# Patient Record
Sex: Male | Born: 1960 | Race: White | Hispanic: No | Marital: Married | Smoking: Current every day smoker
Health system: Southern US, Community
[De-identification: ages and names within clinical notes are randomized; demographics above are authoritative.]

## PROBLEM LIST (undated history)

## (undated) DIAGNOSIS — G8929 Other chronic pain: Secondary | ICD-10-CM

## (undated) DIAGNOSIS — F419 Anxiety disorder, unspecified: Secondary | ICD-10-CM

## (undated) DIAGNOSIS — M519 Unspecified thoracic, thoracolumbar and lumbosacral intervertebral disc disorder: Secondary | ICD-10-CM

## (undated) DIAGNOSIS — I10 Essential (primary) hypertension: Secondary | ICD-10-CM

## (undated) DIAGNOSIS — E785 Hyperlipidemia, unspecified: Secondary | ICD-10-CM

## (undated) DIAGNOSIS — F17210 Nicotine dependence, cigarettes, uncomplicated: Secondary | ICD-10-CM

## (undated) DIAGNOSIS — Z95828 Presence of other vascular implants and grafts: Secondary | ICD-10-CM

## (undated) DIAGNOSIS — M545 Low back pain, unspecified: Secondary | ICD-10-CM

## (undated) DIAGNOSIS — I739 Peripheral vascular disease, unspecified: Secondary | ICD-10-CM

## (undated) DIAGNOSIS — C801 Malignant (primary) neoplasm, unspecified: Secondary | ICD-10-CM

## (undated) DIAGNOSIS — C159 Malignant neoplasm of esophagus, unspecified: Secondary | ICD-10-CM

## (undated) DIAGNOSIS — I779 Disorder of arteries and arterioles, unspecified: Secondary | ICD-10-CM

## (undated) DIAGNOSIS — J449 Chronic obstructive pulmonary disease, unspecified: Secondary | ICD-10-CM

## (undated) DIAGNOSIS — R9439 Abnormal result of other cardiovascular function study: Secondary | ICD-10-CM

## (undated) DIAGNOSIS — Z9109 Other allergy status, other than to drugs and biological substances: Secondary | ICD-10-CM

## (undated) DIAGNOSIS — M549 Dorsalgia, unspecified: Secondary | ICD-10-CM

## (undated) HISTORY — PX: THORACIC ESOPHAGUS REPLACEMENT: SHX2500

## (undated) HISTORY — DX: Nicotine dependence, cigarettes, uncomplicated: F17.210

## (undated) HISTORY — DX: Disorder of arteries and arterioles, unspecified: I77.9

## (undated) HISTORY — PX: CORONARY STENT PLACEMENT: SHX1402

## (undated) HISTORY — DX: Hyperlipidemia, unspecified: E78.5

## (undated) HISTORY — PX: SURGERY SCROTAL / TESTICULAR: SUR1316

## (undated) HISTORY — DX: Abnormal result of other cardiovascular function study: R94.39

## (undated) HISTORY — DX: Dorsalgia, unspecified: M54.9

## (undated) HISTORY — DX: Peripheral vascular disease, unspecified: I73.9

## (undated) HISTORY — DX: Anxiety disorder, unspecified: F41.9

## (undated) HISTORY — DX: Essential (primary) hypertension: I10

## (undated) HISTORY — DX: Other chronic pain: G89.29

---

## 2002-01-02 ENCOUNTER — Ambulatory Visit (HOSPITAL_COMMUNITY): Admission: RE | Admit: 2002-01-02 | Discharge: 2002-01-02 | Payer: Self-pay | Admitting: Family Medicine

## 2002-01-02 ENCOUNTER — Encounter: Payer: Self-pay | Admitting: Family Medicine

## 2004-04-15 ENCOUNTER — Ambulatory Visit (HOSPITAL_COMMUNITY): Admission: RE | Admit: 2004-04-15 | Discharge: 2004-04-15 | Payer: Self-pay | Admitting: Family Medicine

## 2004-04-25 ENCOUNTER — Ambulatory Visit (HOSPITAL_COMMUNITY): Admission: RE | Admit: 2004-04-25 | Discharge: 2004-04-25 | Payer: Self-pay | Admitting: Family Medicine

## 2004-06-15 ENCOUNTER — Other Ambulatory Visit: Admission: RE | Admit: 2004-06-15 | Discharge: 2004-06-15 | Payer: Self-pay | Admitting: General Surgery

## 2004-10-21 ENCOUNTER — Ambulatory Visit (HOSPITAL_COMMUNITY): Admission: RE | Admit: 2004-10-21 | Discharge: 2004-10-21 | Payer: Self-pay | Admitting: Neurosurgery

## 2004-11-30 ENCOUNTER — Encounter: Admission: RE | Admit: 2004-11-30 | Discharge: 2004-11-30 | Payer: Self-pay | Admitting: Neurosurgery

## 2004-12-29 ENCOUNTER — Encounter: Admission: RE | Admit: 2004-12-29 | Discharge: 2004-12-29 | Payer: Self-pay | Admitting: Neurosurgery

## 2006-07-08 ENCOUNTER — Emergency Department (HOSPITAL_COMMUNITY): Admission: EM | Admit: 2006-07-08 | Discharge: 2006-07-08 | Payer: Self-pay | Admitting: Emergency Medicine

## 2006-11-05 ENCOUNTER — Ambulatory Visit (HOSPITAL_COMMUNITY): Admission: RE | Admit: 2006-11-05 | Discharge: 2006-11-05 | Payer: Self-pay | Admitting: Family Medicine

## 2007-02-11 ENCOUNTER — Emergency Department (HOSPITAL_COMMUNITY): Admission: EM | Admit: 2007-02-11 | Discharge: 2007-02-11 | Payer: Self-pay | Admitting: Emergency Medicine

## 2007-03-11 ENCOUNTER — Ambulatory Visit (HOSPITAL_COMMUNITY): Admission: RE | Admit: 2007-03-11 | Discharge: 2007-03-11 | Payer: Self-pay | Admitting: Family Medicine

## 2007-12-20 ENCOUNTER — Ambulatory Visit (HOSPITAL_COMMUNITY): Admission: RE | Admit: 2007-12-20 | Discharge: 2007-12-20 | Payer: Self-pay | Admitting: Family Medicine

## 2008-02-25 HISTORY — PX: LUMBAR MICRODISCECTOMY: SHX99

## 2008-02-25 HISTORY — PX: BACK SURGERY: SHX140

## 2008-03-18 ENCOUNTER — Ambulatory Visit (HOSPITAL_COMMUNITY): Admission: RE | Admit: 2008-03-18 | Discharge: 2008-03-18 | Payer: Self-pay | Admitting: Neurosurgery

## 2008-04-09 ENCOUNTER — Ambulatory Visit (HOSPITAL_COMMUNITY): Admission: RE | Admit: 2008-04-09 | Discharge: 2008-04-09 | Payer: Self-pay | Admitting: Neurosurgery

## 2008-10-29 ENCOUNTER — Ambulatory Visit (HOSPITAL_COMMUNITY): Admission: RE | Admit: 2008-10-29 | Discharge: 2008-10-29 | Payer: Self-pay | Admitting: Family Medicine

## 2009-07-01 ENCOUNTER — Ambulatory Visit (HOSPITAL_COMMUNITY): Admission: RE | Admit: 2009-07-01 | Discharge: 2009-07-01 | Payer: Self-pay | Admitting: Family Medicine

## 2009-07-12 ENCOUNTER — Ambulatory Visit (HOSPITAL_COMMUNITY): Admission: RE | Admit: 2009-07-12 | Discharge: 2009-07-12 | Payer: Self-pay | Admitting: Family Medicine

## 2009-07-27 HISTORY — PX: NM MYOCAR PERF WALL MOTION: HXRAD629

## 2010-03-17 ENCOUNTER — Ambulatory Visit (HOSPITAL_COMMUNITY): Admission: RE | Admit: 2010-03-17 | Discharge: 2010-03-17 | Payer: Self-pay | Admitting: Urology

## 2010-04-28 ENCOUNTER — Ambulatory Visit (HOSPITAL_COMMUNITY): Admission: RE | Admit: 2010-04-28 | Discharge: 2010-04-28 | Payer: Self-pay | Admitting: Urology

## 2010-09-08 LAB — BASIC METABOLIC PANEL
BUN: 7 mg/dL (ref 6–23)
Creatinine, Ser: 0.93 mg/dL (ref 0.4–1.5)
GFR calc non Af Amer: >60 mL/min (ref >60)
Glucose, Bld: 102 mg/dL — ABNORMAL HIGH (ref 70–99)
Potassium: 4.6 mEq/L (ref 3.5–5.1)

## 2010-09-08 LAB — SURGICAL PCR SCREEN: MRSA, PCR: NEGATIVE

## 2010-11-08 NOTE — Op Note (Signed)
NAME:  Jimmy Tucker, Jimmy Tucker                 ACCOUNT NO.:  [id number]   MEDICAL RECORD NO.:  [id number]          PATIENT TYPE:  OIB   LOCATION:  3524                         FACILITY:  [facility]   PHYSICIAN:  Coletta Memos, M.D.     DATE OF BIRTH:  1960-07-22   DATE OF PROCEDURE:  DATE OF DISCHARGE:  03/18/2008                               OPERATIVE REPORT   PREOPERATIVE DIAGNOSIS:  Displaced disk left far-lateral position L4-5,  left L4 radiculopathy.   POSTOPERATIVE DIAGNOSIS:  Displaced disk left far-lateral position L4-5,  left L4 radiculopathy.   PROCEDURE:  Left far-lateral diskectomy with microdissection L4-5.   COMPLICATIONS:  None.   SURGEON:  Coletta Memos, MD   ASSISTANT:  Hewitt Shorts, MD   ANESTHESIA:  General endotracheal.   INDICATIONS:  Mr. Jimmy Tucker is a gentleman who has had pain in his left  lower extremity.  He is undergone nerve blocks, nerve root injections,  and conservative treatment.  He has had continued pain without relief.  Therefore, I offered he agreed to undergo operative decompression.   OPERATIVE NOTE:  Mr. Jimmy Tucker was brought to the operating room, intubated,  and placed under general anesthetic without difficulty.  He was rolled  prone onto a Wilson frame and all pressure points were properly padded.  His back was prepped and he was draped in a sterile fashion.  I  infiltrated 10 mL 0.5% lidocaine with 1:200,000 strength epinephrine  into the lumbar region.  I opened the skin with a #10 blade and took  this down to the thoracolumbar fascia.  I then expose to lamina.  I  placed the Penfield #4 dissector inferior to the most caudal lamina that  I had exposed.  That was actually L5-S1.  I moved up one disk space and  then proceeded to expose the pars interarticularis of L4 on the left  side.  Having done that, I used the high-speed drill to drill out the  lateral portion of the pars.  I then removed the rest of bone with  Kerrison punches and I was able to  get underneath the ligamentum flavum  with Kerrison punch.  With the microscope now position, I was able to  with microdissection identify and retract the L4 nerve root in a rostral  direction.  There was a large ridge at the most proximal portion of the  foramen.  With Dr. Earl Gala assistance, we took down some of this  ridge, though not that much, but enough that there was clearly no  pressure left on the nerve root when we were done at that area.  I also  explored the disk space, but did not find a large fragment or a free  fragment in that space.  Nevertheless, we made sure to fully  decompressed the nerve root and the neuroforamen.  Once that was done, I  did infiltrate fentanyl and steroid over the dorsal root ganglion.  I  then closed the wound in layered fashion using Vicryl sutures.          ______________________________  Coletta Memos, M.D.  KC/MEDQ  D:  03/18/2008  T:  03/19/2008  Job:  [id number]

## 2011-03-27 LAB — CBC
HCT: 43.7
Hemoglobin: 15.1
RDW: 13.4
WBC: 5.9

## 2011-06-13 ENCOUNTER — Telehealth: Payer: Self-pay

## 2011-06-13 NOTE — Telephone Encounter (Signed)
LMOM on 06/09/2011 for a return call. Letter mailed today to call.

## 2011-06-14 ENCOUNTER — Ambulatory Visit: Payer: Self-pay | Admitting: Gastroenterology

## 2011-06-22 ENCOUNTER — Encounter: Payer: Self-pay | Admitting: General Practice

## 2011-07-03 ENCOUNTER — Ambulatory Visit: Payer: Self-pay | Admitting: Gastroenterology

## 2011-07-06 ENCOUNTER — Encounter: Payer: Self-pay | Admitting: Gastroenterology

## 2011-07-06 ENCOUNTER — Ambulatory Visit (INDEPENDENT_AMBULATORY_CARE_PROVIDER_SITE_OTHER): Payer: BC Managed Care – PPO | Admitting: Gastroenterology

## 2011-07-06 VITALS — BP 132/86 | HR 93 | Temp 97.8°F | Ht 66.0 in | Wt 159.4 lb

## 2011-07-06 DIAGNOSIS — Z1211 Encounter for screening for malignant neoplasm of colon: Secondary | ICD-10-CM

## 2011-07-06 MED ORDER — PEG-KCL-NACL-NASULF-NA ASC-C 100 G PO SOLR: 1.0000 | Freq: Once | ORAL | Status: DC

## 2011-07-06 MED ORDER — PROMETHAZINE HCL 25 MG/ML IJ SOLN: 25.0000 mg | Freq: Once | INTRAMUSCULAR | Status: DC

## 2011-07-06 NOTE — Progress Notes (Signed)
Referring Provider: Dr. Regino Schultze Primary Care Physician:  Kirk Ruths, MD Primary Gastroenterologist:  Dr. Jena Gauss  Chief Complaint  Patient presents with  . Colonoscopy    HPI:   51 year old male who presents in consult prior to initial screening colonoscopy. On chronic narcotics secondary to chronic back pain. Has noted small amount of paper hematochezia in the past. Reports "knot" around rectum prior to Christmas, given Proctosol by PCP with resolution. Denies any rectal pain or pruritis. Denies abdominal pain, N/V, change in weight, loss of appetite. Has occasional constipation and takes Miralax with good results.  No upper GI symptoms.  Past Medical History  Diagnosis Date  . Chronic back pain   . Hypertension   . Anxiety     Past Surgical History  Procedure Date  . Back surgery   . Left testicle cyst removal     Current Outpatient Prescriptions  Medication Sig Dispense Refill  . ALPRAZolam (XANAX) 1 MG tablet Take 1 mg by mouth at bedtime as needed.       Marland Kitchen losartan (COZAAR) 50 MG tablet Take 50 mg by mouth daily.       Marland Kitchen oxyCODONE-acetaminophen (PERCOCET) 10-325 MG per tablet Take 1 tablet by mouth every 4 (four) hours as needed.       Marland Kitchen PROVENTIL HFA 108 (90 BASE) MCG/ACT inhaler Inhale 1 puff into the lungs 4 (four) times daily.       . peg 3350 powder (MOVIPREP) 100 G SOLR Take 1 kit (100 g total) by mouth once. As directed Please purchase 1 Fleets enema to use with the prep  1 kit  0  . PROCTOSOL HC 2.5 % rectal cream Place 1 application rectally 2 (two) times daily.        Current Facility-Administered Medications  Medication Dose Route Frequency Provider Last Rate Last Dose  . promethazine (PHENERGAN) injection 25 mg  25 mg Intravenous Once Corbin Ade, MD        Allergies as of 07/06/2011  . (No Known Allergies)    Family History  Problem Relation Age of Onset  . Colon cancer Neg Hx     History   Social History  . Marital Status: Married   Spouse Name: N/A    Number of Children: N/A  . Years of Education: N/A   Occupational History  . [employer]      in [place]   Social History Main Topics  . Smoking status: Current Everyday Smoker -- 1.0 packs/day    Types: Cigarettes  . Smokeless tobacco: Not on file  . Alcohol Use: Yes     every day   . Drug Use: No  . Sexually Active: Not on file   Other Topics Concern  . Not on file   Social History Narrative  . No narrative on file    Review of Systems: Gen: Denies any fever, chills, loss of appetite, fatigue, weight loss. CV: Denies chest pain, heart palpitations, syncope, peripheral edema. Resp: Denies shortness of breath with rest, cough, wheezing GI: Denies dysphagia or odynophagia. Denies hematemesis, fecal incontinence, or jaundice.  GU : Denies urinary burning, urinary frequency, urinary incontinence.  MS: Denies joint pain, muscle weakness, cramps, limited movement Derm: Denies rash, itching, dry skin Psych: Denies depression, anxiety, confusion or memory loss  Heme: Denies bruising, bleeding, and enlarged lymph nodes.  Physical Exam: BP 132/86  Pulse 93  Temp(Src) 97.8 F (36.6 C) (Temporal)  Ht 5\' 6"  (1.676 m)  Wt 159 lb 6.4 oz (72.303  kg)  BMI 25.73 kg/m2 General:   Alert and oriented. Well-developed, well-nourished, pleasant and cooperative. Head:  Normocephalic and atraumatic. Eyes:  Conjunctiva pink, sclera clear, no icterus.   Conjunctiva pink. Ears:  Normal auditory acuity. Nose:  No deformity, discharge,  or lesions. Mouth:  No deformity or lesions, mucosa pink and moist.  Neck:  Supple, without mass or thyromegaly. Lungs:  Inspiratory and expiratory mild wheezing noted, diminished bases Heart:  S1, S2 present without murmurs noted.  Abdomen:  +BS, soft, non-tender and non-distended. Without mass or HSM. No rebound or guarding. Question small umbilical hernia Rectal:  Deferred  Msk:  Symmetrical without gross deformities. Normal  posture. Extremities:  Without clubbing or edema. Neurologic:  Alert and  oriented x4;  grossly normal neurologically. Skin:  Intact, warm and dry without significant lesions or rashes Cervical Nodes:  No significant cervical adenopathy. Psych:  Alert and cooperative. Normal mood and affect.

## 2011-07-06 NOTE — Patient Instructions (Signed)
We have set you up for a colonoscopy with Dr. Jena Gauss in the near future.  We have also included a letter for work.

## 2011-07-07 DIAGNOSIS — Z1211 Encounter for screening for malignant neoplasm of colon: Secondary | ICD-10-CM | POA: Insufficient documentation

## 2011-07-07 NOTE — Assessment & Plan Note (Signed)
51 year old male with need for initial screening colonoscopy. Noted intermittent paper hematochezia in past. Possible hemorrhoid flare treated prior to Christmas by PCP with good results. No further issues. Notes intermittent constipation, relieved by Miralax. Likely hematochezia due to benign anorectal source. As of note, hx of chronic narcotics secondary to chronic back pain. Also works at a [employer] and drinks daily. Question if needs to be done with Propofol to facilitate adequate sedation. For now, will proceed with scheduling in endo with Phenergan 25 mg IV prior to procedure. Will discuss with Dr. Jena Gauss need for Propofol.  Proceed with TCS with Dr. Jena Gauss in near future: the risks, benefits, and alternatives have been discussed with the patient in detail. The patient states understanding and desires to proceed. High fiber diet handout Miralax prn

## 2011-07-10 NOTE — Progress Notes (Signed)
Faxed to PCP

## 2011-07-18 ENCOUNTER — Encounter (HOSPITAL_COMMUNITY): Payer: Self-pay | Admitting: Pharmacy Technician

## 2011-07-21 MED ORDER — SODIUM CHLORIDE 0.45 % IV SOLN
Freq: Once | INTRAVENOUS | Status: AC
  Administered 2011-07-24: 08:00:00 via INTRAVENOUS

## 2011-07-24 ENCOUNTER — Ambulatory Visit (HOSPITAL_COMMUNITY)
Admission: RE | Admit: 2011-07-24 | Discharge: 2011-07-24 | Disposition: A | Discharge status: Home / Self Care | Payer: BC Managed Care – PPO | Source: Ambulatory Visit | Attending: Internal Medicine | Admitting: Internal Medicine

## 2011-07-24 ENCOUNTER — Encounter (HOSPITAL_COMMUNITY): Payer: Self-pay | Admitting: *Deleted

## 2011-07-24 ENCOUNTER — Encounter (HOSPITAL_COMMUNITY)
Admission: RE | Disposition: A | Discharge status: Home / Self Care | Payer: Self-pay | Source: Ambulatory Visit | Attending: Internal Medicine

## 2011-07-24 DIAGNOSIS — I10 Essential (primary) hypertension: Secondary | ICD-10-CM | POA: Insufficient documentation

## 2011-07-24 DIAGNOSIS — K5730 Diverticulosis of large intestine without perforation or abscess without bleeding: Secondary | ICD-10-CM

## 2011-07-24 DIAGNOSIS — K921 Melena: Secondary | ICD-10-CM

## 2011-07-24 DIAGNOSIS — Z79899 Other long term (current) drug therapy: Secondary | ICD-10-CM | POA: Insufficient documentation

## 2011-07-24 DIAGNOSIS — K648 Other hemorrhoids: Secondary | ICD-10-CM

## 2011-07-24 DIAGNOSIS — Z1211 Encounter for screening for malignant neoplasm of colon: Secondary | ICD-10-CM

## 2011-07-24 HISTORY — PX: COLONOSCOPY: SHX5424

## 2011-07-24 SURGERY — COLONOSCOPY
Anesthesia: Moderate Sedation

## 2011-07-24 MED ORDER — MEPERIDINE HCL 100 MG/ML IJ SOLN
INTRAMUSCULAR | Status: DC | PRN
  Administered 2011-07-24 (×2): 50 mg via INTRAVENOUS
  Administered 2011-07-24: 25 mg via INTRAVENOUS
  Administered 2011-07-24: 50 mg via INTRAVENOUS

## 2011-07-24 MED ORDER — STERILE WATER FOR IRRIGATION IR SOLN
Status: DC | PRN
  Administered 2011-07-24: 09:00:00

## 2011-07-24 MED ORDER — PROMETHAZINE HCL 25 MG/ML IJ SOLN
25.0000 mg | Freq: Once | INTRAMUSCULAR | Status: AC
  Administered 2011-07-24: 25 mg via INTRAVENOUS

## 2011-07-24 MED ORDER — MIDAZOLAM HCL 5 MG/5ML IJ SOLN
INTRAMUSCULAR | Status: DC | PRN
  Administered 2011-07-24 (×3): 2 mg via INTRAVENOUS
  Administered 2011-07-24 (×2): 1 mg via INTRAVENOUS
  Administered 2011-07-24: 2 mg via INTRAVENOUS

## 2011-07-24 MED ORDER — SODIUM CHLORIDE 0.9 % IJ SOLN
INTRAMUSCULAR | Status: AC
  Filled 2011-07-24: qty 10

## 2011-07-24 MED ORDER — PROMETHAZINE HCL 25 MG/ML IJ SOLN
INTRAMUSCULAR | Status: AC
  Filled 2011-07-24: qty 1

## 2011-07-24 MED ORDER — MEPERIDINE HCL 100 MG/ML IJ SOLN
INTRAMUSCULAR | Status: AC
  Filled 2011-07-24: qty 2

## 2011-07-24 MED ORDER — MIDAZOLAM HCL 5 MG/5ML IJ SOLN
INTRAMUSCULAR | Status: AC
  Filled 2011-07-24: qty 10

## 2011-07-24 NOTE — H&P (Signed)
  I have seen & examined the patient prior to the procedure(s) today and reviewed the history and physical/consultation.  There have been no changes.  After consideration of the risks, benefits, alternatives and imponderables, the patient has consented to the procedure(s).   

## 2011-07-24 NOTE — Op Note (Signed)
[facility] [address] [place], [place]  [address]  COLONOSCOPY PROCEDURE REPORT  PATIENT:  Jimmy Tucker, Jimmy Tucker  MR#:  [id number] BIRTHDATE:  March 18, 1961, 50 yrs. old  GENDER:  male ENDOSCOPIST:  R. Roetta Sessions, MD FACP [facility] REF. BY:  Karleen Hampshire, M.D. PROCEDURE DATE:  07/24/2011 PROCEDURE:  Diagnostic colonoscopy  INDICATIONS:  Hematochezia; no prior colonoscopy.  INFORMED CONSENT:  The risks, benefits, alternatives and imponderables including but not limited to bleeding, perforation as well as the possibility of a missed lesion have been reviewed. The potential for biopsy, lesion removal, etc. have also been discussed.  Questions have been answered.  All parties agreeable. Please see the history and physical in the medical record for more information.  MEDICATIONS:  Versed 10 mg IV and Demerol 175 mg IV in divided doses. Phenergan 25 mg IV diluted to augment conscious sedation  DESCRIPTION OF PROCEDURE:  After a digital rectal exam was performed, the EC-3890Li ([id number]) colonoscope was advanced from the anus through the rectum and colon to the area of the cecum, ileocecal valve and appendiceal orifice.  The cecum was deeply intubated.  These structures were well-seen and photographed for the record.  From the level of the cecum and ileocecal valve, the scope was slowly and cautiously withdrawn.  The mucosal surfaces were carefully surveyed utilizing scope tip deflection to facilitate fold flattening as needed.  The scope was pulled down into the rectum where a thorough examination including retroflexion was performed. <<PROCEDUREIMAGES>>  FINDINGS: Good preparation. Pancolonic diverticulosis; remainder of colonic mucosa appeared normal. Internal hemorrhoids; otherwise normal rectum.  THERAPEUTIC / DIAGNOSTIC MANEUVERS PERFORMED: None  COMPLICATIONS:  None  CECAL WITHDRAWAL TIME:  9 minutes  IMPRESSION:     Colonic diverticulosis and  internal hemorrhoids-latter likely responsible for paper hematochezia  RECOMMENDATIONS:   Daily fiber supplement. Screening colonoscopy in 10 years.  ______________________________ R. Roetta Sessions, MD Caleen Essex  CC:  Karleen Hampshire, M.D.  n. eSIGNED:   R. Casimiro Needle Rourk at 07/24/2011 09:10 AM  Jimmy Tucker, [place], [id number]

## 2011-07-31 ENCOUNTER — Encounter (HOSPITAL_COMMUNITY): Payer: Self-pay | Admitting: Internal Medicine

## 2011-08-11 NOTE — Interval H&P Note (Signed)
History and Physical Interval Note:  08/11/2011 8:17 AM  Jimmy Tucker  has presented today for surgery, with the diagnosis of screening, hemachesia  The various methods of treatment have been discussed with the patient and family. After consideration of risks, benefits and other options for treatment, the patient has consented to  Procedure(s) (LRB): COLONOSCOPY (N/A) as a surgical intervention .  The patients' history has been reviewed, patient examined, no change in status, stable for surgery.  I have reviewed the patients' chart and labs.  Questions were answered to the patient's satisfaction.     [name]

## 2011-08-11 NOTE — Interval H&P Note (Signed)
History and Physical Interval Note:  08/11/2011 8:19 AM  Jimmy Tucker  has presented today for surgery, with the diagnosis of screening, hemachesia  The various methods of treatment have been discussed with the patient and family. After consideration of risks, benefits and other options for treatment, the patient has consented to  Procedure(s) (LRB): COLONOSCOPY (N/A) as a surgical intervention .  The patients' history has been reviewed, patient examined, no change in status, stable for surgery.  I have reviewed the patients' chart and labs.  Questions were answered to the patient's satisfaction.     [name]

## 2011-12-18 ENCOUNTER — Other Ambulatory Visit (HOSPITAL_COMMUNITY): Payer: Self-pay | Admitting: Family Medicine

## 2011-12-18 DIAGNOSIS — M549 Dorsalgia, unspecified: Secondary | ICD-10-CM

## 2011-12-20 ENCOUNTER — Ambulatory Visit (HOSPITAL_COMMUNITY): Payer: BC Managed Care – PPO

## 2011-12-25 ENCOUNTER — Ambulatory Visit (HOSPITAL_COMMUNITY)
Admission: RE | Admit: 2011-12-25 | Discharge: 2011-12-25 | Disposition: A | Discharge status: Home / Self Care | Payer: BC Managed Care – PPO | Source: Ambulatory Visit | Attending: Family Medicine | Admitting: Family Medicine

## 2011-12-25 DIAGNOSIS — M545 Low back pain, unspecified: Secondary | ICD-10-CM | POA: Insufficient documentation

## 2011-12-25 DIAGNOSIS — M549 Dorsalgia, unspecified: Secondary | ICD-10-CM

## 2011-12-25 DIAGNOSIS — M5126 Other intervertebral disc displacement, lumbar region: Secondary | ICD-10-CM | POA: Insufficient documentation

## 2012-10-30 DIAGNOSIS — I739 Peripheral vascular disease, unspecified: Secondary | ICD-10-CM

## 2012-10-30 HISTORY — DX: Peripheral vascular disease, unspecified: I73.9

## 2013-08-11 ENCOUNTER — Other Ambulatory Visit (HOSPITAL_COMMUNITY): Payer: Self-pay | Admitting: Family Medicine

## 2013-08-11 DIAGNOSIS — M549 Dorsalgia, unspecified: Secondary | ICD-10-CM

## 2013-08-13 ENCOUNTER — Ambulatory Visit (HOSPITAL_COMMUNITY): Payer: BC Managed Care – PPO

## 2013-08-14 ENCOUNTER — Ambulatory Visit (HOSPITAL_COMMUNITY)
Admission: RE | Admit: 2013-08-14 | Discharge: 2013-08-14 | Disposition: A | Discharge status: Home / Self Care | Payer: BC Managed Care – PPO | Source: Ambulatory Visit | Attending: Family Medicine | Admitting: Family Medicine

## 2013-08-14 DIAGNOSIS — M545 Low back pain, unspecified: Secondary | ICD-10-CM | POA: Insufficient documentation

## 2013-08-14 DIAGNOSIS — M79609 Pain in unspecified limb: Secondary | ICD-10-CM | POA: Insufficient documentation

## 2013-08-14 DIAGNOSIS — M539 Dorsopathy, unspecified: Secondary | ICD-10-CM | POA: Insufficient documentation

## 2013-08-14 DIAGNOSIS — M549 Dorsalgia, unspecified: Secondary | ICD-10-CM

## 2013-08-14 DIAGNOSIS — M5126 Other intervertebral disc displacement, lumbar region: Secondary | ICD-10-CM | POA: Insufficient documentation

## 2013-08-28 ENCOUNTER — Other Ambulatory Visit (HOSPITAL_COMMUNITY): Payer: Self-pay | Admitting: Physician Assistant

## 2013-08-28 DIAGNOSIS — M25579 Pain in unspecified ankle and joints of unspecified foot: Secondary | ICD-10-CM

## 2013-08-28 DIAGNOSIS — L02818 Cutaneous abscess of other sites: Secondary | ICD-10-CM

## 2013-08-28 DIAGNOSIS — L03818 Cellulitis of other sites: Secondary | ICD-10-CM

## 2013-09-02 ENCOUNTER — Ambulatory Visit (HOSPITAL_COMMUNITY)
Admission: RE | Admit: 2013-09-02 | Discharge: 2013-09-02 | Disposition: A | Discharge status: Home / Self Care | Payer: BC Managed Care – PPO | Source: Ambulatory Visit | Attending: Physician Assistant | Admitting: Physician Assistant

## 2013-09-02 DIAGNOSIS — M19079 Primary osteoarthritis, unspecified ankle and foot: Secondary | ICD-10-CM | POA: Insufficient documentation

## 2013-09-02 DIAGNOSIS — L03818 Cellulitis of other sites: Secondary | ICD-10-CM

## 2013-09-02 DIAGNOSIS — M25579 Pain in unspecified ankle and joints of unspecified foot: Secondary | ICD-10-CM

## 2013-09-02 DIAGNOSIS — L02818 Cutaneous abscess of other sites: Secondary | ICD-10-CM

## 2013-10-23 ENCOUNTER — Ambulatory Visit: Payer: Self-pay | Admitting: Podiatry

## 2013-10-24 ENCOUNTER — Ambulatory Visit (INDEPENDENT_AMBULATORY_CARE_PROVIDER_SITE_OTHER): Payer: BC Managed Care – PPO

## 2013-10-24 VITALS — BP 115/73 | HR 74 | Temp 98.3°F | Resp 16

## 2013-10-24 DIAGNOSIS — I739 Peripheral vascular disease, unspecified: Secondary | ICD-10-CM

## 2013-10-24 DIAGNOSIS — M79606 Pain in leg, unspecified: Secondary | ICD-10-CM

## 2013-10-24 DIAGNOSIS — M79609 Pain in unspecified limb: Secondary | ICD-10-CM

## 2013-10-24 DIAGNOSIS — G629 Polyneuropathy, unspecified: Secondary | ICD-10-CM

## 2013-10-24 DIAGNOSIS — G609 Hereditary and idiopathic neuropathy, unspecified: Secondary | ICD-10-CM

## 2013-10-24 DIAGNOSIS — L97509 Non-pressure chronic ulcer of other part of unspecified foot with unspecified severity: Secondary | ICD-10-CM

## 2013-10-24 NOTE — Progress Notes (Signed)
Subjective:    Patient ID: Jimmy Tucker, male    DOB: [birth year], 53 y.o.   MRN: [id number]  HPI Comments: "I have a problem with this left foot."  Patient c/o sharp, stabbing, numbness forefoot and toes left foot for about 4 months. The pain started initially and was thought it was coming from his back. Then the ulcerated areas started to appear. The areas are red and swollen. Can't walk without limping or wear regular shoes. Today, patient is in slippers. The big toe left has some drainage coming from under the toenail. The other wounds on the medial 1st left stays scabbed over. The wound lateral 5th MPJ left scabs and opens and drains, off and on. He has seen 4 different doctors-been on prednisone, couple antibiotics, soaking-has only gotten worse throughout all the treatment plans he's been on.  Foot Pain      Review of Systems  Musculoskeletal: Positive for back pain and gait problem.  Skin: Positive for wound.  All other systems reviewed and are negative.      Objective:   Physical Exam 53 year old white male presents at this time well-nourished oriented well-developed oriented x3. Patient's complaint of severe left foot and leg pain with spontaneous ulcerations or sores or happened on the hallux second digit and lateral fifth MTP area and some transient discharge the foot is been red swollen the past no suspicion of osteomyelitis which was ruled out with MRI. Patient these have severe ischemic pain of left lower extremity patient does have atrophy of the left leg secondary to neuropathy or peripheral neuropathy is having back surgery with resultant weakness abnormal sensations in loss of function on the left side. J objective findings as follows vascular status pedal pulses DP and PT plus one over 4 on the right side. Left leg is absent pedal pulses dorsalis pedis and PT capillary refill time 3 seconds on the right 56 seconds on the left. Temperature is warm to cool on the left side  warm to warm on the right the toes on the right her pain to the toes on the left foot show somewhat ischemic changes lack of pink color is noted patient does describe rest pain or night pain it wakes him from sleep has difficulty with any walking severity of pain the pain or maybe both neuropathic as well as ischemic. Neurologically epicritic sensations diminished on Semmes Weinstein testing to the left forefoot digits has no plantar response and DTRs not elicited there is exquisite pain tenderness on palpation of the digits to the forefoot, + edema noted left forefoot right foot unremarkable rectus foot type otherwise noted hair growth diminished on both feet are much more diminished on the left is absent hair on the toes right foot still has hair growth on his toes. Patient had previous ask your studies done several years ago with Dr. Donnella Bi however uncertain as what the results were and it may be several years for her 3 or 4 years or more since those were done. Orthopedic biomechanical exam reveals relatively rectus foot type no new x-rays taken at this time MRI was reviewed again revealing no abnormal bony edema in the toes are forefoot oriented barefoot significant arthropathy is noted otherwise unremarkable       Assessment & Plan:  Assessment this time is significant peripheral neuropathy affecting his left side as well as likely angiopathy or peripheral arterial disease PAD is identified this time lack of palpable pulse cool temperature ischemic rest pain lack  of hair growth and trophic changes with spontaneous ulceration of the foot. The wounds appear to be clean dry with slight eschar recommended just keeping couple cotton sock ankle. Soft tissues in slippers followup in 3-4 weeks once the results of the vascular studies are available for review with patient  Harriet Masson DPM

## 2013-10-24 NOTE — Patient Instructions (Signed)
Peripheral Vascular Disease Peripheral Vascular Disease (PVD), also called Peripheral Arterial Disease (PAD), is a circulation problem caused by cholesterol (atherosclerotic plaque) deposits in the arteries. PVD commonly occurs in the lower extremities (legs) but it can occur in other areas of the body, such as your arms. The cholesterol buildup in the arteries reduces blood flow which can cause pain and other serious problems. The presence of PVD can place a person at risk for Coronary Artery Disease (CAD).  CAUSES  Causes of PVD can be many. It is usually associated with more than one risk factor such as:   High Cholesterol.  Smoking.  Diabetes.  Lack of exercise or inactivity.  High blood pressure (hypertension).  Obesity.  Family history. SYMPTOMS   When the lower extremities are affected, patients with PVD may experience:  Leg pain with exertion or physical activity. This is called INTERMITTENT CLAUDICATION. This may present as cramping or numbness with physical activity. The location of the pain is associated with the level of blockage. For example, blockage at the abdominal level (distal abdominal aorta) may result in buttock or hip pain. Lower leg arterial blockage may result in calf pain.  As PVD becomes more severe, pain can develop with less physical activity.  In people with severe PVD, leg pain may occur at rest.  Other PVD signs and symptoms:  Leg numbness or weakness.  Coldness in the affected leg or foot, especially when compared to the other leg.  A change in leg color.  Patients with significant PVD are more prone to ulcers or sores on toes, feet or legs. These may take longer to heal or may reoccur. The ulcers or sores can become infected.  If signs and symptoms of PVD are ignored, gangrene may occur. This can result in the loss of toes or loss of an entire limb.  Not all leg pain is related to PVD. Other medical conditions can cause leg pain such  as:  Blood clots (embolism) or Deep Vein Thrombosis.  Inflammation of the blood vessels (vasculitis).  Spinal stenosis. DIAGNOSIS  Diagnosis of PVD can involve several different types of tests. These can include:  Pulse Volume Recording Method (PVR). This test is simple, painless and does not involve the use of X-rays. PVR involves measuring and comparing the blood pressure in the arms and legs. An ABI (Ankle-Brachial Index) is calculated. The normal ratio of blood pressures is 1. As this number becomes smaller, it indicates more severe disease.  < 0.95  indicates significant narrowing in one or more leg vessels.  <0.8 there will usually be pain in the foot, leg or buttock with exercise.  <0.4 will usually have pain in the legs at rest.  <0.25  usually indicates limb threatening PVD.  Doppler detection of pulses in the legs. This test is painless and checks to see if you have a pulses in your legs/feet.  A dye or contrast material (a substance that highlights the blood vessels so they show up on x-ray) may be given to help your caregiver better see the arteries for the following tests. The dye is eliminated from your body by the kidney's. Your caregiver may order blood work to check your kidney function and other laboratory values before the following tests are performed:  Magnetic Resonance Angiography (MRA). An MRA is a picture study of the blood vessels and arteries. The MRA machine uses a large magnet to produce images of the blood vessels.  Computed Tomography Angiography (CTA). A CTA is a   specialized x-ray that looks at how the blood flows in your blood vessels. An IV may be inserted into your arm so contrast dye can be injected.  Angiogram. Is a procedure that uses x-rays to look at your blood vessels. This procedure is minimally invasive, meaning a small incision (cut) is made in your groin. A small tube (catheter) is then inserted into the artery of your groin. The catheter is  guided to the blood vessel or artery your caregiver wants to examine. Contrast dye is injected into the catheter. X-rays are then taken of the blood vessel or artery. After the images are obtained, the catheter is taken out. TREATMENT  Treatment of PVD involves many interventions which may include:  Lifestyle changes:  Quitting smoking.  Exercise.  Following a low fat, low cholesterol diet.  Control of diabetes.  Foot care is very important to the PVD patient. Good foot care can help prevent infection.  Medication:  Cholesterol-lowering medicine.  Blood pressure medicine.  Anti-platelet drugs.  Certain medicines may reduce symptoms of Intermittent Claudication.  Interventional/Surgical options:  Angioplasty. An Angioplasty is a procedure that inflates a balloon in the blocked artery. This opens the blocked artery to improve blood flow.  Stent Implant. A wire mesh tube (stent) is placed in the artery. The stent expands and stays in place, allowing the artery to remain open.  Peripheral Bypass Surgery. This is a surgical procedure that reroutes the blood around a blocked artery to help improve blood flow. This type of procedure may be performed if Angioplasty or stent implants are not an option. SEEK IMMEDIATE MEDICAL CARE IF:   You develop pain or numbness in your arms or legs.  Your arm or leg turns cold, becomes blue in color.  You develop redness, warmth, swelling and pain in your arms or legs. MAKE SURE YOU:   Understand these instructions.  Will watch your condition.  Will get help right away if you are not doing well or get worse. Document Released: 07/20/2004 Document Revised: 09/04/2011 Document Reviewed: 06/16/2008 [facility] Patient Information 2014 [place], [place].  Strong recommendation at this time to discontinue smoking which is contributing to the poor circulation

## 2013-10-27 ENCOUNTER — Telehealth (HOSPITAL_COMMUNITY): Payer: Self-pay | Admitting: *Deleted

## 2013-10-30 ENCOUNTER — Ambulatory Visit: Payer: BC Managed Care – PPO | Admitting: Cardiology

## 2013-10-30 ENCOUNTER — Ambulatory Visit (HOSPITAL_COMMUNITY)
Admission: RE | Admit: 2013-10-30 | Discharge: 2013-10-30 | Disposition: A | Discharge status: Home / Self Care | Payer: BC Managed Care – PPO | Source: Ambulatory Visit | Attending: Cardiology | Admitting: Cardiology

## 2013-10-30 ENCOUNTER — Ambulatory Visit (INDEPENDENT_AMBULATORY_CARE_PROVIDER_SITE_OTHER): Payer: BC Managed Care – PPO | Admitting: Cardiology

## 2013-10-30 ENCOUNTER — Other Ambulatory Visit: Payer: Self-pay | Admitting: *Deleted

## 2013-10-30 ENCOUNTER — Encounter (HOSPITAL_COMMUNITY): Payer: Self-pay | Admitting: Pharmacy Technician

## 2013-10-30 ENCOUNTER — Encounter: Payer: Self-pay | Admitting: Cardiology

## 2013-10-30 ENCOUNTER — Telehealth: Payer: Self-pay | Admitting: *Deleted

## 2013-10-30 DIAGNOSIS — R5381 Other malaise: Secondary | ICD-10-CM

## 2013-10-30 DIAGNOSIS — M79606 Pain in leg, unspecified: Secondary | ICD-10-CM

## 2013-10-30 DIAGNOSIS — R5383 Other fatigue: Secondary | ICD-10-CM

## 2013-10-30 DIAGNOSIS — I999 Unspecified disorder of circulatory system: Secondary | ICD-10-CM

## 2013-10-30 DIAGNOSIS — L97509 Non-pressure chronic ulcer of other part of unspecified foot with unspecified severity: Secondary | ICD-10-CM

## 2013-10-30 DIAGNOSIS — I998 Other disorder of circulatory system: Secondary | ICD-10-CM

## 2013-10-30 DIAGNOSIS — M79609 Pain in unspecified limb: Secondary | ICD-10-CM | POA: Insufficient documentation

## 2013-10-30 DIAGNOSIS — I70229 Atherosclerosis of native arteries of extremities with rest pain, unspecified extremity: Secondary | ICD-10-CM | POA: Insufficient documentation

## 2013-10-30 DIAGNOSIS — Z01818 Encounter for other preprocedural examination: Secondary | ICD-10-CM

## 2013-10-30 DIAGNOSIS — I10 Essential (primary) hypertension: Secondary | ICD-10-CM

## 2013-10-30 DIAGNOSIS — F17200 Nicotine dependence, unspecified, uncomplicated: Secondary | ICD-10-CM

## 2013-10-30 DIAGNOSIS — I739 Peripheral vascular disease, unspecified: Secondary | ICD-10-CM | POA: Insufficient documentation

## 2013-10-30 DIAGNOSIS — D689 Coagulation defect, unspecified: Secondary | ICD-10-CM

## 2013-10-30 DIAGNOSIS — Z79899 Other long term (current) drug therapy: Secondary | ICD-10-CM

## 2013-10-30 DIAGNOSIS — E785 Hyperlipidemia, unspecified: Secondary | ICD-10-CM

## 2013-10-30 DIAGNOSIS — F17210 Nicotine dependence, cigarettes, uncomplicated: Secondary | ICD-10-CM

## 2013-10-30 NOTE — Telephone Encounter (Signed)
Patient just had a doppler done he has severe claudication and is ischemic. It's so severe we couldn't even get the ABIs.  Dr. [name] is not here today but would you like for him to go ahead and see another doctor here today.  I told her that it was okay.

## 2013-10-30 NOTE — Progress Notes (Signed)
Arterial Duplex Left Lower Ext. Completed. Oda Cogan, BS, RDMS, RVT

## 2013-10-30 NOTE — Progress Notes (Signed)
PATIENT: Jimmy Tucker MRN: [id number] DOB: January 11, 1961 PCP: Leonides Grills, MD  Clinic Note: Chief Complaint  Patient presents with  . Foot Pain    Significant left leg claudication with foot and toe pain. There is a ulcer on the left great toe    HPI: Jimmy Tucker is a 53 y.o. male with a PMH below who presents today for lower extremity arterial Dopplers to evaluate likely progression of peripheral arterial disease in the left lower extremity.  He had seen Dr. [name] in the old [facility] and [place] prior to moving to a new office. 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 He is a long-term smoker who is reluctant to quit. He was intolerant of statins and is basically just stopped them. He still takes blood pressure medications but has not been seen by Dr. [name] since at least 2011. He is now established with Dr. Orson Ape.  Interval History: He is noted for about the last month or 2 going from one doctor to another doctor to try to treat due to his significant pain in the left foot-leg. It is quite an hour he has a hard time walking. He is a lesion on the left great toe, states occasionally he gets swelling and redness in the foot. Does not feel warm to touch as it is infected. Residuals. He has been treated with steroids and antibiotics and different creams. He finally went to Dr. Blenda Mounts from podiatry who referred him for lower extremity Dopplers. I was actually asked to see the gentleman after our technologist identified significant occlusion of the left common iliac, external iliac into the common femoral and superficial femoral artery. With significant resting foot pain in this leg, I am concerned of possible critical limb ischemia. He has claudication just to be walking around the room, but the foot it is more painful because of a lesion on his toe.  As I mentioned, no active infectious symptoms.  From a cardiac  standpoint, he has not had any symptoms of chest tightness/pressure with rest or exertion. He does get exertional dyspnea and has some occasional wheezing and coughing. His wife indicates that he coughs and wheezes more than she doesn't she's been diagnosed with COPD. He denies any PND, orthopnea. He has had swelling of the left leg/foot but none right. He denies any rapid or irregular heartbeats. No syncope/near-syncope, no TIA or RCA symptoms. No melena, hematochezia or hematuria. *Location symptoms, he does note intermittent hip claudication. Exactly when this started he is not sure but he thinks he must initially thought it was related to his chronic back pain issues this started with hip pain. Fortunately by the time he realized that it was different the pain was going down into his leg and now is going from his foot up.   Past Medical History  Diagnosis Date  . Chronic back pain   . Hypertension   . Anxiety   . Heavy cigarette smoker (20-39 per day)     Unmotivated  . PAD (peripheral artery disease) 10/30/2012    Previous LEA Dopplers in ~2011 demonstrated Left ATA occlusion; Dopplers revealed occlusion of the left common iliac with a short reconstitution at the branch point of the external and internal iliac, the external iliac is occluded through the common femoral and proximal SFA; reconstitutes in the proximal SFA. Two-vessel runoff beyond.  . Hyperlipidemia LDL goal <70     Intolerant to statins  Prior Cardiac Evaluation and Past Surgical History: Past Surgical History  Procedure Laterality Date  . Back surgery    . Left testicle cyst removal    . Colonoscopy  07/24/2011    Procedure: COLONOSCOPY;  Surgeon: Daneil Dolin, MD;  [facility];  Service: Endoscopy;  Laterality: N/A;  8:15    No Known Allergies  Current Outpatient Prescriptions  Medication Sig Dispense Refill  . albuterol (PROAIR HFA) 108 (90 BASE) MCG/ACT inhaler Inhale 2 puffs into the lungs daily as  needed for wheezing or shortness of breath.      . ALPRAZolam (XANAX) 1 MG tablet Take 1 mg by mouth at bedtime as needed for anxiety.       . gabapentin (NEURONTIN) 300 MG capsule Take 300 mg by mouth 3 (three) times daily.      Marland Kitchen losartan (COZAAR) 50 MG tablet Take 25 mg by mouth daily.       . Oxycodone HCl 20 MG TABS Take 20 mg by mouth every 4 (four) hours as needed (pain).       . propranolol (INDERAL) 20 MG tablet Take 20 mg by mouth 2 (two) times daily.        Current Facility-Administered Medications  Medication Dose Route Frequency Provider Last Rate Last Dose  . promethazine (PHENERGAN) injection 25 mg  25 mg Intravenous Once Daneil Dolin, MD        History   Social History Narrative   Married, still smokes about a pack a day. Unable to work currently because of his claudication.   He does work for [employer] in [place], [place].   family history includes Heart attack (age of onset: 52) in his father; Stroke (age of onset: 30) in his brother; Sudden death in his father. There is no history of Colon cancer.  ROS: A comprehensive Review of Systems - Negative except Significant foot pain, claudication. He has somewhat generalized malaise. Frequent chronic cough but not overly productive. 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  PHYSICAL EXAM There were no vitals taken for this visit. -- he was seen in the peripheral vascular Doppler room. No vitals were checked General appearance: alert, cooperative, appears stated age and Somewhat disheveled. He has distinct odor of cigarettes. Unshaven. He is wearing moccasin slippers so that disease he did get his foot into. HEENT: Real/AT, EOMI, MMM, anicteric sclera; poor dentition Neck: no adenopathy, no carotid bruit, no JVD, supple, symmetrical, trachea midline and thyroid not enlarged, symmetric, no tenderness/mass/nodules Lungs: Diffuse historian x-ray wheezes with increased AP diameter and prolonged expiratory  phase. No rales but some rhonchorous inhalations. No crackles. Non-labored despite increased AP diameter. Heart: RRR with normal S1 and S2, soft 1/6 SEM and RUSB but otherwise no R./E. Abdomen: soft, non-tender; bowel sounds normal; no masses,  no organomegaly Extremities: The right lower extremity has mild trophic PAD changes but with warm perfusion. No lesions or wounds. The left lower Sherrod is notable for erythematous changes from the foot up into the ankle. The most therapy and is involving the left great toe. On the lateral aspect there is a half centimeter diameter black eschar indicating a small ulcerative lesion. The foot itself is not cold or tenderness. He can flex the foot some but doing so very painful. Pulses: Diminished 1+ DP and PT pulse on the right, but essentially on palpable DP and PT pulses on the left. Also popliteal pulse on the left are somewhat diminished. Bilateral femoral bruits are  noted. Good femoral pulse the left leg is very difficult to palpate. Skin: Erythematous tight skin changes on the left foot, with bilateral PAD changes with minimal hair Neurologic: Mental status: Alert, oriented, thought content appropriate Cranial nerves: normal  Recent Labs: None  ASSESSMENT / PLAN: 53 year old gentleman with known mild PAD now presenting with essentially critical limb ischemia and Doppler evidence of occluded iliac and common femoral artery and left. The left foot has a clear punctate ulcer, but there is no sign of gangrene or active infection.  He does not have any cardiac symptoms his peak. However he continues to smoke and has clear exam findings to suggest COPD. Basilar fact that he has significant resting pain and intractable claudication symptoms at that he needed to be evaluated with a lower STEMI angiography procedure. We'll set this up for Monday morning first case. He will have preprocedure labs drawn I would suspect that based on the ultrasound findings he may very  well require fem-fem bypass, will defer to Dr. [name] with the angiographic images to determine the best course of action would be.  Critical lower limb ischemia - poorly healing ulcer on left foot with occluded iliac, femoral artery on left Will schedule lower cineangiography with Dr. [name].  Currently planned for Monday morning 7:30 case.  He has adequate pain control medications from his primary physician and podiatrist.  Hypertension He is on propranolol and losartan. We'll need to assess his blood pressures upon arrival to the short stay area on Monday morning. He says his blood pressures have been relatively well-controlled at his PCP's and podiatrist's office.  Hyperlipidemia LDL goal <70 Not taking a statin. Apparently he been started on something several years ago and was not a tolerate. He has significant cramping. Will need lipid panel checked as part of his risk factor evaluation for modification. Ischemic done following his peripheral arterial procedure, given the urgent nature of the case.  Heavy cigarette smoker (20-39 per day) Unmotivated he quit. He denies any has any COPD symptoms either. Currently appears to be in a state of denial.  Other malaise and fatigue Probably related to him being sedentary right now. We'll check TSH.   This is a complex, difficult patient to deal with in the middle of the clinic day. He required a significant amount of coordination and communication with Dr. [name] as well as multiple staff members to arrange how to get him scheduled for his procedure. With the patient and with the coordination, well over an hour spent total.  Orders Placed This Encounter  Procedures  . DG Chest 2 View    Standing Status: Future     Number of Occurrences:      Standing Expiration Date: 12/30/2014    Order Specific Question:  Reason for Exam (SYMPTOM  OR DIAGNOSIS REQUIRED)    Answer:  Tobacco use, Pre-procedure    Order Specific Question:  Preferred imaging  location?    Answer:  [facility]  . CBC  . Basic metabolic panel  . Protime-INR  . APTT  . TSH  . LOWER EXTREMITY ANGIOGRAM   No orders of the defined types were placed in this encounter.    Followup: Post procedure with Dr. Hillery Aldo. Ellyn Hack, M.D., M.S. Interventional Cardiolgy [facility]

## 2013-10-30 NOTE — Assessment & Plan Note (Signed)
Probably related to him being sedentary right now. We'll check TSH.

## 2013-10-30 NOTE — Assessment & Plan Note (Signed)
Unmotivated he quit. He denies any has any COPD symptoms either. Currently appears to be in a state of denial.

## 2013-10-30 NOTE — Assessment & Plan Note (Signed)
Will schedule lower cineangiography with Dr. [name].  Currently planned for Monday morning 7:30 case.  He has adequate pain control medications from his primary physician and podiatrist.

## 2013-10-30 NOTE — Assessment & Plan Note (Signed)
He is on propranolol and losartan. We'll need to assess his blood pressures upon arrival to the short stay area on Monday morning. He says his blood pressures have been relatively well-controlled at his PCP's and podiatrist's office.

## 2013-10-30 NOTE — Assessment & Plan Note (Signed)
Not taking a statin. Apparently he been started on something several years ago and was not a tolerate. He has significant cramping. Will need lipid panel checked as part of his risk factor evaluation for modification. Ischemic done following his peripheral arterial procedure, given the urgent nature of the case.

## 2013-10-30 NOTE — Patient Instructions (Signed)
You'll be contacted with the date and time of the peripheral arterial catheterization procedure. He will have labs and a chest x-ray ordered to be done prior to your appointment on Monday morning.

## 2013-10-31 ENCOUNTER — Encounter: Payer: Self-pay | Admitting: Cardiovascular Disease

## 2013-11-01 LAB — CBC
HCT: 47.2 % (ref 39.0–52.0)
Hemoglobin: 17 g/dL (ref 13.0–17.0)
MCH: 34 pg (ref 26.0–34.0)
MCHC: 36 g/dL (ref 30.0–36.0)
MCV: 94.4 fL (ref 78.0–100.0)
PLATELETS: 276 10*3/uL (ref 150–400)
RBC: 5 MIL/uL (ref 4.22–5.81)
RDW: 13.4 % (ref 11.5–15.5)
WBC: 6.9 10*3/uL (ref 4.0–10.5)

## 2013-11-01 LAB — BASIC METABOLIC PANEL
BUN: 6 mg/dL (ref 6–23)
CALCIUM: 9.5 mg/dL (ref 8.4–10.5)
CO2: 28 meq/L (ref 19–32)
Chloride: 98 mEq/L (ref 96–112)
Creat: 0.84 mg/dL (ref 0.50–1.35)
Glucose, Bld: 118 mg/dL — ABNORMAL HIGH (ref 70–99)
Potassium: 4.9 mEq/L (ref 3.5–5.3)
SODIUM: 134 meq/L — AB (ref 135–145)

## 2013-11-01 LAB — TSH: TSH: 2.061 u[IU]/mL (ref 0.350–4.500)

## 2013-11-01 LAB — APTT: APTT: 32 s (ref 24–37)

## 2013-11-01 LAB — PROTIME-INR
INR: 0.91 (ref <1.50)
PROTHROMBIN TIME: 12.2 s (ref 11.6–15.2)

## 2013-11-03 ENCOUNTER — Encounter (HOSPITAL_COMMUNITY)
Admission: RE | Disposition: A | Discharge status: Home / Self Care | Payer: Self-pay | Source: Ambulatory Visit | Attending: Cardiovascular Disease

## 2013-11-03 ENCOUNTER — Other Ambulatory Visit: Payer: Self-pay | Admitting: *Deleted

## 2013-11-03 ENCOUNTER — Ambulatory Visit (HOSPITAL_COMMUNITY)
Admission: RE | Admit: 2013-11-03 | Discharge: 2013-11-04 | Disposition: A | Discharge status: Home / Self Care | Payer: BC Managed Care – PPO | Source: Ambulatory Visit | Attending: Cardiovascular Disease | Admitting: Cardiovascular Disease

## 2013-11-03 ENCOUNTER — Encounter (HOSPITAL_COMMUNITY): Payer: Self-pay | Admitting: General Practice

## 2013-11-03 DIAGNOSIS — I10 Essential (primary) hypertension: Secondary | ICD-10-CM | POA: Insufficient documentation

## 2013-11-03 DIAGNOSIS — I739 Peripheral vascular disease, unspecified: Secondary | ICD-10-CM

## 2013-11-03 DIAGNOSIS — I998 Other disorder of circulatory system: Secondary | ICD-10-CM

## 2013-11-03 DIAGNOSIS — F17210 Nicotine dependence, cigarettes, uncomplicated: Secondary | ICD-10-CM

## 2013-11-03 DIAGNOSIS — R5383 Other fatigue: Secondary | ICD-10-CM

## 2013-11-03 DIAGNOSIS — M1990 Unspecified osteoarthritis, unspecified site: Secondary | ICD-10-CM | POA: Insufficient documentation

## 2013-11-03 DIAGNOSIS — F1010 Alcohol abuse, uncomplicated: Secondary | ICD-10-CM | POA: Insufficient documentation

## 2013-11-03 DIAGNOSIS — F411 Generalized anxiety disorder: Secondary | ICD-10-CM | POA: Insufficient documentation

## 2013-11-03 DIAGNOSIS — I70219 Atherosclerosis of native arteries of extremities with intermittent claudication, unspecified extremity: Secondary | ICD-10-CM

## 2013-11-03 DIAGNOSIS — L97509 Non-pressure chronic ulcer of other part of unspecified foot with unspecified severity: Secondary | ICD-10-CM | POA: Insufficient documentation

## 2013-11-03 DIAGNOSIS — R5381 Other malaise: Secondary | ICD-10-CM | POA: Insufficient documentation

## 2013-11-03 DIAGNOSIS — L98499 Non-pressure chronic ulcer of skin of other sites with unspecified severity: Principal | ICD-10-CM | POA: Insufficient documentation

## 2013-11-03 DIAGNOSIS — F17200 Nicotine dependence, unspecified, uncomplicated: Secondary | ICD-10-CM | POA: Insufficient documentation

## 2013-11-03 DIAGNOSIS — E785 Hyperlipidemia, unspecified: Secondary | ICD-10-CM | POA: Insufficient documentation

## 2013-11-03 DIAGNOSIS — Z1211 Encounter for screening for malignant neoplasm of colon: Secondary | ICD-10-CM

## 2013-11-03 DIAGNOSIS — G8929 Other chronic pain: Secondary | ICD-10-CM | POA: Insufficient documentation

## 2013-11-03 DIAGNOSIS — I70229 Atherosclerosis of native arteries of extremities with rest pain, unspecified extremity: Secondary | ICD-10-CM | POA: Diagnosis present

## 2013-11-03 HISTORY — DX: Unspecified thoracic, thoracolumbar and lumbosacral intervertebral disc disorder: M51.9

## 2013-11-03 HISTORY — PX: LOWER EXTREMITY ANGIOGRAM: SHX5508

## 2013-11-03 HISTORY — DX: Low back pain, unspecified: M54.50

## 2013-11-03 HISTORY — DX: Low back pain: M54.5

## 2013-11-03 HISTORY — DX: Other chronic pain: G89.29

## 2013-11-03 HISTORY — PX: ILIAC ARTERY STENT: SHX1786

## 2013-11-03 HISTORY — DX: Other allergy status, other than to drugs and biological substances: Z91.09

## 2013-11-03 LAB — PROTIME-INR
INR: 0.84 (ref 0.00–1.49)
Prothrombin Time: 11.4 seconds — ABNORMAL LOW (ref 11.6–15.2)

## 2013-11-03 LAB — CBC
HCT: 45.6 % (ref 39.0–52.0)
Hemoglobin: 15.7 g/dL (ref 13.0–17.0)
MCH: 34 pg (ref 26.0–34.0)
MCHC: 34.4 g/dL (ref 30.0–36.0)
MCV: 98.7 fL (ref 78.0–100.0)
PLATELETS: 216 10*3/uL (ref 150–400)
RBC: 4.62 MIL/uL (ref 4.22–5.81)
RDW: 12.8 % (ref 11.5–15.5)
WBC: 5.7 10*3/uL (ref 4.0–10.5)

## 2013-11-03 LAB — POCT ACTIVATED CLOTTING TIME
ACTIVATED CLOTTING TIME: 237 s
Activated Clotting Time: 210 seconds
Activated Clotting Time: 121 seconds

## 2013-11-03 SURGERY — ANGIOGRAM, LOWER EXTREMITY
Anesthesia: LOCAL

## 2013-11-03 MED ORDER — OXYCODONE HCL 5 MG PO TABS
20.0000 mg | ORAL_TABLET | ORAL | Status: DC | PRN
  Administered 2013-11-03 – 2013-11-04 (×5): 20 mg via ORAL
  Filled 2013-11-03 (×6): qty 4

## 2013-11-03 MED ORDER — LORAZEPAM 2 MG/ML IJ SOLN
1.0000 mg | Freq: Four times a day (QID) | INTRAMUSCULAR | Status: DC | PRN
  Administered 2013-11-04 (×2): 1 mg via INTRAVENOUS

## 2013-11-03 MED ORDER — SODIUM CHLORIDE 0.9 % IJ SOLN
3.0000 mL | INTRAMUSCULAR | Status: DC | PRN
  Administered 2013-11-03: 3 mL via INTRAVENOUS

## 2013-11-03 MED ORDER — HEPARIN (PORCINE) IN NACL 2-0.9 UNIT/ML-% IJ SOLN
INTRAMUSCULAR | Status: AC
  Filled 2013-11-03: qty 500

## 2013-11-03 MED ORDER — VITAMIN B-1 100 MG PO TABS
100.0000 mg | ORAL_TABLET | Freq: Every day | ORAL | Status: DC
  Administered 2013-11-03: 100 mg via ORAL
  Filled 2013-11-03: qty 1

## 2013-11-03 MED ORDER — SODIUM CHLORIDE 0.9 % IV SOLN: INTRAVENOUS | Status: AC

## 2013-11-03 MED ORDER — LOSARTAN POTASSIUM 25 MG PO TABS
25.0000 mg | ORAL_TABLET | Freq: Every day | ORAL | Status: DC
  Administered 2013-11-04: 25 mg via ORAL
  Filled 2013-11-03: qty 1

## 2013-11-03 MED ORDER — HYDRALAZINE HCL 20 MG/ML IJ SOLN
10.0000 mg | INTRAMUSCULAR | Status: DC
  Administered 2013-11-03: 10 mg via INTRAVENOUS
  Filled 2013-11-03 (×2): qty 1

## 2013-11-03 MED ORDER — MIDAZOLAM HCL 2 MG/2ML IJ SOLN
INTRAMUSCULAR | Status: AC
  Filled 2013-11-03: qty 2

## 2013-11-03 MED ORDER — LORAZEPAM 2 MG/ML IJ SOLN
1.0000 mg | INTRAMUSCULAR | Status: DC | PRN
  Administered 2013-11-03: 1 mg via INTRAVENOUS
  Filled 2013-11-03: qty 1

## 2013-11-03 MED ORDER — FOLIC ACID 1 MG PO TABS
1.0000 mg | ORAL_TABLET | Freq: Every day | ORAL | Status: DC
  Administered 2013-11-04: 09:00:00 1 mg via ORAL
  Filled 2013-11-03: qty 1

## 2013-11-03 MED ORDER — ADULT MULTIVITAMIN W/MINERALS CH
1.0000 | ORAL_TABLET | Freq: Every day | ORAL | Status: DC
  Administered 2013-11-04: 1 via ORAL
  Filled 2013-11-03: qty 1

## 2013-11-03 MED ORDER — THIAMINE HCL 100 MG/ML IJ SOLN
100.0000 mg | Freq: Every day | INTRAMUSCULAR | Status: DC
  Filled 2013-11-03: qty 1

## 2013-11-03 MED ORDER — FENTANYL CITRATE 0.05 MG/ML IJ SOLN
INTRAMUSCULAR | Status: AC
  Filled 2013-11-03: qty 2

## 2013-11-03 MED ORDER — FOLIC ACID 1 MG PO TABS
1.0000 mg | ORAL_TABLET | Freq: Every day | ORAL | Status: DC
  Administered 2013-11-03: 23:00:00 1 mg via ORAL
  Filled 2013-11-03: qty 1

## 2013-11-03 MED ORDER — LORAZEPAM 1 MG PO TABS: 1.0000 mg | ORAL_TABLET | Freq: Four times a day (QID) | ORAL | Status: DC | PRN

## 2013-11-03 MED ORDER — SODIUM CHLORIDE 0.9 % IV SOLN
INTRAVENOUS | Status: DC
  Administered 2013-11-03: 06:00:00 via INTRAVENOUS

## 2013-11-03 MED ORDER — HEPARIN SODIUM (PORCINE) 1000 UNIT/ML IJ SOLN
INTRAMUSCULAR | Status: AC
  Filled 2013-11-03: qty 1

## 2013-11-03 MED ORDER — ASPIRIN 81 MG PO CHEW
81.0000 mg | CHEWABLE_TABLET | ORAL | Status: AC
  Administered 2013-11-03: 81 mg via ORAL
  Filled 2013-11-03: qty 1

## 2013-11-03 MED ORDER — VITAMIN B-1 100 MG PO TABS
100.0000 mg | ORAL_TABLET | Freq: Every day | ORAL | Status: DC
  Administered 2013-11-04: 100 mg via ORAL
  Filled 2013-11-03: qty 1

## 2013-11-03 MED ORDER — MORPHINE SULFATE 2 MG/ML IJ SOLN
2.0000 mg | INTRAMUSCULAR | Status: DC | PRN
  Administered 2013-11-03 – 2013-11-04 (×6): 2 mg via INTRAVENOUS
  Filled 2013-11-03 (×6): qty 1

## 2013-11-03 MED ORDER — ALBUTEROL SULFATE (2.5 MG/3ML) 0.083% IN NEBU
2.5000 mg | INHALATION_SOLUTION | Freq: Four times a day (QID) | RESPIRATORY_TRACT | Status: DC | PRN
  Filled 2013-11-03: qty 3

## 2013-11-03 MED ORDER — PROPRANOLOL HCL 20 MG PO TABS
20.0000 mg | ORAL_TABLET | Freq: Two times a day (BID) | ORAL | Status: DC
  Administered 2013-11-03 – 2013-11-04 (×2): 20 mg via ORAL
  Filled 2013-11-03 (×3): qty 1

## 2013-11-03 MED ORDER — LIDOCAINE HCL (PF) 1 % IJ SOLN
INTRAMUSCULAR | Status: AC
  Filled 2013-11-03: qty 30

## 2013-11-03 MED ORDER — LORAZEPAM 2 MG/ML IJ SOLN: 0.0000 mg | Freq: Two times a day (BID) | INTRAMUSCULAR | Status: DC

## 2013-11-03 MED ORDER — LORAZEPAM 2 MG/ML IJ SOLN
0.0000 mg | Freq: Four times a day (QID) | INTRAMUSCULAR | Status: DC
  Administered 2013-11-04: 1 mg via INTRAVENOUS
  Filled 2013-11-03 (×2): qty 1

## 2013-11-03 MED ORDER — ALPRAZOLAM 0.5 MG PO TABS: 1.0000 mg | ORAL_TABLET | Freq: Every evening | ORAL | Status: DC | PRN

## 2013-11-03 MED ORDER — ALBUTEROL SULFATE HFA 108 (90 BASE) MCG/ACT IN AERS: 2.0000 | INHALATION_SPRAY | Freq: Every day | RESPIRATORY_TRACT | Status: DC | PRN

## 2013-11-03 MED ORDER — PROMETHAZINE HCL 25 MG/ML IJ SOLN: 25.0000 mg | Freq: Once | INTRAMUSCULAR | Status: DC

## 2013-11-03 MED ORDER — GABAPENTIN 300 MG PO CAPS
300.0000 mg | ORAL_CAPSULE | Freq: Three times a day (TID) | ORAL | Status: DC
  Administered 2013-11-03 – 2013-11-04 (×3): 300 mg via ORAL
  Filled 2013-11-03 (×6): qty 1

## 2013-11-03 MED ORDER — ASPIRIN EC 325 MG PO TBEC
325.0000 mg | DELAYED_RELEASE_TABLET | Freq: Every day | ORAL | Status: DC
  Administered 2013-11-04: 325 mg via ORAL
  Filled 2013-11-03: qty 1

## 2013-11-03 MED ORDER — ADULT MULTIVITAMIN W/MINERALS CH
1.0000 | ORAL_TABLET | Freq: Every day | ORAL | Status: DC
  Administered 2013-11-03: 23:00:00 1 via ORAL
  Filled 2013-11-03: qty 1

## 2013-11-03 NOTE — Interval H&P Note (Signed)
History and Physical Interval Note:  11/03/2013 7:38 AM  Jimmy Tucker  has presented today for surgery, with the diagnosis of PAD  The various methods of treatment have been discussed with the patient and family. After consideration of risks, benefits and other options for treatment, the patient has consented to  Procedure(s): LOWER EXTREMITY ANGIOGRAM (N/A) as a surgical intervention .  The patient's history has been reviewed, patient examined, no change in status, stable for surgery.  I have reviewed the patient's chart and labs.  Questions were answered to the patient's satisfaction.     Lorretta Harp

## 2013-11-03 NOTE — H&P (View-Only) (Signed)
 PATIENT: Jimmy Tucker MRN: [id number] DOB: 03/02/1961 PCP: MCGOUGH,WILLIAM M, MD  Clinic Note: Chief Complaint  Patient presents with  . Foot Pain    Significant left leg claudication with foot and toe pain. There is a ulcer on the left great toe    HPI: Jimmy Tucker is a 53 y.o. male with a PMH below who presents today for lower extremity arterial Dopplers to evaluate likely progression of peripheral arterial disease in the left lower extremity.  He had seen Dr. Berry in the old [facility] prior to moving to a new office. 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 He is a long-term smoker who is reluctant to quit. He was intolerant of statins and is basically just stopped them. He still takes blood pressure medications but has not been seen by Dr. Berry since at least 2011. He is now established with Dr. McGough.  Interval History: He is noted for about the last month or 2 going from one doctor to another doctor to try to treat due to his significant pain in the left foot-leg. It is quite an hour he has a hard time walking. He is a lesion on the left great toe, states occasionally he gets swelling and redness in the foot. Does not feel warm to touch as it is infected. Residuals. He has been treated with steroids and antibiotics and different creams. He finally went to Dr. Sikora from podiatry who referred him for lower extremity Dopplers. I was actually asked to see the gentleman after our technologist identified significant occlusion of the left common iliac, external iliac into the common femoral and superficial femoral artery. With significant resting foot pain in this leg, I am concerned of possible critical limb ischemia. He has claudication just to be walking around the room, but the foot it is more painful because of a lesion on his toe.  As I mentioned, no active infectious symptoms.  From a cardiac  standpoint, he has not had any symptoms of chest tightness/pressure with rest or exertion. He does get exertional dyspnea and has some occasional wheezing and coughing. His wife indicates that he coughs and wheezes more than she doesn't she's been diagnosed with COPD. He denies any PND, orthopnea. He has had swelling of the left leg/foot but none right. He denies any rapid or irregular heartbeats. No syncope/near-syncope, no TIA or RCA symptoms. No melena, hematochezia or hematuria. *Location symptoms, he does note intermittent hip claudication. Exactly when this started he is not sure but he thinks he must initially thought it was related to his chronic back pain issues this started with hip pain. Fortunately by the time he realized that it was different the pain was going down into his leg and now is going from his foot up.   Past Medical History  Diagnosis Date  . Chronic back pain   . Hypertension   . Anxiety   . Heavy cigarette smoker (20-39 per day)     Unmotivated  . PAD (peripheral artery disease) 10/30/2012    Previous LEA Dopplers in ~2011 demonstrated Left ATA occlusion; Dopplers revealed occlusion of the left common iliac with a short reconstitution at the branch point of the external and internal iliac, the external iliac is occluded through the common femoral and proximal SFA; reconstitutes in the proximal SFA. Two-vessel runoff beyond.  . Hyperlipidemia LDL goal <70     Intolerant to statins      Prior Cardiac Evaluation and Past Surgical History: Past Surgical History  Procedure Laterality Date  . Back surgery    . Left testicle cyst removal    . Colonoscopy  07/24/2011    Procedure: COLONOSCOPY;  Surgeon: Daneil Dolin, MD;  [facility];  Service: Endoscopy;  Laterality: N/A;  8:15    No Known Allergies  Current Outpatient Prescriptions  Medication Sig Dispense Refill  . albuterol (PROAIR HFA) 108 (90 BASE) MCG/ACT inhaler Inhale 2 puffs into the lungs daily as  needed for wheezing or shortness of breath.      . ALPRAZolam (XANAX) 1 MG tablet Take 1 mg by mouth at bedtime as needed for anxiety.       . gabapentin (NEURONTIN) 300 MG capsule Take 300 mg by mouth 3 (three) times daily.      Marland Kitchen losartan (COZAAR) 50 MG tablet Take 25 mg by mouth daily.       . Oxycodone HCl 20 MG TABS Take 20 mg by mouth every 4 (four) hours as needed (pain).       . propranolol (INDERAL) 20 MG tablet Take 20 mg by mouth 2 (two) times daily.        Current Facility-Administered Medications  Medication Dose Route Frequency Provider Last Rate Last Dose  . promethazine (PHENERGAN) injection 25 mg  25 mg Intravenous Once Daneil Dolin, MD        History   Social History Narrative   Married, still smokes about a pack a day. Unable to work currently because of his claudication.   He does work for [employer] in [place], [place].   family history includes Heart attack (age of onset: 52) in his father; Stroke (age of onset: 30) in his brother; Sudden death in his father. There is no history of Colon cancer.  ROS: A comprehensive Review of Systems - Negative except Significant foot pain, claudication. He has somewhat generalized malaise. Frequent chronic cough but not overly productive. 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  PHYSICAL EXAM There were no vitals taken for this visit. -- he was seen in the peripheral vascular Doppler room. No vitals were checked General appearance: alert, cooperative, appears stated age and Somewhat disheveled. He has distinct odor of cigarettes. Unshaven. He is wearing moccasin slippers so that disease he did get his foot into. HEENT: Real/AT, EOMI, MMM, anicteric sclera; poor dentition Neck: no adenopathy, no carotid bruit, no JVD, supple, symmetrical, trachea midline and thyroid not enlarged, symmetric, no tenderness/mass/nodules Lungs: Diffuse historian x-ray wheezes with increased AP diameter and prolonged expiratory  phase. No rales but some rhonchorous inhalations. No crackles. Non-labored despite increased AP diameter. Heart: RRR with normal S1 and S2, soft 1/6 SEM and RUSB but otherwise no R./E. Abdomen: soft, non-tender; bowel sounds normal; no masses,  no organomegaly Extremities: The right lower extremity has mild trophic PAD changes but with warm perfusion. No lesions or wounds. The left lower Sherrod is notable for erythematous changes from the foot up into the ankle. The most therapy and is involving the left great toe. On the lateral aspect there is a half centimeter diameter black eschar indicating a small ulcerative lesion. The foot itself is not cold or tenderness. He can flex the foot some but doing so very painful. Pulses: Diminished 1+ DP and PT pulse on the right, but essentially on palpable DP and PT pulses on the left. Also popliteal pulse on the left are somewhat diminished. Bilateral femoral bruits are  noted. Good femoral pulse the left leg is very difficult to palpate. Skin: Erythematous tight skin changes on the left foot, with bilateral PAD changes with minimal hair Neurologic: Mental status: Alert, oriented, thought content appropriate Cranial nerves: normal  Recent Labs: None  ASSESSMENT / PLAN: 52-year-old gentleman with known mild PAD now presenting with essentially critical limb ischemia and Doppler evidence of occluded iliac and common femoral artery and left. The left foot has a clear punctate ulcer, but there is no sign of gangrene or active infection.  He does not have any cardiac symptoms his peak. However he continues to smoke and has clear exam findings to suggest COPD. Basilar fact that he has significant resting pain and intractable claudication symptoms at that he needed to be evaluated with a lower STEMI angiography procedure. We'll set this up for Monday morning first case. He will have preprocedure labs drawn I would suspect that based on the ultrasound findings he may very  well require fem-fem bypass, will defer to Dr. Berry with the angiographic images to determine the best course of action would be.  Critical lower limb ischemia - poorly healing ulcer on left foot with occluded iliac, femoral artery on left Will schedule lower cineangiography with Dr. Berry.  Currently planned for Monday morning 7:30 case.  He has adequate pain control medications from his primary physician and podiatrist.  Hypertension He is on propranolol and losartan. We'll need to assess his blood pressures upon arrival to the short stay area on Monday morning. He says his blood pressures have been relatively well-controlled at his PCP's and podiatrist's office.  Hyperlipidemia LDL goal <70 Not taking a statin. Apparently he been started on something several years ago and was not a tolerate. He has significant cramping. Will need lipid panel checked as part of his risk factor evaluation for modification. Ischemic done following his peripheral arterial procedure, given the urgent nature of the case.  Heavy cigarette smoker (20-39 per day) Unmotivated he quit. He denies any has any COPD symptoms either. Currently appears to be in a state of denial.  Other malaise and fatigue Probably related to him being sedentary right now. We'll check TSH.   This is a complex, difficult patient to deal with in the middle of the clinic day. He required a significant amount of coordination and communication with Dr. Berry as well as multiple staff members to arrange how to get him scheduled for his procedure. With the patient and with the coordination, well over an hour spent total.  Orders Placed This Encounter  Procedures  . DG Chest 2 View    Standing Status: Future     Number of Occurrences:      Standing Expiration Date: 12/30/2014    Order Specific Question:  Reason for Exam (SYMPTOM  OR DIAGNOSIS REQUIRED)    Answer:  Tobacco use, Pre-procedure    Order Specific Question:  Preferred imaging  location?    Answer:  [facility]  . CBC  . Basic metabolic panel  . Protime-INR  . APTT  . TSH  . LOWER EXTREMITY ANGIOGRAM   No orders of the defined types were placed in this encounter.    Followup: Post procedure with Dr. Berry   DAVID W. HARDING, M.D., M.S. Interventional Cardiolgy [facility]    

## 2013-11-03 NOTE — Consult Note (Addendum)
[facility] [place]  Patient name: Jimmy Tucker MRN: [id number] DOB: 1961/04/06 Sex: male  REASON FOR CONSULT: Critical limb ischemia with a left common femoral artery occlusion. Consult from Dr. [name].  HPI: Jimmy Tucker is a 53 y.o. male who states that he developed wounds on his left foot approximately 4-5 months ago. He has a wound on the medial aspect of the left great toe and the lateral aspect of the fifth metatarsal head. In addition he describes left hip thigh and calf claudication at a very short distance. His pain is brought on by ambulation and relieved with rest. He also describes some symptoms in the left leg consistent with rest pain.  His risk factors for peripheral vascular disease include hypertension and tobacco use. In addition he has a history of premature cardiovascular disease. He denies any history of diabetes or hypercholesterolemia.  He denies fever or chills.  Past Medical History  Diagnosis Date  . Chronic back pain   . Hypertension   . Anxiety   . Heavy cigarette smoker (20-39 per day)     Unmotivated  . PAD (peripheral artery disease) 10/30/2012    Previous LEA Dopplers in ~2011 demonstrated Left ATA occlusion; Dopplers revealed occlusion of the left common iliac with a short reconstitution at the branch point of the external and internal iliac, the external iliac is occluded through the common femoral and proximal SFA; reconstitutes in the proximal SFA. Two-vessel runoff beyond.  . Hyperlipidemia LDL goal <70     Intolerant to statins   Family History  Problem Relation Age of Onset  . Colon cancer Neg Hx   . Heart attack Father 72    Cardiac arrest  . Sudden death Father   . Stroke Brother 65   SOCIAL HISTORY: History  Substance Use Topics  . Smoking status: Current Every Day Smoker -- 1.00 packs/day for 35 years    Types: Cigarettes  . Smokeless tobacco: Not on file  . Alcohol Use: 12.6 oz/week    21 Cans of  beer per week     Comment: every day    No Known Allergies  Current Facility-Administered Medications  Medication Dose Route Frequency Provider Last Rate Last Dose  . hydrALAZINE (APRESOLINE) injection 10 mg  10 mg Intravenous UD Lorretta Harp, MD       REVIEW OF SYSTEMS: Valu.Nieves ] denotes positive finding; [  ] denotes negative finding CARDIOVASCULAR:  [ ]  chest pain   [ ]  chest pressure   [ ]  palpitations   [ ]  orthopnea   [ ]  dyspnea on exertion   Valu.Nieves ] claudication   Valu.Nieves ] rest pain   [ ]  DVT   [ ]  phlebitis PULMONARY:   [ ]  productive cough   [ ]  asthma   Valu.Nieves ] wheezing NEUROLOGIC:   [ ]  weakness  [ ]  paresthesias  [ ]  aphasia  [ ]  amaurosis  [ ]  dizziness HEMATOLOGIC:   [ ]  bleeding problems   [ ]  clotting disorders MUSCULOSKELETAL:  [ ]  joint pain   [ ]  joint swelling [ ]  leg swelling GASTROINTESTINAL: [ ]   blood in stool  [ ]   hematemesis GENITOURINARY:  [ ]   dysuria  [ ]   hematuria PSYCHIATRIC:  [ ]  history of major depression INTEGUMENTARY:  [ ]  rashes  Valu.Nieves ] ulcers CONSTITUTIONAL:  [ ]  fever   [ ]  chills  PHYSICAL EXAM: Filed Vitals:   11/03/13 0531  BP:  161/89  Pulse: 97  Temp: 98.3 F (36.8 C)  TempSrc: Oral  Resp: 20  Height: 5\' 6"  (1.676 m)  Weight: 161 lb (73.029 kg)  SpO2: 98%   Body mass index is 26 kg/(m^2). GENERAL: The patient is a well-nourished male, in no acute distress. The vital signs are documented above. CARDIOVASCULAR: There is a regular rate and rhythm. I do not detect carotid bruits. I cannot assess his right femoral pulse as he has a sheath in place. There is no left femoral pulse. On the right side he has a palpable popliteal and dorsalis pedis pulse. I cannot palpate a left popliteal, dorsalis pedis, or posterior tibial pulse. PULMONARY: There is good air exchange bilaterally. He does have wheezing bilaterally. ABDOMEN: Soft and non-tender with normal pitched bowel sounds. I do not appreciate an abdominal aortic aneurysm. MUSCULOSKELETAL: He has  some cyanosis of the toes of the left foot. He has a small wound on the medial aspect of his left great toe and the lateral aspect of his fifth metatarsal head. NEUROLOGIC: No focal weakness or paresthesias are detected. PSYCHIATRIC: The patient has a normal affect.  DATA:  Lab Results  Component Value Date   WBC 5.7 11/03/2013   HGB 15.7 11/03/2013   HCT 45.6 11/03/2013   MCV 98.7 11/03/2013   PLT 216 11/03/2013   Lab Results  Component Value Date   NA 134* 10/30/2013   K 4.9 10/30/2013   CL 98 10/30/2013   CO2 28 10/30/2013   Lab Results  Component Value Date   CREATININE 0.84 10/30/2013   Lab Results  Component Value Date   INR 0.84 11/03/2013   INR 0.91 10/30/2013   I have reviewed his arteriogram which was performed today. On the right side, he does have some calcific plaque in the distal right common iliac artery. He also is disease of the distal popliteal on the right and peroneal artery. Dominant runoff on the right is the anterior tibial and posterior tibial arteries.  On the left side, which is the symptomatic side, he underwent angioplasty and stenting of a left common and external iliac artery stenosis with an excellent result. He has a occlusion of the common femoral artery down to the bifurcation. The superficial femoral artery and deep femoral artery are patent. The popliteal artery is patent. The tibial peroneal trunk and posterior tibial arteries are patent. The anterior tibial artery on the left is occluded. The peroneal artery is patent proximally but occluded distally.  I have reviewed his arterial study which was done at Dr. Kennon Holter office. ABI on the right is 91%. Doppler flow could not be obtained in the left foot.  MEDICAL ISSUES:  PERIPHERAL VASCULAR DISEASE WITH CRITICAL LIMB ISCHEMIA: This patient has an occlusion of the left common femoral artery. I would agree that left common femoral artery endarterectomy and vein patch is indicated. He has undergone successful  angioplasty and stenting of the proximal disease in the left iliac system. He has reasonable runoff distally and I think that a dressing the common femoral artery stenosis should significantly improve his chances of healing the wound on his left foot. We have discussed the importance of tobacco cessation and he states that he quit yesterday. I also encouraged him to take an aspirin a day and we also discussed the use of a statin. However, in reviewing his records apparently in the past she's had some intolerance to statins. His left common femoral endarterectomy scheduled for 11/11/2013. I have discussed indications  for the procedure and the potential complications including but not limited to bleeding, wound healing problems, and infection. All his questions answered and he is agreeable to proceed. He does not want a Foley catheter during the procedure. In addition there may be some concern of alcohol use and he may need DT prophylaxis.  In addition, Dr. [name] would like him started on Plavix after the procedure given the recent iliac stent.  Angelia Mould [facility] [place] Beeper: [phone]

## 2013-11-03 NOTE — CV Procedure (Signed)
Jimmy Tucker is a 53 y.o. male    [id number] LOCATION:  FACILITY: [facility]  PHYSICIAN: [name], M.D. 10/15/1960   DATE OF PROCEDURE:  11/03/2013  DATE OF DISCHARGE:     PV Angiogram/Intervention    History obtained from chart review.[name] is a 53 year old  Married Caucasian male who I saw several years ago. He patient of Dr.William McGough in [place]. He has a history of tobacco abuse, hypertension and hyperlipidemia. Because of critical limb ischemia he had arterial Dopplers performed in our office on 5 10/30/2013 revealing an occluded common iliac, occluded SFA and proximal SFA. He was referred by Dr. Ellyn Hack for angiography and potential intervention.   PROCEDURE DESCRIPTION:   The patient was brought to the second floor [facility] cath lab in the postabsorptive state. He was premedicated with Valium 5 mg by mouth, IV Versed and fentanyl.Marland Kitchen His right groin was prepped and shaved in usual sterile fashion. Xylocaine 1% was used for local anesthesia. A 5 French sheath was inserted into the right common femoral artery using standard Seldinger technique.a 5 French pigtail catheter was used for distal abdominal aortography, bilateral iliac angiography with bifemoral runoff using bolus chase digital subtraction step table technique. Visipaque dye was used for the entirety of the case. Retrograde aortic pressure was monitored during the case.   HEMODYNAMICS:    AO SYSTOLIC/AO DIASTOLIC: 242/68   Angiographic Data:   1: Abdominal aortogram-the distal abdominal aorta was free of significant disease.  2: Left lower extremity-there was a 99% calcified distal left common iliac artery stenosis. The entire iliac system appeared calcified. The common femoral artery was occluded with reconstitution at the profunda SFA bifurcation. The entire SFA had 50-60% diffuse stenosis with three-vessel runoff and an occluded anterior tibial artery.  3: Right lower extremity-50-60% distal  right common iliac artery stenosis. 75% focal infrapopliteal stenosis just before the take off of the anterior tibial artery with three-vessel runoff.  IMPRESSION:[name] has critical limb ischemia with a high grade calcified left common iliac artery stenosis and an occluded common femoral artery. We will proceed with PTA and stenting of his common iliac with staged left common femoral endarterectomy and patch angioplasty.  Procedure Description:contralateral access was obtained with a 5 [place] crossover catheter, 035 Glidewire followed by a 035 Rosen wire. A 6 French crossover sheath was then used to obtain contralateral access. A total of 7000 units of heparin was a minister intravenously with an ACT of 210. A total of 170 cc of contrast was administered to the patient. The lesion was predilated with a 4 mm by 2 cm balloon. Following the stenting was performed with a 10 mm x 4 cm [employer] Nitinol self-expanding stent. This was sequentially post dilated with a 6 mm x 2 cm upgrading to a 7 mm x 2 cm noncompliant balloon resulting in reduction of a 99% calcified distal left common iliac artery stenosis to less than 20% residual.  Final Impression: successful PTA and stenting of a high-grade calcified distal left common iliac artery stenosis for critical limb ischemia. The patient has residual occlusion of his distal left common femoral artery. I have reviewed this with Dr. Gae Gallop who has agreed to perform endarterectomy with patch angioplasty. The 6 French sheath was upgraded to a 7 [place] sheath because of oozing. The sheath will be removed and pressure will be held on the groin to achieve hemostasis once the ACT falls below 170. The patient will be hydrated overnight and discharged  home in the morning. He left the Cath Lab in stable condition.    Lorretta Harp. MD, [facility] 11/03/2013 9:17 AM

## 2013-11-03 NOTE — Progress Notes (Signed)
Site area: right groin  Site Prior to Removal:  Level 0  Pressure Applied For 20 MINUTES    Minutes Beginning at 1320  Manual:   yes  Patient Status During Pull:  stable  Post Pull Groin Site:  Level 0  Post Pull Instructions Given:  yes  Post Pull Pulses Present:  yes  Dressing Applied:  yes  Comments:

## 2013-11-04 ENCOUNTER — Other Ambulatory Visit: Payer: Self-pay | Admitting: Physician Assistant

## 2013-11-04 DIAGNOSIS — I999 Unspecified disorder of circulatory system: Secondary | ICD-10-CM

## 2013-11-04 DIAGNOSIS — F17200 Nicotine dependence, unspecified, uncomplicated: Secondary | ICD-10-CM

## 2013-11-04 DIAGNOSIS — I10 Essential (primary) hypertension: Secondary | ICD-10-CM

## 2013-11-04 DIAGNOSIS — I739 Peripheral vascular disease, unspecified: Secondary | ICD-10-CM

## 2013-11-04 DIAGNOSIS — E785 Hyperlipidemia, unspecified: Secondary | ICD-10-CM

## 2013-11-04 LAB — BASIC METABOLIC PANEL
BUN: 6 mg/dL (ref 6–23)
CALCIUM: 9.3 mg/dL (ref 8.4–10.5)
CO2: 26 mEq/L (ref 19–32)
Chloride: 96 mEq/L (ref 96–112)
Creatinine, Ser: 0.71 mg/dL (ref 0.50–1.35)
GFR calc non Af Amer: >90 mL/min (ref >90)
Glucose, Bld: 107 mg/dL — ABNORMAL HIGH (ref 70–99)
POTASSIUM: 3.9 meq/L (ref 3.7–5.3)
Sodium: 135 mEq/L — ABNORMAL LOW (ref 137–147)

## 2013-11-04 LAB — CBC
HCT: 43.1 % (ref 39.0–52.0)
Hemoglobin: 15 g/dL (ref 13.0–17.0)
MCH: 33.6 pg (ref 26.0–34.0)
MCHC: 34.8 g/dL (ref 30.0–36.0)
MCV: 96.4 fL (ref 78.0–100.0)
Platelets: 204 10*3/uL (ref 150–400)
RBC: 4.47 MIL/uL (ref 4.22–5.81)
RDW: 12.6 % (ref 11.5–15.5)
WBC: 6.3 10*3/uL (ref 4.0–10.5)

## 2013-11-04 MED ORDER — ASPIRIN 325 MG PO TBEC: 325.0000 mg | DELAYED_RELEASE_TABLET | Freq: Every day | ORAL | Status: DC

## 2013-11-04 MED ORDER — THIAMINE HCL 100 MG PO TABS: 100.0000 mg | ORAL_TABLET | Freq: Every day | ORAL | Status: DC

## 2013-11-04 MED ORDER — OXYCODONE HCL 20 MG PO TABS: 20.0000 mg | ORAL_TABLET | ORAL | Status: DC | PRN

## 2013-11-04 MED ORDER — FOLIC ACID 1 MG PO TABS: 1.0000 mg | ORAL_TABLET | Freq: Every day | ORAL | Status: DC

## 2013-11-04 MED ORDER — LOSARTAN POTASSIUM 50 MG PO TABS: 25.0000 mg | ORAL_TABLET | Freq: Every day | ORAL | Status: DC

## 2013-11-04 NOTE — Progress Notes (Signed)
Patient did not rest well last night. He was very anxious and pain to the left foot. I had to notify MD on call to start CIWA protocol. Patient kept tremors most of the night with others symptoms as well. I tried to alternate between pain meds and ativan he tolerated well.  I suspect he has a high tolerance to the meds however I gave the meds and watched him closely.

## 2013-11-04 NOTE — Discharge Summary (Signed)
Physician Discharge Summary     Patient ID: Jimmy Tucker MRN: [id number] DOB/AGE: [birth year] 53 y.o.  Admit date: 11/03/2013 Discharge date: 11/04/2013  Admission Diagnoses:   Critical lower limb ischemia - poorly healing ulcer on left foot with occluded iliac, femoral artery on left   Discharge Diagnoses:  Active Problems:   Critical lower limb ischemia - poorly healing ulcer on left foot with occluded iliac, femoral artery on left   ETOH abuse   Tobacco abuse   HTN    Discharged Condition: stable  Hospital Course:  [name] is a 53 year old Married Caucasian male who I saw several years ago. He patient of Dr.William McGough in [place] He has a history of tobacco abuse, hypertension and hyperlipidemia. Because of critical limb ischemia he had arterial Dopplers performed in our office on 5 10/30/2013 revealing an occluded common iliac, occluded SFA and proximal SFA.   The patient underwent successful PTA and stenting of a high-grade calcified distal left common iliac artery stenosis for critical limb ischemia.  Dr. Scot Dock was consulted and scheduled left common femoral artery endarterectomy and vein patch on May 16.  Cozaar was increased to 50mg .  Plavix will be started after surgery.  The patient was seen by Dr. Ellyn Hack who felt he was stable for DC home.   Follow up LEA dopplers ordered.     Consults: Vascular Surgery  Significant Diagnostic Studies:  PROCEDURE DESCRIPTION:  The patient was brought to the second floor [facility]iac cath lab in the postabsorptive state. He was premedicated with Valium 5 mg by mouth, IV Versed and fentanyl.Marland Kitchen His right groin  was prepped and shaved in usual sterile fashion. Xylocaine 1% was used for local anesthesia. A 5 French sheath was inserted into the right common femoral artery using standard Seldinger technique.a 5 French pigtail catheter was used for distal abdominal aortography, bilateral iliac angiography with bifemoral runoff  using bolus chase digital subtraction step table technique. Visipaque dye was used for the entirety of the case. Retrograde aortic pressure was monitored during the case.  HEMODYNAMICS:  AO SYSTOLIC/AO DIASTOLIC: 283/15  Angiographic Data:  1: Abdominal aortogram-the distal abdominal aorta was free of significant disease.  2: Left lower extremity-there was a 99% calcified distal left common iliac artery stenosis. The entire iliac system appeared calcified. The common femoral artery was occluded with reconstitution at the profunda SFA bifurcation. The entire SFA had 50-60% diffuse stenosis with three-vessel runoff and an occluded anterior tibial artery.  3: Right lower extremity-50-60% distal right common iliac artery stenosis. 75% focal infrapopliteal stenosis just before the take off of the anterior tibial artery with three-vessel runoff.   IMPRESSION:[name] has critical limb ischemia with a high grade calcified left common iliac artery stenosis and an occluded common femoral artery. We will proceed with PTA and stenting of his common iliac with staged left common femoral endarterectomy and patch angioplasty.    Procedure Description:contralateral access was obtained with a 5 [place] crossover catheter, 035 Glidewire followed by a 035 Rosen wire. A 6 French crossover sheath was then used to obtain contralateral access. A total of 7000 units of heparin was a minister intravenously with an ACT of 210. A total of 170 cc of contrast was administered to the patient. The lesion was predilated with a 4 mm by 2 cm balloon. Following the stenting was performed with a 10 mm x 4 cm [employer] Nitinol self-expanding stent. This was sequentially post dilated with a 6 mm x 2  cm upgrading to a 7 mm x 2 cm noncompliant balloon resulting in reduction of a 99% calcified distal left common iliac artery stenosis to less than 20% residual.   Final Impression: successful PTA and stenting of a high-grade  calcified distal left common iliac artery stenosis for critical limb ischemia. The patient has residual occlusion of his distal left common femoral artery. I have reviewed this with Dr. Gae Gallop who has agreed to perform endarterectomy with patch angioplasty. The 6 French sheath was upgraded to a 7 [place] sheath because of oozing. The sheath will be removed and pressure will be held on the groin to achieve hemostasis once the ACT falls below 170. The patient will be hydrated overnight and discharged home in the morning. He left the Cath Lab in stable condition.  Lorretta Harp. MD, [facility]  11/03/2013  Treatments: See Above  Discharge Exam: Blood pressure 168/76, pulse 91, temperature 98.4 F (36.9 C), temperature source Oral, resp. rate 18, height 5\' 6"  (1.676 m), weight 162 lb 7.7 oz (73.7 kg), SpO2 94.00%.   Disposition: 01-Home or Self Care      Discharge Orders   Future Appointments Provider Department Dept Phone   12/17/2013 9:30 AM Leonie Man, MD [facility] [phone]   Future Orders Complete By Expires   Diet - low sodium heart healthy  As directed    Discharge instructions  As directed    Increase activity slowly  As directed        Medication List         ALPRAZolam 1 MG tablet  Commonly known as:  XANAX  Take 1 mg by mouth at bedtime as needed for anxiety.     aspirin 325 MG EC tablet  Take 1 tablet (325 mg total) by mouth daily.     folic acid 1 MG tablet  Commonly known as:  FOLVITE  Take 1 tablet (1 mg total) by mouth daily.     gabapentin 300 MG capsule  Commonly known as:  NEURONTIN  Take 300 mg by mouth 3 (three) times daily.     losartan 50 MG tablet  Commonly known as:  COZAAR  Take 0.5 tablets (25 mg total) by mouth daily.     Oxycodone HCl 20 MG Tabs  Take 20 mg by mouth every 4 (four) hours as needed (pain).     PROAIR HFA 108 (90 BASE) MCG/ACT inhaler  Generic drug:  albuterol  Inhale 2 puffs into the lungs daily as  needed for wheezing or shortness of breath.     propranolol 20 MG tablet  Commonly known as:  INDERAL  Take 20 mg by mouth 2 (two) times daily.     thiamine 100 MG tablet  Take 1 tablet (100 mg total) by mouth daily.       Follow-up Information   Follow up with Leonie Man, MD On 12/17/2013. (9:30AM)    Specialty:  Cardiology   Contact information:   [address] [place] [address] [phone]       Signed: Tarri Fuller, [facility] 11/04/2013, 9:13 AM  I have seen & examined the patient this AM along with Mr. Samara Snide.  Overnight Stay post PTA-Stent.  CLI of LLE- foot with ulcerated L Great toe - s/p PTA-Stent on LCIA as step 1 of revascularization --> plan is RCFA CEA /Patch angioplasty 5/19 by Dr. Scot Dock.  Plan is ASA 325 mg, 50 mg Losartan, BB.  Will provide additional Rx of his  home Oxycodone for pain to bridge until Post-op.  Start Plavix POD#1  Leonie Man, M.D., M.S. Interventional Cardiologist   Pager # [phone] 11/04/2013

## 2013-11-04 NOTE — Progress Notes (Signed)
I have seen & examined the patient this AM along with Mr. Samara Snide.  CLI of LLE- foot with ulcerated L Great toe - s/p PTA-Stent on LCIA as step 1 of revascularization --> plan is RCFA CEA /Patch angioplasty 5/19 by Dr. Scot Dock.   Plan is ASA 325 mg, 50 mg Losartan, BB.  Will provide additional Rx of his home Oxycodone for pain to bridge until Post-op.  Start Plavix POD#1.  Leonie Man, M.D., M.S. Interventional Cardiologist   Pager # [phone] 11/04/2013

## 2013-11-04 NOTE — Progress Notes (Signed)
Subjective: Did not sleep well.  Left leg was hurting.  Objective: Vital signs in last 24 hours: Temp:  [97.7 F (36.5 C)-98.7 F (37.1 C)] 98.7 F (37.1 C) (05/12 0336) Pulse Rate:  [79-100] 84 (05/12 0500) Resp:  [16-20] 18 (05/12 0336) BP: (116-212)/(55-118) 164/79 mmHg (05/12 0700) SpO2:  [93 %-100 %] 93 % (05/12 0500) Weight:  [162 lb 7.7 oz (73.7 kg)] 162 lb 7.7 oz (73.7 kg) (05/12 0036) Last BM Date: 11/02/13  Intake/Output from previous day: 05/11 0701 - 05/12 0700 In: 1027 [P.O.:600; I.V.:915] Out: 2050 [Urine:2050] Intake/Output this shift:    Medications Current Facility-Administered Medications  Medication Dose Route Frequency Provider Last Rate Last Dose  . albuterol (PROVENTIL) (2.5 MG/3ML) 0.083% nebulizer solution 2.5 mg  2.5 mg Nebulization Q6H PRN Lorretta Harp, MD      . aspirin EC tablet 325 mg  325 mg Oral Daily Lorretta Harp, MD      . folic acid (FOLVITE) tablet 1 mg  1 mg Oral Daily Larey Dresser, MD      . gabapentin (NEURONTIN) capsule 300 mg  300 mg Oral TID Lorretta Harp, MD   300 mg at 11/03/13 2246  . hydrALAZINE (APRESOLINE) injection 10 mg  10 mg Intravenous UD Lorretta Harp, MD   10 mg at 11/03/13 1848  . LORazepam (ATIVAN) injection 0-4 mg  0-4 mg Intravenous Q6H Larey Dresser, MD   1 mg at 11/04/13 0004   Followed by  . [START ON 11/06/2013] LORazepam (ATIVAN) injection 0-4 mg  0-4 mg Intravenous Q12H Larey Dresser, MD      . LORazepam (ATIVAN) tablet 1 mg  1 mg Oral Q6H PRN Larey Dresser, MD       Or  . LORazepam (ATIVAN) injection 1 mg  1 mg Intravenous Q6H PRN Larey Dresser, MD      . losartan (COZAAR) tablet 25 mg  25 mg Oral Daily Lorretta Harp, MD      . morphine 2 MG/ML injection 2 mg  2 mg Intravenous Q1H PRN Lorretta Harp, MD   2 mg at 11/04/13 2536  . multivitamin with minerals tablet 1 tablet  1 tablet Oral Daily Larey Dresser, MD      . oxyCODONE (Oxy IR/ROXICODONE) immediate release tablet 20 mg   20 mg Oral Q4H PRN Lorretta Harp, MD   20 mg at 11/04/13 0334  . promethazine (PHENERGAN) injection 25 mg  25 mg Intravenous Once Lorretta Harp, MD      . propranolol (INDERAL) tablet 20 mg  20 mg Oral BID Lorretta Harp, MD   20 mg at 11/03/13 2243  . thiamine (VITAMIN B-1) tablet 100 mg  100 mg Oral Daily Larey Dresser, MD       Or  . thiamine (B-1) injection 100 mg  100 mg Intravenous Daily Larey Dresser, MD        PE: General appearance: alert, cooperative and no distress Lungs: Left wheeze Heart: regular rate and rhythm, S1, S2 normal, no murmur, click, rub or gallop Extremities: No LEE Pulses: 2+ right DP.  left pedal pulses 0.  Feet warm. Skin: Warm and dry.  Right groin tender and mild hematoma.  No bruit and minimal ecchymosis. Neurologic: Grossly normal,  Upper extremity tremors.   Lab Results:   Recent Labs  11/03/13 0651 11/04/13 0330  WBC 5.7 6.3  HGB 15.7 15.0  HCT 45.6 43.1  PLT 216 204   BMET  Recent Labs  11/04/13 0330  NA 135*  K 3.9  CL 96  CO2 26  GLUCOSE 107*  BUN 6  CREATININE 0.71  CALCIUM 9.3   PT/INR  Recent Labs  11/03/13 0651  LABPROT 11.4*  INR 0.84    Assessment/Plan   Active Problems:   Critical lower limb ischemia - poorly healing ulcer on left foot with occluded iliac, femoral artery on left  Plan: SP successful PTA and stenting of a high-grade calcified distal left common iliac artery stenosis for critical limb ischemia.   Left common femoral artery endarterectomy and vein patch planned for the 16th with Dr. Scot Dock.   BP labile.  ASA.  cozaar 25, propranolol 20bid.  DC home today.    LOS: 1 day    Tarri Fuller PA-C 11/04/2013 7:18 AM

## 2013-11-05 ENCOUNTER — Other Ambulatory Visit: Payer: Self-pay

## 2013-11-07 ENCOUNTER — Encounter (HOSPITAL_COMMUNITY): Payer: Self-pay | Admitting: Pharmacy Technician

## 2013-11-10 ENCOUNTER — Encounter (HOSPITAL_COMMUNITY): Payer: Self-pay | Admitting: *Deleted

## 2013-11-10 MED ORDER — DEXTROSE 5 % IV SOLN
1.5000 g | INTRAVENOUS | Status: AC
  Administered 2013-11-11: 1.5 g via INTRAVENOUS
  Filled 2013-11-10: qty 1.5

## 2013-11-10 MED ORDER — SODIUM CHLORIDE 0.9 % IV SOLN: INTRAVENOUS | Status: DC

## 2013-11-11 ENCOUNTER — Encounter (HOSPITAL_COMMUNITY)
Admission: RE | Disposition: A | Discharge status: Home / Self Care | Payer: Self-pay | Source: Ambulatory Visit | Attending: Vascular Surgery

## 2013-11-11 ENCOUNTER — Telehealth: Payer: Self-pay | Admitting: Vascular Surgery

## 2013-11-11 ENCOUNTER — Ambulatory Visit (HOSPITAL_COMMUNITY): Payer: BC Managed Care – PPO

## 2013-11-11 ENCOUNTER — Encounter (HOSPITAL_COMMUNITY): Payer: BC Managed Care – PPO | Admitting: Anesthesiology

## 2013-11-11 ENCOUNTER — Ambulatory Visit (HOSPITAL_COMMUNITY): Payer: BC Managed Care – PPO | Admitting: Anesthesiology

## 2013-11-11 ENCOUNTER — Inpatient Hospital Stay (HOSPITAL_COMMUNITY)
Admission: RE | Admit: 2013-11-11 | Discharge: 2013-11-12 | DRG: 254 | Disposition: A | Discharge status: Home / Self Care | Payer: BC Managed Care – PPO | Source: Ambulatory Visit | Attending: Vascular Surgery | Admitting: Vascular Surgery

## 2013-11-11 DIAGNOSIS — L98499 Non-pressure chronic ulcer of skin of other sites with unspecified severity: Secondary | ICD-10-CM

## 2013-11-11 DIAGNOSIS — I739 Peripheral vascular disease, unspecified: Secondary | ICD-10-CM | POA: Diagnosis present

## 2013-11-11 DIAGNOSIS — Z823 Family history of stroke: Secondary | ICD-10-CM

## 2013-11-11 DIAGNOSIS — I70229 Atherosclerosis of native arteries of extremities with rest pain, unspecified extremity: Secondary | ICD-10-CM

## 2013-11-11 DIAGNOSIS — M545 Low back pain, unspecified: Secondary | ICD-10-CM | POA: Diagnosis present

## 2013-11-11 DIAGNOSIS — I10 Essential (primary) hypertension: Secondary | ICD-10-CM | POA: Diagnosis present

## 2013-11-11 DIAGNOSIS — I70219 Atherosclerosis of native arteries of extremities with intermittent claudication, unspecified extremity: Principal | ICD-10-CM | POA: Diagnosis present

## 2013-11-11 DIAGNOSIS — Z8249 Family history of ischemic heart disease and other diseases of the circulatory system: Secondary | ICD-10-CM

## 2013-11-11 DIAGNOSIS — G8929 Other chronic pain: Secondary | ICD-10-CM | POA: Diagnosis present

## 2013-11-11 DIAGNOSIS — I998 Other disorder of circulatory system: Secondary | ICD-10-CM

## 2013-11-11 DIAGNOSIS — E785 Hyperlipidemia, unspecified: Secondary | ICD-10-CM | POA: Diagnosis present

## 2013-11-11 DIAGNOSIS — F17200 Nicotine dependence, unspecified, uncomplicated: Secondary | ICD-10-CM | POA: Diagnosis present

## 2013-11-11 HISTORY — PX: ENDARTERECTOMY FEMORAL: SHX5804

## 2013-11-11 LAB — CBC
HEMATOCRIT: 38.4 % — AB (ref 39.0–52.0)
Hemoglobin: 13.4 g/dL (ref 13.0–17.0)
MCH: 34.4 pg — ABNORMAL HIGH (ref 26.0–34.0)
MCHC: 34.9 g/dL (ref 30.0–36.0)
MCV: 98.5 fL (ref 78.0–100.0)
Platelets: 292 10*3/uL (ref 150–400)
RBC: 3.9 MIL/uL — ABNORMAL LOW (ref 4.22–5.81)
RDW: 12.5 % (ref 11.5–15.5)
WBC: 5.2 10*3/uL (ref 4.0–10.5)

## 2013-11-11 LAB — URINALYSIS, ROUTINE W REFLEX MICROSCOPIC
Bilirubin Urine: NEGATIVE
Glucose, UA: NEGATIVE mg/dL
Hgb urine dipstick: NEGATIVE
Ketones, ur: NEGATIVE mg/dL
LEUKOCYTES UA: NEGATIVE
NITRITE: NEGATIVE
Protein, ur: NEGATIVE mg/dL
SPECIFIC GRAVITY, URINE: 1.016 (ref 1.005–1.030)
UROBILINOGEN UA: 1 mg/dL (ref 0.0–1.0)
pH: 5.5 (ref 5.0–8.0)

## 2013-11-11 LAB — COMPREHENSIVE METABOLIC PANEL
ALK PHOS: 82 U/L (ref 39–117)
ALT: 102 U/L — ABNORMAL HIGH (ref 0–53)
AST: 60 U/L — ABNORMAL HIGH (ref 0–37)
Albumin: 3.2 g/dL — ABNORMAL LOW (ref 3.5–5.2)
BUN: 10 mg/dL (ref 6–23)
CALCIUM: 9.3 mg/dL (ref 8.4–10.5)
CO2: 28 meq/L (ref 19–32)
Chloride: 97 mEq/L (ref 96–112)
Creatinine, Ser: 0.85 mg/dL (ref 0.50–1.35)
GFR calc non Af Amer: >90 mL/min (ref >90)
Glucose, Bld: 99 mg/dL (ref 70–99)
Potassium: 4.2 mEq/L (ref 3.7–5.3)
Sodium: 136 mEq/L — ABNORMAL LOW (ref 137–147)
TOTAL PROTEIN: 6.7 g/dL (ref 6.0–8.3)
Total Bilirubin: 0.4 mg/dL (ref 0.3–1.2)

## 2013-11-11 LAB — PROTIME-INR
INR: 0.88 (ref 0.00–1.49)
PROTHROMBIN TIME: 11.8 s (ref 11.6–15.2)

## 2013-11-11 LAB — SURGICAL PCR SCREEN
MRSA, PCR: NEGATIVE
Staphylococcus aureus: POSITIVE — AB

## 2013-11-11 LAB — APTT: aPTT: 28 seconds (ref 24–37)

## 2013-11-11 SURGERY — ENDARTERECTOMY, FEMORAL
Anesthesia: General | Site: Leg Upper | Laterality: Left

## 2013-11-11 MED ORDER — HYDRALAZINE HCL 20 MG/ML IJ SOLN: 10.0000 mg | INTRAMUSCULAR | Status: DC | PRN

## 2013-11-11 MED ORDER — CHLORHEXIDINE GLUCONATE 4 % EX LIQD
60.0000 mL | Freq: Once | CUTANEOUS | Status: DC
Start: 2013-11-11 — End: 2013-11-11

## 2013-11-11 MED ORDER — OXYCODONE HCL 20 MG PO TABS: 20.0000 mg | ORAL_TABLET | ORAL | Status: AC | PRN

## 2013-11-11 MED ORDER — PROTAMINE SULFATE 10 MG/ML IV SOLN
INTRAVENOUS | Status: DC | PRN
  Administered 2013-11-11: 40 mg via INTRAVENOUS

## 2013-11-11 MED ORDER — ALPRAZOLAM 0.5 MG PO TABS
1.0000 mg | ORAL_TABLET | Freq: Every evening | ORAL | Status: DC | PRN
  Administered 2013-11-12: 1 mg via ORAL
  Filled 2013-11-11: qty 2

## 2013-11-11 MED ORDER — SODIUM CHLORIDE 0.9 % IV SOLN
10.0000 mg | INTRAVENOUS | Status: DC | PRN
  Administered 2013-11-11: 10 ug/min via INTRAVENOUS

## 2013-11-11 MED ORDER — ONDANSETRON HCL 4 MG/2ML IJ SOLN
INTRAMUSCULAR | Status: AC
  Filled 2013-11-11: qty 2

## 2013-11-11 MED ORDER — DEXTROSE 5 % IV SOLN
1.5000 g | Freq: Two times a day (BID) | INTRAVENOUS | Status: DC
  Administered 2013-11-11: 1.5 g via INTRAVENOUS
  Filled 2013-11-11 (×2): qty 1.5

## 2013-11-11 MED ORDER — MUPIROCIN 2 % EX OINT
TOPICAL_OINTMENT | Freq: Two times a day (BID) | CUTANEOUS | Status: DC
Start: 2013-11-11 — End: 2013-11-11

## 2013-11-11 MED ORDER — ONDANSETRON HCL 4 MG/2ML IJ SOLN
4.0000 mg | Freq: Four times a day (QID) | INTRAMUSCULAR | Status: DC | PRN
Start: 2013-11-11 — End: 2013-11-12

## 2013-11-11 MED ORDER — ALUM & MAG HYDROXIDE-SIMETH 200-200-20 MG/5ML PO SUSP: 15.0000 mL | ORAL | Status: DC | PRN

## 2013-11-11 MED ORDER — CHLORHEXIDINE GLUCONATE 4 % EX LIQD: 60.0000 mL | Freq: Once | CUTANEOUS | Status: DC

## 2013-11-11 MED ORDER — SODIUM CHLORIDE 0.9 % IV SOLN
INTRAVENOUS | Status: DC
  Administered 2013-11-11: 30 mL via INTRAVENOUS

## 2013-11-11 MED ORDER — OXYCODONE HCL 5 MG PO TABS
20.0000 mg | ORAL_TABLET | ORAL | Status: DC | PRN
  Administered 2013-11-11: 20 mg via ORAL

## 2013-11-11 MED ORDER — PROPOFOL 10 MG/ML IV BOLUS
INTRAVENOUS | Status: AC
  Filled 2013-11-11: qty 20

## 2013-11-11 MED ORDER — FOLIC ACID 1 MG PO TABS
1.0000 mg | ORAL_TABLET | Freq: Every day | ORAL | Status: DC
  Filled 2013-11-11: qty 1

## 2013-11-11 MED ORDER — SODIUM CHLORIDE 0.9 % IV SOLN: 500.0000 mL | Freq: Once | INTRAVENOUS | Status: AC | PRN

## 2013-11-11 MED ORDER — ASPIRIN EC 325 MG PO TBEC: 325.0000 mg | DELAYED_RELEASE_TABLET | Freq: Every day | ORAL | Status: DC

## 2013-11-11 MED ORDER — GABAPENTIN 300 MG PO CAPS
300.0000 mg | ORAL_CAPSULE | Freq: Three times a day (TID) | ORAL | Status: DC
  Administered 2013-11-11: 300 mg via ORAL
  Filled 2013-11-11 (×4): qty 1

## 2013-11-11 MED ORDER — ACETAMINOPHEN 650 MG RE SUPP: 325.0000 mg | RECTAL | Status: DC | PRN

## 2013-11-11 MED ORDER — HYDROMORPHONE HCL PF 1 MG/ML IJ SOLN
INTRAMUSCULAR | Status: AC
  Filled 2013-11-11: qty 1

## 2013-11-11 MED ORDER — MORPHINE SULFATE 4 MG/ML IJ SOLN
INTRAMUSCULAR | Status: AC
Start: 2013-11-11 — End: 2013-11-12
  Filled 2013-11-11: qty 1

## 2013-11-11 MED ORDER — ALBUTEROL SULFATE (2.5 MG/3ML) 0.083% IN NEBU: 3.0000 mL | INHALATION_SOLUTION | RESPIRATORY_TRACT | Status: DC | PRN

## 2013-11-11 MED ORDER — OXYCODONE HCL 5 MG PO TABS: 5.0000 mg | ORAL_TABLET | Freq: Once | ORAL | Status: DC | PRN

## 2013-11-11 MED ORDER — VITAMIN B-1 100 MG PO TABS
100.0000 mg | ORAL_TABLET | Freq: Every day | ORAL | Status: DC
  Administered 2013-11-11: 100 mg via ORAL
  Filled 2013-11-11 (×2): qty 1

## 2013-11-11 MED ORDER — MORPHINE SULFATE 2 MG/ML IJ SOLN
2.0000 mg | INTRAMUSCULAR | Status: DC | PRN
  Administered 2013-11-11 (×2): 4 mg via INTRAVENOUS
  Filled 2013-11-11: qty 2

## 2013-11-11 MED ORDER — THROMBIN 20000 UNITS EX SOLR
CUTANEOUS | Status: AC
  Filled 2013-11-11: qty 20000

## 2013-11-11 MED ORDER — SPIRITUS FRUMENTI
1.0000 | Freq: Every day | ORAL | Status: DC
  Filled 2013-11-11 (×4): qty 2

## 2013-11-11 MED ORDER — LOSARTAN POTASSIUM 25 MG PO TABS
25.0000 mg | ORAL_TABLET | Freq: Every day | ORAL | Status: DC
  Administered 2013-11-11: 25 mg via ORAL
  Filled 2013-11-11 (×2): qty 1

## 2013-11-11 MED ORDER — ACETAMINOPHEN 160 MG/5ML PO SOLN
325.0000 mg | ORAL | Status: DC | PRN
  Filled 2013-11-11: qty 20.3

## 2013-11-11 MED ORDER — PROPRANOLOL HCL 20 MG PO TABS
20.0000 mg | ORAL_TABLET | Freq: Two times a day (BID) | ORAL | Status: DC
  Administered 2013-11-11: 20 mg via ORAL
  Filled 2013-11-11 (×3): qty 1

## 2013-11-11 MED ORDER — ACETAMINOPHEN 325 MG PO TABS: 325.0000 mg | ORAL_TABLET | ORAL | Status: DC | PRN

## 2013-11-11 MED ORDER — GLYCOPYRROLATE 0.2 MG/ML IJ SOLN
INTRAMUSCULAR | Status: DC | PRN
  Administered 2013-11-11: 0.6 mg via INTRAVENOUS

## 2013-11-11 MED ORDER — SODIUM CHLORIDE 0.9 % IR SOLN
Status: DC | PRN
  Administered 2013-11-11: 09:00:00

## 2013-11-11 MED ORDER — NEOSTIGMINE METHYLSULFATE 10 MG/10ML IV SOLN
INTRAVENOUS | Status: DC | PRN
  Administered 2013-11-11: 4 mg via INTRAVENOUS

## 2013-11-11 MED ORDER — LACTATED RINGERS IV SOLN
INTRAVENOUS | Status: DC | PRN
  Administered 2013-11-11: 07:00:00 via INTRAVENOUS

## 2013-11-11 MED ORDER — OXYCODONE HCL 5 MG PO TABS
ORAL_TABLET | ORAL | Status: AC
  Filled 2013-11-11: qty 4

## 2013-11-11 MED ORDER — LABETALOL HCL 5 MG/ML IV SOLN: 10.0000 mg | INTRAVENOUS | Status: DC | PRN

## 2013-11-11 MED ORDER — 0.9 % SODIUM CHLORIDE (POUR BTL) OPTIME
TOPICAL | Status: DC | PRN
  Administered 2013-11-11 (×3): 1000 mL

## 2013-11-11 MED ORDER — DOPAMINE-DEXTROSE 3.2-5 MG/ML-% IV SOLN: 3.0000 ug/kg/min | INTRAVENOUS | Status: DC

## 2013-11-11 MED ORDER — DOCUSATE SODIUM 100 MG PO CAPS: 100.0000 mg | ORAL_CAPSULE | Freq: Every day | ORAL | Status: DC

## 2013-11-11 MED ORDER — FENTANYL CITRATE 0.05 MG/ML IJ SOLN
INTRAMUSCULAR | Status: DC | PRN
  Administered 2013-11-11: 150 ug via INTRAVENOUS
  Administered 2013-11-11 (×2): 50 ug via INTRAVENOUS

## 2013-11-11 MED ORDER — ARTIFICIAL TEARS OP OINT
TOPICAL_OINTMENT | OPHTHALMIC | Status: DC | PRN
  Administered 2013-11-11: 1 via OPHTHALMIC

## 2013-11-11 MED ORDER — ONDANSETRON HCL 4 MG/2ML IJ SOLN
INTRAMUSCULAR | Status: DC | PRN
  Administered 2013-11-11: 4 mg via INTRAVENOUS

## 2013-11-11 MED ORDER — ROCURONIUM BROMIDE 100 MG/10ML IV SOLN
INTRAVENOUS | Status: DC | PRN
  Administered 2013-11-11: 50 mg via INTRAVENOUS
  Administered 2013-11-11: 10 mg via INTRAVENOUS

## 2013-11-11 MED ORDER — ENOXAPARIN SODIUM 40 MG/0.4ML ~~LOC~~ SOLN
40.0000 mg | SUBCUTANEOUS | Status: DC
  Filled 2013-11-11: qty 0.4

## 2013-11-11 MED ORDER — METOPROLOL TARTRATE 1 MG/ML IV SOLN: 2.0000 mg | INTRAVENOUS | Status: DC | PRN

## 2013-11-11 MED ORDER — MIDAZOLAM HCL 2 MG/2ML IJ SOLN
INTRAMUSCULAR | Status: AC
  Filled 2013-11-11: qty 2

## 2013-11-11 MED ORDER — MIDAZOLAM HCL 5 MG/5ML IJ SOLN
INTRAMUSCULAR | Status: DC | PRN
  Administered 2013-11-11: 2 mg via INTRAVENOUS

## 2013-11-11 MED ORDER — LIDOCAINE HCL (CARDIAC) 20 MG/ML IV SOLN
INTRAVENOUS | Status: DC | PRN
  Administered 2013-11-11: 80 mg via INTRAVENOUS

## 2013-11-11 MED ORDER — HEPARIN SODIUM (PORCINE) 1000 UNIT/ML IJ SOLN
INTRAMUSCULAR | Status: AC
  Filled 2013-11-11: qty 1

## 2013-11-11 MED ORDER — OXYCODONE HCL 5 MG/5ML PO SOLN: 5.0000 mg | Freq: Once | ORAL | Status: DC | PRN

## 2013-11-11 MED ORDER — PROTAMINE SULFATE 10 MG/ML IV SOLN
INTRAVENOUS | Status: AC
  Filled 2013-11-11: qty 5

## 2013-11-11 MED ORDER — HEPARIN SODIUM (PORCINE) 1000 UNIT/ML IJ SOLN
INTRAMUSCULAR | Status: DC | PRN
  Administered 2013-11-11: 7 mL via INTRAVENOUS

## 2013-11-11 MED ORDER — PROPOFOL 10 MG/ML IV BOLUS
INTRAVENOUS | Status: DC | PRN
  Administered 2013-11-11: 160 mg via INTRAVENOUS

## 2013-11-11 MED ORDER — PHENOL 1.4 % MT LIQD: 1.0000 | OROMUCOSAL | Status: DC | PRN

## 2013-11-11 MED ORDER — OXYCODONE HCL 20 MG PO TABS: 20.0000 mg | ORAL_TABLET | ORAL | Status: DC | PRN

## 2013-11-11 MED ORDER — LORAZEPAM 1 MG PO TABS
1.0000 mg | ORAL_TABLET | Freq: Two times a day (BID) | ORAL | Status: DC
  Administered 2013-11-11 (×2): 1 mg via ORAL
  Filled 2013-11-11: qty 1

## 2013-11-11 MED ORDER — GUAIFENESIN-DM 100-10 MG/5ML PO SYRP: 15.0000 mL | ORAL_SOLUTION | ORAL | Status: DC | PRN

## 2013-11-11 MED ORDER — PANTOPRAZOLE SODIUM 40 MG PO TBEC
40.0000 mg | DELAYED_RELEASE_TABLET | Freq: Every day | ORAL | Status: DC
  Administered 2013-11-11: 40 mg via ORAL
  Filled 2013-11-11: qty 1

## 2013-11-11 MED ORDER — HYDROMORPHONE HCL PF 1 MG/ML IJ SOLN
0.2500 mg | INTRAMUSCULAR | Status: DC | PRN
  Administered 2013-11-11: 1 mg via INTRAVENOUS

## 2013-11-11 MED ORDER — PHENYLEPHRINE HCL 10 MG/ML IJ SOLN
INTRAMUSCULAR | Status: DC | PRN
  Administered 2013-11-11: 120 ug via INTRAVENOUS
  Administered 2013-11-11: 80 ug via INTRAVENOUS

## 2013-11-11 MED ORDER — FENTANYL CITRATE 0.05 MG/ML IJ SOLN
INTRAMUSCULAR | Status: AC
Start: 2013-11-11 — End: 2013-11-11
  Filled 2013-11-11: qty 5

## 2013-11-11 MED ORDER — MUPIROCIN 2 % EX OINT
TOPICAL_OINTMENT | CUTANEOUS | Status: AC
  Administered 2013-11-11: 1 via NASAL
  Filled 2013-11-11: qty 22

## 2013-11-11 MED ORDER — POTASSIUM CHLORIDE CRYS ER 20 MEQ PO TBCR: 20.0000 meq | EXTENDED_RELEASE_TABLET | Freq: Every day | ORAL | Status: DC | PRN

## 2013-11-11 MED ORDER — PROMETHAZINE HCL 25 MG/ML IJ SOLN: 6.2500 mg | INTRAMUSCULAR | Status: DC | PRN

## 2013-11-11 MED ORDER — CLOPIDOGREL BISULFATE 75 MG PO TABS
75.0000 mg | ORAL_TABLET | Freq: Every day | ORAL | Status: DC
  Filled 2013-11-11 (×2): qty 1

## 2013-11-11 SURGICAL SUPPLY — 50 items
BANDAGE ELASTIC 4 VELCRO ST LF (GAUZE/BANDAGES/DRESSINGS) IMPLANT
BLADE 10 SAFETY STRL DISP (BLADE) ×3 IMPLANT
CANISTER SUCTION 2500CC (MISCELLANEOUS) ×3 IMPLANT
CLIP TI MEDIUM 24 (CLIP) ×3 IMPLANT
CLIP TI WIDE RED SMALL 24 (CLIP) ×3 IMPLANT
COVER SURGICAL LIGHT HANDLE (MISCELLANEOUS) ×3 IMPLANT
DERMABOND ADHESIVE PROPEN (GAUZE/BANDAGES/DRESSINGS) ×2
DERMABOND ADVANCED .7 DNX6 (GAUZE/BANDAGES/DRESSINGS) ×1 IMPLANT
DRAIN CHANNEL 15F RND FF W/TCR (WOUND CARE) IMPLANT
DRAPE WARM FLUID 44X44 (DRAPE) ×3 IMPLANT
DRSG COVADERM 4X8 (GAUZE/BANDAGES/DRESSINGS) IMPLANT
ELECT REM PT RETURN 9FT ADLT (ELECTROSURGICAL) ×3
ELECTRODE REM PT RTRN 9FT ADLT (ELECTROSURGICAL) ×1 IMPLANT
EVACUATOR SILICONE 100CC (DRAIN) IMPLANT
GLOVE BIO SURGEON STRL SZ 6.5 (GLOVE) ×6 IMPLANT
GLOVE BIO SURGEON STRL SZ7.5 (GLOVE) ×3 IMPLANT
GLOVE BIO SURGEONS STRL SZ 6.5 (GLOVE) ×3
GLOVE BIOGEL PI IND STRL 6.5 (GLOVE) ×1 IMPLANT
GLOVE BIOGEL PI IND STRL 7.0 (GLOVE) ×1 IMPLANT
GLOVE BIOGEL PI IND STRL 7.5 (GLOVE) ×1 IMPLANT
GLOVE BIOGEL PI IND STRL 8 (GLOVE) ×1 IMPLANT
GLOVE BIOGEL PI INDICATOR 6.5 (GLOVE) ×2
GLOVE BIOGEL PI INDICATOR 7.0 (GLOVE) ×2
GLOVE BIOGEL PI INDICATOR 7.5 (GLOVE) ×2
GLOVE BIOGEL PI INDICATOR 8 (GLOVE) ×2
GLOVE SKINSENSE NS SZ6.5 (GLOVE) ×2
GLOVE SKINSENSE NS SZ7.0 (GLOVE) ×2
GLOVE SKINSENSE STRL SZ6.5 (GLOVE) ×1 IMPLANT
GLOVE SKINSENSE STRL SZ7.0 (GLOVE) ×1 IMPLANT
GOWN STRL REUS W/ TWL LRG LVL3 (GOWN DISPOSABLE) ×6 IMPLANT
GOWN STRL REUS W/TWL LRG LVL3 (GOWN DISPOSABLE) ×12
KIT BASIN OR (CUSTOM PROCEDURE TRAY) ×3 IMPLANT
KIT ROOM TURNOVER OR (KITS) ×3 IMPLANT
NS IRRIG 1000ML POUR BTL (IV SOLUTION) ×6 IMPLANT
PACK PERIPHERAL VASCULAR (CUSTOM PROCEDURE TRAY) ×3 IMPLANT
PAD ARMBOARD 7.5X6 YLW CONV (MISCELLANEOUS) ×6 IMPLANT
PATCH VASCULAR VASCU GUARD 1X6 (Vascular Products) ×3 IMPLANT
SPONGE SURGIFOAM ABS GEL 100 (HEMOSTASIS) IMPLANT
STAPLER VISISTAT (STAPLE) IMPLANT
SUT PROLENE 5 0 C 1 24 (SUTURE) ×3 IMPLANT
SUT PROLENE 6 0 BV (SUTURE) ×3 IMPLANT
SUT VIC AB 2-0 CTB1 (SUTURE) ×3 IMPLANT
SUT VIC AB 3-0 SH 27 (SUTURE) ×2
SUT VIC AB 3-0 SH 27X BRD (SUTURE) ×1 IMPLANT
SUT VICRYL 4-0 PS2 18IN ABS (SUTURE) ×3 IMPLANT
TOWEL OR 17X24 6PK STRL BLUE (TOWEL DISPOSABLE) ×6 IMPLANT
TOWEL OR 17X26 10 PK STRL BLUE (TOWEL DISPOSABLE) ×6 IMPLANT
TRAY FOLEY CATH 16FRSI W/METER (SET/KITS/TRAYS/PACK) IMPLANT
UNDERPAD 30X30 INCONTINENT (UNDERPADS AND DIAPERS) ×3 IMPLANT
WATER STERILE IRR 1000ML POUR (IV SOLUTION) ×3 IMPLANT

## 2013-11-11 NOTE — Progress Notes (Signed)
Seen at bedside by dr Scot Dock for incisional discomfort and llq pain. No changes in rx plan. ALso seen/evald fro tremors, dr Scot Dock  Also spoke with him re etoh use/ ativan po ordered

## 2013-11-11 NOTE — Progress Notes (Signed)
   VASCULAR PROGRESS NOTE  SUBJECTIVE: No complaints  PHYSICAL EXAM: Filed Vitals:   11/11/13 1018 11/11/13 1100 11/11/13 1215 11/11/13 1230  BP: 134/69 139/64 107/63   Pulse:  76 72 72  Temp: 97.8 F (36.6 C)  97 F (36.1 C)   TempSrc:      Resp:  13 11 15   SpO2:  94% 92% 93%   Incision looks fine Left foot warm Good DP and PT with doppler  LABS: Lab Results  Component Value Date   WBC 5.2 11/11/2013   HGB 13.4 11/11/2013   HCT 38.4* 11/11/2013   MCV 98.5 11/11/2013   PLT 292 11/11/2013   Lab Results  Component Value Date   CREATININE 0.85 11/11/2013   Lab Results  Component Value Date   INR 0.88 11/11/2013   CBG (last 3)  No results found for this basename: GLUCAP,  in the last 72 hours  Active Problems:   PAD (peripheral artery disease)  ASSESSMENT AND PLAN:  * Stable post op  * Plan D/C tomorrow on Plavix.  Gae Gallop Beeper: [phone] 11/11/2013

## 2013-11-11 NOTE — H&P (View-Only) (Signed)
 [facility] [place]  Patient name: Jimmy Tucker MRN: [id number] DOB: 07/05/1960 Sex: male  REASON FOR CONSULT: Critical limb ischemia with a left common femoral artery occlusion. Consult from Dr. Jonathan Berry.  HPI: Jimmy Tucker is a 53 y.o. male who states that he developed wounds on his left foot approximately 4-5 months ago. He has a wound on the medial aspect of the left great toe and the lateral aspect of the fifth metatarsal head. In addition he describes left hip thigh and calf claudication at a very short distance. His pain is brought on by ambulation and relieved with rest. He also describes some symptoms in the left leg consistent with rest pain.  His risk factors for peripheral vascular disease include hypertension and tobacco use. In addition he has a history of premature cardiovascular disease. He denies any history of diabetes or hypercholesterolemia.  He denies fever or chills.  Past Medical History  Diagnosis Date  . Chronic back pain   . Hypertension   . Anxiety   . Heavy cigarette smoker (20-39 per day)     Unmotivated  . PAD (peripheral artery disease) 10/30/2012    Previous LEA Dopplers in ~2011 demonstrated Left ATA occlusion; Dopplers revealed occlusion of the left common iliac with a short reconstitution at the branch point of the external and internal iliac, the external iliac is occluded through the common femoral and proximal SFA; reconstitutes in the proximal SFA. Two-vessel runoff beyond.  . Hyperlipidemia LDL goal <70     Intolerant to statins   Family History  Problem Relation Age of Onset  . Colon cancer Neg Hx   . Heart attack Father 55    Cardiac arrest  . Sudden death Father   . Stroke Brother 51   SOCIAL HISTORY: History  Substance Use Topics  . Smoking status: Current Every Day Smoker -- 1.00 packs/day for 35 years    Types: Cigarettes  . Smokeless tobacco: Not on file  . Alcohol Use: 12.6 oz/week    21 Cans of  beer per week     Comment: every day    No Known Allergies  Current Facility-Administered Medications  Medication Dose Route Frequency Provider Last Rate Last Dose  . hydrALAZINE (APRESOLINE) injection 10 mg  10 mg Intravenous UD Jonathan J Berry, MD       REVIEW OF SYSTEMS: [X ] denotes positive finding; [  ] denotes negative finding CARDIOVASCULAR:  [ ] chest pain   [ ] chest pressure   [ ] palpitations   [ ] orthopnea   [ ] dyspnea on exertion   [X ] claudication   [X ] rest pain   [ ] DVT   [ ] phlebitis PULMONARY:   [ ] productive cough   [ ] asthma   [X ] wheezing NEUROLOGIC:   [ ] weakness  [ ] paresthesias  [ ] aphasia  [ ] amaurosis  [ ] dizziness HEMATOLOGIC:   [ ] bleeding problems   [ ] clotting disorders MUSCULOSKELETAL:  [ ] joint pain   [ ] joint swelling [ ] leg swelling GASTROINTESTINAL: [ ]  blood in stool  [ ]  hematemesis GENITOURINARY:  [ ]  dysuria  [ ]  hematuria PSYCHIATRIC:  [ ] history of major depression INTEGUMENTARY:  [ ] rashes  [X ] ulcers CONSTITUTIONAL:  [ ] fever   [ ] chills  PHYSICAL EXAM: Filed Vitals:   11/03/13 0531  BP:   161/89  Pulse: 97  Temp: 98.3 F (36.8 C)  TempSrc: Oral  Resp: 20  Height: 5' 6" (1.676 m)  Weight: 161 lb (73.029 kg)  SpO2: 98%   Body mass index is 26 kg/(m^2). GENERAL: The patient is a well-nourished male, in no acute distress. The vital signs are documented above. CARDIOVASCULAR: There is a regular rate and rhythm. I do not detect carotid bruits. I cannot assess his right femoral pulse as he has a sheath in place. There is no left femoral pulse. On the right side he has a palpable popliteal and dorsalis pedis pulse. I cannot palpate a left popliteal, dorsalis pedis, or posterior tibial pulse. PULMONARY: There is good air exchange bilaterally. He does have wheezing bilaterally. ABDOMEN: Soft and non-tender with normal pitched bowel sounds. I do not appreciate an abdominal aortic aneurysm. MUSCULOSKELETAL: He has  some cyanosis of the toes of the left foot. He has a small wound on the medial aspect of his left great toe and the lateral aspect of his fifth metatarsal head. NEUROLOGIC: No focal weakness or paresthesias are detected. PSYCHIATRIC: The patient has a normal affect.  DATA:  Lab Results  Component Value Date   WBC 5.7 11/03/2013   HGB 15.7 11/03/2013   HCT 45.6 11/03/2013   MCV 98.7 11/03/2013   PLT 216 11/03/2013   Lab Results  Component Value Date   NA 134* 10/30/2013   K 4.9 10/30/2013   CL 98 10/30/2013   CO2 28 10/30/2013   Lab Results  Component Value Date   CREATININE 0.84 10/30/2013   Lab Results  Component Value Date   INR 0.84 11/03/2013   INR 0.91 10/30/2013   I have reviewed his arteriogram which was performed today. On the right side, he does have some calcific plaque in the distal right common iliac artery. He also is disease of the distal popliteal on the right and peroneal artery. Dominant runoff on the right is the anterior tibial and posterior tibial arteries.  On the left side, which is the symptomatic side, he underwent angioplasty and stenting of a left common and external iliac artery stenosis with an excellent result. He has a occlusion of the common femoral artery down to the bifurcation. The superficial femoral artery and deep femoral artery are patent. The popliteal artery is patent. The tibial peroneal trunk and posterior tibial arteries are patent. The anterior tibial artery on the left is occluded. The peroneal artery is patent proximally but occluded distally.  I have reviewed his arterial study which was done at Dr. Berry's office. ABI on the right is 91%. Doppler flow could not be obtained in the left foot.  MEDICAL ISSUES:  PERIPHERAL VASCULAR DISEASE WITH CRITICAL LIMB ISCHEMIA: This patient has an occlusion of the left common femoral artery. I would agree that left common femoral artery endarterectomy and vein patch is indicated. He has undergone successful  angioplasty and stenting of the proximal disease in the left iliac system. He has reasonable runoff distally and I think that a dressing the common femoral artery stenosis should significantly improve his chances of healing the wound on his left foot. We have discussed the importance of tobacco cessation and he states that he quit yesterday. I also encouraged him to take an aspirin a day and we also discussed the use of a statin. However, in reviewing his records apparently in the past she's had some intolerance to statins. His left common femoral endarterectomy scheduled for 11/11/2013. I have discussed indications   for the procedure and the potential complications including but not limited to bleeding, wound healing problems, and infection. All his questions answered and he is agreeable to proceed. He does not want a Foley catheter during the procedure. In addition there may be some concern of alcohol use and he may need DT prophylaxis.  In addition, Dr. [name] would like him started on Plavix after the procedure given the recent iliac stent.  Angelia Mould [facility] [place] Beeper: [phone]

## 2013-11-11 NOTE — Transfer of Care (Signed)
Immediate Anesthesia Transfer of Care Note  Patient: Jimmy Tucker  Procedure(s) Performed: Procedure(s): Left Femoral Endarterectomy with Patch Angioplasty  (Left)  Patient Location: PACU  Anesthesia Type:General  Level of Consciousness: awake, alert , oriented and sedated  Airway & Oxygen Therapy: Patient Spontanous Breathing and Patient connected to nasal cannula oxygen  Post-op Assessment: Report given to PACU RN, Post -op Vital signs reviewed and stable and Patient moving all extremities  Post vital signs: Reviewed and stable  Complications: No apparent anesthesia complications

## 2013-11-11 NOTE — Anesthesia Preprocedure Evaluation (Addendum)
Anesthesia Evaluation  Patient identified by MRN, date of birth, ID band Patient awake    Reviewed: Allergy & Precautions, H&P , NPO status , Patient's Chart, lab work & pertinent test results  History of Anesthesia Complications Negative for: history of anesthetic complications  Airway Mallampati: II TM Distance: >3 FB Neck ROM: Full    Dental  (+) Teeth Intact   Pulmonary neg sleep apnea, COPD COPD inhaler, neg recent URI, Current Smoker, former smoker,  breath sounds clear to auscultation        Cardiovascular hypertension, Pt. on medications - angina+ Peripheral Vascular Disease - Past MI - dysrhythmias - Valvular Problems/MurmursRhythm:Regular     Neuro/Psych PSYCHIATRIC DISORDERS Anxiety Chronic low back pain    GI/Hepatic negative GI ROS, Neg liver ROS,   Endo/Other  negative endocrine ROS  Renal/GU negative Renal ROS     Musculoskeletal   Abdominal   Peds  Hematology negative hematology ROS (+)   Anesthesia Other Findings Anisocoria with OD irregular  Reproductive/Obstetrics                          Anesthesia Physical Anesthesia Plan  ASA: III  Anesthesia Plan: General   Post-op Pain Management:    Induction: Intravenous  Airway Management Planned: Oral ETT  Additional Equipment: None  Intra-op Plan:   Post-operative Plan: Extubation in OR  Informed Consent: I have reviewed the patients History and Physical, chart, labs and discussed the procedure including the risks, benefits and alternatives for the proposed anesthesia with the patient or authorized representative who has indicated his/her understanding and acceptance.   Dental advisory given  Plan Discussed with: CRNA and Surgeon  Anesthesia Plan Comments:         Anesthesia Quick Evaluation

## 2013-11-11 NOTE — Interval H&P Note (Signed)
History and Physical Interval Note:  11/11/2013 7:23 AM  Jimmy Tucker  has presented today for surgery, with the diagnosis of Atherosclerosis of native arteries of the extremities with intermittent claudication  The various methods of treatment have been discussed with the patient and family. After consideration of risks, benefits and other options for treatment, the patient has consented to  Procedure(s): ENDARTERECTOMY FEMORAL WITH VEIN PATCH  (Left) as a surgical intervention .  The patient's history has been reviewed, patient examined, no change in status, stable for surgery.  I have reviewed the patient's chart and labs.  Questions were answered to the patient's satisfaction.     Angelia Mould

## 2013-11-11 NOTE — Progress Notes (Signed)
L 2nd toe reamins slightly dusky/ no pain here

## 2013-11-11 NOTE — Progress Notes (Signed)
Dr Scot Dock notified of l 2nd toe discoloration/purplish  Color and patient having no pain at this toe. Fells it "was there before surgery". Will monitor and call if any additional concerns.

## 2013-11-11 NOTE — OR Nursing (Signed)
No foley placed per Dr Scot Dock order

## 2013-11-11 NOTE — Anesthesia Postprocedure Evaluation (Signed)
  Anesthesia Post-op Note  Patient: Jimmy Tucker  Procedure(s) Performed: Procedure(s): Left Femoral Endarterectomy with Patch Angioplasty  (Left)  Patient Location: PACU  Anesthesia Type:General  Level of Consciousness: awake, alert  and oriented  Airway and Oxygen Therapy: Patient Spontanous Breathing  Post-op Pain: mild  Post-op Assessment: Post-op Vital signs reviewed, Patient's Cardiovascular Status Stable, Respiratory Function Stable, Patent Airway, No signs of Nausea or vomiting and Pain level controlled  Post-op Vital Signs: Reviewed and stable  Last Vitals:  Filed Vitals:   11/11/13 1436  BP:   Pulse: 72  Temp:   Resp: 19    Complications: No apparent anesthesia complications

## 2013-11-11 NOTE — Anesthesia Procedure Notes (Signed)
Procedure Name: Intubation Date/Time: 11/11/2013 8:17 AM Performed by: Scheryl Darter Pre-anesthesia Checklist: Patient identified, Emergency Drugs available, Suction available, Patient being monitored and Timeout performed Patient Re-evaluated:Patient Re-evaluated prior to inductionOxygen Delivery Method: Circle system utilized Preoxygenation: Pre-oxygenation with 100% oxygen Intubation Type: IV induction Ventilation: Mask ventilation without difficulty Laryngoscope Size: Miller and 3 Grade View: Grade II Tube type: Oral Tube size: 7.5 mm Number of attempts: 1 Airway Equipment and Method: Stylet Placement Confirmation: ETT inserted through vocal cords under direct vision,  positive ETCO2 and breath sounds checked- equal and bilateral Secured at: 22 cm Tube secured with: Tape Dental Injury: Teeth and Oropharynx as per pre-operative assessment

## 2013-11-11 NOTE — Op Note (Signed)
    NAME: Jimmy Tucker   MRN: [id number] DOB: 04-01-1961    DATE OF OPERATION: 11/11/2013  PREOP DIAGNOSIS: Critical limb ischemia with left common femoral artery occlusion  POSTOP DIAGNOSIS: same  PROCEDURE: left common femoral artery endarterectomy with bovine pericardial patch angioplasty  SURGEON: Judeth Cornfield. Scot Dock, MD, FACS  ASSIST: Adele Barthel M.D.  ANESTHESIA: Gen.   EBL: minimal  INDICATIONS: [name] is a 54 y.o. male who presented with nonhealing wounds of his left foot. He underwent an arteriogram by Dr. [name] and successful angioplasty and stenting of an iliac stenosis on the left. He had a common femoral artery occlusion and presents for endarterectomy and patch angioplasty.  FINDINGS: the patient had extensive plaque in the left common femoral artery with subacute thrombus present.  TECHNIQUE: The patient was taken to the operating room and received a general anesthetic. The left groin and thigh were prepped and draped in the usual sterile fashion. A longitudinal incision was made in the left groin. The dissection was carried down to the common femoral artery. In order to clamp above the plaque, I dissected well up above the inguinal ligament. The vein was divided between 3-0 silk ties to allow exposure of the distal external iliac artery. Several side branches were controlled with vessel loops. The dissection was carried down onto the superficial femoral artery which was controlled with a vessel loop. The deep femoral artery likewise was controlled. The patient was then heparinized. A bovine pericardial patch had been soaked in saline.  A longitudinal arteriotomy was made in the common femoral artery. This was extended above the plaque into the external iliac artery and then down onto the superficial femoral artery. An endarterectomy plane was established proximally and the plaque was sharply divided. The endarterectomy plane was continued distally and the  plaque extended into the deep femoral artery but was a nice tapering the plaque was a nice endpoint. In the superficial femoral artery had to extend the dissection down onto the superficial femoral artery in order to get an adequate endpoint. The artery was irrigated with copious amounts of heparin and all loose debris removed. A bow hunter cardial patch was then sewn using continuous 5-0 Prolene suture. Prior to completing the patch closure the artery was backbled and flushed properly. Of note there was excellent inflow. The anastomosis was completed and flow reestablished first to the deep femoral artery into the superficial femoral artery. Evidently shows an excellent Doppler signal in the superficial femoral artery beyond the patch and also the deep femoral artery. The heparin was partially reversed with protamine.  The wound was closed with a pair of 2-0 Vicryl. The subcutaneous layer was closed with 3-0 Vicryl. The skin was closed with 4-0 subcuticular stitch. Dermabond was applied. The patient tolerated the procedure well transferred to recovery in stable condition. All needle and sponge counts were correct.  Deitra Mayo, MD, FACS [facility]  DATE OF DICTATION:   11/11/2013

## 2013-11-11 NOTE — Telephone Encounter (Addendum)
Message copied by Gena Fray on Tue Nov 11, 2013  1:55 PM ------      Message from: Mena Goes      Created: Tue Nov 11, 2013 10:27 AM      Regarding: Schedule                   ----- Message -----         From: Gabriel Earing, PA-C         Sent: 11/11/2013   9:52 AM           To: [facility] Charge Pool            S/p left femoral endarterectomy 11/11/13            F/u with Dr. Scot Dock in 2 weeks             ------  11/11/13: left message for patient with appointment date and time, dpm

## 2013-11-12 LAB — TYPE AND SCREEN
ABO/RH(D): O POS
ANTIBODY SCREEN: POSITIVE
DAT, IgG: NEGATIVE
Unit division: 0
Unit division: 0

## 2013-11-12 LAB — BASIC METABOLIC PANEL
BUN: 9 mg/dL (ref 6–23)
CALCIUM: 9.1 mg/dL (ref 8.4–10.5)
CO2: 28 meq/L (ref 19–32)
CREATININE: 0.86 mg/dL (ref 0.50–1.35)
Chloride: 98 mEq/L (ref 96–112)
GFR calc Af Amer: >90 mL/min (ref >90)
GFR calc non Af Amer: >90 mL/min (ref >90)
GLUCOSE: 121 mg/dL — AB (ref 70–99)
Potassium: 4.4 mEq/L (ref 3.7–5.3)
Sodium: 136 mEq/L — ABNORMAL LOW (ref 137–147)

## 2013-11-12 LAB — CBC
HCT: 38.6 % — ABNORMAL LOW (ref 39.0–52.0)
HEMOGLOBIN: 12.9 g/dL — AB (ref 13.0–17.0)
MCH: 33.2 pg (ref 26.0–34.0)
MCHC: 33.4 g/dL (ref 30.0–36.0)
MCV: 99.5 fL (ref 78.0–100.0)
PLATELETS: 249 10*3/uL (ref 150–400)
RBC: 3.88 MIL/uL — ABNORMAL LOW (ref 4.22–5.81)
RDW: 12.5 % (ref 11.5–15.5)
WBC: 7 10*3/uL (ref 4.0–10.5)

## 2013-11-12 NOTE — Progress Notes (Signed)
Pt discharged home per order. Provided discharge instructions-pt and wife verbalized understanding and given signed copies to take home along with prescription for pain medicine. VSS. PIV removed. All belongs packed and sent with wife home.

## 2013-11-12 NOTE — Discharge Summary (Signed)
[facility] Discharge Summary  Jimmy Tucker 01-18-1961 53 y.o. male  [id number]  Admission Date: 11/11/2013  Discharge Date: 11/12/13  Physician: Angelia Mould, MD  Admission Diagnosis: Atherosclerosis of native arteries of the extremities with intermittent claudication   HPI:   This is a 53 y.o. male who states that he developed wounds on his left foot approximately 4-5 months ago. He has a wound on the medial aspect of the left great toe and the lateral aspect of the fifth metatarsal head. In addition he describes left hip thigh and calf claudication at a very short distance. His pain is brought on by ambulation and relieved with rest. He also describes some symptoms in the left leg consistent with rest pain.  His risk factors for peripheral vascular disease include hypertension and tobacco use. In addition he has a history of premature cardiovascular disease. He denies any history of diabetes or hypercholesterolemia.   Hospital Course:  The patient was admitted to the hospital and taken to the operating room on 11/11/2013 and underwent: left common femoral artery endarterectomy with bovine pericardial patch angioplasty    The pt tolerated the procedure well and was transported to the PACU in good condition.   By POD 1, he was doing well and discharged home.  The remainder of the hospital course consisted of inreasing mobilization and increasing intake of solids without difficulty.  CBC    Component Value Date/Time   WBC 7.0 11/12/2013 0120   RBC 3.88* 11/12/2013 0120   HGB 12.9* 11/12/2013 0120   HCT 38.6* 11/12/2013 0120   PLT 249 11/12/2013 0120   MCV 99.5 11/12/2013 0120   MCH 33.2 11/12/2013 0120   MCHC 33.4 11/12/2013 0120   RDW 12.5 11/12/2013 0120    BMET    Component Value Date/Time   NA 136* 11/12/2013 0120   K 4.4 11/12/2013 0120   CL 98 11/12/2013 0120   CO2 28 11/12/2013 0120   GLUCOSE 121* 11/12/2013 0120   BUN 9 11/12/2013 0120   CREATININE 0.86 11/12/2013 0120   CREATININE 0.84 10/30/2013 1116   CALCIUM 9.1 11/12/2013 0120   GFRNONAA >90 11/12/2013 0120   GFRAA >90 11/12/2013 0120     Discharge Instructions:   The patient is discharged to home with extensive instructions on wound care and progressive ambulation.  They are instructed not to drive or perform any heavy lifting until returning to see the physician in his office.  Discharge Instructions   Call MD for:  redness, tenderness, or signs of infection (pain, swelling, bleeding, redness, odor or green/yellow discharge around incision site)    Complete by:  As directed      Call MD for:  severe or increased pain, loss or decreased feeling  in affected limb(s)    Complete by:  As directed      Call MD for:  temperature >100.5    Complete by:  As directed      Discharge wound care:    Complete by:  As directed   Shower daily with soap and water starting 11/13/13     Driving Restrictions    Complete by:  As directed   No driving for 2 weeks     Lifting restrictions    Complete by:  As directed   No lifting for 6 weeks     Resume previous diet    Complete by:  As directed            Discharge Diagnosis:  Atherosclerosis of native arteries of the extremities with intermittent claudication  Secondary Diagnosis: Patient Active Problem List   Diagnosis Date Noted  . PAD (peripheral artery disease) 11/11/2013  . Critical lower limb ischemia - poorly healing ulcer on left foot with occluded iliac, femoral artery on left 10/30/2013  . Other malaise and fatigue 10/30/2013  . Hypertension   . Heavy cigarette smoker (20-39 per day)   . Hyperlipidemia LDL goal <70   . Screening for colon cancer 07/07/2011   Past Medical History  Diagnosis Date  . Chronic back pain   . Hypertension   . Anxiety   . Heavy cigarette smoker (20-39 per day)     Unmotivated  . PAD (peripheral artery disease) 10/30/2012    Previous LEA Dopplers in ~2011 demonstrated Left ATA  occlusion; Dopplers revealed occlusion of the left common iliac with a short reconstitution at the branch point of the external and internal iliac, the external iliac is occluded through the common femoral and proximal SFA; reconstitutes in the proximal SFA. Two-vessel runoff beyond.  . Hyperlipidemia LDL goal <70     Intolerant to statins  . PAD (peripheral artery disease)   . Pollen allergies   . Chronic lower back pain     "L4-5"  . Lumbar disc disease        Medication List         ALPRAZolam 1 MG tablet  Commonly known as:  XANAX  Take 1 mg by mouth at bedtime as needed for anxiety.     aspirin 325 MG EC tablet  Take 1 tablet (325 mg total) by mouth daily.     folic acid 1 MG tablet  Commonly known as:  FOLVITE  Take 1 tablet (1 mg total) by mouth daily.     gabapentin 300 MG capsule  Commonly known as:  NEURONTIN  Take 300 mg by mouth 3 (three) times daily.     losartan 50 MG tablet  Commonly known as:  COZAAR  Take 0.5 tablets (25 mg total) by mouth daily.     Oxycodone HCl 20 MG Tabs  Take 1 tablet (20 mg total) by mouth every 4 (four) hours as needed (pain).     PROAIR HFA 108 (90 BASE) MCG/ACT inhaler  Generic drug:  albuterol  Inhale 2 puffs into the lungs every 4 (four) hours as needed for wheezing or shortness of breath.     propranolol 20 MG tablet  Commonly known as:  INDERAL  Take 20 mg by mouth 2 (two) times daily.     thiamine 100 MG tablet  Take 1 tablet (100 mg total) by mouth daily.        Roxicodone #20 No Refill  Disposition: home  Patient's condition: is Good  Follow up: 1. Dr. Scot Dock in 2 weeks   Leontine Locket, PA-C [facility] [phone] 11/12/2013  8:27 AM  - For VQI Registry use --- Instructions: Press F2 to tab through selections.  Delete question if not applicable.   Post-op:  Wound infection: No  Graft infection: No  Transfusion: No  If yes, n/a units given New Arrhythmia: No Ipsilateral  amputation: No, [ ]  Minor, [ ]  BKA, [ ]  AKA Discharge patency: [x ] Primary, [ ]  Primary assisted, [ ]  Secondary, [ ]  Occluded Patency judged by: [ ]  Dopper only, [ ]  Palpable graft pulse, [ ]  Palpable distal pulse, [ ]  ABI inc. > 0.15, [ ]  Duplex Discharge ABI: R , L  Discharge TBI: R , L  D/C Ambulatory Status: Ambulatory  Complications: MI: No, [ ]  Troponin only, [ ]  EKG or Clinical CHF: No Resp failure:No, [ ]  Pneumonia, [ ]  Ventilator Chg in renal function: No, [ ]  Inc. Cr > 0.5, [ ]  Temp. Dialysis, [ ]  Permanent dialysis Stroke: No, [ ]  Minor, [ ]  Major Return to OR: No  Reason for return to OR: [ ]  Bleeding, [ ]  Infection, [ ]  Thrombosis, [ ]  Revision  Discharge medications: Statin use:  no ASA use:  yes Plavix use:  no Beta blocker use: yes ARB:  yes Coumadin use: no

## 2013-11-12 NOTE — Progress Notes (Signed)
   VASCULAR PROGRESS NOTE  SUBJECTIVE: Foot feels better. Toes still hurt.  PHYSICAL EXAM: Filed Vitals:   11/11/13 2200 11/11/13 2300 11/12/13 0000 11/12/13 0417  BP: 122/65 129/64 132/68 140/69  Pulse: 81 85 81 81  Temp:   98.6 F (37 C) 97.6 F (36.4 C)  TempSrc:   Oral Oral  Resp: 18 21 19 17   Height:      Weight:      SpO2: 96% 96% 96% 97%   Left foot hyperemic Brisk DP and PT with doppler Left groin incision looks fine.  LABS: Lab Results  Component Value Date   WBC 7.0 11/12/2013   HGB 12.9* 11/12/2013   HCT 38.6* 11/12/2013   MCV 99.5 11/12/2013   PLT 249 11/12/2013   Lab Results  Component Value Date   CREATININE 0.86 11/12/2013   Lab Results  Component Value Date   INR 0.88 11/11/2013   Active Problems:   PAD (peripheral artery disease)   ASSESSMENT AND PLAN:  * 1 Day Post-Op s/p: Left CFA endarterectomy with patch angioplasty  *  D/C today.  Gae Gallop Beeper: [phone] 11/12/2013

## 2013-11-12 NOTE — Progress Notes (Signed)
Utilization review completed.  

## 2013-11-14 ENCOUNTER — Encounter (HOSPITAL_COMMUNITY): Payer: Self-pay | Admitting: Vascular Surgery

## 2013-11-18 ENCOUNTER — Ambulatory Visit: Payer: Self-pay | Admitting: Podiatry

## 2013-11-18 NOTE — Discharge Summary (Signed)
Agree with plans for D/C  Christopher Dickson, MD, FACS Beeper [phone] 11/18/2013  

## 2013-11-21 ENCOUNTER — Telehealth: Payer: Self-pay | Admitting: *Deleted

## 2013-11-21 NOTE — Telephone Encounter (Signed)
Called patient regarding foot swelling. He has been ambulating with no difficulty and has been elevating his foot when resting. He reports no drainage, erythema, or skin discoloration in his groin wound. He does report a small amount of nonpainful swelling on both sides of his incision; he is afebrile. His foot is warm to the touch and his foot ulcers are doing well. I instructed him on proper foot elevation techniques and he will call if he has any more problems prior to his postop appt on 11-26-13.

## 2013-11-21 NOTE — Telephone Encounter (Signed)
Message copied by Mena Goes on Fri Nov 21, 2013  2:38 PM ------      Message from: Gena Fray      Created: Fri Nov 21, 2013 10:47 AM      Regarding: Triage      Contact: [phone]       Please call Mr [place] at [phone]. He called to confirm his follow up appointment for post op on 06/03 with [facility]. He mentioned that he is having some foot swelling since the procedure. He states that he has been keeping it elevated. He said his groin incision looks like it is healing nicely, but his foot remains swollen.             Thanks!      Hinton Dyer ------

## 2013-11-26 ENCOUNTER — Encounter: Payer: BC Managed Care – PPO | Admitting: Vascular Surgery

## 2013-11-26 ENCOUNTER — Encounter: Payer: Self-pay | Admitting: Vascular Surgery

## 2013-11-27 ENCOUNTER — Ambulatory Visit (INDEPENDENT_AMBULATORY_CARE_PROVIDER_SITE_OTHER): Payer: Self-pay | Admitting: Vascular Surgery

## 2013-11-27 ENCOUNTER — Encounter: Payer: Self-pay | Admitting: Vascular Surgery

## 2013-11-27 VITALS — BP 145/79 | HR 67 | Resp 16 | Ht 66.0 in | Wt 171.0 lb

## 2013-11-27 DIAGNOSIS — I739 Peripheral vascular disease, unspecified: Secondary | ICD-10-CM

## 2013-11-27 NOTE — Progress Notes (Signed)
   Patient name: Jimmy Tucker MRN: [id number] DOB: 1961-04-24 Sex: male  REASON FOR VISIT: Follow up after left common femoral artery endarterectomy  HPI:  Jimmy Tucker is a 53 y.o. male who presented with nonhealing wounds of his left foot. He underwent an arteriogram by Dr. [name] and successful angioplasty and stenting of an iliac stenosis on the left. He had a left common femoral artery occlusion and underwent elective left common femoral artery endarterectomy with bovine pericardial patch angioplasty on 11/11/2013. He comes in for his first outpatient visit.  He has no specific complaints.  He has been ambulating without difficulty. He denies fever or chills.  REVIEW OF SYSTEMS: Valu.Nieves ] denotes positive finding; [  ] denotes negative finding  CARDIOVASCULAR:  [ ]  chest pain   [ ]  dyspnea on exertion    CONSTITUTIONAL:  [ ]  fever   [ ]  chills  PHYSICAL EXAM: Filed Vitals:   11/27/13 1305  BP: 145/79  Pulse: 67  Resp: 16  Height: 5\' 6"  (1.676 m)  Weight: 171 lb (77.565 kg)   Body mass index is 27.61 kg/(m^2). GENERAL: The patient is a well-nourished male, in no acute distress. The vital signs are documented above. CARDIOVASCULAR: There is a regular rate and rhythm. PULMONARY: There is good air exchange bilaterally without wheezing or rales. His incision is healing nicely. Has a palpable left posterior tibial pulse.   MEDICAL ISSUES: The patient is doing well status post left common femoral artery endarterectomy with bovine pericardial patch angioplasty. He has quit smoking and is only using vapor cigarettes occasionally. He will continue to be followed by Dr. [name] and I'll see him as needed.  Angelia Mould [facility]: [phone]

## 2013-12-01 ENCOUNTER — Ambulatory Visit (HOSPITAL_COMMUNITY)
Admission: RE | Admit: 2013-12-01 | Discharge: 2013-12-01 | Disposition: A | Discharge status: Home / Self Care | Payer: BC Managed Care – PPO | Source: Ambulatory Visit | Attending: Cardiovascular Disease | Admitting: Cardiovascular Disease

## 2013-12-01 DIAGNOSIS — I739 Peripheral vascular disease, unspecified: Secondary | ICD-10-CM

## 2013-12-01 DIAGNOSIS — I70219 Atherosclerosis of native arteries of extremities with intermittent claudication, unspecified extremity: Secondary | ICD-10-CM

## 2013-12-01 NOTE — Progress Notes (Signed)
Left Lower Ext. Arterial Duplex Completed. Oda Cogan, BS, RDMS, RVT

## 2013-12-09 ENCOUNTER — Encounter: Payer: Self-pay | Admitting: *Deleted

## 2013-12-10 ENCOUNTER — Encounter: Payer: Self-pay | Admitting: *Deleted

## 2013-12-12 ENCOUNTER — Encounter: Payer: Self-pay | Admitting: Cardiovascular Disease

## 2013-12-17 ENCOUNTER — Encounter: Payer: Self-pay | Admitting: Cardiology

## 2013-12-17 ENCOUNTER — Ambulatory Visit (INDEPENDENT_AMBULATORY_CARE_PROVIDER_SITE_OTHER): Payer: BC Managed Care – PPO | Admitting: Cardiology

## 2013-12-17 VITALS — BP 130/78 | HR 73 | Ht 66.0 in | Wt 170.9 lb

## 2013-12-17 DIAGNOSIS — E785 Hyperlipidemia, unspecified: Secondary | ICD-10-CM

## 2013-12-17 DIAGNOSIS — I70229 Atherosclerosis of native arteries of extremities with rest pain, unspecified extremity: Secondary | ICD-10-CM

## 2013-12-17 DIAGNOSIS — I739 Peripheral vascular disease, unspecified: Secondary | ICD-10-CM

## 2013-12-17 DIAGNOSIS — I998 Other disorder of circulatory system: Secondary | ICD-10-CM

## 2013-12-17 DIAGNOSIS — F17200 Nicotine dependence, unspecified, uncomplicated: Secondary | ICD-10-CM

## 2013-12-17 DIAGNOSIS — F17210 Nicotine dependence, cigarettes, uncomplicated: Secondary | ICD-10-CM

## 2013-12-17 DIAGNOSIS — I999 Unspecified disorder of circulatory system: Secondary | ICD-10-CM

## 2013-12-17 DIAGNOSIS — I10 Essential (primary) hypertension: Secondary | ICD-10-CM

## 2013-12-17 NOTE — Patient Instructions (Addendum)
May return to work after Jan 26 2014.-please give paperwork to the front desk.  CONGRATS ON QUITTING SMOKING.  YOU MAY PUT ICE ON AREA THAT IS A LITTLE SORE.  YOU NEED LABS PRIOR TO NEXT APPOINTMENT -LIPIDS AND CMP - YOUR PRIMARY CAN ORDER IT , IF NOT CONTACT OFFICE FOR A LAB SLIP.  Your physician wants you to follow-up in 6 MONTH Dr [name].  You will receive a reminder letter in the mail two months in advance. If you don't receive a letter, please call our office to schedule the follow-up appointment.

## 2013-12-19 ENCOUNTER — Encounter: Payer: Self-pay | Admitting: Cardiology

## 2013-12-19 NOTE — Assessment & Plan Note (Signed)
Well-controlled on Inderal and Cozaar.

## 2013-12-19 NOTE — Progress Notes (Signed)
PATIENT: Jimmy Tucker MRN: [id number] DOB: [birth year] PCP: Leonides Grills, MD  Clinic Note: Chief Complaint  Patient presents with  . Follow-up    post hosp PCI- NO CHEST PAIN , NO SOB,WHEEZING , LEFT LEG EDEMA, SORENESS IN LOWER GROIN, LEFT FOOT NUMB, NO REST AT NIGHT.    HPI: Jimmy Tucker is a 53 y.o. male with a PMH below who presents who presents today for to the procedure were iliac stenting in the common femoral endarterectomy/patch angioplasty. I met him as a for Dr. [name] patient when he was here for lower artery Dopplers that revealed occluded left common iliac and common femoral artery he had significant resting leg pain with worsening claudication and nonhealing ulcer on the left great toe consistent with critical limb ischemia. He was scheduled for peripheral angiography by Dr. [name]. He had seen Dr. [name] in the old [facility] and [place] prior to moving to a the new office. He was noted to have hyperlipidemia, hypertension and PAD with an occluded left anterior tibial artery. He is a long-term smoker who is reluctant to quit. He was intolerant of statins and is basically just stopped them. He still takes blood pressure medications but has not been seen by Dr. [name] since at least 2011. He is now established with Dr. Orson Ape.  Dr. [name] performed angioplasty and stenting on the left common iliac artery, he was then seconded to Dr. Scot Dock, who performed endarterectomy with patch angioplasty. He has had followup Dopplers that show excellent result with widely patent left sided Dopplers. He saw Dr. Scot Dock on June 4 10 was doing quite well. He just had some mild discomfort in the left groin he was otherwise no use and living well without difficulty. His wound had healed dramatically. No more signs of infection.  Interval History: He presents today again doing very well from an overall PAD standpoint. The wound is barely visible now. Has strong pulses bilaterally. He  has quit smoking, having taken his message to heart. Dr. Scot Dock felt that he could discontinue followup with Dr. [name]. There was a concern on the part of the patient that he could potentially have some groin complication. Dr. Scot Dock felt that this could be monitored by our clinic. Somehow yet scheduled to see me as opposed to Dr. [name]. We will have him follow with Dr. [name] in the future.  From a cardiac standpoint, he has not had any symptoms of chest tightness/pressure with rest or exertion. He does get exertional dyspnea and has some occasional wheezing and coughing. He denies any PND, orthopnea. He has had swelling of the left leg/foot but none right. He denies any rapid or irregular heartbeats. No syncope/near-syncope, no TIA or RCA symptoms. No melena, hematochezia or hematuria. He still has mild claudication if he really walks for a left side but it does still have noted anterior tibial occlusion. It compared to the speaking the claudication is minimal. Exactly when this started he is not sure but he thinks he must initially thought it was related to his chronic back pain issues this started with hip pain. Fortunately by the time he realized that it was different the pain was going down into his leg and now is going from his foot up.   Past Medical History  Diagnosis Date  . Chronic back pain   . Hypertension   . Anxiety   . Heavy cigarette smoker (20-39 per day)     Unmotivated  . PAD (peripheral artery disease)  10/30/2012    a) 2011 LEA Dopplers: Left ATA occlusion; b) 10/2013: LEA Dopplers - L Common Iliac & CFA occlusion - w/ short reconstitution at the branch point of the external and internal iliac, the external iliac is occluded through the common femoral and proximal SFA; reconstitutes in the proximal SFA. Two-vessel runoff beyond.; c) s/p LCIA Stent & CFA EA w/ patch angioplasty --> f/u doppler 12/01/2013 patent  . Hyperlipidemia LDL goal <70     Intolerant to statins  . Pollen  allergies   . Chronic lower back pain     "L4-5"  . Lumbar disc disease     Prior Cardiac Evaluation and Past Surgical History: Past Surgical History  Procedure Laterality Date  . Back surgery  02/2008  . Surgery scrotal / testicular Left     cyst excision  . Colonoscopy  07/24/2011    Procedure: COLONOSCOPY;  Surgeon: Daneil Dolin, MD;  Location: [facility];  Service: Endoscopy;  Laterality: N/A;  8:15  . Iliac artery stent Left 11/03/2013    Dr. Judithann Sauger  . Lumbar microdiscectomy Left 02/2008    L4-5  . Endarterectomy femoral Left 11/11/2013    Procedure: Left Femoral Endarterectomy with Patch Angioplasty ;  Surgeon: Angelia Mould, MD;  Location: [facility] OR;  Service: Vascular;  Laterality: Left;  . Nm myocar perf wall motion  07/2009    persantine - normal perfusion    No Known Allergies  Current Outpatient Prescriptions  Medication Sig Dispense Refill  . albuterol (PROAIR HFA) 108 (90 BASE) MCG/ACT inhaler Inhale 2 puffs into the lungs every 4 (four) hours as needed for wheezing or shortness of breath.       . ALPRAZolam (XANAX) 1 MG tablet Take 1 mg by mouth at bedtime as needed for anxiety.       Marland Kitchen aspirin EC 325 MG EC tablet Take 1 tablet (325 mg total) by mouth daily.  30 tablet  0  . folic acid (FOLVITE) 1 MG tablet Take 1 tablet (1 mg total) by mouth daily.  30 tablet  5  . gabapentin (NEURONTIN) 300 MG capsule Take 300 mg by mouth 3 (three) times daily.      Marland Kitchen losartan (COZAAR) 50 MG tablet Take 0.5 tablets (25 mg total) by mouth daily.  30 tablet  5  . Oxycodone HCl 20 MG TABS Take 1 tablet (20 mg total) by mouth every 4 (four) hours as needed (pain).  30 tablet  0  . propranolol (INDERAL) 20 MG tablet Take 20 mg by mouth 2 (two) times daily.       Marland Kitchen thiamine 100 MG tablet Take 1 tablet (100 mg total) by mouth daily.  30 tablet  5   Current Facility-Administered Medications  Medication Dose Route Frequency Provider Last Rate Last Dose  . promethazine (PHENERGAN)  injection 25 mg  25 mg Intravenous Once Daneil Dolin, MD        History   Social History Narrative   Married, still smokes about a pack a day. Unable to work currently because of his claudication.   He does work for [employer] in [place], [place].   family history includes Heart attack (age of onset: 49) in his father; Stroke in his mother; Stroke (age of onset: 62) in his brother; Sudden death in his father. There is no history of Colon cancer.  ROS: A comprehensive Review of Systems - Negative except discomfort in the right groin at the distal edge  of the suture line and right at the crease of his groin. No bruising noted., . Frequent chronic cough but not overly productive. 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  PHYSICAL EXAM BP 130/78  Pulse 73  Ht 5' 6"  (1.676 m)  Wt 170 lb 14.4 oz (77.52 kg)  BMI 27.60 kg/m2 -- he was seen in the peripheral vascular Doppler room. No vitals were checked General appearance: alert, cooperative, appears stated age and Somewhat disheveled. He has distinct odor of cigarettes. Unshaven. He is wearing moccasin slippers so that disease he did get his foot into. HEENT: [place]/AT, EOMI, MMM, anicteric sclera; poor dentition Neck: no adenopathy, no carotid bruit, no JVD, supple, symmetrical, trachea midline and thyroid not enlarged, symmetric, no tenderness/mass/nodules Lungs: Much improved breath sounds bilaterally. Still there are some mild rhonchorous wheezes, but notably improved. Non-labored despite increased AP diameter. Heart: RRR with normal S1 and S2, soft 1/6 SEM and RUSB but otherwise no R./E. Abdomen: soft, non-tender; bowel sounds normal; no masses,  no organomegaly Extremities: No clubbing or cyanosis. Persistent signs of PAD with no hair. Well-healed ulcer on the left great toe. Examination of the groin on the left revealed a well-healed scar with tenderness at the distal suture site. Pulses: There is a bruit on the left  groin, but much more palpable pulses distally. Diminished DP on the left but bounding PT. Skin: Erythematous tight skin changes on the left foot, with bilateral PAD changes with minimal hair Neurologic: Mental status: Alert, oriented, thought content appropriate Cranial nerves: normal  Recent Labs: None since hospitalization.  ASSESSMENT / PLAN: 53 year old gentleman with known mild PAD now presenting in followup from limb salvaging vascularization and a combined percutaneous and surgical manner. There is now brisk flow in the left lower extremity. The ulcer is well healed. He has quit smoking which is tremendous.   He does not have any cardiac symptoms his peak. However he continues to smoke and has clear exam findings to suggest COPD.  PAD (peripheral artery disease) Excellent revascularization, confirmed with Dopplers and by the well-healing ulcer. Will defer the timing of followup Dopplers to Dr. [name] when he sees him in followup. He is on aspirin but not Plavix. Continue risk factor modification. We'll start a statin following lipid profile checked for PCP. Goal LDL would be less than 100..  Critical lower limb ischemia - poorly healing ulcer on left foot with occluded iliac, femoral artery on left Result post-procedure  Essential hypertension Well-controlled on Inderal and Cozaar.  Heavy cigarette smoker (20-39 per day) He made up his mind to quit smoking we'll utilize the severity of his PAD. He has not had a cigarette since his operation, actually the hospitalization. He threw away his cigarettes and all the paraphernalia. I congratulated on his efforts and recommended to his wife join him in this effort.  Hyperlipidemia LDL goal <70 Apparently has been intolerant to statins in the past. We may have to consider using fenofibrate plus or minus Zetia. He simply needs to have risk factor modification because with PAD he has a risk equivalent for CAD.   He will need a work note to  allow him to return to work after August 3. 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 This will also allow him to try to get into some exercise and improve lower extremity perfusion.  No  orders of the defined types were placed in this encounter.   No orders of the defined types were placed in this encounter.    Followup: with Dr. [name] in 6 months   DAVID W. Ellyn Hack, M.D., M.S. Interventional Cardiolgy [facility]

## 2013-12-19 NOTE — Assessment & Plan Note (Addendum)
Excellent revascularization, confirmed with Dopplers and by the well-healing ulcer. Will defer the timing of followup Dopplers to Dr. [name] when he sees him in followup. He is on aspirin but not Plavix. Continue risk factor modification. We'll start a statin following lipid profile checked for PCP. Goal LDL would be less than 100.Marland Kitchen

## 2013-12-19 NOTE — Assessment & Plan Note (Signed)
Result post-procedure

## 2013-12-19 NOTE — Assessment & Plan Note (Signed)
He made up his mind to quit smoking we'll utilize the severity of his PAD. He has not had a cigarette since his operation, actually the hospitalization. He threw away his cigarettes and all the paraphernalia. I congratulated on his efforts and recommended to his wife join him in this effort.

## 2013-12-19 NOTE — Assessment & Plan Note (Signed)
Apparently has been intolerant to statins in the past. We may have to consider using fenofibrate plus or minus Zetia. He simply needs to have risk factor modification because with PAD he has a risk equivalent for CAD.

## 2013-12-25 ENCOUNTER — Telehealth: Payer: Self-pay | Admitting: *Deleted

## 2013-12-25 NOTE — Telephone Encounter (Signed)
LATE ENTRY  ON 12/19/13- DR HARDING SIGNED SHORT-TERM DISABILITY CLAIM FORM PATIENT MAY RETURN BAK TO WORK ON Jan 26 2014. PATIENT WS GIVEN THE ORIGINAL AT TIME OF OFFICE VISIT ON 12/17/13

## 2014-01-20 ENCOUNTER — Telehealth: Payer: Self-pay | Admitting: Cardiology

## 2014-01-20 NOTE — Telephone Encounter (Signed)
Patient needs an updated return to work note that states he has no restrictions.  His return to work date is 01/26/14 and he needs to note by Friday of this week.

## 2014-01-20 NOTE — Telephone Encounter (Signed)
SPOKE TO PATIENT. He needs more current note for his job. Stating no restrictions and can return on 01/26/14, Per patient , the occupational nurse will have see the company doctor.  Will defer to Dr Ellyn Hack. Patient is aware RN will call him back to let him know about letter.

## 2014-01-21 ENCOUNTER — Telehealth: Payer: Self-pay | Admitting: Cardiology

## 2014-01-21 NOTE — Telephone Encounter (Signed)
Returning your call. °

## 2014-01-21 NOTE — Telephone Encounter (Signed)
If he is feeling up to it - he is far enough out post-op to go back to full work 8/3.  I thought we did a note for that.  Leonie Man, MD

## 2014-01-21 NOTE — Telephone Encounter (Signed)
Spoke to patient Informed patient the letter is ready to pick up  patient asked if letter can be faxed to his daughter 's fax Olin Hauser [phone] RN faxed letter

## 2014-01-21 NOTE — Telephone Encounter (Signed)
Spoke with pt, aware we have re-faxed to the number he provided.

## 2014-01-21 NOTE — Telephone Encounter (Signed)
Pt said he did not received the fax. Do you want him to pick it up.

## 2014-02-04 NOTE — Addendum Note (Signed)
Addended by: Vear Clock on: 02/04/2014 01:27 PM   Modules accepted: Orders

## 2014-02-22 ENCOUNTER — Encounter (HOSPITAL_COMMUNITY): Payer: Self-pay | Admitting: Emergency Medicine

## 2014-02-22 ENCOUNTER — Emergency Department (HOSPITAL_COMMUNITY)
Admission: EM | Admit: 2014-02-22 | Discharge: 2014-02-22 | Disposition: A | Discharge status: Home / Self Care | Payer: BC Managed Care – PPO | Attending: Emergency Medicine | Admitting: Emergency Medicine

## 2014-02-22 DIAGNOSIS — Z87891 Personal history of nicotine dependence: Secondary | ICD-10-CM | POA: Diagnosis not present

## 2014-02-22 DIAGNOSIS — I10 Essential (primary) hypertension: Secondary | ICD-10-CM | POA: Insufficient documentation

## 2014-02-22 DIAGNOSIS — L0231 Cutaneous abscess of buttock: Secondary | ICD-10-CM | POA: Insufficient documentation

## 2014-02-22 DIAGNOSIS — F411 Generalized anxiety disorder: Secondary | ICD-10-CM | POA: Insufficient documentation

## 2014-02-22 DIAGNOSIS — Z7982 Long term (current) use of aspirin: Secondary | ICD-10-CM | POA: Insufficient documentation

## 2014-02-22 DIAGNOSIS — G8929 Other chronic pain: Secondary | ICD-10-CM | POA: Insufficient documentation

## 2014-02-22 DIAGNOSIS — Z79899 Other long term (current) drug therapy: Secondary | ICD-10-CM | POA: Diagnosis not present

## 2014-02-22 DIAGNOSIS — L03317 Cellulitis of buttock: Principal | ICD-10-CM

## 2014-02-22 HISTORY — DX: Presence of other vascular implants and grafts: Z95.828

## 2014-02-22 MED ORDER — DOXYCYCLINE HYCLATE 100 MG PO TABS
100.0000 mg | ORAL_TABLET | Freq: Once | ORAL | Status: AC
  Administered 2014-02-22: 100 mg via ORAL
  Filled 2014-02-22: qty 1

## 2014-02-22 MED ORDER — SULFAMETHOXAZOLE-TRIMETHOPRIM 800-160 MG PO TABS: 1.0000 | ORAL_TABLET | Freq: Two times a day (BID) | ORAL | Status: AC

## 2014-02-22 NOTE — Discharge Instructions (Signed)
Call Dr. Romona Curls tomorrow for follow up. Return here as needed.   Abscess An abscess is an infected area that contains a collection of pus and debris.It can occur in almost any part of the body. An abscess is also known as a furuncle or boil. CAUSES  An abscess occurs when tissue gets infected. This can occur from blockage of oil or sweat glands, infection of hair follicles, or a minor injury to the skin. As the body tries to fight the infection, pus collects in the area and creates pressure under the skin. This pressure causes pain. People with weakened immune systems have difficulty fighting infections and get certain abscesses more often.  SYMPTOMS Usually an abscess develops on the skin and becomes a painful mass that is red, warm, and tender. If the abscess forms under the skin, you may feel a moveable soft area under the skin. Some abscesses break open (rupture) on their own, but most will continue to get worse without care. The infection can spread deeper into the body and eventually into the bloodstream, causing you to feel ill.  DIAGNOSIS  Your caregiver will take your medical history and perform a physical exam. A sample of fluid may also be taken from the abscess to determine what is causing your infection. TREATMENT  Your caregiver may prescribe antibiotic medicines to fight the infection. However, taking antibiotics alone usually does not cure an abscess. Your caregiver may need to make a small cut (incision) in the abscess to drain the pus. In some cases, gauze is packed into the abscess to reduce pain and to continue draining the area. HOME CARE INSTRUCTIONS   Only take over-the-counter or prescription medicines for pain, discomfort, or fever as directed by your caregiver.  If you were prescribed antibiotics, take them as directed. Finish them even if you start to feel better.  If gauze is used, follow your caregiver's directions for changing the gauze.  To avoid spreading the  infection:  Keep your draining abscess covered with a bandage.  Wash your hands well.  Do not share personal care items, towels, or whirlpools with others.  Avoid skin contact with others.  Keep your skin and clothes clean around the abscess.  Keep all follow-up appointments as directed by your caregiver. SEEK MEDICAL CARE IF:   You have increased pain, swelling, redness, fluid drainage, or bleeding.  You have muscle aches, chills, or a general ill feeling.  You have a fever. MAKE SURE YOU:   Understand these instructions.  Will watch your condition.  Will get help right away if you are not doing well or get worse. Document Released: 03/22/2005 Document Revised: 12/12/2011 Document Reviewed: 08/25/2011 [facility] Patient Information 2015 [place], [place]. This information is not intended to replace advice given to you by your health care provider. Make sure you discuss any questions you have with your health care provider.

## 2014-02-22 NOTE — ED Notes (Addendum)
Pt c/o of a painful bump that appeared on his LT buttock. Pt denies any redness or streaking, but did notice sero-sanguinous drainage coming from it today.

## 2014-02-22 NOTE — ED Provider Notes (Signed)
CSN: [id number]     Arrival date & time 02/22/14  1510 History   First MD Initiated Contact with Patient 02/22/14 1608     Chief Complaint  Patient presents with  . Abscess     (Consider location/radiation/quality/duration/timing/severity/associated sxs/prior Treatment) Patient is a 53 y.o. male presenting with abscess. The history is provided by the patient.  Abscess Location:  Ano-genital Ano-genital abscess location:  L buttock Abscess quality: painful, redness, warmth and weeping   Red streaking: no   Duration:  4 days Progression:  Worsening Pain details:    Quality:  Throbbing and sharp   Timing:  Constant   Progression:  Worsening Chronicity:  New Relieved by:  Warm compresses Worsened by:  Cold compresses Risk factors: prior abscess    Jimmy Tucker is a 53 y.o. male who presents to the ED with an abscess to the left buttock. The area started 4 days ago. 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 He has had abscesses in the past. He has been standing in the warm shower and that helps. He states that this morning when he took his pants off the area started draining some.  Past Medical History  Diagnosis Date  . Chronic back pain   . Hypertension   . Anxiety   . Heavy cigarette smoker (20-39 per day)     Unmotivated  . PAD (peripheral artery disease) 10/30/2012    a) 2011 LEA Dopplers: Left ATA occlusion; b) 10/2013: LEA Dopplers - L Common Iliac & CFA occlusion - w/ short reconstitution at the branch point of the external and internal iliac, the external iliac is occluded through the common femoral and proximal SFA; reconstitutes in the proximal SFA. Two-vessel runoff beyond.; c) s/p LCIA Stent & CFA EA w/ patch angioplasty --> f/u doppler 12/01/2013 patent  . Hyperlipidemia LDL goal <70     Intolerant to statins  . Pollen allergies   . Chronic lower back pain     "L4-5"  . Lumbar disc disease   . Presence of stent in  artery    Past Surgical History  Procedure Laterality Date  . Back surgery  02/2008  . Surgery scrotal / testicular Left     cyst excision  . Colonoscopy  07/24/2011    Procedure: COLONOSCOPY;  Surgeon: Daneil Dolin, MD;  Location: [facility];  Service: Endoscopy;  Laterality: N/A;  8:15  . Iliac artery stent Left 11/03/2013    Dr. Judithann Sauger  . Lumbar microdiscectomy Left 02/2008    L4-5  . Endarterectomy femoral Left 11/11/2013    Procedure: Left Femoral Endarterectomy with Patch Angioplasty ;  Surgeon: Angelia Mould, MD;  Location: [facility] OR;  Service: Vascular;  Laterality: Left;  . Nm myocar perf wall motion  07/2009    persantine - normal perfusion  . Coronary stent placement     Family History  Problem Relation Age of Onset  . Colon cancer Neg Hx   . Heart attack Father 6    Cardiac arrest  . Sudden death Father   . Stroke Brother 64  . Stroke Mother    History  Substance Use Topics  . Smoking status: Former Smoker -- 1.00 packs/day for 40 years    Types: Cigarettes    Quit date: 11/02/2013  . Smokeless tobacco: Never Used  . Alcohol Use: 12.6 oz/week    21 Cans of beer per week  Comment: every day     Review of Systems Abscess left buttock All other systems negative   Allergies  Review of patient's allergies indicates no known allergies.  Home Medications   Prior to Admission medications   Medication Sig Start Date End Date Taking? Authorizing Provider  albuterol (PROAIR HFA) 108 (90 BASE) MCG/ACT inhaler Inhale 2 puffs into the lungs every 4 (four) hours as needed for wheezing or shortness of breath.     Historical Provider, MD  ALPRAZolam Duanne Moron) 1 MG tablet Take 1 mg by mouth at bedtime as needed for anxiety.  05/22/11   Historical Provider, MD  aspirin EC 325 MG EC tablet Take 1 tablet (325 mg total) by mouth daily. 11/04/13   Tarri Fuller, PA-C  folic acid (FOLVITE) 1 MG tablet Take 1 tablet (1 mg total) by mouth daily. 11/04/13   Tarri Fuller, PA-C    gabapentin (NEURONTIN) 300 MG capsule Take 300 mg by mouth 3 (three) times daily.    Historical Provider, MD  losartan (COZAAR) 50 MG tablet Take 0.5 tablets (25 mg total) by mouth daily. 11/04/13   Tarri Fuller, PA-C  Oxycodone HCl 20 MG TABS Take 1 tablet (20 mg total) by mouth every 4 (four) hours as needed (pain). 11/11/13   Samantha J Rhyne, PA-C  propranolol (INDERAL) 20 MG tablet Take 20 mg by mouth 2 (two) times daily.     Historical Provider, MD  thiamine 100 MG tablet Take 1 tablet (100 mg total) by mouth daily. 11/04/13   Tarri Fuller, PA-C   BP 129/83  Pulse 109  Temp(Src) 99.3 F (37.4 C) (Oral)  Resp 18  Ht 5\' 6"  (1.676 m)  Wt 173 lb (78.472 kg)  BMI 27.94 kg/m2  SpO2 98% Physical Exam  Nursing note and vitals reviewed. Constitutional: He is oriented to person, place, and time. He appears well-developed and well-nourished. No distress.  HENT:  Head: Normocephalic.  Eyes: EOM are normal.  Neck: Neck supple.  Cardiovascular: Tachycardia present.   Pulmonary/Chest: Effort normal.  Musculoskeletal: Normal range of motion.       Back:  Abscess to left buttocks with erythema surrounding.  Neurological: He is alert and oriented to person, place, and time. No cranial nerve deficit.  Skin: Skin is warm and dry.  Psychiatric: He has a normal mood and affect. His behavior is normal.    ED Course  Procedures (including critical care time)  I discussed with the patient I&D, however, he request that he follow up with Dr. Romona Curls to do the procedure because he has done all the others in the past. He request culture of the abscess and antibiotics until he can see Dr. Romona Curls.  MDM  53 y.o. male with abscess to the left buttock. Drainage from abscess sent for culture and will start antibiotics. Patient to call Dr. Romona Curls tomorrow to schedule follow up. He will continue to apply warm wet compresses to the area he help it continue to drain. He will return here as needed. Stable for  discharge without fever or signs of sepsis, no red streaking noted.    Medication List    TAKE these medications       sulfamethoxazole-trimethoprim 800-160 MG per tablet  Commonly known as:  BACTRIM DS,SEPTRA DS  Take 1 tablet by mouth 2 (two) times daily.      ASK your doctor about these medications       ALPRAZolam 1 MG tablet  Commonly known as:  XANAX  Take 1  mg by mouth at bedtime as needed for anxiety.     aspirin 325 MG EC tablet  Take 1 tablet (325 mg total) by mouth daily.     folic acid 1 MG tablet  Commonly known as:  FOLVITE  Take 1 tablet (1 mg total) by mouth daily.     gabapentin 300 MG capsule  Commonly known as:  NEURONTIN  Take 300 mg by mouth 3 (three) times daily.     losartan 50 MG tablet  Commonly known as:  COZAAR  Take 0.5 tablets (25 mg total) by mouth daily.     Oxycodone HCl 20 MG Tabs  Take 1 tablet (20 mg total) by mouth every 4 (four) hours as needed (pain).     PROAIR HFA 108 (90 BASE) MCG/ACT inhaler  Generic drug:  albuterol  Inhale 2 puffs into the lungs every 4 (four) hours as needed for wheezing or shortness of breath.     propranolol 20 MG tablet  Commonly known as:  INDERAL  Take 20 mg by mouth 2 (two) times daily.     thiamine 100 MG tablet  Take 1 tablet (100 mg total) by mouth daily.           [facility] Bunnie Pion, [place] 02/22/14 [id number]

## 2014-02-22 NOTE — ED Provider Notes (Signed)
Medical screening examination/treatment/procedure(s) were performed by non-physician practitioner and as supervising physician I was immediately available for consultation/collaboration.   EKG Interpretation None        Sharyon Cable, MD 02/22/14 [phone]

## 2014-02-26 LAB — CULTURE, ROUTINE-ABSCESS: Gram Stain: NONE SEEN

## 2014-05-01 ENCOUNTER — Other Ambulatory Visit: Payer: Self-pay | Admitting: *Deleted

## 2014-05-01 MED ORDER — FOLIC ACID 1 MG PO TABS: 1.0000 mg | ORAL_TABLET | Freq: Every day | ORAL | Status: DC

## 2014-05-01 NOTE — Telephone Encounter (Signed)
Rx refill sent to patient pharmacy   

## 2014-05-07 ENCOUNTER — Telehealth: Payer: Self-pay | Admitting: Cardiovascular Disease

## 2014-05-07 DIAGNOSIS — E785 Hyperlipidemia, unspecified: Secondary | ICD-10-CM

## 2014-05-07 DIAGNOSIS — Z79899 Other long term (current) drug therapy: Secondary | ICD-10-CM

## 2014-05-07 NOTE — Telephone Encounter (Signed)
Needs labs ordered (lipid and CMP per OV in June with Dr. Ellyn Hack) prior to OV with Dr. [name] in December. Ordered to be done at [facility] and he is aware of location. He voiced understanding. He knows to make appointment with Dr. [name]

## 2014-05-07 NOTE — Telephone Encounter (Signed)
Pt would like his lab order faxed to [facility] forgot what lab work he wanted. Pt is going to see the doctor on 05-18-14.

## 2014-05-18 LAB — COMPREHENSIVE METABOLIC PANEL
ALK PHOS: 77 U/L (ref 39–117)
ALT: 21 U/L (ref 0–53)
AST: 27 U/L (ref 0–37)
Albumin: 3.7 g/dL (ref 3.5–5.2)
BILIRUBIN TOTAL: 0.2 mg/dL (ref 0.2–1.2)
BUN: 11 mg/dL (ref 6–23)
CO2: 29 mEq/L (ref 19–32)
Calcium: 9.3 mg/dL (ref 8.4–10.5)
Chloride: 101 mEq/L (ref 96–112)
Creat: 0.99 mg/dL (ref 0.50–1.35)
Glucose, Bld: 102 mg/dL — ABNORMAL HIGH (ref 70–99)
Potassium: 4.7 mEq/L (ref 3.5–5.3)
Sodium: 137 mEq/L (ref 135–145)
Total Protein: 6.5 g/dL (ref 6.0–8.3)

## 2014-05-18 LAB — LIPID PANEL
CHOL/HDL RATIO: 3.3 ratio
CHOLESTEROL: 178 mg/dL (ref 0–200)
HDL: 54 mg/dL (ref >39)
LDL Cholesterol: 62 mg/dL (ref 0–99)
Triglycerides: 309 mg/dL — ABNORMAL HIGH (ref <150)
VLDL: 62 mg/dL — ABNORMAL HIGH (ref 0–40)

## 2014-05-26 ENCOUNTER — Telehealth (HOSPITAL_COMMUNITY): Payer: Self-pay | Admitting: *Deleted

## 2014-06-04 ENCOUNTER — Encounter (HOSPITAL_COMMUNITY): Payer: Self-pay | Admitting: Cardiovascular Disease

## 2014-06-08 ENCOUNTER — Other Ambulatory Visit (HOSPITAL_COMMUNITY): Payer: Self-pay | Admitting: Cardiovascular Disease

## 2014-06-08 DIAGNOSIS — I739 Peripheral vascular disease, unspecified: Secondary | ICD-10-CM

## 2014-07-28 ENCOUNTER — Ambulatory Visit (INDEPENDENT_AMBULATORY_CARE_PROVIDER_SITE_OTHER): Payer: BLUE CROSS/BLUE SHIELD | Admitting: Cardiovascular Disease

## 2014-07-28 ENCOUNTER — Encounter: Payer: Self-pay | Admitting: Cardiovascular Disease

## 2014-07-28 ENCOUNTER — Ambulatory Visit (HOSPITAL_COMMUNITY)
Admission: RE | Admit: 2014-07-28 | Discharge: 2014-07-28 | Disposition: A | Discharge status: Home / Self Care | Payer: BLUE CROSS/BLUE SHIELD | Source: Ambulatory Visit | Attending: Cardiovascular Disease | Admitting: Cardiovascular Disease

## 2014-07-28 VITALS — BP 130/88 | HR 69 | Ht 66.0 in | Wt 164.0 lb

## 2014-07-28 DIAGNOSIS — I998 Other disorder of circulatory system: Secondary | ICD-10-CM

## 2014-07-28 DIAGNOSIS — I739 Peripheral vascular disease, unspecified: Secondary | ICD-10-CM | POA: Diagnosis not present

## 2014-07-28 DIAGNOSIS — E785 Hyperlipidemia, unspecified: Secondary | ICD-10-CM

## 2014-07-28 DIAGNOSIS — I70229 Atherosclerosis of native arteries of extremities with rest pain, unspecified extremity: Secondary | ICD-10-CM

## 2014-07-28 DIAGNOSIS — I10 Essential (primary) hypertension: Secondary | ICD-10-CM

## 2014-07-28 DIAGNOSIS — F17210 Nicotine dependence, cigarettes, uncomplicated: Secondary | ICD-10-CM

## 2014-07-28 NOTE — Assessment & Plan Note (Signed)
History of hypertension with blood pressure measured today at 130/88. He is on Inderal and losartan. Continue current meds at current dosing

## 2014-07-28 NOTE — Assessment & Plan Note (Signed)
History of hyperlipidemia not on a statin drug drug. His most recent lipid profile performed 05/18/14 revealed a total cholesterol 178, LDL of 62 HDL of 54

## 2014-07-28 NOTE — Progress Notes (Signed)
07/28/2014 Exavior T Madag[place]ar   [birth year]  [id number]  Primary Physician Glo Herring., MD Primary Cardiologist: Lorretta Harp MD Jimmy Tucker   HPI:  Mr. Madag[place]ar is a 54 year old Married Caucasian male who I saw several years ago. He currently is a patient of Dr. Gerarda Fraction in [place]. He has a history of tobacco abuse (recently discontinued), hypertension and hyperlipidemia. Because of critical limb ischemia he had arterial Dopplers performed in our office on 5 10/30/2013 revealing an occluded common iliac, occluded SFA and proximal SFA.I performed angiography on him 11/03/13 and stented a subtotally occluded left common iliac artery. He did have an occluded left common femoral and diffuse SFA disease. Dr. Scot Dock performed endarterectomy with bovine patch angioplasty of his occluded left common femoral artery on 11/11/13. Doppler's office today revealed a normal left ABI. His ulcers have healed. He has since stopped smoking. He denies chest pain or shortness of breath.  Current Outpatient Prescriptions  Medication Sig Dispense Refill  . albuterol (PROAIR HFA) 108 (90 BASE) MCG/ACT inhaler Inhale 2 puffs into the lungs every 4 (four) hours as needed for wheezing or shortness of breath.     . ALPRAZolam (XANAX) 1 MG tablet Take 1 mg by mouth at bedtime as needed for anxiety.     Marland Kitchen aspirin EC 325 MG EC tablet Take 1 tablet (325 mg total) by mouth daily. 30 tablet 0  . folic acid (FOLVITE) 1 MG tablet Take 1 tablet (1 mg total) by mouth daily. 30 tablet 5  . gabapentin (NEURONTIN) 300 MG capsule Take 300 mg by mouth 3 (three) times daily.    Marland Kitchen losartan (COZAAR) 50 MG tablet Take 0.5 tablets (25 mg total) by mouth daily. 30 tablet 5  . Oxycodone HCl 20 MG TABS Take 1 tablet (20 mg total) by mouth every 4 (four) hours as needed (pain). 30 tablet 0  . propranolol (INDERAL) 20 MG tablet Take 20 mg by mouth 2 (two) times daily.      Current Facility-Administered Medications  Medication  Dose Route Frequency Provider Last Rate Last Dose  . promethazine (PHENERGAN) injection 25 mg  25 mg Intravenous Once Daneil Dolin, MD        No Known Allergies  History   Social History  . Marital Status: Married    Spouse Name: N/A    Number of Children: N/A  . Years of Education: N/A   Occupational History  . [employer]      in [place]   Social History Main Topics  . Smoking status: Former Smoker -- 1.00 packs/day for 40 years    Types: Cigarettes    Quit date: 11/02/2013  . Smokeless tobacco: Never Used  . Alcohol Use: 12.6 oz/week    21 Cans of beer per week     Comment: every day   . Drug Use: No  . Sexual Activity: Not Currently   Other Topics Concern  . Not on file   Social History Narrative   Married, still smokes about a pack a day. Unable to work currently because of his claudication.   He does work for [employer] in [place], [place].     Review of Systems: General: negative for chills, fever, night sweats or weight changes.  Cardiovascular: negative for chest pain, dyspnea on exertion, edema, orthopnea, palpitations, paroxysmal nocturnal dyspnea or shortness of breath Dermatological: negative for rash Respiratory: negative for cough or wheezing Urologic: negative for hematuria Abdominal: negative for nausea, vomiting, diarrhea, bright  red blood per rectum, melena, or hematemesis Neurologic: negative for visual changes, syncope, or dizziness All other systems reviewed and are otherwise negative except as noted above.    Blood pressure 130/88, pulse 69, height 5\' 6"  (1.676 m), weight 164 lb (74.39 kg).  General appearance: alert and no distress Neck: no adenopathy, no carotid bruit, no JVD, supple, symmetrical, trachea midline and thyroid not enlarged, symmetric, no tenderness/mass/nodules Lungs: clear to auscultation bilaterally Heart: regular rate and rhythm, S1, S2 normal, no murmur, click, rub or gallop Extremities:  extremities normal, atraumatic, no cyanosis or edema  EKG normal sinus rhythm at 69 without ST or T-wave changes. I personally reviewed this EKG  ASSESSMENT AND PLAN:   Critical lower limb ischemia - poorly healing ulcer on left foot with occluded iliac, femoral artery on left I performed angiography on Mr. Madag[place]ar 11/03/13 and stented a highly calcified left common iliac artery stenosis. He had an occlusion of his left common femoral artery and moderate SFA disease with two-vessel runoff. He had an occluded anterior tibial. He also had a 60% right common iliac artery stenosis and a 75% below the knee popliteal artery stenosis. I sent him to Dr. Gae Gallop who performed patch angioplasty and endarterectomy of the occluded left common femoral artery 11/11/13. His Dopplers performed today revealed a normal left ABI and widely patent iliac and common femoral artery. His right ABI is 1 with moderate iliac disease. He still has some discomfort with ambulation on the left side although his ulcers have healed.   Heavy cigarette smoker (20-39 per day) The patient has quit smoking   Hyperlipidemia LDL goal <70 History of hyperlipidemia not on a statin drug drug. His most recent lipid profile performed 05/18/14 revealed a total cholesterol 178, LDL of 62 HDL of 54   Essential hypertension History of hypertension with blood pressure measured today at 130/88. He is on Inderal and losartan. Continue current meds at current dosing       Lorretta Harp MD [facility], [facility] 07/28/2014 4:00 PM

## 2014-07-28 NOTE — Assessment & Plan Note (Signed)
I performed angiography on Jimmy Tucker 11/03/13 and stented a highly calcified left common iliac artery stenosis. He had an occlusion of his left common femoral artery and moderate SFA disease with two-vessel runoff. He had an occluded anterior tibial. He also had a 60% right common iliac artery stenosis and a 75% below the knee popliteal artery stenosis. I sent him to Dr. Gae Gallop who performed patch angioplasty and endarterectomy of the occluded left common femoral artery 11/11/13. His Dopplers performed today revealed a normal left ABI and widely patent iliac and common femoral artery. His right ABI is 1 with moderate iliac disease. He still has some discomfort with ambulation on the left side although his ulcers have healed.

## 2014-07-28 NOTE — Patient Instructions (Signed)
Lower extremity arterial doppler (in 6 months)- During this test, ultrasound is used to evaluate arterial blood flow in the legs. Allow approximately one hour for this exam.   Your physician wants you to follow-up in 6 months with Dr. [name]. You will receive a reminder letter in the mail 2 months in advance. If you do not receive a letter, please call our office to schedule the follow-up appointment.

## 2014-07-28 NOTE — Assessment & Plan Note (Signed)
The patient has quit smoking

## 2014-07-28 NOTE — Progress Notes (Signed)
Lower Extremity Arterial Duplex Completed. °Brianna L Mazza,RVT °

## 2014-09-14 ENCOUNTER — Other Ambulatory Visit (HOSPITAL_COMMUNITY): Payer: Self-pay | Admitting: Internal Medicine

## 2014-09-14 DIAGNOSIS — R1310 Dysphagia, unspecified: Secondary | ICD-10-CM

## 2014-09-17 ENCOUNTER — Other Ambulatory Visit (HOSPITAL_COMMUNITY): Payer: BLUE CROSS/BLUE SHIELD

## 2014-09-21 ENCOUNTER — Ambulatory Visit (HOSPITAL_COMMUNITY)
Admission: RE | Admit: 2014-09-21 | Discharge: 2014-09-21 | Disposition: A | Discharge status: Home / Self Care | Payer: BLUE CROSS/BLUE SHIELD | Source: Ambulatory Visit | Attending: Internal Medicine | Admitting: Internal Medicine

## 2014-09-21 DIAGNOSIS — K222 Esophageal obstruction: Secondary | ICD-10-CM | POA: Diagnosis not present

## 2014-09-21 DIAGNOSIS — R1310 Dysphagia, unspecified: Secondary | ICD-10-CM | POA: Diagnosis present

## 2014-10-06 ENCOUNTER — Other Ambulatory Visit (INDEPENDENT_AMBULATORY_CARE_PROVIDER_SITE_OTHER): Payer: Self-pay | Admitting: *Deleted

## 2014-10-06 ENCOUNTER — Telehealth (INDEPENDENT_AMBULATORY_CARE_PROVIDER_SITE_OTHER): Payer: Self-pay | Admitting: *Deleted

## 2014-10-06 DIAGNOSIS — K222 Esophageal obstruction: Secondary | ICD-10-CM

## 2014-10-06 NOTE — Telephone Encounter (Signed)
Referring MD/PCP: fusco   Procedure: egd/ed  Reason/Indication:  Esophageal tristure  Has patient had this procedure before?  no  If so, when, by whom and where?    Is there a family history of colon cancer?    Who?  What age when diagnosed?    Is patient diabetic?   no      Does patient have prosthetic heart valve?  no  Do you have a pacemaker?  no  Has patient ever had endocarditis? no  Has patient had joint replacement within last 12 months?  no  Does patient tend to be constipated or take laxatives? no  Is patient on Coumadin, Plavix and/or Aspirin? yes  Medications: see epic  Allergies: see epic  Medication Adjustment: asa 2 days  Procedure date & time: 10/09/14 at 230

## 2014-10-06 NOTE — Telephone Encounter (Signed)
agree

## 2014-10-09 ENCOUNTER — Encounter (HOSPITAL_COMMUNITY)
Admission: RE | Disposition: A | Discharge status: Home / Self Care | Payer: Self-pay | Source: Ambulatory Visit | Attending: Internal Medicine

## 2014-10-09 ENCOUNTER — Ambulatory Visit (HOSPITAL_COMMUNITY)
Admission: RE | Admit: 2014-10-09 | Discharge: 2014-10-09 | Disposition: A | Discharge status: Home / Self Care | Payer: BLUE CROSS/BLUE SHIELD | Source: Ambulatory Visit | Attending: Internal Medicine | Admitting: Internal Medicine

## 2014-10-09 ENCOUNTER — Encounter (HOSPITAL_COMMUNITY): Payer: Self-pay

## 2014-10-09 DIAGNOSIS — I739 Peripheral vascular disease, unspecified: Secondary | ICD-10-CM | POA: Insufficient documentation

## 2014-10-09 DIAGNOSIS — I2510 Atherosclerotic heart disease of native coronary artery without angina pectoris: Secondary | ICD-10-CM | POA: Insufficient documentation

## 2014-10-09 DIAGNOSIS — K2960 Other gastritis without bleeding: Secondary | ICD-10-CM | POA: Diagnosis not present

## 2014-10-09 DIAGNOSIS — K22711 Barrett's esophagus with high grade dysplasia: Secondary | ICD-10-CM | POA: Diagnosis not present

## 2014-10-09 DIAGNOSIS — R1310 Dysphagia, unspecified: Secondary | ICD-10-CM | POA: Insufficient documentation

## 2014-10-09 DIAGNOSIS — Z79899 Other long term (current) drug therapy: Secondary | ICD-10-CM | POA: Insufficient documentation

## 2014-10-09 DIAGNOSIS — Z7982 Long term (current) use of aspirin: Secondary | ICD-10-CM | POA: Insufficient documentation

## 2014-10-09 DIAGNOSIS — F419 Anxiety disorder, unspecified: Secondary | ICD-10-CM | POA: Insufficient documentation

## 2014-10-09 DIAGNOSIS — K2900 Acute gastritis without bleeding: Secondary | ICD-10-CM

## 2014-10-09 DIAGNOSIS — I10 Essential (primary) hypertension: Secondary | ICD-10-CM | POA: Diagnosis not present

## 2014-10-09 DIAGNOSIS — Z87891 Personal history of nicotine dependence: Secondary | ICD-10-CM | POA: Insufficient documentation

## 2014-10-09 DIAGNOSIS — E785 Hyperlipidemia, unspecified: Secondary | ICD-10-CM | POA: Insufficient documentation

## 2014-10-09 DIAGNOSIS — K2980 Duodenitis without bleeding: Secondary | ICD-10-CM | POA: Insufficient documentation

## 2014-10-09 DIAGNOSIS — G8929 Other chronic pain: Secondary | ICD-10-CM | POA: Diagnosis not present

## 2014-10-09 DIAGNOSIS — C159 Malignant neoplasm of esophagus, unspecified: Secondary | ICD-10-CM | POA: Insufficient documentation

## 2014-10-09 DIAGNOSIS — K449 Diaphragmatic hernia without obstruction or gangrene: Secondary | ICD-10-CM | POA: Insufficient documentation

## 2014-10-09 DIAGNOSIS — K222 Esophageal obstruction: Secondary | ICD-10-CM | POA: Diagnosis not present

## 2014-10-09 DIAGNOSIS — Z888 Allergy status to other drugs, medicaments and biological substances status: Secondary | ICD-10-CM | POA: Diagnosis not present

## 2014-10-09 HISTORY — PX: ESOPHAGOGASTRODUODENOSCOPY: SHX5428

## 2014-10-09 SURGERY — EGD (ESOPHAGOGASTRODUODENOSCOPY)
Anesthesia: Moderate Sedation

## 2014-10-09 MED ORDER — MIDAZOLAM HCL 5 MG/5ML IJ SOLN
INTRAMUSCULAR | Status: AC
  Filled 2014-10-09: qty 5

## 2014-10-09 MED ORDER — PANTOPRAZOLE SODIUM 40 MG PO TBEC: 40.0000 mg | DELAYED_RELEASE_TABLET | Freq: Two times a day (BID) | ORAL | Status: DC

## 2014-10-09 MED ORDER — MEPERIDINE HCL 50 MG/ML IJ SOLN
INTRAMUSCULAR | Status: AC
  Filled 2014-10-09: qty 1

## 2014-10-09 MED ORDER — MIDAZOLAM HCL 5 MG/5ML IJ SOLN
INTRAMUSCULAR | Status: AC
  Filled 2014-10-09: qty 10

## 2014-10-09 MED ORDER — MIDAZOLAM HCL 5 MG/5ML IJ SOLN
INTRAMUSCULAR | Status: DC | PRN
  Administered 2014-10-09 (×6): 3 mg via INTRAVENOUS

## 2014-10-09 MED ORDER — PROMETHAZINE HCL 25 MG/ML IJ SOLN
INTRAMUSCULAR | Status: DC | PRN
  Administered 2014-10-09: 25 mg via INTRAVENOUS

## 2014-10-09 MED ORDER — STERILE WATER FOR IRRIGATION IR SOLN
Status: DC | PRN
  Administered 2014-10-09: 10:00:00

## 2014-10-09 MED ORDER — BUTAMBEN-TETRACAINE-BENZOCAINE 2-2-14 % EX AERO
INHALATION_SPRAY | CUTANEOUS | Status: DC | PRN
  Administered 2014-10-09: 2 via TOPICAL

## 2014-10-09 MED ORDER — MEPERIDINE HCL 50 MG/ML IJ SOLN
INTRAMUSCULAR | Status: DC | PRN
  Administered 2014-10-09 (×4): 25 mg via INTRAVENOUS

## 2014-10-09 MED ORDER — PROMETHAZINE HCL 25 MG/ML IJ SOLN
INTRAMUSCULAR | Status: AC
  Filled 2014-10-09: qty 1

## 2014-10-09 MED ORDER — SODIUM CHLORIDE 0.9 % IV SOLN
INTRAVENOUS | Status: DC
  Administered 2014-10-09: 10:00:00 via INTRAVENOUS

## 2014-10-09 NOTE — Op Note (Signed)
EGD PROCEDURE REPORT  PATIENT:  Jimmy Tucker  MR#:  [id number] Birthdate:  [birth year], 54 y.o., male Endoscopist:  Dr. Rogene Houston, MD Referred By:  Dr. Glo Herring, MD Procedure Date: 10/09/2014  Procedure:   EGD  Indications:  Patient is 54 year old Caucasian male with multiple medical problems who presents with 6 week history of dysphagia to solids. He states he had a period of 6 months when he had intractable heartburn 8 years ago but none since then. He had barium study recently which reveals stricture in the distal esophagus 5 cm proximal to GE junction.            Informed Consent:  The risks, benefits, alternatives & imponderables which include, but are not limited to, bleeding, infection, perforation, drug reaction and potential missed lesion have been reviewed.  The potential for biopsy, lesion removal, esophageal dilation, etc. have also been discussed.  Questions have been answered.  All parties agreeable.  Please see history & physical in medical record for more information.  Medications:  Demerol 100 mg IV Versed 18 mg IV Promethazine 25 mg IV and diluted form Cetacaine spray topically for oropharyngeal anesthesia  Description of procedure:  The endoscope was introduced through the mouth and advanced to the second portion of the duodenum without difficulty or limitations. The mucosal surfaces were surveyed very carefully during advancement of the scope and upon withdrawal. Examination begun with Q-scope was completed with [facility] scope.  Findings:  Esophagus: Mucosa of the proximal segment was normal. Large ulcer with stricture noted at 31 cm from the incisors not allowing passage of Q-scope. I was able to advance Slim scope across the stricture and dilating along the way. Salmon colored mucosa noted down to GE junction. GEJ:  35 cm Hiatus:  38 cm Stomach: Stomach was empty and distended very well with insufflation. Folds in the proximal stomach were normal.  Examination mucosa at gastric body was normal. Antral mucosa revealed patchy erythema and few erosions. Pyloric channel was patent. Tenderness fundus and cardia were unremarkable.  Duodenum:  Single distal bulbar erosion noted.  Therapeutic/Diagnostic Maneuvers Performed:   Esophagus was dilated with scope. Biopsies taken from salmon colored mucosa to confirm diagnosis of Barrett's esophagus along with biopsies from ulcer margin.  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 Stricture was only dilated only with scope. 4 cm long Barrett's segment. Biopsy taken for confirmation. Small sliding hiatal hernia. Erosive gastritis and duodenitis.  Comment; Endoscopic findings are very concerning for neoplasm but could be due to benign process.  Recommendations:  Anti-reflux measures. Pantoprazole 40 mg by mouth twice a day. H. pylori serology. Soft diet. She also advised to keep alcohol intake to no more than 2 drinks per day. I will be contacting patient with biopsy results and further recommendations.  Makaiya Geerdes U  10/09/2014  11:21 AM  CC: Dr. Glo Herring., MD & Dr. Rayne Du ref. provider found

## 2014-10-09 NOTE — Discharge Instructions (Signed)
No aspirin for 24 hours. Resume other medications as before. Pantoprazole 40 mg by mouth 30 minutes before breakfast and evening meal daily. Soft foods only. Keep HOB at 30 at all times. Physician will call with biopsy results.  Esophagogastroduodenoscopy Care After Refer to this sheet in the next few weeks. These instructions provide you with information on caring for yourself after your procedure. Your caregiver may also give you more specific instructions. Your treatment has been planned according to current medical practices, but problems sometimes occur. Call your caregiver if you have any problems or questions after your procedure.  HOME CARE INSTRUCTIONS  Do not eat or drink anything until the numbing medicine (local anesthetic) has worn off and your gag reflex has returned. You will know that the local anesthetic has worn off when you can swallow comfortably.  Do not drive for 12 hours after the procedure or as directed by your caregiver.  Only take medicines as directed by your caregiver. SEEK MEDICAL CARE IF:   You cannot stop coughing.  You are not urinating at all or less than usual. SEEK IMMEDIATE MEDICAL CARE IF:  You have difficulty swallowing.  You cannot eat or drink.  You have worsening throat or chest pain.  You have dizziness, lightheadedness, or you faint.  You have nausea or vomiting.  You have chills.  You have a fever.  You have severe abdominal pain.  You have black, tarry, or bloody stools. Document Released: 05/29/2012 Document Reviewed: 05/29/2012 [facility] Patient Information 2015 [place]. This information is not intended to replace advice given to you by your health care provider. Make sure you discuss any questions you have with your health care provider.   Hiatal Hernia A hiatal hernia occurs when part of your stomach slides above the muscle that separates your abdomen from your chest (diaphragm). You can be born with a hiatal  hernia (congenital), or it may develop over time. In almost all cases of hiatal hernia, only the top part of the stomach pushes through.  Many people have a hiatal hernia with no symptoms. The larger the hernia, the more likely that you will have symptoms. In some cases, a hiatal hernia allows stomach acid to flow back into the tube that carries food from your mouth to your stomach (esophagus). This may cause heartburn symptoms. Severe heartburn symptoms may mean you have developed a condition called gastroesophageal reflux disease (GERD).  CAUSES  Hiatal hernias are caused by a weakness in the opening (hiatus) where your esophagus passes through your diaphragm to attach to the upper part of your stomach. You may be born with a weakness in your hiatus, or a weakness can develop. RISK FACTORS Older age is a major risk factor for a hiatal hernia. Anything that increases pressure on your diaphragm can also increase your risk of a hiatal hernia. This includes:  Pregnancy.  Excess weight.  Frequent constipation. SIGNS AND SYMPTOMS  People with a hiatal hernia often have no symptoms. If symptoms develop, they are almost always caused by GERD. They may include:  Heartburn.  Belching.  Indigestion.  Trouble swallowing.  Coughing or wheezing.  Sore throat.  Hoarseness.  Chest pain. DIAGNOSIS  A hiatal hernia is sometimes found during an exam for another problem. Your health care provider may suspect a hiatal hernia if you have symptoms of GERD. Tests may be done to diagnose GERD. These may include:  X-rays of your stomach or chest.  An upper gastrointestinal (GI) series. This is an  X-ray exam of your GI tract involving the use of a chalky liquid that you swallow. The liquid shows up clearly on the X-ray.  Endoscopy. This is a procedure to look into your stomach using a thin, flexible tube that has a tiny camera and light on the end of it. TREATMENT  If you have no symptoms, you may not  need treatment. If you have symptoms, treatment may include:  Dietary and lifestyle changes to help reduce GERD symptoms.  Medicines. These may include:  Over-the-counter antacids.  Medicines that make your stomach empty more quickly.  Medicines that block the production of stomach acid (H2 blockers).  Stronger medicines to reduce stomach acid (proton pump inhibitors).  You may need surgery to repair the hernia if other treatments are not helping. HOME CARE INSTRUCTIONS   Take all medicines as directed by your health care provider.  Quit smoking, if you smoke.  Try to achieve and maintain a healthy body weight.  Eat frequent small meals instead of three large meals a day. This keeps your stomach from getting too full.  Eat slowly.  Do not lie down right after eating.  Do noteat 1-2 hours before bed.   Do not drink beverages with caffeine. These include cola, coffee, cocoa, and tea.  Do not drink alcohol.  Avoid foods that can make symptoms of GERD worse. These may include:  Fatty foods.  Citrus fruits.  Other foods and drinks that contain acid.  Avoid putting pressure on your belly. Anything that puts pressure on your belly increases the amount of acid that may be pushed up into your esophagus.   Avoid bending over, especially after eating.  Raise the head of your bed by putting blocks under the legs. This keeps your head and esophagus higher than your stomach.  Do not wear tight clothing around your chest or stomach.  Try not to strain when having a bowel movement, when urinating, or when lifting heavy objects. SEEK MEDICAL CARE IF:  Your symptoms are not controlled with medicines or lifestyle changes.  You are having trouble swallowing.  You have coughing or wheezing that will not go away. SEEK IMMEDIATE MEDICAL CARE IF:  Your pain is getting worse.  Your pain spreads to your arms, neck, jaw, teeth, or back.  You have shortness of breath.  You  sweat for no reason.  You feel sick to your stomach (nauseous) or vomit.  You vomit blood.  You have bright red blood in your stools.  You have black, tarry stools.  Document Released: 09/02/2003 Document Revised: 10/27/2013 Document Reviewed: 05/30/2013 [facility] Patient Information 2015 [place], [place]. This information is not intended to replace advice given to you by your health care provider. Make sure you discuss any questions you have with your health care provider.

## 2014-10-09 NOTE — H&P (Signed)
Jimmy Tucker is an 54 y.o. male.   Chief Complaint: Patient is here for EGD and ED. HPI: Patient is 54 year old Caucasian male who presents with 6 week history of dysphagia to solids. He's had multiple episodes of food impaction early with regurgitation. He states he used to have heartburn about 8 years ago which lasted for about 6 months and use lot of times but all of a sudden heartburn stopped. He denies anorexia weight loss or melena. He has quit cigarette smoking but he drinks alcohol every day anywhere from 2 to 6 cans per day. He had barium study recently showing thousand stricture distal esophagus well above GE junction. This is concerning for Barrett's esophagus with stricture.  Past Medical History  Diagnosis Date  . Chronic back pain   . Hypertension   . Anxiety   . Heavy cigarette smoker (20-39 per day)     Unmotivated  . PAD (peripheral artery disease) 10/30/2012    a) 2011 LEA Dopplers: Left ATA occlusion; b) 10/2013: LEA Dopplers - L Common Iliac & CFA occlusion - w/ short reconstitution at the branch point of the external and internal iliac, the external iliac is occluded through the common femoral and proximal SFA; reconstitutes in the proximal SFA. Two-vessel runoff beyond.; c) s/p LCIA Stent & CFA EA w/ patch angioplasty --> f/u doppler 12/01/2013 patent  . Hyperlipidemia LDL goal <70     Intolerant to statins  . Pollen allergies   . Chronic lower back pain     "L4-5"  . Lumbar disc disease   . Presence of stent in artery   . Carotid artery disease     Past Surgical History  Procedure Laterality Date  . Back surgery  02/2008  . Surgery scrotal / testicular Left     cyst excision  . Colonoscopy  07/24/2011    Procedure: COLONOSCOPY;  Surgeon: Daneil Dolin, MD;  Location: [facility];  Service: Endoscopy;  Laterality: N/A;  8:15  . Iliac artery stent Left 11/03/2013    Dr. Judithann Sauger  . Lumbar microdiscectomy Left 02/2008    L4-5  . Endarterectomy femoral Left 11/11/2013   Procedure: Left Femoral Endarterectomy with Patch Angioplasty ;  Surgeon: Angelia Mould, MD;  Location: [facility] OR;  Service: Vascular;  Laterality: Left;  . Nm myocar perf wall motion  07/2009    persantine - normal perfusion  . Coronary stent placement    . Lower extremity angiogram N/A 11/03/2013    Procedure: LOWER EXTREMITY ANGIOGRAM;  Surgeon: Lorretta Harp, MD;  Location: [facility] CATH LAB;  Service: Cardiovascular;  Laterality: N/A;    Family History  Problem Relation Age of Onset  . Colon cancer Neg Hx   . Heart attack Father 50    Cardiac arrest  . Sudden death Father   . Stroke Brother 47  . Stroke Mother    Social History:  reports that he quit smoking about 11 months ago. His smoking use included Cigarettes. He has a 40 pack-year smoking history. He has never used smokeless tobacco. He reports that he drinks about 12.6 oz of alcohol per week. He reports that he does not use illicit drugs.  Allergies: No Known Allergies  Facility-administered medications prior to admission  Medication Dose Route Frequency Provider Last Rate Last Dose  . promethazine (PHENERGAN) injection 25 mg  25 mg Intravenous Once Daneil Dolin, MD       Medications Prior to Admission  Medication Sig Dispense Refill  .  albuterol (PROAIR HFA) 108 (90 BASE) MCG/ACT inhaler Inhale 2 puffs into the lungs every 4 (four) hours as needed for wheezing or shortness of breath.     . ALPRAZolam (XANAX) 1 MG tablet Take 1 mg by mouth at bedtime as needed for anxiety.     Marland Kitchen aspirin EC 325 MG EC tablet Take 1 tablet (325 mg total) by mouth daily. 30 tablet 0  . folic acid (FOLVITE) 1 MG tablet Take 1 tablet (1 mg total) by mouth daily. 30 tablet 5  . gabapentin (NEURONTIN) 300 MG capsule Take 300 mg by mouth 3 (three) times daily.    Marland Kitchen losartan (COZAAR) 50 MG tablet Take 0.5 tablets (25 mg total) by mouth daily. 30 tablet 5  . Oxycodone HCl 20 MG TABS Take 1 tablet (20 mg total) by mouth every 4 (four) hours as  needed (pain). 30 tablet 0  . propranolol (INDERAL) 20 MG tablet Take 20 mg by mouth 2 (two) times daily.       No results found for this or any previous visit (from the past 48 hour(s)). No results found.  ROS  Blood pressure 132/74, pulse 66, temperature 98.2 F (36.8 C), temperature source Oral, resp. rate 14, height 5\' 6"  (1.676 m), weight 162 lb (73.483 kg), SpO2 100 %. Physical Exam  Constitutional: He appears well-developed and well-nourished.  HENT:  Mouth/Throat: Oropharynx is clear and moist.  Eyes: Conjunctivae are normal. No scleral icterus.  Neck: No thyromegaly present.  Cardiovascular: Normal rate, regular rhythm and normal heart sounds.   No murmur heard. Respiratory: Effort normal and breath sounds normal.  GI: Soft. He exhibits no distension and no mass. There is no tenderness.  Musculoskeletal: He exhibits no edema.  Lymphadenopathy:    He has no cervical adenopathy.  Neurological: He is alert.  Skin: Skin is warm and dry.     Assessment/Plan Solid food dysphagia. Esophageal stricture proximal to GE junction. EGD with ED.  REHMAN,NAJEEB U 10/09/2014, 10:35 AM

## 2014-10-12 ENCOUNTER — Telehealth (INDEPENDENT_AMBULATORY_CARE_PROVIDER_SITE_OTHER): Payer: Self-pay | Admitting: *Deleted

## 2014-10-12 ENCOUNTER — Ambulatory Visit (INDEPENDENT_AMBULATORY_CARE_PROVIDER_SITE_OTHER): Payer: Self-pay | Admitting: Internal Medicine

## 2014-10-12 ENCOUNTER — Other Ambulatory Visit (INDEPENDENT_AMBULATORY_CARE_PROVIDER_SITE_OTHER): Payer: Self-pay | Admitting: Internal Medicine

## 2014-10-12 ENCOUNTER — Encounter (INDEPENDENT_AMBULATORY_CARE_PROVIDER_SITE_OTHER): Payer: Self-pay | Admitting: *Deleted

## 2014-10-12 DIAGNOSIS — C159 Malignant neoplasm of esophagus, unspecified: Secondary | ICD-10-CM

## 2014-10-12 DIAGNOSIS — K2270 Barrett's esophagus without dysplasia: Secondary | ICD-10-CM

## 2014-10-12 NOTE — Progress Notes (Signed)
Presenting complaint;  Patient is here to discuss biopsy results.  Database; Patient is 54 year old Caucasian male who underwent esophagogastroduodenoscopy on 10/08/2013 for 6 week history of solid food dysphagia and abnormal barium study revealing stricture 5 cm proximal to GE junction with ulceration. EGD revealed high-grade stricture with ulceration in the proximal margin of Barrett's. This stricture was dilated with slim scope. 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 Patient was therefore advised to come to the office along with his wife. Patient says he's been able to swallow better. He is unable to swallow pantoprazole. Patient states he hasn't had heartburn for the last few years. He did have. Lasting for few months when he took Tums every day and then all of a sudden heartburn resolved but swallowing difficulty did not start until about 6 weeks    Current Medications: Outpatient Encounter Prescriptions as of 10/12/2014  Medication Sig  . albuterol (PROAIR HFA) 108 (90 BASE) MCG/ACT inhaler Inhale 2 puffs into the lungs every 4 (four) hours as needed for wheezing or shortness of breath.   . ALPRAZolam (XANAX) 1 MG tablet Take 1 mg by mouth at bedtime as needed for anxiety.   Marland Kitchen aspirin EC 325 MG EC tablet Take 1 tablet (325 mg total) by mouth daily.  . folic acid (FOLVITE) 1 MG tablet Take 1 tablet (1 mg total) by mouth daily.  Marland Kitchen gabapentin (NEURONTIN) 300 MG capsule Take 300 mg by mouth 3 (three) times daily.  Marland Kitchen losartan (COZAAR) 50 MG tablet Take 0.5 tablets (25 mg total) by mouth daily.  . Oxycodone HCl 20 MG TABS Take 1 tablet (20 mg total) by mouth every 4 (four) hours as needed (pain).  . pantoprazole (PROTONIX) 40 MG tablet Take 1 tablet (40 mg total) by mouth 2 (two) times daily before a meal.  . propranolol (INDERAL) 20 MG tablet Take 20 mg by mouth 2 (two) times daily.      Assessment:  #1. 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 Swallowing has improved since this stricture was dilated with the scope. If disease is localized patient would be candidate for surgery although he is deemed to be high risk and will have to be cleared by his cardiologist Dr. [name].   Plan:  Discontinue pantoprazole. Patient will check with his pharmacist if he can obtain liquid preparation. Prevacid solute tab is not covered and would cost him $500 a month. Will proceed with chest and abdominopelvic CT. If he does not have metastatic disease he would be referred for endoscopic ultrasound and surgical consultation. Patient and his wife had several questions which were answered.

## 2014-10-12 NOTE — Telephone Encounter (Signed)
Will change him to another PPI after checking with his pharmacist

## 2014-10-12 NOTE — Patient Instructions (Signed)
Physician will call with results of chest and abdominopelvic CT when completed

## 2014-10-12 NOTE — Telephone Encounter (Signed)
patient cannot swallow pantoprazole pill you gave him friday, he actually tried chewing the first one on Friday then he talked to pharmacist which suggested cutting it in half and taking with apple sauce and he cannot do that either and wants to know if he can have a liquid or chewable medicine in place of pantoprazole pill -- wife can be reached at [phone]

## 2014-10-13 ENCOUNTER — Encounter (INDEPENDENT_AMBULATORY_CARE_PROVIDER_SITE_OTHER): Payer: Self-pay | Admitting: *Deleted

## 2014-10-13 ENCOUNTER — Encounter (INDEPENDENT_AMBULATORY_CARE_PROVIDER_SITE_OTHER): Payer: Self-pay | Admitting: Internal Medicine

## 2014-10-14 ENCOUNTER — Encounter (HOSPITAL_COMMUNITY): Payer: Self-pay | Admitting: Internal Medicine

## 2014-10-15 ENCOUNTER — Telehealth (INDEPENDENT_AMBULATORY_CARE_PROVIDER_SITE_OTHER): Payer: Self-pay | Admitting: *Deleted

## 2014-10-15 NOTE — Telephone Encounter (Signed)
Per Dr.Rehman may call in Prevacid Solutab 30 mg - Take 1 by mouth daily #30 5 refills. This was called to [facility].

## 2014-10-16 ENCOUNTER — Telehealth (INDEPENDENT_AMBULATORY_CARE_PROVIDER_SITE_OTHER): Payer: Self-pay | Admitting: *Deleted

## 2014-10-16 ENCOUNTER — Ambulatory Visit (HOSPITAL_COMMUNITY)
Admission: RE | Admit: 2014-10-16 | Discharge: 2014-10-16 | Disposition: A | Discharge status: Home / Self Care | Payer: BLUE CROSS/BLUE SHIELD | Source: Ambulatory Visit | Attending: Internal Medicine | Admitting: Internal Medicine

## 2014-10-16 DIAGNOSIS — C159 Malignant neoplasm of esophagus, unspecified: Secondary | ICD-10-CM | POA: Diagnosis present

## 2014-10-16 MED ORDER — IOHEXOL 300 MG/ML  SOLN
100.0000 mL | Freq: Once | INTRAMUSCULAR | Status: AC | PRN
  Administered 2014-10-16: 100 mL via INTRAVENOUS

## 2014-10-16 NOTE — Telephone Encounter (Signed)
Call to patient's insurance. Nexium Suspension is covered on the patient's plan. Per Dr.Rehman - may call in Nexium Suspension 40 mg daily - 5 refills. [facility]. This medication will have to be ordered and patient should be able to pick it up on Monday. Patient was called and made aware.

## 2014-10-27 LAB — H. PYLORI ANTIBODY, IGG

## 2014-11-02 ENCOUNTER — Encounter (INDEPENDENT_AMBULATORY_CARE_PROVIDER_SITE_OTHER): Payer: Self-pay

## 2014-12-23 ENCOUNTER — Telehealth (INDEPENDENT_AMBULATORY_CARE_PROVIDER_SITE_OTHER): Payer: Self-pay | Admitting: *Deleted

## 2014-12-23 NOTE — Telephone Encounter (Signed)
Patient was called as ask by Dr.Rehman for a progress report. A voice message was left on his cell phone voice mail. I ask that he call our office back with a progress report.

## 2015-01-05 ENCOUNTER — Encounter (INDEPENDENT_AMBULATORY_CARE_PROVIDER_SITE_OTHER): Payer: Self-pay

## 2015-01-05 ENCOUNTER — Telehealth (HOSPITAL_COMMUNITY): Payer: Self-pay | Admitting: *Deleted

## 2015-01-06 ENCOUNTER — Encounter (INDEPENDENT_AMBULATORY_CARE_PROVIDER_SITE_OTHER): Payer: Self-pay

## 2015-01-15 ENCOUNTER — Other Ambulatory Visit (INDEPENDENT_AMBULATORY_CARE_PROVIDER_SITE_OTHER): Payer: Self-pay | Admitting: Internal Medicine

## 2015-01-15 MED ORDER — ESOMEPRAZOLE MAGNESIUM 40 MG PO PACK: 40.0000 mg | PACK | Freq: Every day | ORAL | Status: DC

## 2015-01-15 NOTE — Telephone Encounter (Signed)
Rx for Nexium Pack 90 day supply sent to his pharmacy

## 2015-01-21 ENCOUNTER — Other Ambulatory Visit (INDEPENDENT_AMBULATORY_CARE_PROVIDER_SITE_OTHER): Payer: Self-pay | Admitting: Internal Medicine

## 2015-01-21 MED ORDER — ESOMEPRAZOLE MAGNESIUM 40 MG PO PACK: 40.0000 mg | PACK | Freq: Every day | ORAL | Status: DC

## 2015-01-26 ENCOUNTER — Ambulatory Visit (INDEPENDENT_AMBULATORY_CARE_PROVIDER_SITE_OTHER): Payer: BLUE CROSS/BLUE SHIELD | Admitting: Cardiovascular Disease

## 2015-01-26 ENCOUNTER — Encounter: Payer: Self-pay | Admitting: Cardiovascular Disease

## 2015-01-26 VITALS — BP 118/78 | HR 72 | Ht 66.0 in | Wt 165.0 lb

## 2015-01-26 DIAGNOSIS — E785 Hyperlipidemia, unspecified: Secondary | ICD-10-CM | POA: Diagnosis not present

## 2015-01-26 DIAGNOSIS — I998 Other disorder of circulatory system: Secondary | ICD-10-CM

## 2015-01-26 DIAGNOSIS — I10 Essential (primary) hypertension: Secondary | ICD-10-CM

## 2015-01-26 DIAGNOSIS — I70229 Atherosclerosis of native arteries of extremities with rest pain, unspecified extremity: Secondary | ICD-10-CM

## 2015-01-26 NOTE — Assessment & Plan Note (Signed)
History of critical limb ischemia status post angiography 11/03/13 revealing a 99% calcified distal left common iliac artery stenosis which I stented using a 10 mm x 4 cm nitinol self expanding stent. He did have an occluded distal left common femoral artery and a diffusely diseased left SFA with two-vessel runoff. He subsequently underwent elective left common femoral endarterectomy with patch angioplasty by Dr. Scot Dock with an excellent angiographic and clinical result. His follow-up Dopplers performed 07/28/14 revealed a normal left ABI with widely patent left common iliac artery and left common femoral artery. His wounds healed. He denies claudication.

## 2015-01-26 NOTE — Assessment & Plan Note (Signed)
History of hyperlipidemia intolerant to statin drugs with recent lipid profile performed 05/18/14 revealed a total cholesterol 178, LDL of 62 and HDL of 54

## 2015-01-26 NOTE — Patient Instructions (Signed)
  We will see you back in follow up in 1 year with Dr [name].   Dr [name] has ordered: Leane Call- this is a test that looks at the blood flow to your heart muscle.  It takes approximately 2 1/2 hours. Please follow instruction sheet, as given.

## 2015-01-26 NOTE — Assessment & Plan Note (Signed)
History of hypertension blood pressure measured today 118/78. He is on losartan 50 mg and propranolol. Continue current meds at current dosing

## 2015-01-26 NOTE — Progress Notes (Signed)
01/26/2015 Jimmy Tucker   29-Apr-1961  [id number]  Primary Physician Jimmy Tucker., MD Primary Cardiologist: Jimmy Harp MD Jimmy Tucker   HPI:  [name] is a 54 year old Married Caucasian male who I saw several years ago. He currently is a patient of Dr. Gerarda Tucker in [place]. I last saw him in the office 07/28/14. He has a history of tobacco abuse (recently discontinued), hypertension and hyperlipidemia. Because of critical limb ischemia he had arterial Dopplers performed in our office on 5 10/30/2013 revealing an occluded common iliac, occluded SFA and proximal SFA.I performed angiography on him 11/03/13 and stented a subtotally occluded left common iliac artery. He did have an occluded left common femoral and diffuse SFA disease. Jimmy Tucker performed endarterectomy with bovine patch angioplasty of his occluded left common femoral artery on 11/11/13. Lower extremity arterial Doppler studies performed 07/28/14 revealed a normal left ABI with a widely patent left common iliac and common femoral endarterectomy site. His ulcers have healed. He has since stopped smoking. He denies chest pain or shortness of breath. Unfortunately, over the last several months he's been diagnosed with esophageal cancer and needs surgery.   Current Outpatient Prescriptions  Medication Sig Dispense Refill  . albuterol (PROAIR HFA) 108 (90 BASE) MCG/ACT inhaler Inhale 2 puffs into the lungs every 4 (four) hours as needed for wheezing or shortness of breath.     . ALPRAZolam (XANAX) 1 MG tablet Take 1 mg by mouth at bedtime as needed for anxiety.     Marland Kitchen aspirin EC 325 MG EC tablet Take 1 tablet (325 mg total) by mouth daily. 30 tablet 0  . DULoxetine (CYMBALTA) 30 MG capsule Take 30 mg by mouth daily.    Marland Kitchen esomeprazole (NEXIUM) 40 MG packet Take 40 mg by mouth daily before breakfast. 90 each 4  . folic acid (FOLVITE) 1 MG tablet Take 1 tablet (1 mg total) by mouth daily. 30 tablet 5  . gabapentin  (NEURONTIN) 300 MG capsule Take 300 mg by mouth 3 (three) times daily.    Marland Kitchen losartan (COZAAR) 50 MG tablet Take 0.5 tablets (25 mg total) by mouth daily. 30 tablet 5  . Oxycodone HCl 20 MG TABS Take 1 tablet (20 mg total) by mouth every 4 (four) hours as needed (pain). 30 tablet 0  . propranolol (INDERAL) 20 MG tablet Take 20 mg by mouth 2 (two) times daily.      Current Facility-Administered Medications  Medication Dose Route Frequency Provider Last Rate Last Dose  . promethazine (PHENERGAN) injection 25 mg  25 mg Intravenous Once Daneil Dolin, MD        No Known Allergies  History   Social History  . Marital Status: Married    Spouse Name: N/A  . Number of Children: N/A  . Years of Education: N/A   Occupational History  . [employer]      in [place]   Social History Main Topics  . Smoking status: Former Smoker -- 1.00 packs/day for 40 years    Types: Cigarettes    Quit date: 11/02/2013  . Smokeless tobacco: Never Used  . Alcohol Use: 12.6 oz/week    21 Cans of beer per week     Comment: every day   . Drug Use: No  . Sexual Activity: Not Currently   Other Topics Concern  . Not on file   Social History Narrative   Married, still smokes about a pack a day. Unable to work currently because of  his claudication.   He does work for [employer] in [place], [place].     Review of Systems: General: negative for chills, fever, night sweats or weight changes.  Cardiovascular: negative for chest pain, dyspnea on exertion, edema, orthopnea, palpitations, paroxysmal nocturnal dyspnea or shortness of breath Dermatological: negative for rash Respiratory: negative for cough or wheezing Urologic: negative for hematuria Abdominal: negative for nausea, vomiting, diarrhea, bright red blood per rectum, melena, or hematemesis Neurologic: negative for visual changes, syncope, or dizziness All other systems reviewed and are otherwise negative except as noted  above.    Blood pressure 118/78, pulse 72, height 5\' 6"  (1.676 m), weight 165 lb (74.844 kg).  General appearance: alert and no distress Neck: no adenopathy, no carotid bruit, no JVD, supple, symmetrical, trachea midline and thyroid not enlarged, symmetric, no tenderness/mass/nodules Lungs: clear to auscultation bilaterally Heart: regular rate and rhythm, S1, S2 normal, no murmur, click, rub or gallop Extremities: extremities normal, atraumatic, no cyanosis or edema  EKG not performed today  ASSESSMENT AND PLAN:   Hyperlipidemia LDL goal <70 History of hyperlipidemia intolerant to statin drugs with recent lipid profile performed 05/18/14 revealed a total cholesterol 178, LDL of 62 and HDL of 54  Essential hypertension History of hypertension blood pressure measured today 118/78. He is on losartan 50 mg and propranolol. Continue current meds at current dosing  Critical lower limb ischemia - poorly healing ulcer on left foot with occluded iliac, femoral artery on left History of critical limb ischemia status post angiography 11/03/13 revealing a 99% calcified distal left common iliac artery stenosis which I stented using a 10 mm x 4 cm nitinol self expanding stent. He did have an occluded distal left common femoral artery and a diffusely diseased left SFA with two-vessel runoff. He subsequently underwent elective left common femoral endarterectomy with patch angioplasty by Jimmy Tucker with an excellent angiographic and clinical result. His follow-up Dopplers performed 07/28/14 revealed a normal left ABI with widely patent left common iliac artery and left common femoral artery. His wounds healed. He denies claudication.      Jimmy Harp MD FACP,FACC,FAHA, [facility] 01/26/2015 10:39 AM

## 2015-02-02 ENCOUNTER — Telehealth (HOSPITAL_COMMUNITY): Payer: Self-pay

## 2015-02-02 NOTE — Telephone Encounter (Signed)
Encounter complete. 

## 2015-02-03 ENCOUNTER — Telehealth (HOSPITAL_COMMUNITY): Payer: Self-pay

## 2015-02-03 NOTE — Telephone Encounter (Signed)
Encounter complete. 

## 2015-02-04 ENCOUNTER — Ambulatory Visit (HOSPITAL_COMMUNITY)
Admission: RE | Admit: 2015-02-04 | Discharge: 2015-02-04 | Disposition: A | Discharge status: Home / Self Care | Payer: BLUE CROSS/BLUE SHIELD | Source: Ambulatory Visit | Attending: Cardiovascular Disease | Admitting: Cardiovascular Disease

## 2015-02-04 DIAGNOSIS — R9439 Abnormal result of other cardiovascular function study: Secondary | ICD-10-CM | POA: Insufficient documentation

## 2015-02-04 DIAGNOSIS — E785 Hyperlipidemia, unspecified: Secondary | ICD-10-CM

## 2015-02-04 DIAGNOSIS — R5383 Other fatigue: Secondary | ICD-10-CM | POA: Diagnosis present

## 2015-02-04 DIAGNOSIS — I10 Essential (primary) hypertension: Secondary | ICD-10-CM

## 2015-02-04 LAB — MYOCARDIAL PERFUSION IMAGING
CHL CUP RESTING HR STRESS: 70 {beats}/min
LV dias vol: 87 mL
LVSYSVOL: 34 mL
NUC STRESS TID: 1.37
Peak HR: 105 {beats}/min
SDS: 2
SRS: 1
SSS: 3

## 2015-02-04 MED ORDER — TECHNETIUM TC 99M SESTAMIBI GENERIC - CARDIOLITE
10.5000 | Freq: Once | INTRAVENOUS | Status: AC | PRN
  Administered 2015-02-04: 11 via INTRAVENOUS

## 2015-02-04 MED ORDER — TECHNETIUM TC 99M SESTAMIBI GENERIC - CARDIOLITE
30.8000 | Freq: Once | INTRAVENOUS | Status: AC | PRN
  Administered 2015-02-04: 30.8 via INTRAVENOUS

## 2015-02-04 MED ORDER — REGADENOSON 0.4 MG/5ML IV SOLN
0.4000 mg | Freq: Once | INTRAVENOUS | Status: AC
  Administered 2015-02-04: 0.4 mg via INTRAVENOUS

## 2015-02-09 ENCOUNTER — Encounter: Payer: Self-pay | Admitting: Cardiovascular Disease

## 2015-02-09 ENCOUNTER — Ambulatory Visit (INDEPENDENT_AMBULATORY_CARE_PROVIDER_SITE_OTHER): Payer: BLUE CROSS/BLUE SHIELD | Admitting: Cardiovascular Disease

## 2015-02-09 VITALS — BP 118/80 | HR 80 | Ht 66.0 in | Wt 164.1 lb

## 2015-02-09 DIAGNOSIS — Z01812 Encounter for preprocedural laboratory examination: Secondary | ICD-10-CM

## 2015-02-09 DIAGNOSIS — D689 Coagulation defect, unspecified: Secondary | ICD-10-CM

## 2015-02-09 DIAGNOSIS — R931 Abnormal findings on diagnostic imaging of heart and coronary circulation: Secondary | ICD-10-CM | POA: Diagnosis not present

## 2015-02-09 DIAGNOSIS — R5381 Other malaise: Secondary | ICD-10-CM | POA: Diagnosis not present

## 2015-02-09 DIAGNOSIS — R9439 Abnormal result of other cardiovascular function study: Secondary | ICD-10-CM | POA: Insufficient documentation

## 2015-02-09 DIAGNOSIS — R5383 Other fatigue: Secondary | ICD-10-CM

## 2015-02-09 NOTE — Assessment & Plan Note (Signed)
Jimmy Tucker presents today for follow-up of his Myoview stress test performed on 02/04/15 done for preoperative clearance before extensive thoracic surgery. While the test was read as "low risk there was ischemia in the RCA territory. The patient has no symptoms. I'm concerned with his risk factors and his extensive PAD that the stress test may underestimate the degree of coronary atherosclerosis and favor coronary angiography to further risk stratify him prior to his upcoming procedure.The patient understands that risks included but are not limited to stroke (1 in 1000), death (1 in 11), kidney failure [usually temporary] (1 in 500), bleeding (1 in 200), allergic reaction [possibly serious] (1 in 200). The patient understands and agrees to proceedcoronary angiography to risk stratify him for his upcoming procedure.

## 2015-02-09 NOTE — Patient Instructions (Signed)
Dr Evangeline Gula has requested that you have a cardiac catheterization. Cardiac catheterization is used to diagnose and/or treat various heart conditions. Doctors may recommend this procedure for a number of different reasons. The most common reason is to evaluate chest pain. Chest pain can be a symptom of coronary artery disease (CAD), and cardiac catheterization can show whether plaque is narrowing or blocking your heart's arteries. This procedure is also used to evaluate the valves, as well as measure the blood flow and oxygen levels in different parts of your heart. For further information please visit [email or web address]. Please follow instruction sheet, as given.  You will be required to have the following tests prior to the procedure:  1. Blood work-the blood work can be done no more than 7 days prior to the procedure.  It can be done at any [facility] lab.  There is one downstairs on the first floor of this building and one in the [place] ([place], [address], phone [phone])  2. Chest Xray-the chest xray order has already been placed at [facility] in the [place] ([place], [address]).

## 2015-02-09 NOTE — Progress Notes (Signed)
Jimmy Tucker presents today for follow-up of his Myoview stress test performed on 02/04/15 done for preoperative clearance before extensive thoracic surgery. While the test was read as "low risk there was ischemia in the RCA territory. The patient has no symptoms. I'm concerned with his risk factors and his extensive PAD that the stress test may underestimate the degree of coronary atherosclerosis and favor coronary angiography to further risk stratify him prior to his upcoming procedure.The patient understands that risks included but are not limited to stroke (1 in 1000), death (1 in 69), kidney failure [usually temporary] (1 in 500), bleeding (1 in 200), allergic reaction [possibly serious] (1 in 200). The patient understands and agrees to proceedcoronary angiography to risk stratify him for his upcoming procedur  Lorretta Harp, M.D., [place], [facility], [address] [place]. [place], [place]  [address]  [phone] 02/09/2015 8:48 AM

## 2015-02-11 ENCOUNTER — Telehealth: Payer: Self-pay | Admitting: Cardiovascular Disease

## 2015-02-11 NOTE — Telephone Encounter (Signed)
Returned call to patient.He stated he saw Dr.Berry this week and he ordered lab and cxr.He wants to have cxr at [facility].Advised cxr order in.He does not need a appointment for cxr.Advised he can have cxr done at [facility] before 4:30 pm.

## 2015-02-11 NOTE — Telephone Encounter (Signed)
Per Answering Service:Blood work/ needs am order for an x-ray for [facility].

## 2015-02-12 ENCOUNTER — Ambulatory Visit (HOSPITAL_COMMUNITY)
Admission: RE | Admit: 2015-02-12 | Discharge: 2015-02-12 | Disposition: A | Discharge status: Home / Self Care | Payer: BLUE CROSS/BLUE SHIELD | Source: Ambulatory Visit | Attending: Cardiovascular Disease | Admitting: Cardiovascular Disease

## 2015-02-12 DIAGNOSIS — Z01818 Encounter for other preprocedural examination: Secondary | ICD-10-CM | POA: Insufficient documentation

## 2015-02-12 DIAGNOSIS — C159 Malignant neoplasm of esophagus, unspecified: Secondary | ICD-10-CM | POA: Insufficient documentation

## 2015-02-12 DIAGNOSIS — R9439 Abnormal result of other cardiovascular function study: Secondary | ICD-10-CM

## 2015-02-12 DIAGNOSIS — Z01812 Encounter for preprocedural laboratory examination: Secondary | ICD-10-CM

## 2015-02-13 LAB — CBC
HCT: 39.9 % (ref 39.0–52.0)
Hemoglobin: 13.6 g/dL (ref 13.0–17.0)
MCH: 32.3 pg (ref 26.0–34.0)
MCHC: 34.1 g/dL (ref 30.0–36.0)
MCV: 94.8 fL (ref 78.0–100.0)
MPV: 9.6 fL (ref 8.6–12.4)
PLATELETS: 311 10*3/uL (ref 150–400)
RBC: 4.21 MIL/uL — ABNORMAL LOW (ref 4.22–5.81)
RDW: 14.2 % (ref 11.5–15.5)
WBC: 5.4 10*3/uL (ref 4.0–10.5)

## 2015-02-13 LAB — BASIC METABOLIC PANEL
BUN: 7 mg/dL (ref 7–25)
CO2: 25 mmol/L (ref 20–31)
CREATININE: 0.94 mg/dL (ref 0.70–1.33)
Calcium: 8.8 mg/dL (ref 8.6–10.3)
Chloride: 92 mmol/L — ABNORMAL LOW (ref 98–110)
Glucose, Bld: 94 mg/dL (ref 65–99)
Potassium: 4.1 mmol/L (ref 3.5–5.3)
Sodium: 131 mmol/L — ABNORMAL LOW (ref 135–146)

## 2015-02-13 LAB — TSH: TSH: 5.25 u[IU]/mL — ABNORMAL HIGH (ref 0.350–4.500)

## 2015-02-13 LAB — PROTIME-INR
INR: 0.98 (ref <1.50)
PROTHROMBIN TIME: 13 s (ref 11.6–15.2)

## 2015-02-13 LAB — APTT: aPTT: 28 seconds (ref 24–37)

## 2015-02-15 ENCOUNTER — Encounter (HOSPITAL_COMMUNITY): Payer: Self-pay | Admitting: Cardiovascular Disease

## 2015-02-15 ENCOUNTER — Encounter (HOSPITAL_COMMUNITY)
Admission: RE | Disposition: A | Discharge status: Home / Self Care | Payer: BLUE CROSS/BLUE SHIELD | Source: Ambulatory Visit | Attending: Cardiovascular Disease

## 2015-02-15 ENCOUNTER — Ambulatory Visit (HOSPITAL_COMMUNITY)
Admission: RE | Admit: 2015-02-15 | Discharge: 2015-02-15 | Disposition: A | Discharge status: Home / Self Care | Payer: BLUE CROSS/BLUE SHIELD | Source: Ambulatory Visit | Attending: Cardiovascular Disease | Admitting: Cardiovascular Disease

## 2015-02-15 DIAGNOSIS — I2510 Atherosclerotic heart disease of native coronary artery without angina pectoris: Secondary | ICD-10-CM | POA: Insufficient documentation

## 2015-02-15 DIAGNOSIS — R9439 Abnormal result of other cardiovascular function study: Secondary | ICD-10-CM

## 2015-02-15 DIAGNOSIS — I2583 Coronary atherosclerosis due to lipid rich plaque: Secondary | ICD-10-CM | POA: Diagnosis not present

## 2015-02-15 HISTORY — PX: CARDIAC CATHETERIZATION: SHX172

## 2015-02-15 SURGERY — LEFT HEART CATH AND CORONARY ANGIOGRAPHY

## 2015-02-15 MED ORDER — MIDAZOLAM HCL 2 MG/2ML IJ SOLN
INTRAMUSCULAR | Status: AC
  Filled 2015-02-15: qty 4

## 2015-02-15 MED ORDER — NITROGLYCERIN 1 MG/10 ML FOR IR/CATH LAB
INTRA_ARTERIAL | Status: AC
  Filled 2015-02-15: qty 10

## 2015-02-15 MED ORDER — IOHEXOL 350 MG/ML SOLN
INTRAVENOUS | Status: DC | PRN
  Administered 2015-02-15: 75 mL via INTRACARDIAC

## 2015-02-15 MED ORDER — ACETAMINOPHEN 325 MG PO TABS: 650.0000 mg | ORAL_TABLET | ORAL | Status: DC | PRN

## 2015-02-15 MED ORDER — SODIUM CHLORIDE 0.9 % IJ SOLN: 3.0000 mL | INTRAMUSCULAR | Status: DC | PRN

## 2015-02-15 MED ORDER — HEPARIN SODIUM (PORCINE) 1000 UNIT/ML IJ SOLN
INTRAMUSCULAR | Status: AC
  Filled 2015-02-15: qty 1

## 2015-02-15 MED ORDER — FENTANYL CITRATE (PF) 100 MCG/2ML IJ SOLN
INTRAMUSCULAR | Status: AC
  Filled 2015-02-15: qty 4

## 2015-02-15 MED ORDER — RADIAL COCKTAIL (HEPARIN/VERAPAMIL/LIDOCAINE/NITRO)
Status: DC | PRN
  Administered 2015-02-15: 15 mL via INTRA_ARTERIAL

## 2015-02-15 MED ORDER — ONDANSETRON HCL 4 MG/2ML IJ SOLN: 4.0000 mg | Freq: Four times a day (QID) | INTRAMUSCULAR | Status: DC | PRN

## 2015-02-15 MED ORDER — HEPARIN SODIUM (PORCINE) 1000 UNIT/ML IJ SOLN
INTRAMUSCULAR | Status: DC | PRN
  Administered 2015-02-15: 3500 [IU] via INTRAVENOUS

## 2015-02-15 MED ORDER — SODIUM CHLORIDE 0.9 % IJ SOLN: 3.0000 mL | Freq: Two times a day (BID) | INTRAMUSCULAR | Status: DC

## 2015-02-15 MED ORDER — MIDAZOLAM HCL 2 MG/2ML IJ SOLN
INTRAMUSCULAR | Status: DC | PRN
  Administered 2015-02-15: 1 mg via INTRAVENOUS

## 2015-02-15 MED ORDER — MORPHINE SULFATE (PF) 2 MG/ML IV SOLN: 2.0000 mg | INTRAVENOUS | Status: DC | PRN

## 2015-02-15 MED ORDER — FENTANYL CITRATE (PF) 100 MCG/2ML IJ SOLN
INTRAMUSCULAR | Status: DC | PRN
  Administered 2015-02-15: 25 ug via INTRAVENOUS

## 2015-02-15 MED ORDER — SODIUM CHLORIDE 0.9 % IV SOLN: 250.0000 mL | INTRAVENOUS | Status: DC | PRN

## 2015-02-15 MED ORDER — HEPARIN (PORCINE) IN NACL 2-0.9 UNIT/ML-% IJ SOLN
INTRAMUSCULAR | Status: AC
  Filled 2015-02-15: qty 1500

## 2015-02-15 MED ORDER — LIDOCAINE HCL (PF) 1 % IJ SOLN
INTRAMUSCULAR | Status: AC
  Filled 2015-02-15: qty 30

## 2015-02-15 MED ORDER — SODIUM CHLORIDE 0.9 % WEIGHT BASED INFUSION
3.0000 mL/kg/h | INTRAVENOUS | Status: DC
Start: 2015-02-16 — End: 2015-02-15
  Administered 2015-02-15: 3 mL/kg/h via INTRAVENOUS

## 2015-02-15 MED ORDER — SODIUM CHLORIDE 0.9 % WEIGHT BASED INFUSION
1.0000 mL/kg/h | INTRAVENOUS | Status: DC
Start: 2015-02-16 — End: 2015-02-15

## 2015-02-15 MED ORDER — ASPIRIN 81 MG PO CHEW: 81.0000 mg | CHEWABLE_TABLET | ORAL | Status: DC

## 2015-02-15 MED ORDER — VERAPAMIL HCL 2.5 MG/ML IV SOLN
INTRAVENOUS | Status: AC
  Filled 2015-02-15: qty 2

## 2015-02-15 MED ORDER — SODIUM CHLORIDE 0.9 % WEIGHT BASED INFUSION: 3.0000 mL/kg/h | INTRAVENOUS | Status: AC

## 2015-02-15 SURGICAL SUPPLY — 11 items
CATH INFINITI 5FR ANG PIGTAIL (CATHETERS) ×3 IMPLANT
CATH OPTITORQUE TIG 4.0 5F (CATHETERS) ×3 IMPLANT
DEVICE RAD COMP TR BAND LRG (VASCULAR PRODUCTS) ×3 IMPLANT
GLIDESHEATH SLEND A-KIT 6F 22G (SHEATH) ×3 IMPLANT
KIT HEART LEFT (KITS) ×3 IMPLANT
PACK CARDIAC CATHETERIZATION (CUSTOM PROCEDURE TRAY) ×3 IMPLANT
SYR MEDRAD MARK V 150ML (SYRINGE) ×3 IMPLANT
TRANSDUCER W/STOPCOCK (MISCELLANEOUS) ×3 IMPLANT
TUBING CIL FLEX 10 FLL-RA (TUBING) ×3 IMPLANT
WIRE HI TORQ VERSACORE-J 145CM (WIRE) ×3 IMPLANT
WIRE SAFE-T 1.5MM-J .035X260CM (WIRE) ×3 IMPLANT

## 2015-02-15 NOTE — H&P (View-Only) (Signed)
Jimmy Tucker presents today for follow-up of his Myoview stress test performed on 02/04/15 done for preoperative clearance before extensive thoracic surgery. While the test was read as "low risk there was ischemia in the RCA territory. The patient has no symptoms. I'm concerned with his risk factors and his extensive PAD that the stress test may underestimate the degree of coronary atherosclerosis and favor coronary angiography to further risk stratify him prior to his upcoming procedure.The patient understands that risks included but are not limited to stroke (1 in 1000), death (1 in 60), kidney failure [usually temporary] (1 in 500), bleeding (1 in 200), allergic reaction [possibly serious] (1 in 200). The patient understands and agrees to proceedcoronary angiography to risk stratify him for his upcoming procedur  Lorretta Harp, M.D., [place], [facility], [address] [place]. [place], [place]  [address]  [phone] 02/09/2015 8:48 AM

## 2015-02-15 NOTE — Interval H&P Note (Signed)
Cath Lab Visit (complete for each Cath Lab visit)  Clinical Evaluation Leading to the Procedure:   ACS: No.  Non-ACS:    Anginal Classification: No Symptoms  Anti-ischemic medical therapy: Minimal Therapy (1 class of medications)  Non-Invasive Test Results: Intermediate-risk stress test findings: cardiac mortality 1-3%/year  Prior CABG: No previous CABG      History and Physical Interval Note:  02/15/2015 8:57 AM  Jimmy Tucker  has presented today for surgery, with the diagnosis of abnoraml stress test  The various methods of treatment have been discussed with the patient and family. After consideration of risks, benefits and other options for treatment, the patient has consented to  Procedure(s): Left Heart Cath and Coronary Angiography (N/A) as a surgical intervention .  The patient's history has been reviewed, patient examined, no change in status, stable for surgery.  I have reviewed the patient's chart and labs.  Questions were answered to the patient's satisfaction.     Jimmy Tucker

## 2015-02-15 NOTE — Discharge Instructions (Signed)
Radial Site Care °Refer to this sheet in the next few weeks. These instructions provide you with information on caring for yourself after your procedure. Your caregiver may also give you more specific instructions. Your treatment has been planned according to current medical practices, but problems sometimes occur. Call your caregiver if you have any problems or questions after your procedure. °HOME CARE INSTRUCTIONS °· You may shower the day after the procedure. Remove the bandage (dressing) and gently wash the site with plain soap and water. Gently pat the site dry. °· Do not apply powder or lotion to the site. °· Do not submerge the affected site in water for 3 to 5 days. °· Inspect the site at least twice daily. °· Do not flex or bend the affected arm for 24 hours. °· No lifting over 5 pounds (2.3 kg) for 5 days after your procedure. °· Do not drive home if you are discharged the same day of the procedure. Have someone else drive you. °· You may drive 24 hours after the procedure unless otherwise instructed by your caregiver. °· Do not operate machinery or power tools for 24 hours. °· A responsible adult should be with you for the first 24 hours after you arrive home. °What to expect: °· Any bruising will usually fade within 1 to 2 weeks. °· Blood that collects in the tissue (hematoma) may be painful to the touch. It should usually decrease in size and tenderness within 1 to 2 weeks. °SEEK IMMEDIATE MEDICAL CARE IF: °· You have unusual pain at the radial site. °· You have redness, warmth, swelling, or pain at the radial site. °· You have drainage (other than a small amount of blood on the dressing). °· You have chills. °· You have a fever or persistent symptoms for more than 72 hours. °· You have a fever and your symptoms suddenly get worse. °· Your arm becomes pale, cool, tingly, or numb. °· You have heavy bleeding from the site. Hold pressure on the site and call 911. °Document Released: 07/15/2010 Document  Revised: 09/04/2011 Document Reviewed: 07/15/2010 °ExitCare® Patient Information ©2015 [employer]. This information is not intended to replace advice given to you by your health care provider. Make sure you discuss any questions you have with your health care provider. ° °

## 2015-02-16 ENCOUNTER — Telehealth (INDEPENDENT_AMBULATORY_CARE_PROVIDER_SITE_OTHER): Payer: Self-pay | Admitting: *Deleted

## 2015-02-16 MED FILL — Lidocaine HCl Local Preservative Free (PF) Inj 1%: INTRAMUSCULAR | Qty: 30 | Status: AC

## 2015-02-16 MED FILL — Heparin Sodium (Porcine) 2 Unit/ML in Sodium Chloride 0.9%: INTRAMUSCULAR | Qty: 1500 | Status: AC

## 2015-02-16 MED FILL — Nitroglycerin IV Soln 100 MCG/ML in D5W: INTRA_ARTERIAL | Qty: 10 | Status: AC

## 2015-02-16 NOTE — Telephone Encounter (Signed)
I will address this with Dr.Rehman on 02/17/15.

## 2015-02-16 NOTE — Telephone Encounter (Signed)
Jimmy Tucker said Dr. 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 He has not had surgery yet. Wanted Dr. Laural Golden to know.  They keep postponing the surgery due to stress test being abnormal.  Cardiologist has given him the go head. His return phone number is [phone]. Also please tell Dr. Laural Golden, thanks for all her has done for him.

## 2015-02-18 NOTE — Telephone Encounter (Signed)
Dr.Rehman was made aware of this patient's status on 02/17/15.

## 2015-03-03 DIAGNOSIS — Z0279 Encounter for issue of other medical certificate: Secondary | ICD-10-CM

## 2015-03-10 ENCOUNTER — Ambulatory Visit: Payer: BLUE CROSS/BLUE SHIELD | Admitting: Physician Assistant

## 2015-07-20 ENCOUNTER — Other Ambulatory Visit (HOSPITAL_COMMUNITY): Payer: Self-pay | Admitting: Oncology

## 2015-07-20 DIAGNOSIS — C155 Malignant neoplasm of lower third of esophagus: Secondary | ICD-10-CM

## 2015-07-26 ENCOUNTER — Other Ambulatory Visit (HOSPITAL_COMMUNITY): Payer: BLUE CROSS/BLUE SHIELD

## 2015-07-28 ENCOUNTER — Ambulatory Visit (HOSPITAL_COMMUNITY): Payer: BLUE CROSS/BLUE SHIELD

## 2017-07-20 DIAGNOSIS — Z1389 Encounter for screening for other disorder: Secondary | ICD-10-CM | POA: Diagnosis not present

## 2017-07-20 DIAGNOSIS — I739 Peripheral vascular disease, unspecified: Secondary | ICD-10-CM | POA: Diagnosis not present

## 2017-07-20 DIAGNOSIS — I10 Essential (primary) hypertension: Secondary | ICD-10-CM | POA: Diagnosis not present

## 2017-07-20 DIAGNOSIS — J449 Chronic obstructive pulmonary disease, unspecified: Secondary | ICD-10-CM | POA: Diagnosis not present

## 2017-07-20 DIAGNOSIS — J9801 Acute bronchospasm: Secondary | ICD-10-CM | POA: Diagnosis not present

## 2017-10-18 DIAGNOSIS — J9811 Atelectasis: Secondary | ICD-10-CM | POA: Diagnosis not present

## 2017-10-18 DIAGNOSIS — Z9889 Other specified postprocedural states: Secondary | ICD-10-CM | POA: Diagnosis not present

## 2017-10-18 DIAGNOSIS — C159 Malignant neoplasm of esophagus, unspecified: Secondary | ICD-10-CM | POA: Diagnosis not present

## 2017-10-18 DIAGNOSIS — K912 Postsurgical malabsorption, not elsewhere classified: Secondary | ICD-10-CM | POA: Diagnosis not present

## 2017-10-18 DIAGNOSIS — E871 Hypo-osmolality and hyponatremia: Secondary | ICD-10-CM | POA: Diagnosis not present

## 2017-11-01 DIAGNOSIS — K222 Esophageal obstruction: Secondary | ICD-10-CM | POA: Diagnosis not present

## 2017-11-01 DIAGNOSIS — Z8501 Personal history of malignant neoplasm of esophagus: Secondary | ICD-10-CM | POA: Diagnosis not present

## 2017-11-01 DIAGNOSIS — C155 Malignant neoplasm of lower third of esophagus: Secondary | ICD-10-CM | POA: Diagnosis not present

## 2017-11-01 DIAGNOSIS — R1310 Dysphagia, unspecified: Secondary | ICD-10-CM | POA: Diagnosis not present

## 2017-11-01 DIAGNOSIS — Z9889 Other specified postprocedural states: Secondary | ICD-10-CM | POA: Diagnosis not present

## 2017-11-01 DIAGNOSIS — Z87891 Personal history of nicotine dependence: Secondary | ICD-10-CM | POA: Diagnosis not present

## 2017-11-01 DIAGNOSIS — Z9049 Acquired absence of other specified parts of digestive tract: Secondary | ICD-10-CM | POA: Diagnosis not present

## 2017-11-01 DIAGNOSIS — Z8719 Personal history of other diseases of the digestive system: Secondary | ICD-10-CM | POA: Diagnosis not present

## 2017-11-08 DIAGNOSIS — Z9049 Acquired absence of other specified parts of digestive tract: Secondary | ICD-10-CM | POA: Diagnosis not present

## 2017-11-08 DIAGNOSIS — G8929 Other chronic pain: Secondary | ICD-10-CM | POA: Diagnosis not present

## 2017-11-08 DIAGNOSIS — C159 Malignant neoplasm of esophagus, unspecified: Secondary | ICD-10-CM | POA: Diagnosis not present

## 2017-11-08 DIAGNOSIS — I10 Essential (primary) hypertension: Secondary | ICD-10-CM | POA: Diagnosis not present

## 2017-11-08 DIAGNOSIS — J449 Chronic obstructive pulmonary disease, unspecified: Secondary | ICD-10-CM | POA: Diagnosis not present

## 2017-11-08 DIAGNOSIS — Z9889 Other specified postprocedural states: Secondary | ICD-10-CM | POA: Diagnosis not present

## 2017-11-08 DIAGNOSIS — K219 Gastro-esophageal reflux disease without esophagitis: Secondary | ICD-10-CM | POA: Diagnosis not present

## 2017-11-08 DIAGNOSIS — I739 Peripheral vascular disease, unspecified: Secondary | ICD-10-CM | POA: Diagnosis not present

## 2017-11-08 DIAGNOSIS — E039 Hypothyroidism, unspecified: Secondary | ICD-10-CM | POA: Diagnosis not present

## 2017-11-08 DIAGNOSIS — K2950 Unspecified chronic gastritis without bleeding: Secondary | ICD-10-CM | POA: Diagnosis not present

## 2017-11-08 DIAGNOSIS — Z8501 Personal history of malignant neoplasm of esophagus: Secondary | ICD-10-CM | POA: Diagnosis not present

## 2017-11-08 DIAGNOSIS — K222 Esophageal obstruction: Secondary | ICD-10-CM | POA: Diagnosis not present

## 2017-11-15 DIAGNOSIS — K222 Esophageal obstruction: Secondary | ICD-10-CM | POA: Diagnosis not present

## 2017-11-15 DIAGNOSIS — K9189 Other postprocedural complications and disorders of digestive system: Secondary | ICD-10-CM | POA: Diagnosis not present

## 2017-11-15 DIAGNOSIS — Z9049 Acquired absence of other specified parts of digestive tract: Secondary | ICD-10-CM | POA: Diagnosis not present

## 2017-11-15 DIAGNOSIS — Z8501 Personal history of malignant neoplasm of esophagus: Secondary | ICD-10-CM | POA: Diagnosis not present

## 2017-11-15 DIAGNOSIS — Z9889 Other specified postprocedural states: Secondary | ICD-10-CM | POA: Diagnosis not present

## 2017-11-16 DIAGNOSIS — E871 Hypo-osmolality and hyponatremia: Secondary | ICD-10-CM | POA: Diagnosis not present

## 2017-11-16 DIAGNOSIS — Z1389 Encounter for screening for other disorder: Secondary | ICD-10-CM | POA: Diagnosis not present

## 2017-11-16 DIAGNOSIS — G894 Chronic pain syndrome: Secondary | ICD-10-CM | POA: Diagnosis not present

## 2017-11-16 DIAGNOSIS — I739 Peripheral vascular disease, unspecified: Secondary | ICD-10-CM | POA: Diagnosis not present

## 2017-11-16 DIAGNOSIS — K219 Gastro-esophageal reflux disease without esophagitis: Secondary | ICD-10-CM | POA: Diagnosis not present

## 2017-11-29 DIAGNOSIS — K9189 Other postprocedural complications and disorders of digestive system: Secondary | ICD-10-CM | POA: Diagnosis not present

## 2017-11-29 DIAGNOSIS — Z8501 Personal history of malignant neoplasm of esophagus: Secondary | ICD-10-CM | POA: Diagnosis not present

## 2017-11-29 DIAGNOSIS — K2970 Gastritis, unspecified, without bleeding: Secondary | ICD-10-CM | POA: Diagnosis not present

## 2017-11-29 DIAGNOSIS — Z87891 Personal history of nicotine dependence: Secondary | ICD-10-CM | POA: Diagnosis not present

## 2017-11-29 DIAGNOSIS — T182XXA Foreign body in stomach, initial encounter: Secondary | ICD-10-CM | POA: Diagnosis not present

## 2017-11-29 DIAGNOSIS — K222 Esophageal obstruction: Secondary | ICD-10-CM | POA: Diagnosis not present

## 2017-11-29 DIAGNOSIS — K2960 Other gastritis without bleeding: Secondary | ICD-10-CM | POA: Diagnosis not present

## 2017-11-29 DIAGNOSIS — K449 Diaphragmatic hernia without obstruction or gangrene: Secondary | ICD-10-CM | POA: Diagnosis not present

## 2017-11-29 DIAGNOSIS — R1310 Dysphagia, unspecified: Secondary | ICD-10-CM | POA: Diagnosis not present

## 2017-12-06 DIAGNOSIS — K222 Esophageal obstruction: Secondary | ICD-10-CM | POA: Diagnosis not present

## 2017-12-06 DIAGNOSIS — I4891 Unspecified atrial fibrillation: Secondary | ICD-10-CM | POA: Diagnosis not present

## 2017-12-06 DIAGNOSIS — E871 Hypo-osmolality and hyponatremia: Secondary | ICD-10-CM | POA: Diagnosis not present

## 2017-12-06 DIAGNOSIS — I10 Essential (primary) hypertension: Secondary | ICD-10-CM | POA: Diagnosis not present

## 2017-12-20 DIAGNOSIS — R791 Abnormal coagulation profile: Secondary | ICD-10-CM | POA: Diagnosis not present

## 2017-12-20 DIAGNOSIS — Z79899 Other long term (current) drug therapy: Secondary | ICD-10-CM | POA: Diagnosis not present

## 2017-12-23 ENCOUNTER — Encounter (HOSPITAL_COMMUNITY): Payer: Self-pay | Admitting: Emergency Medicine

## 2017-12-23 ENCOUNTER — Other Ambulatory Visit: Payer: Self-pay

## 2017-12-23 ENCOUNTER — Inpatient Hospital Stay (HOSPITAL_COMMUNITY)
Admission: EM | Admit: 2017-12-23 | Discharge: 2017-12-25 | DRG: 641 | Disposition: A | Discharge status: Home / Self Care | Payer: Medicare Other | Attending: Internal Medicine | Admitting: Internal Medicine

## 2017-12-23 DIAGNOSIS — Z7901 Long term (current) use of anticoagulants: Secondary | ICD-10-CM | POA: Diagnosis not present

## 2017-12-23 DIAGNOSIS — Z888 Allergy status to other drugs, medicaments and biological substances status: Secondary | ICD-10-CM | POA: Diagnosis not present

## 2017-12-23 DIAGNOSIS — E871 Hypo-osmolality and hyponatremia: Secondary | ICD-10-CM | POA: Diagnosis not present

## 2017-12-23 DIAGNOSIS — I4891 Unspecified atrial fibrillation: Secondary | ICD-10-CM | POA: Diagnosis present

## 2017-12-23 DIAGNOSIS — I2510 Atherosclerotic heart disease of native coronary artery without angina pectoris: Secondary | ICD-10-CM | POA: Diagnosis not present

## 2017-12-23 DIAGNOSIS — I480 Paroxysmal atrial fibrillation: Secondary | ICD-10-CM | POA: Diagnosis present

## 2017-12-23 DIAGNOSIS — Z8249 Family history of ischemic heart disease and other diseases of the circulatory system: Secondary | ICD-10-CM | POA: Diagnosis not present

## 2017-12-23 DIAGNOSIS — Z79899 Other long term (current) drug therapy: Secondary | ICD-10-CM | POA: Diagnosis not present

## 2017-12-23 DIAGNOSIS — E785 Hyperlipidemia, unspecified: Secondary | ICD-10-CM | POA: Diagnosis present

## 2017-12-23 DIAGNOSIS — Z7982 Long term (current) use of aspirin: Secondary | ICD-10-CM

## 2017-12-23 DIAGNOSIS — Z87891 Personal history of nicotine dependence: Secondary | ICD-10-CM

## 2017-12-23 DIAGNOSIS — F1010 Alcohol abuse, uncomplicated: Secondary | ICD-10-CM | POA: Diagnosis present

## 2017-12-23 DIAGNOSIS — D6959 Other secondary thrombocytopenia: Secondary | ICD-10-CM | POA: Diagnosis present

## 2017-12-23 DIAGNOSIS — G894 Chronic pain syndrome: Secondary | ICD-10-CM | POA: Diagnosis present

## 2017-12-23 DIAGNOSIS — E039 Hypothyroidism, unspecified: Secondary | ICD-10-CM | POA: Diagnosis not present

## 2017-12-23 DIAGNOSIS — I739 Peripheral vascular disease, unspecified: Secondary | ICD-10-CM | POA: Diagnosis not present

## 2017-12-23 DIAGNOSIS — Z8501 Personal history of malignant neoplasm of esophagus: Secondary | ICD-10-CM | POA: Diagnosis not present

## 2017-12-23 DIAGNOSIS — T45515A Adverse effect of anticoagulants, initial encounter: Secondary | ICD-10-CM | POA: Diagnosis not present

## 2017-12-23 DIAGNOSIS — D6832 Hemorrhagic disorder due to extrinsic circulating anticoagulants: Secondary | ICD-10-CM | POA: Diagnosis present

## 2017-12-23 DIAGNOSIS — K068 Other specified disorders of gingiva and edentulous alveolar ridge: Secondary | ICD-10-CM

## 2017-12-23 DIAGNOSIS — R791 Abnormal coagulation profile: Secondary | ICD-10-CM | POA: Diagnosis not present

## 2017-12-23 DIAGNOSIS — F419 Anxiety disorder, unspecified: Secondary | ICD-10-CM | POA: Diagnosis present

## 2017-12-23 DIAGNOSIS — Z955 Presence of coronary angioplasty implant and graft: Secondary | ICD-10-CM

## 2017-12-23 DIAGNOSIS — D696 Thrombocytopenia, unspecified: Secondary | ICD-10-CM | POA: Diagnosis not present

## 2017-12-23 DIAGNOSIS — I10 Essential (primary) hypertension: Secondary | ICD-10-CM | POA: Diagnosis present

## 2017-12-23 DIAGNOSIS — Z79890 Hormone replacement therapy (postmenopausal): Secondary | ICD-10-CM | POA: Diagnosis not present

## 2017-12-23 DIAGNOSIS — Z95820 Peripheral vascular angioplasty status with implants and grafts: Secondary | ICD-10-CM | POA: Diagnosis not present

## 2017-12-23 DIAGNOSIS — I70209 Unspecified atherosclerosis of native arteries of extremities, unspecified extremity: Secondary | ICD-10-CM | POA: Diagnosis not present

## 2017-12-23 HISTORY — DX: Malignant (primary) neoplasm, unspecified: C80.1

## 2017-12-23 HISTORY — DX: Malignant neoplasm of esophagus, unspecified: C15.9

## 2017-12-23 LAB — URINALYSIS, ROUTINE W REFLEX MICROSCOPIC
Bacteria, UA: NONE SEEN
Bilirubin Urine: NEGATIVE
Glucose, UA: NEGATIVE mg/dL
Ketones, ur: NEGATIVE mg/dL
Leukocytes, UA: NEGATIVE
Nitrite: NEGATIVE
Protein, ur: NEGATIVE mg/dL
Specific Gravity, Urine: 1.002 — ABNORMAL LOW (ref 1.005–1.030)
pH: 7 (ref 5.0–8.0)

## 2017-12-23 LAB — BASIC METABOLIC PANEL
Anion gap: 9 (ref 5–15)
BUN: 9 mg/dL (ref 6–20)
CO2: 27 mmol/L (ref 22–32)
Calcium: 9.2 mg/dL (ref 8.9–10.3)
Chloride: 83 mmol/L — ABNORMAL LOW (ref 98–111)
Creatinine, Ser: 0.87 mg/dL (ref 0.61–1.24)
GFR calc Af Amer: >60 mL/min (ref >60)
GFR calc non Af Amer: >60 mL/min (ref >60)
Glucose, Bld: 122 mg/dL — ABNORMAL HIGH (ref 70–99)
Potassium: 5 mmol/L (ref 3.5–5.1)
Sodium: 119 mmol/L — CL (ref 135–145)

## 2017-12-23 LAB — CBC WITH DIFFERENTIAL/PLATELET
Basophils Absolute: 0 10*3/uL (ref 0.0–0.1)
Basophils Relative: 0 %
Eosinophils Absolute: 0.1 10*3/uL (ref 0.0–0.7)
Eosinophils Relative: 1 %
HCT: 36.7 % — ABNORMAL LOW (ref 39.0–52.0)
Hemoglobin: 12.9 g/dL — ABNORMAL LOW (ref 13.0–17.0)
Lymphocytes Relative: 32 %
Lymphs Abs: 1.4 10*3/uL (ref 0.7–4.0)
MCH: 33.2 pg (ref 26.0–34.0)
MCHC: 35.1 g/dL (ref 30.0–36.0)
MCV: 94.3 fL (ref 78.0–100.0)
Monocytes Absolute: 0.4 10*3/uL (ref 0.1–1.0)
Monocytes Relative: 10 %
Neutro Abs: 2.4 10*3/uL (ref 1.7–7.7)
Neutrophils Relative %: 57 %
Platelets: 142 10*3/uL — ABNORMAL LOW (ref 150–400)
RBC: 3.89 MIL/uL — ABNORMAL LOW (ref 4.22–5.81)
RDW: 12.9 % (ref 11.5–15.5)
WBC: 4.3 10*3/uL (ref 4.0–10.5)

## 2017-12-23 LAB — PROTIME-INR
INR: 4.25
Prothrombin Time: 40.5 seconds — ABNORMAL HIGH (ref 11.4–15.2)

## 2017-12-23 LAB — HEPATIC FUNCTION PANEL
ALT: 59 U/L — ABNORMAL HIGH (ref 0–44)
AST: 107 U/L — ABNORMAL HIGH (ref 15–41)
Albumin: 3.8 g/dL (ref 3.5–5.0)
Alkaline Phosphatase: 99 U/L (ref 38–126)
Bilirubin, Direct: 0.3 mg/dL — ABNORMAL HIGH (ref 0.0–0.2)
Indirect Bilirubin: 0.9 mg/dL (ref 0.3–0.9)
Total Bilirubin: 1.2 mg/dL (ref 0.3–1.2)
Total Protein: 7.5 g/dL (ref 6.5–8.1)

## 2017-12-23 LAB — TSH: TSH: 2.543 u[IU]/mL (ref 0.350–4.500)

## 2017-12-23 LAB — CREATININE, URINE, RANDOM: Creatinine, Urine: 29.21 mg/dL

## 2017-12-23 LAB — NA AND K (SODIUM & POTASSIUM), RAND UR
Potassium Urine: 15 mmol/L
Sodium, Ur: <10 mmol/L

## 2017-12-23 MED ORDER — VITAMIN B-1 100 MG PO TABS
100.0000 mg | ORAL_TABLET | Freq: Every day | ORAL | Status: DC
  Administered 2017-12-24 – 2017-12-25 (×2): 100 mg via ORAL
  Filled 2017-12-23 (×2): qty 1

## 2017-12-23 MED ORDER — VITAMIN D 1000 UNITS PO TABS
2000.0000 [IU] | ORAL_TABLET | Freq: Every day | ORAL | Status: DC
  Administered 2017-12-24 – 2017-12-25 (×2): 2000 [IU] via ORAL
  Filled 2017-12-23 (×2): qty 2

## 2017-12-23 MED ORDER — ONDANSETRON HCL 4 MG/2ML IJ SOLN: 4.0000 mg | Freq: Four times a day (QID) | INTRAMUSCULAR | Status: DC | PRN

## 2017-12-23 MED ORDER — ALPRAZOLAM 1 MG PO TABS
1.0000 mg | ORAL_TABLET | Freq: Every evening | ORAL | Status: DC | PRN
  Administered 2017-12-24 (×2): 1 mg via ORAL
  Filled 2017-12-23 (×4): qty 1

## 2017-12-23 MED ORDER — VITAMIN K1 1 MG/0.5ML IJ SOLN
INTRAMUSCULAR | Status: AC
  Filled 2017-12-23: qty 0.5

## 2017-12-23 MED ORDER — VITAMIN K1 1 MG/0.5ML IJ SOLN
1.0000 mg | Freq: Once | INTRAVENOUS | Status: AC
  Administered 2017-12-23: 1 mg via INTRAVENOUS
  Filled 2017-12-23: qty 0.1

## 2017-12-23 MED ORDER — PROPRANOLOL HCL 20 MG PO TABS
20.0000 mg | ORAL_TABLET | Freq: Two times a day (BID) | ORAL | Status: DC
  Administered 2017-12-23 – 2017-12-25 (×4): 20 mg via ORAL
  Filled 2017-12-23 (×4): qty 1

## 2017-12-23 MED ORDER — ALPRAZOLAM 0.5 MG PO TABS
1.0000 mg | ORAL_TABLET | Freq: Once | ORAL | Status: AC
  Administered 2017-12-23: 1 mg via ORAL
  Filled 2017-12-23: qty 2

## 2017-12-23 MED ORDER — THIAMINE HCL 100 MG/ML IJ SOLN: 100.0000 mg | Freq: Every day | INTRAMUSCULAR | Status: DC

## 2017-12-23 MED ORDER — LEVOTHYROXINE SODIUM 50 MCG PO TABS
50.0000 ug | ORAL_TABLET | Freq: Every day | ORAL | Status: DC
  Administered 2017-12-24 – 2017-12-25 (×2): 50 ug via ORAL
  Filled 2017-12-23 (×2): qty 1

## 2017-12-23 MED ORDER — ADULT MULTIVITAMIN W/MINERALS CH
1.0000 | ORAL_TABLET | Freq: Every day | ORAL | Status: DC
  Administered 2017-12-24 – 2017-12-25 (×2): 1 via ORAL
  Filled 2017-12-23 (×2): qty 1

## 2017-12-23 MED ORDER — SODIUM CHLORIDE 0.9 % IV SOLN
INTRAVENOUS | Status: DC
  Administered 2017-12-24: via INTRAVENOUS

## 2017-12-23 MED ORDER — LORAZEPAM 1 MG PO TABS
1.0000 mg | ORAL_TABLET | Freq: Four times a day (QID) | ORAL | Status: DC | PRN
  Administered 2017-12-24: 1 mg via ORAL
  Filled 2017-12-23 (×2): qty 1

## 2017-12-23 MED ORDER — LOSARTAN POTASSIUM 50 MG PO TABS
25.0000 mg | ORAL_TABLET | Freq: Every day | ORAL | Status: DC
  Administered 2017-12-24 – 2017-12-25 (×2): 25 mg via ORAL
  Filled 2017-12-23 (×2): qty 1

## 2017-12-23 MED ORDER — LORAZEPAM 2 MG/ML IJ SOLN
1.0000 mg | Freq: Four times a day (QID) | INTRAMUSCULAR | Status: DC | PRN
  Filled 2017-12-23: qty 1

## 2017-12-23 MED ORDER — OXYCODONE HCL 5 MG PO TABS
20.0000 mg | ORAL_TABLET | ORAL | Status: DC | PRN
  Administered 2017-12-23 – 2017-12-25 (×7): 20 mg via ORAL
  Filled 2017-12-23 (×7): qty 4

## 2017-12-23 MED ORDER — FOLIC ACID 1 MG PO TABS
1.0000 mg | ORAL_TABLET | Freq: Every day | ORAL | Status: DC
  Administered 2017-12-24: 1 mg via ORAL

## 2017-12-23 MED ORDER — ALBUTEROL SULFATE (2.5 MG/3ML) 0.083% IN NEBU
3.0000 mL | INHALATION_SOLUTION | RESPIRATORY_TRACT | Status: DC | PRN
Start: 2017-12-23 — End: 2017-12-25
  Administered 2017-12-24: 3 mL via RESPIRATORY_TRACT
  Filled 2017-12-23: qty 3

## 2017-12-23 MED ORDER — ONDANSETRON HCL 4 MG PO TABS: 4.0000 mg | ORAL_TABLET | Freq: Four times a day (QID) | ORAL | Status: DC | PRN

## 2017-12-23 MED ORDER — FOLIC ACID 1 MG PO TABS
1.0000 mg | ORAL_TABLET | Freq: Every day | ORAL | Status: DC
  Administered 2017-12-24 – 2017-12-25 (×2): 1 mg via ORAL
  Filled 2017-12-23 (×2): qty 1

## 2017-12-23 NOTE — ED Notes (Addendum)
Date and time results received: 12/23/17 @20 :24   Test: INR 4.25 Critical Value: INR 4.25  Name of Provider Notified: Dr Wilson Singer  Orders Received? Or Actions Taken?:

## 2017-12-23 NOTE — H&P (Signed)
TRH H&P    Patient Demographics:    Jimmy Tucker, is a 57 y.o. male  MRN: [id number]  DOB - 10/26/1960  Admit Date - 12/23/2017  Referring MD/NP/PA: Dr Wilson Singer  Outpatient Primary MD for the patient is [name], MD  Patient coming from: Home   Chief complaint- Bleeding gums   HPI:    Jimmy Tucker  is a 57 y.o. male,With history of esophageal cancer status post surgery at [facility] in 2016, alcohol abuse, hypothyroidism, coronary artery disease, tobacco abuse, peripheral arterial disease, atrial fibrillation who was recently started on Coumadin by his PCP for atrial fibrillation. Patient had recent INR checked at PCP office and was found to have elevated INR, also patient started having nonbleeding so he was recommended to come to the ED for further evaluation. In the ED lab work showed INR 4.30, patient complains of dark stools but no black tarry stool No nausea vomiting or diarrhea. No abdominal pain. Patient has not taken Coumadin since Friday No previous history of stroke or seizures. Lab work also revealed a sodium of 119, patient drinks 6-10 beers every day    Review of systems:      All other systems reviewed and are negative.   With Past History of the following :    Past Medical History:  Diagnosis Date  . Abnormal nuclear stress test   . Anxiety   . Cancer ([place])   . Carotid artery disease ([place])   . Chronic back pain   . Chronic lower back pain    "L4-5"  . Esophageal cancer ([place])   . Heavy cigarette smoker (20-39 per day)    Unmotivated  . Hyperlipidemia LDL goal <70    Intolerant to statins  . Hypertension   . Lumbar disc disease   . PAD (peripheral artery disease) ([place]) 10/30/2012   a) 2011 LEA Dopplers: Left ATA occlusion; b) 10/2013: LEA Dopplers - L Common Iliac & CFA occlusion - w/ short reconstitution at the branch point of the external and internal iliac, the external  iliac is occluded through the common femoral and proximal SFA; reconstitutes in the proximal SFA. Two-vessel runoff beyond.; c) s/p LCIA Stent & CFA EA w/ patch angioplasty --> f/u doppler 12/01/2013 patent  . Pollen allergies   . Presence of stent in artery       Past Surgical History:  Procedure Laterality Date  . BACK SURGERY  02/2008  . CARDIAC CATHETERIZATION N/A 02/15/2015   Procedure: Left Heart Cath and Coronary Angiography;  Surgeon: Lorretta Harp, MD;  Location: [place] CV LAB;  Service: Cardiovascular;  Laterality: N/A;  . COLONOSCOPY  07/24/2011   Procedure: COLONOSCOPY;  Surgeon: Daneil Dolin, MD;  [facility];  Service: Endoscopy;  Laterality: N/A;  8:15  . CORONARY STENT PLACEMENT    . ENDARTERECTOMY FEMORAL Left 11/11/2013   Procedure: Left Femoral Endarterectomy with Patch Angioplasty ;  Surgeon: Angelia Mould, MD;  Location: [place];  Service: Vascular;  Laterality: Left;  . ESOPHAGOGASTRODUODENOSCOPY N/A 10/09/2014   Procedure:  ESOPHAGOGASTRODUODENOSCOPY (EGD);  Surgeon: Rogene Houston, MD;  [facility];  Service: Endoscopy;  Laterality: N/A;  230 - moved to 9:55 - Ann notified pt to arrive at 8:55  . ILIAC ARTERY STENT Left 11/03/2013   Dr. Judithann Sauger  . LOWER EXTREMITY ANGIOGRAM N/A 11/03/2013   Procedure: LOWER EXTREMITY ANGIOGRAM;  Surgeon: Lorretta Harp, MD;  Location: [facility] CATH LAB;  Service: Cardiovascular;  Laterality: N/A;  . LUMBAR MICRODISCECTOMY Left 02/2008   L4-5  . NM MYOCAR PERF WALL MOTION  07/2009   persantine - normal perfusion  . SURGERY SCROTAL / TESTICULAR Left    cyst excision  . THORACIC ESOPHAGUS REPLACEMENT        Social History:      Social History   Tobacco Use  . Smoking status: Former Smoker    Packs/day: 1.00    Years: 40.00    Pack years: 40.00    Types: Cigarettes    Last attempt to quit: 11/02/2013    Years since quitting: 4.1  . Smokeless tobacco: Never Used  Substance Use Topics  . Alcohol  use: Yes    Alcohol/week: 12.6 oz    Types: 21 Cans of beer per week    Comment: every day        Family History :     Family History  Problem Relation Age of Onset  . Heart attack Father 32       Cardiac arrest  . Sudden death Father   . Stroke Brother 44  . Stroke Mother   . Colon cancer Neg Hx       Home Medications:   Prior to Admission medications   Medication Sig Start Date End Date Taking? Authorizing Provider  albuterol (PROAIR HFA) 108 (90 BASE) MCG/ACT inhaler Inhale 2 puffs into the lungs every 4 (four) hours as needed for wheezing or shortness of breath.    Yes [provider]  ALPRAZolam Duanne Moron) 1 MG tablet Take 1 mg by mouth at bedtime as needed for anxiety.  05/22/11  Yes [provider]  aspirin EC 81 MG tablet Take 81 mg by mouth daily.   Yes [provider]  Cholecalciferol (VITAMIN D3) 2000 units capsule Take 1 capsule by mouth daily.   Yes [provider]  folic acid (FOLVITE) 1 MG tablet Take 1 tablet (1 mg total) by mouth daily. 05/01/14  Yes Leonie Man, MD  levothyroxine (SYNTHROID, LEVOTHROID) 25 MCG tablet Take 2 tablets by mouth daily.   Yes [provider]  losartan (COZAAR) 50 MG tablet Take 0.5 tablets (25 mg total) by mouth daily. 11/04/13  Yes Brett Canales, PA-C  Oxycodone HCl 20 MG TABS Take 1 tablet (20 mg total) by mouth every 4 (four) hours as needed (pain). 11/11/13  Yes Rhyne, Hulen Shouts, PA-C  propranolol (INDERAL) 20 MG tablet Take 20 mg by mouth 2 (two) times daily.    Yes [provider]  diphenoxylate-atropine (LOMOTIL) 2.5-0.025 MG tablet Take 1 tablet by mouth every 6 (six) hours as needed.    [provider]     Allergies:     Allergies  Allergen Reactions  . Glycopyrrolate Other (See Comments)    Intolerance     Physical Exam:   Vitals  Blood pressure (!) 103/57, pulse 61, temperature (!) 97.5 F (36.4 C), temperature source Temporal, resp. rate (!) 24,  height 5\' 6"  (1.676 m), weight 74.8 kg (165 lb), SpO2 100 %.  1.  General:  Appears in no acute distress  2. Psychiatric:  Intact judgement and  insight, awake alert, oriented x 3.  3. Neurologic: No focal neurological deficits, all cranial nerves intact.Strength 5/5 all 4 extremities, sensation intact all 4 extremities, plantars down going.  4. Eyes :  anicteric sclerae, moist conjunctivae with no lid lag. PERRLA.  5. ENMT:  Blood noted on the gums, teeth  6. Neck:  supple, no cervical lymphadenopathy appriciated, No thyromegaly  7. Respiratory : Normal respiratory effort, good air movement bilaterally,clear to  auscultation bilaterally  8. Cardiovascular : RRR, no gallops, rubs or murmurs, no leg edema  9. Gastrointestinal:  Positive bowel sounds, abdomen soft, non-tender to palpation,no hepatosplenomegaly, no rigidity or guarding       10. Skin:  No cyanosis, normal texture and turgor, no rash, lesions or ulcers  11.Musculoskeletal:  Good muscle tone,  joints appear normal , no effusions,  normal range of motion    Data Review:    CBC Recent Labs  Lab 12/23/17 1946  WBC 4.3  HGB 12.9*  HCT 36.7*  PLT 142*  MCV 94.3  MCH 33.2  MCHC 35.1  RDW 12.9  LYMPHSABS 1.4  MONOABS 0.4  EOSABS 0.1  BASOSABS 0.0   ------------------------------------------------------------------------------------------------------------------  Chemistries  Recent Labs  Lab 12/23/17 1946  NA 119*  K 5.0  CL 83*  CO2 27  GLUCOSE 122*  BUN 9  CREATININE 0.87  CALCIUM 9.2  AST 107*  ALT 59*  ALKPHOS 99  BILITOT 1.2   ------------------------------------------------------------------------------------------------------------------  ------------------------------------------------------------------------------------------------------------------ GFR: Estimated Creatinine Clearance: 84.5 mL/min (by C-G formula based on SCr of 0.87 mg/dL). Liver Function Tests: Recent Labs   Lab 12/23/17 1946  AST 107*  ALT 59*  ALKPHOS 99  BILITOT 1.2  PROT 7.5  ALBUMIN 3.8   No results for input(s): LIPASE, AMYLASE in the last 168 hours. No results for input(s): AMMONIA in the last 168 hours. Coagulation Profile: Recent Labs  Lab 12/23/17 1946  INR 4.25*    --------------------------------------------------------------------------------------------------------------- Urine analysis:    Component Value Date/Time   COLORURINE YELLOW 12/23/2017 2120   APPEARANCEUR CLEAR 12/23/2017 2120   LABSPEC 1.002 (L) 12/23/2017 2120   PHURINE 7.0 12/23/2017 2120   GLUCOSEU NEGATIVE 12/23/2017 2120   HGBUR SMALL (A) 12/23/2017 2120   BILIRUBINUR NEGATIVE 12/23/2017 2120   [place] NEGATIVE 12/23/2017 2120   PROTEINUR NEGATIVE 12/23/2017 2120   UROBILINOGEN 1.0 11/11/2013 0618   NITRITE NEGATIVE 12/23/2017 2120   LEUKOCYTESUR NEGATIVE 12/23/2017 2120      Imaging Results:    No results found.  My personal review of EKG: Rhythm NSR   Assessment & Plan:    Active Problems:   Essential hypertension   Hyponatremia   Bleeding gums   1. Hyponatremia-likely beer potomania, check urine osmolality, serum osmolality, urine sodium.  Keep n.p.o., and start fluid restriction in a.m. once patient able to take by mouth.  Check serum cortisol, TSH 2. Bleeding gums-likely from elevated INR.  Will hold Coumadin, give 1 dose of vitamin K 1 mg IV.  Follow PT/INR in a.m.  Currently INR is 4.30. 3. Hypertension-continue Cozaar, propranolol 4. Hypothyroidism-continue Synthroid 5. Chronic pain syndrome-continue oxycodone as needed   DVT Prophylaxis-   SCDs   AM Labs Ordered, also please review Full Orders  Family Communication: Admission, patients condition and plan of care including tests being ordered have been discussed with the patient and his wife at bedside who indicate understanding and agree with the plan and Code Status.  Code Status:  Full code  Admission status:  Inpatient    Time spent in minutes : 60 min   Oswald Hillock M.D on 12/23/2017 at 10:23 PM  Between 7am to 7pm - Pager - [phone]. After 7pm go to [email or web address] - password [facility]  [facility] - Office  [phone]

## 2017-12-23 NOTE — ED Provider Notes (Signed)
[facility] EMERGENCY DEPARTMENT Provider Note   CSN: [id number] Arrival date & time: 12/23/17  1905     History   Chief Complaint Chief Complaint  Patient presents with  . Coagulation Disorder    HPI Jimmy Tucker is a 57 y.o. male.  HPI   57 year old male with gingival bleeding.  Onset today.  Persistent.  Denies any dizziness or lightheadedness.  No overt bleeding elsewhere.  No melena.  Is on Coumadin for history of atrial fibrillation.  No dizziness or lightheadedness.  Past Medical History:  Diagnosis Date  . Abnormal nuclear stress test   . Anxiety   . Cancer ([place])   . Carotid artery disease ([place])   . Chronic back pain   . Chronic lower back pain    "L4-5"  . Esophageal cancer ([place])   . Heavy cigarette smoker (20-39 per day)    Unmotivated  . Hyperlipidemia LDL goal <70    Intolerant to statins  . Hypertension   . Lumbar disc disease   . PAD (peripheral artery disease) ([place]) 10/30/2012   a) 2011 LEA Dopplers: Left ATA occlusion; b) 10/2013: LEA Dopplers - L Common Iliac & CFA occlusion - w/ short reconstitution at the branch point of the external and internal iliac, the external iliac is occluded through the common femoral and proximal SFA; reconstitutes in the proximal SFA. Two-vessel runoff beyond.; c) s/p LCIA Stent & CFA EA w/ patch angioplasty --> f/u doppler 12/01/2013 patent  . Pollen allergies   . Presence of stent in artery     Patient Active Problem List   Diagnosis Date Noted  . Abnormal nuclear stress test 02/09/2015  . PVD (peripheral vascular disease) ([place]) 11/27/2013  . PAD (peripheral artery disease) ([place]) 11/11/2013  . Critical lower limb ischemia - poorly healing ulcer on left foot with occluded iliac, femoral artery on left 10/30/2013  . Other malaise and fatigue 10/30/2013  . Essential hypertension   . Heavy cigarette smoker (20-39 per day)   . Hyperlipidemia LDL goal <70   . Screening for colon cancer 07/07/2011    Past Surgical  History:  Procedure Laterality Date  . BACK SURGERY  02/2008  . CARDIAC CATHETERIZATION N/A 02/15/2015   Procedure: Left Heart Cath and Coronary Angiography;  Surgeon: Lorretta Harp, MD;  Location: [facility];  Service: Cardiovascular;  Laterality: N/A;  . COLONOSCOPY  07/24/2011   Procedure: COLONOSCOPY;  Surgeon: Daneil Dolin, MD;  [facility];  Service: Endoscopy;  Laterality: N/A;  8:15  . CORONARY STENT PLACEMENT    . ENDARTERECTOMY FEMORAL Left 11/11/2013   Procedure: Left Femoral Endarterectomy with Patch Angioplasty ;  Surgeon: Angelia Mould, MD;  Location: [facility];  Service: Vascular;  Laterality: Left;  . ESOPHAGOGASTRODUODENOSCOPY N/A 10/09/2014   Procedure: ESOPHAGOGASTRODUODENOSCOPY (EGD);  Surgeon: Rogene Houston, MD;  Location[facility];  Service: Endoscopy;  Laterality: N/A;  230 - moved to 9:55 - Ann notified pt to arrive at 8:55  . ILIAC ARTERY STENT Left 11/03/2013   Dr. Judithann Sauger  . LOWER EXTREMITY ANGIOGRAM N/A 11/03/2013   Procedure: LOWER EXTREMITY ANGIOGRAM;  Surgeon: Lorretta Harp, MD;  Location: [facility] CATH LAB;  Service: Cardiovascular;  Laterality: N/A;  . LUMBAR MICRODISCECTOMY Left 02/2008   L4-5  . NM MYOCAR PERF WALL MOTION  07/2009   persantine - normal perfusion  . SURGERY SCROTAL / TESTICULAR Left    cyst excision  . THORACIC ESOPHAGUS REPLACEMENT  Home Medications    Prior to Admission medications   Medication Sig Start Date End Date Taking? Authorizing Provider  albuterol (PROAIR HFA) 108 (90 BASE) MCG/ACT inhaler Inhale 2 puffs into the lungs every 4 (four) hours as needed for wheezing or shortness of breath.    Yes [provider]  ALPRAZolam Duanne Moron) 1 MG tablet Take 1 mg by mouth at bedtime as needed for anxiety.  05/22/11  Yes [provider]  aspirin EC 81 MG tablet Take 81 mg by mouth daily.   Yes [provider]  Cholecalciferol (VITAMIN D3) 2000 units capsule Take 1 capsule by  mouth daily.   Yes [provider]  folic acid (FOLVITE) 1 MG tablet Take 1 tablet (1 mg total) by mouth daily. 05/01/14  Yes Leonie Man, MD  levothyroxine (SYNTHROID, LEVOTHROID) 25 MCG tablet Take 2 tablets by mouth daily.   Yes [provider]  losartan (COZAAR) 50 MG tablet Take 0.5 tablets (25 mg total) by mouth daily. 11/04/13  Yes Brett Canales, PA-C  Oxycodone HCl 20 MG TABS Take 1 tablet (20 mg total) by mouth every 4 (four) hours as needed (pain). 11/11/13  Yes Rhyne, Hulen Shouts, PA-C  propranolol (INDERAL) 20 MG tablet Take 20 mg by mouth 2 (two) times daily.    Yes [provider]  diphenoxylate-atropine (LOMOTIL) 2.5-0.025 MG tablet Take 1 tablet by mouth every 6 (six) hours as needed.    [provider]    Family History Family History  Problem Relation Age of Onset  . Heart attack Father 5       Cardiac arrest  . Sudden death Father   . Stroke Brother 72  . Stroke Mother   . Colon cancer Neg Hx     Social History Social History   Tobacco Use  . Smoking status: Former Smoker    Packs/day: 1.00    Years: 40.00    Pack years: 40.00    Types: Cigarettes    Last attempt to quit: 11/02/2013    Years since quitting: 4.1  . Smokeless tobacco: Never Used  Substance Use Topics  . Alcohol use: Yes    Alcohol/week: 12.6 oz    Types: 21 Cans of beer per week    Comment: every day   . Drug use: No     Allergies   Glycopyrrolate   Review of Systems Review of Systems  All systems reviewed and negative, other than as noted in HPI.  Physical Exam Updated Vital Signs BP 134/74   Pulse (!) 56   Temp (!) 97.5 F (36.4 C) (Temporal)   Resp (!) 22   Ht 5\' 6"  (1.676 m)   Wt 74.8 kg (165 lb)   SpO2 99%   BMI 26.63 kg/m   Physical Exam  Constitutional: He appears well-developed and well-nourished. No distress.  HENT:  Head: Normocephalic.  oozing from gingiva left lower teeth.  Generally poor dentition.  Eyes:  Conjunctivae are normal. Right eye exhibits no discharge. Left eye exhibits no discharge.  Neck: Neck supple.  Cardiovascular: Normal rate, regular rhythm and normal heart sounds. Exam reveals no gallop and no friction rub.  No murmur heard. Pulmonary/Chest: Effort normal and breath sounds normal. No respiratory distress.  Abdominal: Soft. He exhibits no distension. There is no tenderness.  Musculoskeletal: He exhibits no edema or tenderness.  Neurological: He is alert.  Skin: Skin is warm and dry.  Psychiatric: He has a normal mood and affect. His behavior  is normal. Thought content normal.  Nursing note and vitals reviewed.    ED Treatments / Results  Labs (all labs ordered are listed, but only abnormal results are displayed) Labs Reviewed  PROTIME-INR - Abnormal; Notable for the following components:      Result Value   Prothrombin Time 40.5 (*)    INR 4.25 (*)    All other components within normal limits  CBC WITH DIFFERENTIAL/PLATELET - Abnormal; Notable for the following components:   RBC 3.89 (*)    Hemoglobin 12.9 (*)    HCT 36.7 (*)    Platelets 142 (*)    All other components within normal limits  BASIC METABOLIC PANEL - Abnormal; Notable for the following components:   Sodium 119 (*)    Chloride 83 (*)    Glucose, Bld 122 (*)    All other components within normal limits  HEPATIC FUNCTION PANEL  OSMOLALITY, URINE  OSMOLALITY  TSH  CORTISOL  NA AND K (SODIUM & POTASSIUM), RAND UR  CREATININE, URINE, RANDOM  URINALYSIS, ROUTINE W REFLEX MICROSCOPIC    EKG EKG Interpretation  Date/Time:  "Sunday December 23 2017 20:28:55 EDT Ventricular Rate:  59 PR Interval:    QRS Duration: 104 QT Interval:  418 QTC Calculation: 415 R Axis:   62 Text Interpretation:  Sinus rhythm RSR' in V1 or V2, probably normal variant Baseline wander in lead(s) III aVF No significant change since last tracing Confirmed by Jaelyn Bourgoin ([id number]) on 12/23/2017 10:05:11 PM   Radiology No  results found.  Procedures Procedures (including critical care time)  Medications Ordered in ED Medications  ALPRAZolam (XANAX) tablet 1 mg (1 mg Oral Given 12/23/17 2118)     Initial Impression / Assessment and Plan / ED Course  I have reviewed the triage vital signs and the nursing notes.  Pertinent labs & imaging results that were available during my care of the patient were reviewed by me and considered in my medical decision making (see chart for details).     57"  year old male who presented with gingival bleeding.  He is on Coumadin for history of atrial fibrillation.  He does have some gingival oozing from lower teeth.  Generally poor dentition.  INR is a little bit supratherapeutic.  Denies any overt bleeding elsewhere.  No dizziness or lightheadedness.  And to be sniffily hyponatremic with sodium 119.  His mental status is fine though.  Will admit for further evaluation.  Final Clinical Impressions(s) / ED Diagnoses   Final diagnoses:  Hyponatremia  Elevated INR  Gingival bleeding    ED Discharge Orders    None       Virgel Manifold, MD 12/24/17 2315

## 2017-12-23 NOTE — ED Triage Notes (Signed)
Pt states he has not taken coumadin since Friday. Pt states this am his gums started bleeding.

## 2017-12-23 NOTE — ED Notes (Signed)
Date and time results received: 12/23/17 @20 ;19   Test: sodium Critical Value: 119  Name of Provider Notified: Dr Wilson Singer Orders Received? Or Actions Taken?: no additional orders at present time,

## 2017-12-24 DIAGNOSIS — E785 Hyperlipidemia, unspecified: Secondary | ICD-10-CM

## 2017-12-24 DIAGNOSIS — I4891 Unspecified atrial fibrillation: Secondary | ICD-10-CM | POA: Diagnosis present

## 2017-12-24 DIAGNOSIS — K068 Other specified disorders of gingiva and edentulous alveolar ridge: Secondary | ICD-10-CM

## 2017-12-24 DIAGNOSIS — I739 Peripheral vascular disease, unspecified: Secondary | ICD-10-CM

## 2017-12-24 DIAGNOSIS — I10 Essential (primary) hypertension: Secondary | ICD-10-CM

## 2017-12-24 DIAGNOSIS — D696 Thrombocytopenia, unspecified: Secondary | ICD-10-CM | POA: Diagnosis present

## 2017-12-24 DIAGNOSIS — R791 Abnormal coagulation profile: Secondary | ICD-10-CM

## 2017-12-24 DIAGNOSIS — F1010 Alcohol abuse, uncomplicated: Secondary | ICD-10-CM

## 2017-12-24 DIAGNOSIS — E871 Hypo-osmolality and hyponatremia: Principal | ICD-10-CM

## 2017-12-24 DIAGNOSIS — E039 Hypothyroidism, unspecified: Secondary | ICD-10-CM | POA: Diagnosis present

## 2017-12-24 LAB — COMPREHENSIVE METABOLIC PANEL
ALT: 49 U/L — ABNORMAL HIGH (ref 0–44)
ANION GAP: 9 (ref 5–15)
AST: 83 U/L — ABNORMAL HIGH (ref 15–41)
Albumin: 3.4 g/dL — ABNORMAL LOW (ref 3.5–5.0)
Alkaline Phosphatase: 85 U/L (ref 38–126)
BUN: 11 mg/dL (ref 6–20)
CHLORIDE: 86 mmol/L — AB (ref 98–111)
CO2: 28 mmol/L (ref 22–32)
CREATININE: 0.77 mg/dL (ref 0.61–1.24)
Calcium: 9 mg/dL (ref 8.9–10.3)
GFR calc non Af Amer: >60 mL/min (ref >60)
Glucose, Bld: 98 mg/dL (ref 70–99)
POTASSIUM: 4.5 mmol/L (ref 3.5–5.1)
Sodium: 123 mmol/L — ABNORMAL LOW (ref 135–145)
Total Bilirubin: 1.3 mg/dL — ABNORMAL HIGH (ref 0.3–1.2)
Total Protein: 6.8 g/dL (ref 6.5–8.1)

## 2017-12-24 LAB — PROTIME-INR
INR: 2.21
Prothrombin Time: 24.3 seconds — ABNORMAL HIGH (ref 11.4–15.2)

## 2017-12-24 LAB — OSMOLALITY: Osmolality: 253 mOsm/kg — ABNORMAL LOW (ref 275–295)

## 2017-12-24 LAB — CBC
HCT: 34.2 % — ABNORMAL LOW (ref 39.0–52.0)
Hemoglobin: 11.9 g/dL — ABNORMAL LOW (ref 13.0–17.0)
MCH: 33.2 pg (ref 26.0–34.0)
MCHC: 34.8 g/dL (ref 30.0–36.0)
MCV: 95.5 fL (ref 78.0–100.0)
PLATELETS: 119 10*3/uL — AB (ref 150–400)
RBC: 3.58 MIL/uL — ABNORMAL LOW (ref 4.22–5.81)
RDW: 12.9 % (ref 11.5–15.5)
WBC: 3.1 10*3/uL — ABNORMAL LOW (ref 4.0–10.5)

## 2017-12-24 LAB — SODIUM, URINE, RANDOM

## 2017-12-24 LAB — GLUCOSE, CAPILLARY: GLUCOSE-CAPILLARY: 107 mg/dL — AB (ref 70–99)

## 2017-12-24 LAB — CORTISOL: Cortisol, Plasma: 8.4 ug/dL

## 2017-12-24 LAB — OSMOLALITY, URINE: Osmolality, Ur: 101 mOsm/kg — ABNORMAL LOW (ref 300–900)

## 2017-12-24 MED ORDER — LORAZEPAM 2 MG/ML IJ SOLN: 0.0000 mg | Freq: Two times a day (BID) | INTRAMUSCULAR | Status: DC

## 2017-12-24 MED ORDER — ADULT MULTIVITAMIN W/MINERALS CH: 1.0000 | ORAL_TABLET | Freq: Every day | ORAL | Status: DC

## 2017-12-24 MED ORDER — LORAZEPAM 2 MG/ML IJ SOLN
0.0000 mg | Freq: Four times a day (QID) | INTRAMUSCULAR | Status: DC
  Administered 2017-12-24 – 2017-12-25 (×2): 1 mg via INTRAVENOUS
  Administered 2017-12-25: 2 mg via INTRAVENOUS
  Filled 2017-12-24 (×3): qty 1

## 2017-12-24 MED ORDER — VITAMIN B-1 100 MG PO TABS: 100.0000 mg | ORAL_TABLET | Freq: Every day | ORAL | Status: DC

## 2017-12-24 MED ORDER — FOLIC ACID 1 MG PO TABS: 1.0000 mg | ORAL_TABLET | Freq: Every day | ORAL | Status: DC

## 2017-12-24 MED ORDER — LORAZEPAM 2 MG/ML IJ SOLN
1.0000 mg | Freq: Four times a day (QID) | INTRAMUSCULAR | Status: DC | PRN
  Administered 2017-12-24 – 2017-12-25 (×2): 1 mg via INTRAVENOUS
  Filled 2017-12-24: qty 1

## 2017-12-24 MED ORDER — SODIUM CHLORIDE 0.9 % IV SOLN
INTRAVENOUS | Status: DC
  Administered 2017-12-24 – 2017-12-25 (×3): via INTRAVENOUS

## 2017-12-24 MED ORDER — LORAZEPAM 1 MG PO TABS: 1.0000 mg | ORAL_TABLET | Freq: Four times a day (QID) | ORAL | Status: DC | PRN

## 2017-12-24 MED ORDER — THIAMINE HCL 100 MG/ML IJ SOLN: 100.0000 mg | Freq: Every day | INTRAMUSCULAR | Status: DC

## 2017-12-24 NOTE — Care Management (Signed)
Benefits check for Xeralto:  Co-pay for Xarelto 20 mg.is $45.00,for 30 day supply.harmacy  No PA required.  Pharmacies are: [facility].    Pt will be given $10 co-pay card which will be good for a year.

## 2017-12-24 NOTE — Progress Notes (Signed)
PROGRESS NOTE    Jimmy Tucker  OAC:[id number] DOB: 02-05-1961 DOA: 12/23/2017 PCP: [name], MD    Brief Narrative:  57 year old male with a history of esophageal cancer status post surgery in 2016, alcohol abuse, atrial fibrillation was recently started on Coumadin by his primary care physician, presents to the hospital with bleeding gums.  Found to have elevated INR 4.3.  He was also noted to be significantly hyponatremic with a serum sodium of 119.  Describes drinking several beers every day as well as large amounts of free water.  He was admitted for further treatments.   Assessment & Plan:   Active Problems:   Essential hypertension   Hyperlipidemia LDL goal <70   PAD (peripheral artery disease) (HCC)   Hyponatremia   Bleeding gums   Unspecified atrial fibrillation (HCC)   Hypothyroidism   Thrombocytopenia (HCC)   Alcohol abuse   1. Hyponatremia.  Suspect is related to be beer potomania.  Patient describes drinking 5 to 6 cans of beer a day.  He also drinks 5-6 bottles of water a day.  Osmolarity is low at 253 sodium is low at less than 10.  Cortisol noted to be normal range.  TSH normal at 2.5.  Urine osmolarity low at 101.  Will start on normal saline.  Recheck labs in a.m. 2. Bleeding gums.  Suspect is related to supratherapeutic INR.  Coumadin has been discontinued.  INR is trending down.  He did receive 1 dose of vitamin K. 3. Atrial fibrillation.  Suspect paroxysmal.  Currently in sinus rhythm.  ChadsVasc score is 2 for hypertension and vascular disease.  Since he is having difficulty controlling his INR, may benefit from transition to newer agent.  Will need to confirm with pharmacy that this can be crushed since he has difficulty swallowing pills. 4. Hypothyroidism.  Thyroid function tests are normal.  Continue Synthroid 5. Alcohol abuse.  Denies any history of withdrawal.  Continue to monitor on Ativan withdrawal protocol 6. Hyperlipidemia.  Continue  statin 7. Hypertension.  Blood pressure currently stable. 8. Thrombocytopenia.  Suspect related to alcohol use.   DVT prophylaxis: Therapeutic on Coumadin Code Status: Full code Family Communication: *Discussed with wife at the bedside Disposition Plan: Discharge home once improved   Consultants:     Procedures:     Antimicrobials:       Subjective: Feeling better.  Bleeding from gums has resolved.  Objective: Vitals:   12/23/17 2336 12/24/17 0602 12/24/17 1439 12/24/17 2021  BP: 135/75 120/60 (!) 100/55   Pulse: 60 (!) 57 70   Resp: 18 18 18    Temp: 97.7 F (36.5 C) 97.7 F (36.5 C) 98.2 F (36.8 C)   TempSrc: Oral Oral Oral   SpO2: 100% 97% 98% 98%  Weight:      Height:        Intake/Output Summary (Last 24 hours) at 12/24/2017 2051 Last data filed at 12/24/2017 2027 Gross per 24 hour  Intake 818.33 ml  Output 2250 ml  Net -1431.67 ml   Filed Weights   12/23/17 1930 12/23/17 2333  Weight: 74.8 kg (165 lb) 78.1 kg (172 lb 2.9 oz)    Examination:  General exam: Appears calm and comfortable  Respiratory system: Clear to auscultation. Respiratory effort normal. Cardiovascular system: S1 & S2 heard, RRR. No JVD, murmurs, rubs, gallops or clicks. No pedal edema. Gastrointestinal system: Abdomen is nondistended, soft and nontender. No organomegaly or masses felt. Normal bowel sounds heard. Central nervous system: Alert and  oriented. No focal neurological deficits. Extremities: Symmetric 5 x 5 power. Skin: No rashes, lesions or ulcers Psychiatry: Judgement and insight appear normal. Mood & affect appropriate.     Data Reviewed: I have personally reviewed following labs and imaging studies  CBC: Recent Labs  Lab 12/23/17 1946 12/24/17 0506  WBC 4.3 3.1*  NEUTROABS 2.4  --   HGB 12.9* 11.9*  HCT 36.7* 34.2*  MCV 94.3 95.5  PLT 142* 829*   Basic Metabolic Panel: Recent Labs  Lab 12/23/17 1946 12/24/17 0506  NA 119* 123*  K 5.0 4.5  CL 83*  86*  CO2 27 28  GLUCOSE 122* 98  BUN 9 11  CREATININE 0.87 0.77  CALCIUM 9.2 9.0   GFR: Estimated Creatinine Clearance: 100.1 mL/min (by C-G formula based on SCr of 0.77 mg/dL). Liver Function Tests: Recent Labs  Lab 12/23/17 1946 12/24/17 0506  AST 107* 83*  ALT 59* 49*  ALKPHOS 99 85  BILITOT 1.2 1.3*  PROT 7.5 6.8  ALBUMIN 3.8 3.4*   No results for input(s): LIPASE, AMYLASE in the last 168 hours. No results for input(s): AMMONIA in the last 168 hours. Coagulation Profile: Recent Labs  Lab 12/23/17 1946 12/24/17 0506  INR 4.25* 2.21   Cardiac Enzymes: No results for input(s): CKTOTAL, CKMB, CKMBINDEX, TROPONINI in the last 168 hours. BNP (last 3 results) No results for input(s): PROBNP in the last 8760 hours. HbA1C: No results for input(s): HGBA1C in the last 72 hours. CBG: Recent Labs  Lab 12/24/17 1614  GLUCAP 107*   Lipid Profile: No results for input(s): CHOL, HDL, LDLCALC, TRIG, CHOLHDL, LDLDIRECT in the last 72 hours. Thyroid Function Tests: Recent Labs    12/23/17 1946  TSH 2.543   Anemia Panel: No results for input(s): VITAMINB12, FOLATE, FERRITIN, TIBC, IRON, RETICCTPCT in the last 72 hours. Sepsis Labs: No results for input(s): PROCALCITON, LATICACIDVEN in the last 168 hours.  No results found for this or any previous visit (from the past 240 hour(s)).       Radiology Studies: No results found.      Scheduled Meds: . cholecalciferol  2,000 Units Oral Daily  . folic acid  1 mg Oral Daily  . folic acid  1 mg Oral Daily  . levothyroxine  50 mcg Oral QAC breakfast  . LORazepam  0-4 mg Intravenous Q6H   Followed by  . [START ON 12/26/2017] LORazepam  0-4 mg Intravenous Q12H  . losartan  25 mg Oral Daily  . multivitamin with minerals  1 tablet Oral Daily  . propranolol  20 mg Oral BID  . thiamine  100 mg Oral Daily   Or  . thiamine  100 mg Intravenous Daily   Continuous Infusions: . sodium chloride 10 mL/hr at 12/24/17 0600  .  sodium chloride 100 mL/hr at 12/24/17 2027     LOS: 1 day    Time spent: 35 minutes greater than 50% of this time spent in direct contact with patient discussing management of hyponatremia, anticoagulation for atrial fibrillation.  Considering new anticoagulants and risks/benefits when compared to Coumadin.    Kathie Dike, MD [facility] Pager [phone]  If 7PM-7AM, please contact night-coverage [email or web address] Password [facility] 12/24/2017, 8:51 PM

## 2017-12-25 DIAGNOSIS — I480 Paroxysmal atrial fibrillation: Secondary | ICD-10-CM

## 2017-12-25 LAB — COMPREHENSIVE METABOLIC PANEL
ALBUMIN: 3.4 g/dL — AB (ref 3.5–5.0)
ALK PHOS: 88 U/L (ref 38–126)
ALT: 54 U/L — ABNORMAL HIGH (ref 0–44)
ANION GAP: 10 (ref 5–15)
AST: 93 U/L — AB (ref 15–41)
BUN: 13 mg/dL (ref 6–20)
CO2: 23 mmol/L (ref 22–32)
Calcium: 8.8 mg/dL — ABNORMAL LOW (ref 8.9–10.3)
Chloride: 95 mmol/L — ABNORMAL LOW (ref 98–111)
Creatinine, Ser: 0.91 mg/dL (ref 0.61–1.24)
GFR calc Af Amer: >60 mL/min (ref >60)
GFR calc non Af Amer: >60 mL/min (ref >60)
GLUCOSE: 115 mg/dL — AB (ref 70–99)
POTASSIUM: 4.1 mmol/L (ref 3.5–5.1)
SODIUM: 128 mmol/L — AB (ref 135–145)
Total Bilirubin: 0.8 mg/dL (ref 0.3–1.2)
Total Protein: 6.8 g/dL (ref 6.5–8.1)

## 2017-12-25 LAB — CBC
HCT: 33.9 % — ABNORMAL LOW (ref 39.0–52.0)
HEMOGLOBIN: 11.5 g/dL — AB (ref 13.0–17.0)
MCH: 32.6 pg (ref 26.0–34.0)
MCHC: 33.9 g/dL (ref 30.0–36.0)
MCV: 96 fL (ref 78.0–100.0)
PLATELETS: 133 10*3/uL — AB (ref 150–400)
RBC: 3.53 MIL/uL — ABNORMAL LOW (ref 4.22–5.81)
RDW: 13.2 % (ref 11.5–15.5)
WBC: 3.1 10*3/uL — AB (ref 4.0–10.5)

## 2017-12-25 LAB — PROTIME-INR
INR: 1.12
Prothrombin Time: 14.3 seconds (ref 11.4–15.2)

## 2017-12-25 LAB — HIV ANTIBODY (ROUTINE TESTING W REFLEX): HIV Screen 4th Generation wRfx: NONREACTIVE

## 2017-12-25 MED ORDER — RIVAROXABAN 20 MG PO TABS: 20.0000 mg | ORAL_TABLET | Freq: Every day | ORAL | 1 refills | Status: DC

## 2017-12-25 MED ORDER — RIVAROXABAN 20 MG PO TABS: 20.0000 mg | ORAL_TABLET | Freq: Every day | ORAL | Status: DC

## 2017-12-25 MED ORDER — RIVAROXABAN (XARELTO) EDUCATION KIT FOR AFIB PATIENTS
PACK | Freq: Once | Status: DC
  Filled 2017-12-25: qty 1

## 2017-12-25 NOTE — Progress Notes (Signed)
ANTICOAGULATION CONSULT NOTE - Initial Consult  Pharmacy Consult for Xarelto Indication: atrial fibrillation  Allergies  Allergen Reactions  . Glycopyrrolate Other (See Comments)    Intolerance    Patient Measurements: Height: 5\' 6"  (167.6 cm) Weight: 172 lb 2.9 oz (78.1 kg) IBW/kg (Calculated) : 63.8  Vital Signs: Temp: 98 F (36.7 C) (07/02 0614) Temp Source: Oral (07/02 0614) BP: 102/62 (07/02 0842) Pulse Rate: 73 (07/02 0842)  Labs: Recent Labs    12/23/17 1946 12/24/17 0506 12/25/17 0454  HGB 12.9* 11.9* 11.5*  HCT 36.7* 34.2* 33.9*  PLT 142* 119* 133*  LABPROT 40.5* 24.3* 14.3  INR 4.25* 2.21 1.12  CREATININE 0.87 0.77 0.91    Estimated Creatinine Clearance: 88 mL/min (by C-G formula based on SCr of 0.91 mg/dL).   Medical History: Past Medical History:  Diagnosis Date  . Abnormal nuclear stress test   . Anxiety   . Cancer ([place])   . Carotid artery disease ([place])   . Chronic back pain   . Chronic lower back pain    "L4-5"  . Esophageal cancer ([place])   . Heavy cigarette smoker (20-39 per day)    Unmotivated  . Hyperlipidemia LDL goal <70    Intolerant to statins  . Hypertension   . Lumbar disc disease   . PAD (peripheral artery disease) ([place]) 10/30/2012   a) 2011 LEA Dopplers: Left ATA occlusion; b) 10/2013: LEA Dopplers - L Common Iliac & CFA occlusion - w/ short reconstitution at the branch point of the external and internal iliac, the external iliac is occluded through the common femoral and proximal SFA; reconstitutes in the proximal SFA. Two-vessel runoff beyond.; c) s/p LCIA Stent & CFA EA w/ patch angioplasty --> f/u doppler 12/01/2013 patent  . Pollen allergies   . Presence of stent in artery     Medications:  Facility-Administered Medications Prior to Admission  Medication Dose Route Frequency Provider Last Rate Last Dose  . promethazine (PHENERGAN) injection 25 mg  25 mg Intravenous Once Daneil Dolin, MD       Medications Prior to Admission   Medication Sig Dispense Refill Last Dose  . albuterol (PROAIR HFA) 108 (90 BASE) MCG/ACT inhaler Inhale 2 puffs into the lungs every 4 (four) hours as needed for wheezing or shortness of breath.    12/23/2017 at Unknown time  . ALPRAZolam (XANAX) 1 MG tablet Take 1 mg by mouth at bedtime as needed for anxiety.    12/23/2017 at Unknown time  . aspirin EC 81 MG tablet Take 81 mg by mouth daily.   Past Week at Unknown time  . Cholecalciferol (VITAMIN D3) 2000 units capsule Take 1 capsule by mouth daily.   Past Week at Unknown time  . folic acid (FOLVITE) 1 MG tablet Take 1 tablet (1 mg total) by mouth daily. 30 tablet 5 12/23/2017 at Unknown time  . levothyroxine (SYNTHROID, LEVOTHROID) 25 MCG tablet Take 2 tablets by mouth daily.   12/23/2017 at Unknown time  . losartan (COZAAR) 50 MG tablet Take 0.5 tablets (25 mg total) by mouth daily. 30 tablet 5 12/23/2017 at Unknown time  . Oxycodone HCl 20 MG TABS Take 1 tablet (20 mg total) by mouth every 4 (four) hours as needed (pain). 30 tablet 0 12/23/2017 at Unknown time  . propranolol (INDERAL) 20 MG tablet Take 20 mg by mouth 2 (two) times daily.    12/23/2017 at Unknown time  . diphenoxylate-atropine (LOMOTIL) 2.5-0.025 MG tablet Take 1 tablet by mouth every  6 (six) hours as needed.   unknown    Assessment: 57 year old male with a history of esophageal cancer status post surgery in 2016, alcohol abuse, atrial fibrillation was recently started on Coumadin by his primary care physician, presents to the hospital with bleeding gums.  Found to have elevated INR 4.3. Coumadin was held and now INR is normalized. Plan to transition patient from Coumadin to Xarelto. Insurance will cover.  Goal of Therapy:  Monitor platelets by anticoagulation protocol: Yes   Plan:  Xarelto 20mg  daily Monitor for S/S of bleeding Educate on [place], BS Pharm D, [place] Pharmacist Pager [phone] 12/25/2017,10:42 AM

## 2017-12-25 NOTE — Discharge Summary (Signed)
Physician Discharge Summary  Jimmy Tucker KWI:[id number] DOB: Oct 26, 1960 DOA: 12/23/2017  PCP: [name], MD  Admit date: 12/23/2017 Discharge date: 12/25/2017  Admitted From: Home Disposition: Home  Recommendations for Outpatient Follow-up:  1. Follow up with PCP in 1-2 weeks 2. Please obtain BMP/CBC in one week 3. Coumadin discontinued and he is been started on Xarelto.  Follow-up scheduled with his cardiologist, Dr. [name].  Discharge Condition: Stable CODE STATUS: Full code Diet recommendation: Heart healthy  Brief/Interim Summary: 57 year old male with history of esophageal cancer status post surgery 2016, alcohol abuse, atrial fibrillation who was recently started on Coumadin by his primary care physician, presented to the hospital with complaints of bleeding gums.  He was found to have an elevated INR of 4.3.  He was also noted to be significantly hyponatremic with a serum sodium of 119.  He admits to drinking several beers, between 6 to 8/day as well as large amounts of free water.  He was admitted for further treatments.  Discharge Diagnoses:  Active Problems:   Essential hypertension   Hyperlipidemia LDL goal <70   PAD (peripheral artery disease) (HCC)   Hyponatremia   Bleeding gums   Unspecified atrial fibrillation (HCC)   Hypothyroidism   Thrombocytopenia (HCC)   Alcohol abuse  1. Hyponatremia.   related to be beer potomania.  Patient describes drinking 6-8 cans of beer a day.  He also drinks 5-6 bottles of water a day.  Osmolarity is low at 253 sodium is low at less than 10.  Cortisol noted to be normal range.  TSH normal at 2.5.  Urine osmolarity low at 101.    Patient started on saline with improvement of labs.  He is been advised to discontinue further.  Intake and limit his free water intake.  He is otherwise asymptomatic at this time. 2. Bleeding gums.  related to supratherapeutic INR.  Coumadin was discontinued and he received a dose of vitamin K.  INR has  normalized and bleeding has resolved. 3. Atrial fibrillation.    Paroxysmal.  Currently in sinus rhythm.  ChadsVasc score is 2 for hypertension and vascular disease.  Since he is having difficulty controlling his INR, he would likely benefit from transition to newer anticoagulants.  He is been transitioned to Xarelto.  At his request, he has been referred back to his primary cardiologist, Dr. [name] 4. Hypothyroidism.  Thyroid function tests are normal.  Continue Synthroid 5. Alcohol abuse.    He was not noted to have any significant alcohol withdrawal throughout his hospital stay.  We discussed the long-term effects of alcohol abuse.  Patient reports that he wishes to stop drinking. 6. Hyperlipidemia.  Continue statin 7. Hypertension.  Blood pressure currently stable. 8. Thrombocytopenia.  Suspect related to alcohol use.  Improving at the time of discharge  Discharge Instructions  Discharge Instructions    Diet - low sodium heart healthy   Complete by:  As directed    Increase activity slowly   Complete by:  As directed      Allergies as of 12/25/2017      Reactions   Glycopyrrolate Other (See Comments)   Intolerance      Medication List    TAKE these medications   ALPRAZolam 1 MG tablet Commonly known as:  XANAX Take 1 mg by mouth at bedtime as needed for anxiety.   aspirin EC 81 MG tablet Take 81 mg by mouth daily.   diphenoxylate-atropine 2.5-0.025 MG tablet Commonly known as:  LOMOTIL Take  1 tablet by mouth every 6 (six) hours as needed.   folic acid 1 MG tablet Commonly known as:  FOLVITE Take 1 tablet (1 mg total) by mouth daily.   levothyroxine 25 MCG tablet Commonly known as:  SYNTHROID, LEVOTHROID Take 2 tablets by mouth daily.   losartan 50 MG tablet Commonly known as:  COZAAR Take 0.5 tablets (25 mg total) by mouth daily.   Oxycodone HCl 20 MG Tabs Take 1 tablet (20 mg total) by mouth every 4 (four) hours as needed (pain).   PROAIR HFA 108 (90 Base)  MCG/ACT inhaler Generic drug:  albuterol Inhale 2 puffs into the lungs every 4 (four) hours as needed for wheezing or shortness of breath.   propranolol 20 MG tablet Commonly known as:  INDERAL Take 20 mg by mouth 2 (two) times daily.   rivaroxaban 20 MG Tabs tablet Commonly known as:  XARELTO Take 1 tablet (20 mg total) by mouth daily with supper.   Vitamin D3 2000 units capsule Take 1 capsule by mouth daily.      Follow-up Information    Lorretta Harp, MD Follow up on 01/25/2018.   Specialties:  Cardiology, Radiology Why:  3:00pm Contact information: [address] [place] [address] [phone]        [name], MD. Schedule an appointment as soon as possible for a visit in 2 week(s).   Specialty:  Internal Medicine Contact information: [address] [place] [address] [phone]          Allergies  Allergen Reactions  . Glycopyrrolate Other (See Comments)    Intolerance    Consultations:     Procedures/Studies:  No results found.    Subjective: Feeling better today.  No further bleeding from gums.  Wants to go home.  Discharge Exam: Vitals:   12/25/17 0614 12/25/17 0842  BP: 137/81 102/62  Pulse: 67 73  Resp: 20   Temp: 98 F (36.7 C)   SpO2: 97%    Vitals:   12/24/17 2155 12/25/17 0000 12/25/17 0614 12/25/17 0842  BP:  102/62 137/81 102/62  Pulse:  91 67 73  Resp:  18 20   Temp:  98.2 F (36.8 C) 98 F (36.7 C)   TempSrc:  Oral Oral   SpO2: 100% 98% 97%   Weight:      Height:        General: Pt is alert, awake, not in acute distress Cardiovascular: RRR, S1/S2 +, no rubs, no gallops Respiratory: CTA bilaterally, no wheezing, no rhonchi Abdominal: Soft, NT, ND, bowel sounds + Extremities: no edema, no cyanosis    The results of significant diagnostics from this hospitalization (including imaging, microbiology, ancillary and laboratory) are listed below for reference.      Microbiology: No results found for this or any previous visit (from the past 240 hour(s)).   Labs: BNP (last 3 results) No results for input(s): BNP in the last 8760 hours. Basic Metabolic Panel: Recent Labs  Lab 12/23/17 1946 12/24/17 0506 12/25/17 0454  NA 119* 123* 128*  K 5.0 4.5 4.1  CL 83* 86* 95*  CO2 27 28 23   GLUCOSE 122* 98 115*  BUN 9 11 13   CREATININE 0.87 0.77 0.91  CALCIUM 9.2 9.0 8.8*   Liver Function Tests: Recent Labs  Lab 12/23/17 1946 12/24/17 0506 12/25/17 0454  AST 107* 83* 93*  ALT 59* 49* 54*  ALKPHOS 99 85 88  BILITOT 1.2 1.3* 0.8  PROT 7.5 6.8 6.8  ALBUMIN 3.8 3.4* 3.4*   No results for input(s): LIPASE, AMYLASE in the last 168 hours. No results for input(s): AMMONIA in the last 168 hours. CBC: Recent Labs  Lab 12/23/17 1946 12/24/17 0506 12/25/17 0454  WBC 4.3 3.1* 3.1*  NEUTROABS 2.4  --   --   HGB 12.9* 11.9* 11.5*  HCT 36.7* 34.2* 33.9*  MCV 94.3 95.5 96.0  PLT 142* 119* 133*   Cardiac Enzymes: No results for input(s): CKTOTAL, CKMB, CKMBINDEX, TROPONINI in the last 168 hours. BNP: Invalid input(s): POCBNP CBG: Recent Labs  Lab 12/24/17 1614  GLUCAP 107*   D-Dimer No results for input(s): DDIMER in the last 72 hours. Hgb A1c No results for input(s): HGBA1C in the last 72 hours. Lipid Profile No results for input(s): CHOL, HDL, LDLCALC, TRIG, CHOLHDL, LDLDIRECT in the last 72 hours. Thyroid function studies Recent Labs    12/23/17 1946  TSH 2.543   Anemia work up No results for input(s): VITAMINB12, FOLATE, FERRITIN, TIBC, IRON, RETICCTPCT in the last 72 hours. Urinalysis    Component Value Date/Time   COLORURINE YELLOW 12/23/2017 2120   APPEARANCEUR CLEAR 12/23/2017 2120   LABSPEC 1.002 (L) 12/23/2017 2120   PHURINE 7.0 12/23/2017 2120   GLUCOSEU NEGATIVE 12/23/2017 2120   HGBUR SMALL (A) 12/23/2017 2120   BILIRUBINUR NEGATIVE 12/23/2017 2120   KETONESUR NEGATIVE 12/23/2017 2120   PROTEINUR NEGATIVE  12/23/2017 2120   UROBILINOGEN 1.0 11/11/2013 0618   NITRITE NEGATIVE 12/23/2017 2120   LEUKOCYTESUR NEGATIVE 12/23/2017 2120   Sepsis Labs Invalid input(s): PROCALCITONIN,  WBC,  LACTICIDVEN Microbiology No results found for this or any previous visit (from the past 240 hour(s)).   Time coordinating discharge: 79mins  SIGNED:   Kathie Dike, MD  [facility] 12/25/2017, 6:17 PM Pager   If 7PM-7AM, please contact night-coverage [email or web address] Password [email or web address]

## 2017-12-25 NOTE — Progress Notes (Signed)
Pt complains of discomfort at IV site on right upper arm. IV assessed and infiltrated. IV has been removed and catheter clean dry and intact. IV site swollen and warm compress applied to site. Will continue to monitor patient.

## 2017-12-25 NOTE — Progress Notes (Signed)
Pt's IV catheter removed and intact. Pt's IV site clean dry and intact. Discharge instructions including medications and follow up appointments were reviewed and discussed with patient. All questions were answered and no further questions at this time. Pt in stable condition and in no acute distress at time of discharge. Pt escorted by RN. 

## 2018-01-01 DIAGNOSIS — K219 Gastro-esophageal reflux disease without esophagitis: Secondary | ICD-10-CM | POA: Diagnosis not present

## 2018-01-01 DIAGNOSIS — E871 Hypo-osmolality and hyponatremia: Secondary | ICD-10-CM | POA: Diagnosis not present

## 2018-01-01 DIAGNOSIS — I4891 Unspecified atrial fibrillation: Secondary | ICD-10-CM | POA: Diagnosis not present

## 2018-01-01 DIAGNOSIS — D649 Anemia, unspecified: Secondary | ICD-10-CM | POA: Diagnosis not present

## 2018-01-01 DIAGNOSIS — I10 Essential (primary) hypertension: Secondary | ICD-10-CM | POA: Diagnosis not present

## 2018-01-24 DIAGNOSIS — K911 Postgastric surgery syndromes: Secondary | ICD-10-CM | POA: Diagnosis not present

## 2018-01-24 DIAGNOSIS — G894 Chronic pain syndrome: Secondary | ICD-10-CM | POA: Diagnosis not present

## 2018-01-24 DIAGNOSIS — K222 Esophageal obstruction: Secondary | ICD-10-CM | POA: Diagnosis not present

## 2018-01-25 ENCOUNTER — Ambulatory Visit: Payer: Medicare Other | Admitting: Cardiovascular Disease

## 2018-01-25 ENCOUNTER — Encounter: Payer: Self-pay | Admitting: Cardiovascular Disease

## 2018-01-25 DIAGNOSIS — E785 Hyperlipidemia, unspecified: Secondary | ICD-10-CM | POA: Diagnosis not present

## 2018-01-25 DIAGNOSIS — I739 Peripheral vascular disease, unspecified: Secondary | ICD-10-CM | POA: Diagnosis not present

## 2018-01-25 DIAGNOSIS — I2510 Atherosclerotic heart disease of native coronary artery without angina pectoris: Secondary | ICD-10-CM

## 2018-01-25 DIAGNOSIS — I10 Essential (primary) hypertension: Secondary | ICD-10-CM | POA: Diagnosis not present

## 2018-01-25 NOTE — Assessment & Plan Note (Signed)
History of essential hypertension her blood pressure measured at 136/77.  He is on losartan and propranolol.  Continue current meds at current dosing

## 2018-01-25 NOTE — Assessment & Plan Note (Signed)
History of PAD with critical limb ischemia status post left common iliac artery stenting by myself 11/03/2013 with subsequent left common femoral endarterectomy and patch angioplasty by Dr. Scot Dock which resulted in healing of his left foot wound.  His last Dopplers performed 07/28/2014 revealed ABIs of 1 bilaterally with a patent iliac stent common femoral artery on the left.

## 2018-01-25 NOTE — Patient Instructions (Signed)
Your physician wants you to follow-up in: ONE YEAR WITH DR BERRY You will receive a reminder letter in the mail two months in advance. If you don't receive a letter, please call our office to schedule the follow-up appointment.   If you need a refill on your cardiac medications before your next appointment, please call your pharmacy.  

## 2018-01-25 NOTE — Assessment & Plan Note (Signed)
History of CAD status post cardiac catheterization performed by myself 02/15/2015 after a mildly abnormal stress test for preoperative clearance prior to total esophagectomy for esophageal cancer.  This revealed moderate disease in his proximal LAD, mid and distal RCA.  I cleared him for surgery which ultimately he did well from.  He denies chest pain or shortness of breath.

## 2018-01-25 NOTE — Progress Notes (Signed)
01/25/2018 Jimmy Tucker   1960/09/21  [id number]  Primary Physician [name], MD Primary Cardiologist: Lorretta Harp MD Garret Reddish, [place], [place]  HPI:  Jimmy Tucker is a 57 y.o.  Married Caucasian male who I saw in the office 07/28/2014.  He currently is a patient of Dr. Gerarda Fraction in [place]. He has a history of tobacco abuse (recently discontinued), hypertension and hyperlipidemia. Because of critical limb ischemia he had arterial Dopplers performed in our office on 5 10/30/2013 revealing an occluded common iliac, occluded SFA and proximal SFA.I performed angiography on him 11/03/13 and stented a subtotally occluded left common iliac artery. He did have an occluded left common femoral and diffuse SFA disease. Dr. Scot Dock performed endarterectomy with bovine patch angioplasty of his occluded left common femoral artery on 11/11/13. Doppler's office today revealed a normal left ABI. His ulcers subsequently healed. He has since stopped smoking. He denies chest pain or shortness of breath. Because of a abnormal Myoview stress test that showed mild ischemic abnormalities in the RCA and LAD territories I performed cardiac catheterization on him 02/15/2015 revealing moderate disease in the LAD and RCA which I did not think was high high-grade.  He subsequently underwent total esophagectomy without cardiovascular sequela.  He denies chest pain or shortness of breath.  He was found to have PAF and was placed on Xarelto.   Current Meds  Medication Sig  . albuterol (PROAIR HFA) 108 (90 BASE) MCG/ACT inhaler Inhale 2 puffs into the lungs every 4 (four) hours as needed for wheezing or shortness of breath.   . ALPRAZolam (XANAX) 1 MG tablet Take 1 mg by mouth at bedtime as needed for anxiety.   Marland Kitchen aspirin EC 81 MG tablet Take 81 mg by mouth daily.  . Cholecalciferol (VITAMIN D3) 2000 units capsule Take 1 capsule by mouth daily.  . diphenoxylate-atropine (LOMOTIL) 2.5-0.025 MG tablet Take 1 tablet by  mouth every 6 (six) hours as needed.  . folic acid (FOLVITE) 1 MG tablet Take 1 tablet (1 mg total) by mouth daily.  Marland Kitchen levothyroxine (SYNTHROID, LEVOTHROID) 25 MCG tablet Take 2 tablets by mouth daily.  Marland Kitchen losartan (COZAAR) 50 MG tablet Take 0.5 tablets (25 mg total) by mouth daily.  . Oxycodone HCl 20 MG TABS Take 1 tablet (20 mg total) by mouth every 4 (four) hours as needed (pain).  . propranolol (INDERAL) 20 MG tablet Take 20 mg by mouth 2 (two) times daily.   . rivaroxaban (XARELTO) 20 MG TABS tablet Take 1 tablet (20 mg total) by mouth daily with supper.   Current Facility-Administered Medications for the 01/25/18 encounter (Office Visit) with Lorretta Harp, MD  Medication  . promethazine (PHENERGAN) injection 25 mg     Allergies  Allergen Reactions  . Glycopyrrolate Other (See Comments)    Intolerance    Social History   Socioeconomic History  . Marital status: Married    Spouse name: Not on file  . Number of children: Not on file  . Years of education: Not on file  . Highest education level: Not on file  Occupational History  . Occupation: [employer]     Comment: in [place]  . Financial resource strain: Not on file  . Food insecurity:    Worry: Not on file    Inability: Not on file  . Transportation needs:    Medical: Not on file    Non-medical: Not on file  Tobacco Use  . Smoking status:  Former Smoker    Packs/day: 1.00    Years: 40.00    Pack years: 40.00    Types: Cigarettes    Last attempt to quit: 11/02/2013    Years since quitting: 4.2  . Smokeless tobacco: Never Used  Substance and Sexual Activity  . Alcohol use: Yes    Alcohol/week: 12.6 oz    Types: 21 Cans of beer per week    Comment: every day   . Drug use: No  . Sexual activity: Not Currently  Lifestyle  . Physical activity:    Days per week: Not on file    Minutes per session: Not on file  . Stress: Not on file  Relationships  . Social connections:    Talks on phone: Not  on file    Gets together: Not on file    Attends religious service: Not on file    Active member of club or organization: Not on file    Attends meetings of clubs or organizations: Not on file    Relationship status: Not on file  . Intimate partner violence:    Fear of current or ex partner: Not on file    Emotionally abused: Not on file    Physically abused: Not on file    Forced sexual activity: Not on file  Other Topics Concern  . Not on file  Social History Narrative   Married, still smokes about a pack a day. Unable to work currently because of his claudication.   He does work for [employer] in [place], [place].     Review of Systems: General: negative for chills, fever, night sweats or weight changes.  Cardiovascular: negative for chest pain, dyspnea on exertion, edema, orthopnea, palpitations, paroxysmal nocturnal dyspnea or shortness of breath Dermatological: negative for rash Respiratory: negative for cough or wheezing Urologic: negative for hematuria Abdominal: negative for nausea, vomiting, diarrhea, bright red blood per rectum, melena, or hematemesis Neurologic: negative for visual changes, syncope, or dizziness All other systems reviewed and are otherwise negative except as noted above.    Blood pressure 136/77, pulse 68, height 5\' 6"  (1.676 m), weight 159 lb (72.1 kg).  General appearance: alert and no distress Neck: no adenopathy, no carotid bruit, no JVD, supple, symmetrical, trachea midline and thyroid not enlarged, symmetric, no tenderness/mass/nodules Lungs: clear to auscultation bilaterally Heart: regular rate and rhythm, S1, S2 normal, no murmur, click, rub or gallop Extremities: extremities normal, atraumatic, no cyanosis or edema Pulses: 2+ and symmetric Skin: Skin color, texture, turgor normal. No rashes or lesions Neurologic: Alert and oriented X 3, normal strength and tone. Normal symmetric reflexes. Normal coordination and  gait  EKG not performed today  ASSESSMENT AND PLAN:   Essential hypertension History of essential hypertension her blood pressure measured at 136/77.  He is on losartan and propranolol.  Continue current meds at current dosing  PAD (peripheral artery disease) ([place]) History of PAD with critical limb ischemia status post left common iliac artery stenting by myself 11/03/2013 with subsequent left common femoral endarterectomy and patch angioplasty by Dr. Scot Dock which resulted in healing of his left foot wound.  His last Dopplers performed 07/28/2014 revealed ABIs of 1 bilaterally with a patent iliac stent common femoral artery on the left.  Hyperlipidemia LDL goal <70 History of hyperlipidemia not on statin therapy due to statin intolerance followed by his PCP  Coronary artery disease History of CAD status post cardiac catheterization performed by myself 02/15/2015 after a mildly abnormal stress test  for preoperative clearance prior to total esophagectomy for esophageal cancer.  This revealed moderate disease in his proximal LAD, mid and distal RCA.  I cleared him for surgery which ultimately he did well from.  He denies chest pain or shortness of breath.      Lorretta Harp MD FACP,FACC,FAHA, [facility] 01/25/2018 3:24 PM

## 2018-01-25 NOTE — Assessment & Plan Note (Signed)
History of hyperlipidemia not on statin therapy due to statin intolerance followed by his PCP

## 2018-03-29 DIAGNOSIS — K222 Esophageal obstruction: Secondary | ICD-10-CM | POA: Diagnosis not present

## 2018-03-29 DIAGNOSIS — Z9049 Acquired absence of other specified parts of digestive tract: Secondary | ICD-10-CM | POA: Diagnosis not present

## 2018-03-29 DIAGNOSIS — Z87891 Personal history of nicotine dependence: Secondary | ICD-10-CM | POA: Diagnosis not present

## 2018-03-29 DIAGNOSIS — Z8501 Personal history of malignant neoplasm of esophagus: Secondary | ICD-10-CM | POA: Diagnosis not present

## 2018-03-29 DIAGNOSIS — K9189 Other postprocedural complications and disorders of digestive system: Secondary | ICD-10-CM | POA: Diagnosis not present

## 2018-04-11 DIAGNOSIS — Z9049 Acquired absence of other specified parts of digestive tract: Secondary | ICD-10-CM | POA: Diagnosis not present

## 2018-04-11 DIAGNOSIS — Z8501 Personal history of malignant neoplasm of esophagus: Secondary | ICD-10-CM | POA: Diagnosis not present

## 2018-04-11 DIAGNOSIS — K9189 Other postprocedural complications and disorders of digestive system: Secondary | ICD-10-CM | POA: Diagnosis not present

## 2018-04-11 DIAGNOSIS — I739 Peripheral vascular disease, unspecified: Secondary | ICD-10-CM | POA: Diagnosis not present

## 2018-04-11 DIAGNOSIS — Z7901 Long term (current) use of anticoagulants: Secondary | ICD-10-CM | POA: Diagnosis not present

## 2018-04-11 DIAGNOSIS — K222 Esophageal obstruction: Secondary | ICD-10-CM | POA: Diagnosis not present

## 2018-04-15 DIAGNOSIS — I10 Essential (primary) hypertension: Secondary | ICD-10-CM | POA: Diagnosis not present

## 2018-04-15 DIAGNOSIS — K219 Gastro-esophageal reflux disease without esophagitis: Secondary | ICD-10-CM | POA: Diagnosis not present

## 2018-04-15 DIAGNOSIS — Z6824 Body mass index (BMI) 24.0-24.9, adult: Secondary | ICD-10-CM | POA: Diagnosis not present

## 2018-04-15 DIAGNOSIS — J449 Chronic obstructive pulmonary disease, unspecified: Secondary | ICD-10-CM | POA: Diagnosis not present

## 2018-04-15 DIAGNOSIS — M62830 Muscle spasm of back: Secondary | ICD-10-CM | POA: Diagnosis not present

## 2018-04-18 DIAGNOSIS — C159 Malignant neoplasm of esophagus, unspecified: Secondary | ICD-10-CM | POA: Diagnosis not present

## 2018-04-18 DIAGNOSIS — C160 Malignant neoplasm of cardia: Secondary | ICD-10-CM | POA: Diagnosis not present

## 2018-04-18 DIAGNOSIS — Z23 Encounter for immunization: Secondary | ICD-10-CM | POA: Diagnosis not present

## 2018-04-18 DIAGNOSIS — Z483 Aftercare following surgery for neoplasm: Secondary | ICD-10-CM | POA: Diagnosis not present

## 2018-04-18 DIAGNOSIS — E46 Unspecified protein-calorie malnutrition: Secondary | ICD-10-CM | POA: Diagnosis not present

## 2018-04-26 DIAGNOSIS — K222 Esophageal obstruction: Secondary | ICD-10-CM | POA: Diagnosis not present

## 2018-04-26 DIAGNOSIS — I10 Essential (primary) hypertension: Secondary | ICD-10-CM | POA: Diagnosis not present

## 2018-04-26 DIAGNOSIS — K9189 Other postprocedural complications and disorders of digestive system: Secondary | ICD-10-CM | POA: Diagnosis not present

## 2018-04-26 DIAGNOSIS — Z9049 Acquired absence of other specified parts of digestive tract: Secondary | ICD-10-CM | POA: Diagnosis not present

## 2018-04-26 DIAGNOSIS — I739 Peripheral vascular disease, unspecified: Secondary | ICD-10-CM | POA: Diagnosis not present

## 2018-04-26 DIAGNOSIS — Z8501 Personal history of malignant neoplasm of esophagus: Secondary | ICD-10-CM | POA: Diagnosis not present

## 2018-04-26 DIAGNOSIS — Z7901 Long term (current) use of anticoagulants: Secondary | ICD-10-CM | POA: Diagnosis not present

## 2018-05-27 DIAGNOSIS — M1991 Primary osteoarthritis, unspecified site: Secondary | ICD-10-CM | POA: Diagnosis not present

## 2018-05-27 DIAGNOSIS — Z0001 Encounter for general adult medical examination with abnormal findings: Secondary | ICD-10-CM | POA: Diagnosis not present

## 2018-05-27 DIAGNOSIS — J449 Chronic obstructive pulmonary disease, unspecified: Secondary | ICD-10-CM | POA: Diagnosis not present

## 2018-05-27 DIAGNOSIS — G894 Chronic pain syndrome: Secondary | ICD-10-CM | POA: Diagnosis not present

## 2018-05-27 DIAGNOSIS — M545 Low back pain: Secondary | ICD-10-CM | POA: Diagnosis not present

## 2018-05-27 DIAGNOSIS — J45909 Unspecified asthma, uncomplicated: Secondary | ICD-10-CM | POA: Diagnosis not present

## 2018-06-13 DIAGNOSIS — M1991 Primary osteoarthritis, unspecified site: Secondary | ICD-10-CM | POA: Diagnosis not present

## 2018-06-13 DIAGNOSIS — I4891 Unspecified atrial fibrillation: Secondary | ICD-10-CM | POA: Diagnosis not present

## 2018-06-13 DIAGNOSIS — I10 Essential (primary) hypertension: Secondary | ICD-10-CM | POA: Diagnosis not present

## 2018-06-13 DIAGNOSIS — Z6824 Body mass index (BMI) 24.0-24.9, adult: Secondary | ICD-10-CM | POA: Diagnosis not present

## 2018-06-13 DIAGNOSIS — R2681 Unsteadiness on feet: Secondary | ICD-10-CM | POA: Diagnosis not present

## 2018-06-13 DIAGNOSIS — G894 Chronic pain syndrome: Secondary | ICD-10-CM | POA: Diagnosis not present

## 2018-06-13 DIAGNOSIS — K219 Gastro-esophageal reflux disease without esophagitis: Secondary | ICD-10-CM | POA: Diagnosis not present

## 2018-06-13 DIAGNOSIS — G8220 Paraplegia, unspecified: Secondary | ICD-10-CM | POA: Diagnosis not present

## 2018-07-11 DIAGNOSIS — K222 Esophageal obstruction: Secondary | ICD-10-CM | POA: Diagnosis not present

## 2018-07-11 DIAGNOSIS — Z87891 Personal history of nicotine dependence: Secondary | ICD-10-CM | POA: Diagnosis not present

## 2018-07-11 DIAGNOSIS — Z7901 Long term (current) use of anticoagulants: Secondary | ICD-10-CM | POA: Diagnosis not present

## 2018-07-11 DIAGNOSIS — C155 Malignant neoplasm of lower third of esophagus: Secondary | ICD-10-CM | POA: Diagnosis not present

## 2018-07-11 DIAGNOSIS — I739 Peripheral vascular disease, unspecified: Secondary | ICD-10-CM | POA: Diagnosis not present

## 2018-07-11 DIAGNOSIS — Z8501 Personal history of malignant neoplasm of esophagus: Secondary | ICD-10-CM | POA: Diagnosis not present

## 2018-07-11 DIAGNOSIS — Z9049 Acquired absence of other specified parts of digestive tract: Secondary | ICD-10-CM | POA: Diagnosis not present

## 2018-07-11 DIAGNOSIS — K9189 Other postprocedural complications and disorders of digestive system: Secondary | ICD-10-CM | POA: Diagnosis not present

## 2018-07-11 DIAGNOSIS — J449 Chronic obstructive pulmonary disease, unspecified: Secondary | ICD-10-CM | POA: Diagnosis not present

## 2018-07-17 DIAGNOSIS — G894 Chronic pain syndrome: Secondary | ICD-10-CM | POA: Diagnosis not present

## 2018-07-17 DIAGNOSIS — R296 Repeated falls: Secondary | ICD-10-CM | POA: Diagnosis not present

## 2018-07-17 DIAGNOSIS — Z79899 Other long term (current) drug therapy: Secondary | ICD-10-CM | POA: Diagnosis not present

## 2018-07-17 DIAGNOSIS — R270 Ataxia, unspecified: Secondary | ICD-10-CM | POA: Diagnosis not present

## 2018-07-22 ENCOUNTER — Other Ambulatory Visit (HOSPITAL_COMMUNITY): Payer: Self-pay | Admitting: Neurology

## 2018-07-22 DIAGNOSIS — M545 Low back pain, unspecified: Secondary | ICD-10-CM

## 2018-07-22 DIAGNOSIS — R270 Ataxia, unspecified: Secondary | ICD-10-CM

## 2018-07-22 DIAGNOSIS — W19XXXA Unspecified fall, initial encounter: Secondary | ICD-10-CM

## 2018-07-26 DIAGNOSIS — E785 Hyperlipidemia, unspecified: Secondary | ICD-10-CM | POA: Diagnosis not present

## 2018-07-26 DIAGNOSIS — Z8501 Personal history of malignant neoplasm of esophagus: Secondary | ICD-10-CM | POA: Diagnosis not present

## 2018-07-26 DIAGNOSIS — J449 Chronic obstructive pulmonary disease, unspecified: Secondary | ICD-10-CM | POA: Diagnosis not present

## 2018-07-26 DIAGNOSIS — Z7901 Long term (current) use of anticoagulants: Secondary | ICD-10-CM | POA: Diagnosis not present

## 2018-07-26 DIAGNOSIS — I10 Essential (primary) hypertension: Secondary | ICD-10-CM | POA: Diagnosis not present

## 2018-07-26 DIAGNOSIS — Z87891 Personal history of nicotine dependence: Secondary | ICD-10-CM | POA: Diagnosis not present

## 2018-07-26 DIAGNOSIS — E039 Hypothyroidism, unspecified: Secondary | ICD-10-CM | POA: Diagnosis not present

## 2018-07-26 DIAGNOSIS — K222 Esophageal obstruction: Secondary | ICD-10-CM | POA: Diagnosis not present

## 2018-07-26 DIAGNOSIS — K9189 Other postprocedural complications and disorders of digestive system: Secondary | ICD-10-CM | POA: Diagnosis not present

## 2018-07-26 DIAGNOSIS — I739 Peripheral vascular disease, unspecified: Secondary | ICD-10-CM | POA: Diagnosis not present

## 2018-07-26 DIAGNOSIS — Z9049 Acquired absence of other specified parts of digestive tract: Secondary | ICD-10-CM | POA: Diagnosis not present

## 2018-07-29 ENCOUNTER — Ambulatory Visit (HOSPITAL_COMMUNITY): Payer: Medicare Other

## 2018-07-31 ENCOUNTER — Ambulatory Visit (HOSPITAL_COMMUNITY)
Admission: RE | Admit: 2018-07-31 | Discharge: 2018-07-31 | Disposition: A | Discharge status: Home / Self Care | Payer: Medicare Other | Source: Ambulatory Visit | Attending: Neurology | Admitting: Neurology

## 2018-07-31 DIAGNOSIS — Z8501 Personal history of malignant neoplasm of esophagus: Secondary | ICD-10-CM | POA: Diagnosis not present

## 2018-07-31 DIAGNOSIS — M4626 Osteomyelitis of vertebra, lumbar region: Secondary | ICD-10-CM | POA: Diagnosis not present

## 2018-07-31 DIAGNOSIS — R251 Tremor, unspecified: Secondary | ICD-10-CM | POA: Diagnosis not present

## 2018-07-31 DIAGNOSIS — M545 Low back pain, unspecified: Secondary | ICD-10-CM

## 2018-07-31 DIAGNOSIS — I2510 Atherosclerotic heart disease of native coronary artery without angina pectoris: Secondary | ICD-10-CM | POA: Diagnosis not present

## 2018-07-31 DIAGNOSIS — R410 Disorientation, unspecified: Secondary | ICD-10-CM | POA: Diagnosis not present

## 2018-07-31 DIAGNOSIS — I739 Peripheral vascular disease, unspecified: Secondary | ICD-10-CM | POA: Diagnosis not present

## 2018-07-31 DIAGNOSIS — B954 Other streptococcus as the cause of diseases classified elsewhere: Secondary | ICD-10-CM | POA: Diagnosis not present

## 2018-07-31 DIAGNOSIS — R296 Repeated falls: Secondary | ICD-10-CM | POA: Insufficient documentation

## 2018-07-31 DIAGNOSIS — Z888 Allergy status to other drugs, medicaments and biological substances status: Secondary | ICD-10-CM | POA: Diagnosis not present

## 2018-07-31 DIAGNOSIS — M4646 Discitis, unspecified, lumbar region: Secondary | ICD-10-CM | POA: Diagnosis not present

## 2018-07-31 DIAGNOSIS — Z7982 Long term (current) use of aspirin: Secondary | ICD-10-CM | POA: Diagnosis not present

## 2018-07-31 DIAGNOSIS — E039 Hypothyroidism, unspecified: Secondary | ICD-10-CM | POA: Diagnosis not present

## 2018-07-31 DIAGNOSIS — K0889 Other specified disorders of teeth and supporting structures: Secondary | ICD-10-CM | POA: Diagnosis not present

## 2018-07-31 DIAGNOSIS — Z79890 Hormone replacement therapy (postmenopausal): Secondary | ICD-10-CM | POA: Diagnosis not present

## 2018-07-31 DIAGNOSIS — Z85028 Personal history of other malignant neoplasm of stomach: Secondary | ICD-10-CM | POA: Diagnosis not present

## 2018-07-31 DIAGNOSIS — E871 Hypo-osmolality and hyponatremia: Secondary | ICD-10-CM | POA: Diagnosis not present

## 2018-07-31 DIAGNOSIS — F17210 Nicotine dependence, cigarettes, uncomplicated: Secondary | ICD-10-CM | POA: Diagnosis not present

## 2018-07-31 DIAGNOSIS — L89312 Pressure ulcer of right buttock, stage 2: Secondary | ICD-10-CM | POA: Diagnosis not present

## 2018-07-31 DIAGNOSIS — Z95828 Presence of other vascular implants and grafts: Secondary | ICD-10-CM | POA: Diagnosis not present

## 2018-07-31 DIAGNOSIS — W19XXXA Unspecified fall, initial encounter: Secondary | ICD-10-CM

## 2018-07-31 DIAGNOSIS — G250 Essential tremor: Secondary | ICD-10-CM | POA: Diagnosis not present

## 2018-07-31 DIAGNOSIS — E785 Hyperlipidemia, unspecified: Secondary | ICD-10-CM | POA: Diagnosis not present

## 2018-07-31 DIAGNOSIS — D37030 Neoplasm of uncertain behavior of the parotid salivary glands: Secondary | ICD-10-CM | POA: Insufficient documentation

## 2018-07-31 DIAGNOSIS — G8929 Other chronic pain: Secondary | ICD-10-CM | POA: Diagnosis not present

## 2018-07-31 DIAGNOSIS — K911 Postgastric surgery syndromes: Secondary | ICD-10-CM | POA: Diagnosis not present

## 2018-07-31 DIAGNOSIS — R270 Ataxia, unspecified: Secondary | ICD-10-CM | POA: Insufficient documentation

## 2018-07-31 DIAGNOSIS — I480 Paroxysmal atrial fibrillation: Secondary | ICD-10-CM | POA: Diagnosis not present

## 2018-07-31 DIAGNOSIS — K222 Esophageal obstruction: Secondary | ICD-10-CM | POA: Diagnosis not present

## 2018-07-31 DIAGNOSIS — Z79899 Other long term (current) drug therapy: Secondary | ICD-10-CM | POA: Diagnosis not present

## 2018-07-31 DIAGNOSIS — L89322 Pressure ulcer of left buttock, stage 2: Secondary | ICD-10-CM | POA: Diagnosis not present

## 2018-07-31 DIAGNOSIS — M5126 Other intervertebral disc displacement, lumbar region: Secondary | ICD-10-CM | POA: Diagnosis not present

## 2018-07-31 DIAGNOSIS — K089 Disorder of teeth and supporting structures, unspecified: Secondary | ICD-10-CM | POA: Diagnosis not present

## 2018-07-31 DIAGNOSIS — Z7901 Long term (current) use of anticoagulants: Secondary | ICD-10-CM | POA: Diagnosis not present

## 2018-07-31 DIAGNOSIS — Z87891 Personal history of nicotine dependence: Secondary | ICD-10-CM | POA: Diagnosis not present

## 2018-07-31 DIAGNOSIS — I10 Essential (primary) hypertension: Secondary | ICD-10-CM | POA: Diagnosis not present

## 2018-07-31 DIAGNOSIS — M47816 Spondylosis without myelopathy or radiculopathy, lumbar region: Secondary | ICD-10-CM | POA: Insufficient documentation

## 2018-08-01 NOTE — Progress Notes (Signed)
The results of the lumbar spine MRI ordered reviewed in person.  The findings seems most concerning for osteomyelitis involving  L1-L3.The study is done without contrast.  It turns out that the patient has 2 sacral ulcers which I was not aware about.  I informed them to go to emergency room as soon as possible for likely IV antibiotics, admission and possible surgery.

## 2018-08-02 ENCOUNTER — Other Ambulatory Visit: Payer: Self-pay

## 2018-08-02 ENCOUNTER — Inpatient Hospital Stay (HOSPITAL_COMMUNITY)
Admission: EM | Admit: 2018-08-02 | Discharge: 2018-08-06 | DRG: 540 | Disposition: A | Discharge status: Home Health | Payer: Medicare Other | Attending: Internal Medicine | Admitting: Internal Medicine

## 2018-08-02 ENCOUNTER — Encounter (HOSPITAL_COMMUNITY): Payer: Self-pay | Admitting: Emergency Medicine

## 2018-08-02 DIAGNOSIS — Z85028 Personal history of other malignant neoplasm of stomach: Secondary | ICD-10-CM

## 2018-08-02 DIAGNOSIS — M4626 Osteomyelitis of vertebra, lumbar region: Principal | ICD-10-CM | POA: Diagnosis present

## 2018-08-02 DIAGNOSIS — Z79890 Hormone replacement therapy (postmenopausal): Secondary | ICD-10-CM

## 2018-08-02 DIAGNOSIS — Z955 Presence of coronary angioplasty implant and graft: Secondary | ICD-10-CM

## 2018-08-02 DIAGNOSIS — I2510 Atherosclerotic heart disease of native coronary artery without angina pectoris: Secondary | ICD-10-CM | POA: Diagnosis not present

## 2018-08-02 DIAGNOSIS — I10 Essential (primary) hypertension: Secondary | ICD-10-CM | POA: Diagnosis not present

## 2018-08-02 DIAGNOSIS — L89312 Pressure ulcer of right buttock, stage 2: Secondary | ICD-10-CM | POA: Diagnosis not present

## 2018-08-02 DIAGNOSIS — G250 Essential tremor: Secondary | ICD-10-CM | POA: Diagnosis present

## 2018-08-02 DIAGNOSIS — G8929 Other chronic pain: Secondary | ICD-10-CM | POA: Diagnosis present

## 2018-08-02 DIAGNOSIS — Z7982 Long term (current) use of aspirin: Secondary | ICD-10-CM | POA: Diagnosis not present

## 2018-08-02 DIAGNOSIS — E785 Hyperlipidemia, unspecified: Secondary | ICD-10-CM | POA: Diagnosis present

## 2018-08-02 DIAGNOSIS — Z7901 Long term (current) use of anticoagulants: Secondary | ICD-10-CM | POA: Diagnosis not present

## 2018-08-02 DIAGNOSIS — I480 Paroxysmal atrial fibrillation: Secondary | ICD-10-CM | POA: Diagnosis not present

## 2018-08-02 DIAGNOSIS — Z8501 Personal history of malignant neoplasm of esophagus: Secondary | ICD-10-CM | POA: Diagnosis not present

## 2018-08-02 DIAGNOSIS — Z888 Allergy status to other drugs, medicaments and biological substances status: Secondary | ICD-10-CM

## 2018-08-02 DIAGNOSIS — E039 Hypothyroidism, unspecified: Secondary | ICD-10-CM | POA: Diagnosis not present

## 2018-08-02 DIAGNOSIS — R2681 Unsteadiness on feet: Secondary | ICD-10-CM | POA: Diagnosis present

## 2018-08-02 DIAGNOSIS — K911 Postgastric surgery syndromes: Secondary | ICD-10-CM | POA: Diagnosis present

## 2018-08-02 DIAGNOSIS — F17210 Nicotine dependence, cigarettes, uncomplicated: Secondary | ICD-10-CM | POA: Diagnosis present

## 2018-08-02 DIAGNOSIS — M47816 Spondylosis without myelopathy or radiculopathy, lumbar region: Secondary | ICD-10-CM | POA: Diagnosis present

## 2018-08-02 DIAGNOSIS — M4646 Discitis, unspecified, lumbar region: Secondary | ICD-10-CM | POA: Diagnosis present

## 2018-08-02 DIAGNOSIS — K089 Disorder of teeth and supporting structures, unspecified: Secondary | ICD-10-CM | POA: Diagnosis present

## 2018-08-02 DIAGNOSIS — M545 Low back pain, unspecified: Secondary | ICD-10-CM | POA: Insufficient documentation

## 2018-08-02 DIAGNOSIS — E871 Hypo-osmolality and hyponatremia: Secondary | ICD-10-CM | POA: Diagnosis not present

## 2018-08-02 DIAGNOSIS — Z79899 Other long term (current) drug therapy: Secondary | ICD-10-CM

## 2018-08-02 DIAGNOSIS — I4891 Unspecified atrial fibrillation: Secondary | ICD-10-CM | POA: Diagnosis present

## 2018-08-02 DIAGNOSIS — K222 Esophageal obstruction: Secondary | ICD-10-CM

## 2018-08-02 DIAGNOSIS — B954 Other streptococcus as the cause of diseases classified elsewhere: Secondary | ICD-10-CM | POA: Diagnosis present

## 2018-08-02 DIAGNOSIS — Z8249 Family history of ischemic heart disease and other diseases of the circulatory system: Secondary | ICD-10-CM

## 2018-08-02 DIAGNOSIS — L89322 Pressure ulcer of left buttock, stage 2: Secondary | ICD-10-CM | POA: Diagnosis not present

## 2018-08-02 DIAGNOSIS — L8990 Pressure ulcer of unspecified site, unspecified stage: Secondary | ICD-10-CM | POA: Diagnosis present

## 2018-08-02 DIAGNOSIS — I739 Peripheral vascular disease, unspecified: Secondary | ICD-10-CM | POA: Diagnosis present

## 2018-08-02 LAB — CBC WITH DIFFERENTIAL/PLATELET
Abs Immature Granulocytes: 0.04 10*3/uL (ref 0.00–0.07)
Basophils Absolute: 0 10*3/uL (ref 0.0–0.1)
Basophils Relative: 0 %
Eosinophils Absolute: 0 10*3/uL (ref 0.0–0.5)
Eosinophils Relative: 1 %
HCT: 34.5 % — ABNORMAL LOW (ref 39.0–52.0)
Hemoglobin: 11.7 g/dL — ABNORMAL LOW (ref 13.0–17.0)
Immature Granulocytes: 1 %
LYMPHS ABS: 1.3 10*3/uL (ref 0.7–4.0)
Lymphocytes Relative: 21 %
MCH: 33.7 pg (ref 26.0–34.0)
MCHC: 33.9 g/dL (ref 30.0–36.0)
MCV: 99.4 fL (ref 80.0–100.0)
Monocytes Absolute: 0.4 10*3/uL (ref 0.1–1.0)
Monocytes Relative: 6 %
NEUTROS ABS: 4.5 10*3/uL (ref 1.7–7.7)
Neutrophils Relative %: 71 %
Platelets: 215 10*3/uL (ref 150–400)
RBC: 3.47 MIL/uL — ABNORMAL LOW (ref 4.22–5.81)
RDW: 13 % (ref 11.5–15.5)
WBC: 6.3 10*3/uL (ref 4.0–10.5)
nRBC: 0 % (ref 0.0–0.2)

## 2018-08-02 LAB — COMPREHENSIVE METABOLIC PANEL
ALBUMIN: 3.3 g/dL — AB (ref 3.5–5.0)
ALT: 17 U/L (ref 0–44)
AST: 31 U/L (ref 15–41)
Alkaline Phosphatase: 92 U/L (ref 38–126)
Anion gap: 12 (ref 5–15)
BUN: 6 mg/dL (ref 6–20)
CO2: 26 mmol/L (ref 22–32)
CREATININE: 0.81 mg/dL (ref 0.61–1.24)
Calcium: 9 mg/dL (ref 8.9–10.3)
Chloride: 90 mmol/L — ABNORMAL LOW (ref 98–111)
GFR calc Af Amer: >60 mL/min (ref >60)
GFR calc non Af Amer: >60 mL/min (ref >60)
Glucose, Bld: 107 mg/dL — ABNORMAL HIGH (ref 70–99)
Potassium: 3.9 mmol/L (ref 3.5–5.1)
Sodium: 128 mmol/L — ABNORMAL LOW (ref 135–145)
Total Bilirubin: 0.8 mg/dL (ref 0.3–1.2)
Total Protein: 7 g/dL (ref 6.5–8.1)

## 2018-08-02 MED ORDER — LOSARTAN POTASSIUM 25 MG PO TABS
25.0000 mg | ORAL_TABLET | Freq: Every day | ORAL | Status: DC
  Administered 2018-08-03 – 2018-08-06 (×4): 25 mg via ORAL
  Filled 2018-08-02 (×4): qty 1

## 2018-08-02 MED ORDER — ALBUTEROL SULFATE (2.5 MG/3ML) 0.083% IN NEBU: 3.0000 mL | INHALATION_SOLUTION | RESPIRATORY_TRACT | Status: DC | PRN

## 2018-08-02 MED ORDER — SODIUM CHLORIDE 0.9 % IV SOLN
INTRAVENOUS | Status: DC
  Administered 2018-08-02 – 2018-08-03 (×2): via INTRAVENOUS

## 2018-08-02 MED ORDER — FOLIC ACID 1 MG PO TABS
1.0000 mg | ORAL_TABLET | Freq: Every day | ORAL | Status: DC
  Administered 2018-08-03 – 2018-08-06 (×4): 1 mg via ORAL
  Filled 2018-08-02 (×4): qty 1

## 2018-08-02 MED ORDER — LEVOTHYROXINE SODIUM 50 MCG PO TABS
50.0000 ug | ORAL_TABLET | Freq: Every day | ORAL | Status: DC
  Administered 2018-08-03 – 2018-08-06 (×4): 50 ug via ORAL
  Filled 2018-08-02 (×4): qty 1

## 2018-08-02 MED ORDER — NICOTINE 21 MG/24HR TD PT24
21.0000 mg | MEDICATED_PATCH | Freq: Every day | TRANSDERMAL | Status: DC
  Administered 2018-08-02 – 2018-08-06 (×5): 21 mg via TRANSDERMAL
  Filled 2018-08-02 (×5): qty 1

## 2018-08-02 MED ORDER — ONDANSETRON HCL 4 MG PO TABS: 4.0000 mg | ORAL_TABLET | Freq: Four times a day (QID) | ORAL | Status: DC | PRN

## 2018-08-02 MED ORDER — ACETAMINOPHEN 325 MG PO TABS: 650.0000 mg | ORAL_TABLET | Freq: Four times a day (QID) | ORAL | Status: DC | PRN

## 2018-08-02 MED ORDER — PROPRANOLOL HCL 20 MG PO TABS
20.0000 mg | ORAL_TABLET | Freq: Two times a day (BID) | ORAL | Status: DC
  Administered 2018-08-03 – 2018-08-06 (×7): 20 mg via ORAL
  Filled 2018-08-02 (×8): qty 1

## 2018-08-02 MED ORDER — ONDANSETRON HCL 4 MG/2ML IJ SOLN: 4.0000 mg | Freq: Four times a day (QID) | INTRAMUSCULAR | Status: DC | PRN

## 2018-08-02 MED ORDER — DIPHENOXYLATE-ATROPINE 2.5-0.025 MG PO TABS: 1.0000 | ORAL_TABLET | Freq: Four times a day (QID) | ORAL | Status: DC | PRN

## 2018-08-02 MED ORDER — ACETAMINOPHEN 650 MG RE SUPP: 650.0000 mg | Freq: Four times a day (QID) | RECTAL | Status: DC | PRN

## 2018-08-02 MED ORDER — ALPRAZOLAM 0.5 MG PO TABS
1.0000 mg | ORAL_TABLET | Freq: Every evening | ORAL | Status: DC | PRN
  Administered 2018-08-02 – 2018-08-05 (×4): 1 mg via ORAL
  Filled 2018-08-02: qty 4
  Filled 2018-08-02 (×2): qty 2
  Filled 2018-08-02: qty 4

## 2018-08-02 MED ORDER — KETOROLAC TROMETHAMINE 15 MG/ML IJ SOLN: 15.0000 mg | Freq: Four times a day (QID) | INTRAMUSCULAR | Status: DC | PRN

## 2018-08-02 MED ORDER — HYDROCODONE-ACETAMINOPHEN 5-325 MG PO TABS
1.0000 | ORAL_TABLET | ORAL | Status: DC | PRN
  Administered 2018-08-02 – 2018-08-05 (×3): 1 via ORAL
  Filled 2018-08-02 (×4): qty 1

## 2018-08-02 MED ORDER — VITAMIN D 25 MCG (1000 UNIT) PO TABS
1000.0000 [IU] | ORAL_TABLET | Freq: Every day | ORAL | Status: DC
  Administered 2018-08-03 – 2018-08-06 (×4): 1000 [IU] via ORAL
  Filled 2018-08-02 (×4): qty 1

## 2018-08-02 NOTE — Plan of Care (Signed)

## 2018-08-02 NOTE — ED Triage Notes (Signed)
Patient sent by Dr. for osteomyelitis, called yesterday and told to come to ED. Patient is a cancer patient. Has esophageal stricture and is scheduled for surgery. Patient reports 2 areas of open wounds to his buttocks.

## 2018-08-02 NOTE — H&P (Addendum)
TRH H&P   Patient Demographics:    Jimmy Tucker, is a 58 y.o. male  MRN: [id number]   DOB - [birth year]  Admit Date - 08/02/2018  Outpatient Primary MD for the patient is Elfredia Nevins, MD  Referring MD: Dr. Estell Harpin  Outpatient Specialists: Oncologist and GI at [facility]  Patient coming from:   Chief Complaint  Patient presents with  . Osteomyelitis       HPI:    Jimmy Tucker  is a 58 y.o. male, with history of GE junction adenocarcinoma status post resection in 2016 (following with oncologist every 6 months at [facility]), history of esophageal stenosis for which he gets periodic EGD with dilation (next appointment within 1 month), peripheral arterial disease with history of critical leg ischemia secondary to occlusion of iliac and femoral artery, paroxysmal A. fib on Xarelto, hypertension, ongoing tobacco use, hyperlipidemia hypothyroidism, coronary artery disease and essential tremor who was sent to the ED by his neurologist for abnormal MRI of the lumbar spine. Patient reports that for the past 6-7 months he was having low back pain occasionally radiating to the left posterior thigh.  The back pain was dull in nature, midline in location and frequently worsened with movement.  Patient reports having frequent falls at home without loss of consciousness, dizziness or sustaining any injury.  He reports having numbness in his left leg which is secondary to his peripheral arterial disease and his left foot is normally cold.  Patient denies any fevers, chills, reports occasional headache but no blurry vision, nausea, vomiting, abdominal pain, dysuria, urinary retention or urgency, urinary or bowel incontinence.  He reports having skin breakdown with pain in his buttocks. Patient reports that pain in his low back has been progressively getting worse. His PCP referred him to  his neurologist recently (home he was seen in the past for essential tremors) and obtain an MRI of the brain and MRI of the lumbar spine.  MRI of the brain was negative for acute findings however MRI of the lumbar spine showed L1-L2 discitis with osteomyelitis.  There is also paravertebral edema extending from T12-L3 with possible small paravertebral collection at L1-L2.  No epidural collection was noted.  No high-grade foraminal or spinal canal stenosis. His neurologist instructed him to come to the ED.  Hospitalist consulted for observation on medical floor.       Review of systems:    In addition to the HPI above,  No Fever-chills, Headache +, No changes with Vision or hearing, No problems swallowing food or Liquids, No Chest pain, Cough or Shortness of Breath, No Abdominal pain, No Nausea or vomiting, Bowel movements are regular, No Blood in stool or Urine, No dysuria, No new skin rashes or bruises, No new joints pains-aches,  Low back pain with numbness in the left lower extremity, occasional radiation of the pain to left  posterior thigh, weakness and frequent falls. No recent weight gain or loss, No polyuria, polydypsia or polyphagia, No significant Mental Stressors.    With Past History of the following :    Past Medical History:  Diagnosis Date  . Abnormal nuclear stress test   . Anxiety   . Cancer (HCC)   . Carotid artery disease (HCC)   . Chronic back pain   . Chronic lower back pain    "L4-5"  . Esophageal cancer (HCC)   . Heavy cigarette smoker (20-39 per day)    Unmotivated  . Hyperlipidemia LDL goal <70    Intolerant to statins  . Hypertension   . Lumbar disc disease   . PAD (peripheral artery disease) (HCC) 10/30/2012   a) 2011 LEA Dopplers: Left ATA occlusion; b) 10/2013: LEA Dopplers - L Common Iliac & CFA occlusion - w/ short reconstitution at the branch point of the external and internal iliac, the external iliac is occluded through the common femoral and  proximal SFA; reconstitutes in the proximal SFA. Two-vessel runoff beyond.; c) s/p LCIA Stent & CFA EA w/ patch angioplasty --> f/u doppler 12/01/2013 patent  . Pollen allergies   . Presence of stent in artery       Past Surgical History:  Procedure Laterality Date  . BACK SURGERY  02/2008  . CARDIAC CATHETERIZATION N/A 02/15/2015   Procedure: Left Heart Cath and Coronary Angiography;  Surgeon: Runell Gess, MD;  Location: [facility] INVASIVE CV LAB;  Service: Cardiovascular;  Laterality: N/A;  . COLONOSCOPY  07/24/2011   Procedure: COLONOSCOPY;  Surgeon: Corbin Ade, MD;  Location: [facility] ENDO SUITE;  Service: Endoscopy;  Laterality: N/A;  8:15  . CORONARY STENT PLACEMENT    . ENDARTERECTOMY FEMORAL Left 11/11/2013   Procedure: Left Femoral Endarterectomy with Patch Angioplasty ;  Surgeon: Chuck Hint, MD;  Location[facility] OR;  Service: Vascular;  Laterality: Left;  . ESOPHAGOGASTRODUODENOSCOPY N/A 10/09/2014   Procedure: ESOPHAGOGASTRODUODENOSCOPY (EGD);  Surgeon: Malissa Hippo, MD;  Location: [facility] ENDO SUITE;  Service: Endoscopy;  Laterality: N/A;  230 - moved to 9:55 - Ann notified pt to arrive at 8:55  . ILIAC ARTERY STENT Left 11/03/2013   Dr. Mariel Aloe  . LOWER EXTREMITY ANGIOGRAM N/A 11/03/2013   Procedure: LOWER EXTREMITY ANGIOGRAM;  Surgeon: Runell Gess, MD;  Location: [facility] CATH LAB;  Service: Cardiovascular;  Laterality: N/A;  . LUMBAR MICRODISCECTOMY Left 02/2008   L4-5  . NM MYOCAR PERF WALL MOTION  07/2009   persantine - normal perfusion  . SURGERY SCROTAL / TESTICULAR Left    cyst excision  . THORACIC ESOPHAGUS REPLACEMENT        Social History:     Social History   Tobacco Use  . Smoking status: Former Smoker    Packs/day: 1.00    Years: 40.00    Pack years: 40.00    Types: Cigarettes    Last attempt to quit: 11/02/2013    Years since quitting: 4.7  . Smokeless tobacco: Never Used  Substance Use Topics  . Alcohol use: Yes    Alcohol/week: 21.0 standard drinks     Types: 21 Cans of beer per week    Comment: every day      Lives -home with wife  Mobility -independent     Family History :     Family History  Problem Relation Age of Onset  . Heart attack Father 17       Cardiac arrest  . Sudden death  Father   . Stroke Brother 108  . Stroke Mother   . Colon cancer Neg Hx       Home Medications:   Prior to Admission medications   Medication Sig Start Date End Date Taking? Authorizing Provider  albuterol (PROAIR HFA) 108 (90 BASE) MCG/ACT inhaler Inhale 2 puffs into the lungs every 4 (four) hours as needed for wheezing or shortness of breath.    Yes [provider]  ALPRAZolam Prudy Feeler) 1 MG tablet Take 1 mg by mouth at bedtime as needed for anxiety.  05/22/11  Yes [provider]  aspirin EC 81 MG tablet Take 81 mg by mouth daily.   Yes [provider]  Cholecalciferol (VITAMIN D3) 2000 units capsule Take 1 capsule by mouth daily.   Yes [provider]  diphenoxylate-atropine (LOMOTIL) 2.5-0.025 MG tablet Take 1 tablet by mouth every 6 (six) hours as needed.   Yes [provider]  folic acid (FOLVITE) 1 MG tablet Take 1 tablet (1 mg total) by mouth daily. 05/01/14  Yes Marykay Lex, MD  levothyroxine (SYNTHROID, LEVOTHROID) 25 MCG tablet Take 2 tablets by mouth daily.   Yes [provider]  losartan (COZAAR) 50 MG tablet Take 0.5 tablets (25 mg total) by mouth daily. 11/04/13  Yes Dwana Melena, PA-C  Oxycodone HCl 20 MG TABS Take 1 tablet (20 mg total) by mouth every 4 (four) hours as needed (pain). 11/11/13  Yes Rhyne, Ames Coupe, PA-C  propranolol (INDERAL) 20 MG tablet Take 20 mg by mouth 2 (two) times daily.    Yes [provider]  rivaroxaban (XARELTO) 20 MG TABS tablet Take 1 tablet (20 mg total) by mouth daily with supper. 12/25/17  Yes Erick Blinks, MD  Tiotropium Bromide-Olodaterol (STIOLTO RESPIMAT) 2.5-2.5 MCG/ACT AERS Inhale 2 puffs into the lungs daily.   Yes  [provider]     Allergies:     Allergies  Allergen Reactions  . Glycopyrrolate Other (See Comments)    Intolerance  . Warfarin And Related Other (See Comments)    Excessive bleeding     Physical Exam:   Vitals  Blood pressure 111/68, pulse 83, temperature 97.8 F (36.6 C), temperature source Oral, resp. rate 18, height 5\' 6"  (1.676 m), weight 69.4 kg, SpO2 100 %.    General: Middle-aged male lying in bed in no acute distress HEENT: Pupils reactive bilateral, EOMI, no pallor, no icterus, moist mucosa, supple neck, no cervical lymphadenopathy Chest: Clear to auscultation bilaterally CVS: Normal S1-S2, no murmurs rub or gallop GI: Soft, nondistended, nontender, bowel sounds present Musculoskeletal: Tender to pressure over the lower lumbar spine medially with some tenderness around the paraspinal area, left foot is cool to palpation but has good distal pulsation, no sacral ulcer or skin breakdown noted. CNS: Alert and oriented, normal tone and strength in bilateral lower extremities, normal sensation.   Data Review:    CBC Recent Labs  Lab 08/02/18 1238  WBC 6.3  HGB 11.7*  HCT 34.5*  PLT 215  MCV 99.4  MCH 33.7  MCHC 33.9  RDW 13.0  LYMPHSABS 1.3  MONOABS 0.4  EOSABS 0.0  BASOSABS 0.0   ------------------------------------------------------------------------------------------------------------------  Chemistries  Recent Labs  Lab 08/02/18 1238  NA 128*  K 3.9  CL 90*  CO2 26  GLUCOSE 107*  BUN 6  CREATININE 0.81  CALCIUM 9.0  AST 31  ALT 17  ALKPHOS 92  BILITOT 0.8   ------------------------------------------------------------------------------------------------------------------ estimated creatinine clearance is 90.8 mL/min (by  C-G formula based on SCr of 0.81 mg/dL). ------------------------------------------------------------------------------------------------------------------ No results for input(s): TSH, T4TOTAL, T3FREE,  THYROIDAB in the last 72 hours.  Invalid input(s): FREET3  Coagulation profile No results for input(s): INR, PROTIME in the last 168 hours. ------------------------------------------------------------------------------------------------------------------- No results for input(s): DDIMER in the last 72 hours. -------------------------------------------------------------------------------------------------------------------  Cardiac Enzymes No results for input(s): CKMB, TROPONINI, MYOGLOBIN in the last 168 hours.  Invalid input(s): CK ------------------------------------------------------------------------------------------------------------------ No results found for: BNP   ---------------------------------------------------------------------------------------------------------------  Urinalysis    Component Value Date/Time   COLORURINE YELLOW 12/23/2017 2120   APPEARANCEUR CLEAR 12/23/2017 2120   LABSPEC 1.002 (L) 12/23/2017 2120   PHURINE 7.0 12/23/2017 2120   GLUCOSEU NEGATIVE 12/23/2017 2120   HGBUR SMALL (A) 12/23/2017 2120   BILIRUBINUR NEGATIVE 12/23/2017 2120   KETONESUR NEGATIVE 12/23/2017 2120   PROTEINUR NEGATIVE 12/23/2017 2120   UROBILINOGEN 1.0 11/11/2013 0618   NITRITE NEGATIVE 12/23/2017 2120   LEUKOCYTESUR NEGATIVE 12/23/2017 2120    ----------------------------------------------------------------------------------------------------------------   Imaging Results:    Mr Brain Wo Contrast  Result Date: 07/31/2018 CLINICAL DATA:  58 y/o M; confusion, tremors, weakness, and falls for 7 months. History of esophageal cancer. EXAM: MRI HEAD WITHOUT CONTRAST TECHNIQUE: Multiplanar, multiecho pulse sequences of the brain and surrounding structures were obtained without intravenous contrast. COMPARISON:  02/11/2007 CT head.  02/11/2007 CT facial. FINDINGS: Brain: No acute infarction, hemorrhage, hydrocephalus, extra-axial collection or mass lesion. Punctate  nonspecific T2 FLAIR hyperintensities in subcortical and periventricular white matter are compatible with mild chronic microvascular ischemic changes. Mild volume loss of the brain. Vascular: Normal flow voids. Skull and upper cervical spine: Normal marrow signal. Sinuses/Orbits: No abnormal signal of paranasal sinuses. Partial opacification of the mastoid air cells. Orbits are unremarkable. 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 IMPRESSION: 1. No acute intracranial abnormality identified. 2. Mild chronic microvascular ischemic changes and volume loss of the brain. 3. 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 The mass is stable by report from 2017 and increased in size from 2008. 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 Differential of lymphoma or lymphadenopathy unlikely. Electronically Signed   By: Mitzi Hansen M.D.   On: 07/31/2018 19:35   Mr Lumbar Spine Wo Contrast  Result Date: 07/31/2018 CLINICAL DATA:  57 y/o M; lower back pain radiating down the left lower extremity. History of surgery in 2008 and esophageal cancer. EXAM: MRI LUMBAR SPINE WITHOUT CONTRAST TECHNIQUE: Multiplanar, multisequence MR imaging of the lumbar spine was performed. No intravenous contrast was administered. COMPARISON:  08/14/2013 lumbar spine MRI. FINDINGS: Segmentation:  Standard. Alignment:  Physiologic. Vertebrae: Diffuse edema throughout the L1 and L2 vertebral bodies, endplate erosive changes, and increased fluid signal within the intervertebral disc space which is collapse when compared with the prior lumbar spine MRI. No loss of vertebral body height. Surrounding paravertebral edema extending from T12-L3. Possible small right-sided paravertebral fluid collection (series 8, image 11 and series 6, image 5). No epidural collection identified. Conus  medullaris and cauda equina: Conus extends to the L1 level. Conus and cauda equina appear normal. Paraspinal and other soft tissues: As above. Disc levels: T12-L1: No significant disc displacement, foraminal stenosis, or canal stenosis. L1-2: No significant disc displacement, foraminal stenosis, or canal stenosis. L2-3: No significant disc displacement, foraminal stenosis, or canal stenosis. L3-4: Mild disc bulge. No significant foraminal or spinal canal stenosis. L4-5: Mild disc  bulge eccentric to the left with small endplate marginal osteophytes and mild facet hypertrophy. Mild left foraminal stenosis. No canal stenosis. L5-S1: No significant disc displacement, foraminal stenosis, or canal stenosis. IMPRESSION: 1. Findings compatible with L1-2 discitis osteomyelitis. 2. Paravertebral edema extending from T12-L3. Possible small right paravertebral collection at L1-2. No epidural collection identified. 3. Mild lumbar spondylosis. No high-grade foraminal or spinal canal stenosis. These results will be called to the ordering clinician or representative by the Radiologist Assistant, and communication documented in the PACS or zVision Dashboard. Electronically Signed   By: Mitzi Hansen M.D.   On: 07/31/2018 19:25    My personal review of EKG: Pending  Assessment & Plan:   Principal problem   Osteomyelitis of vertebra of lumbar region [facility]) Suspect this is chronic with ongoing pain for past several months.  Patient has had microdiscectomy of the L5 in 2009. No clinical or MRI signs and symptoms of spinal stenosis or cord compression. Discussed with IR Dr. Loreta Ave with MRI findings.  He reviewed the images and suggested that there is a very narrow thin of fluid and the biopsy yield would be very low.  I spoke with ID Dr. Algis Liming who recommended that patient would need a disc aspirate. Patient needs to be transferred to [facility] for IR evaluation and disc aspirate. Hold off on antibiotics until IR  aspirate done. Pain control with PRN Vicodin and IV Toradol for severe symptoms. PT evaluation postprocedure. ID will see patient at [facility]. Patient had Xarelto last night and needs to be held for at least 48 hours so IR procedure may likely happen on 2/9.    Active Problems:   PAD (peripheral artery disease) (HCC)   PVD (peripheral vascular disease) (HCC) Continue aspirin.  Follows with Dr. Gery Pray    Hyponatremia Monitor with gentle hydration.  Paroxysmal atrial fibrillation (HCC) Currently sinus on the monitor.  Check EKG.  Holding Xarelto for IR procedure.     Unsteady gait Possibly associated with lumbar osteomyelitis.  PT evaluation.   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 djuvant treatment was not recommended due to the extended recovery time.  Per his oncologist, CT imaging 10/18/17 does not suggest disease recurrence.  He is planned to be evaluated every 6 months with repeat CT.  Also gets evaluated by GI for EGD with dilatation.  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  Continue Lomotil.  Tobacco use Counseled on cessation.  Nicotine patch.  DVT Prophylaxis: On Xarelto, held for IR aspiration  AM Labs Ordered, also please review Full Orders  Family Communication: Admission, patients condition and plan of care including tests being ordered have been discussed with the patient and his wife at bedside Code Status: Full code  Likely DC to home after IR procedure and ID consult  Condition: Fair  Consults called: IR (spoke with Dr. Loreta Ave), ID (spoke with Dr. Algis Liming)  Admission status: Observation (transfer to [facility])  Time spent in minutes : 55   Braydan Marriott M.D on 08/02/2018 at 1:50 PM  Between 7am to  7pm - Pager - [phone]. After 7pm go to [email or web address] - password [facility]  [facility] - Office  [phone]

## 2018-08-02 NOTE — ED Provider Notes (Signed)
[facility] EMERGENCY DEPARTMENT Provider Note   CSN: [id number] Arrival date & time: 08/02/18  1045     History   Chief Complaint Chief Complaint  Patient presents with  . Osteomyelitis     HPI Jimmy Tucker is a 58 y.o. male.  Patient complains of lower back pain for several months that is gotten worse.  He had an MRI of his lumbar spine that showed osteomyelitis at L1-L2  The history is provided by the patient. No language interpreter was used.  Back Pain  Location:  Lumbar spine Quality:  Aching Radiates to:  Does not radiate Pain severity:  Moderate Pain is:  Worse during the day Onset quality:  Gradual Timing:  Constant Progression:  Worsening Chronicity:  New Context: not emotional stress   Relieved by:  Nothing Associated symptoms: no abdominal pain, no chest pain and no headaches     Past Medical History:  Diagnosis Date  . Abnormal nuclear stress test   . Anxiety   . Cancer ([place])   . Carotid artery disease ([place])   . Chronic back pain   . Chronic lower back pain    "L4-5"  . Esophageal cancer ([place])   . Heavy cigarette smoker (20-39 per day)    Unmotivated  . Hyperlipidemia LDL goal <70    Intolerant to statins  . Hypertension   . Lumbar disc disease   . PAD (peripheral artery disease) ([place]) 10/30/2012   a) 2011 LEA Dopplers: Left ATA occlusion; b) 10/2013: LEA Dopplers - L Common Iliac & CFA occlusion - w/ short reconstitution at the branch point of the external and internal iliac, the external iliac is occluded through the common femoral and proximal SFA; reconstitutes in the proximal SFA. Two-vessel runoff beyond.; c) s/p LCIA Stent & CFA EA w/ patch angioplasty --> f/u doppler 12/01/2013 patent  . Pollen allergies   . Presence of stent in artery     Patient Active Problem List   Diagnosis Date Noted  . Low back pain 08/02/2018  . Osteomyelitis of vertebra of lumbar region ([place]) 08/02/2018  . Unsteady gait 08/02/2018  . Coronary artery disease  01/25/2018  . Unspecified atrial fibrillation ([place]) 12/24/2017  . Hypothyroidism 12/24/2017  . Thrombocytopenia ([place]) 12/24/2017  . Alcohol abuse 12/24/2017  . Hyponatremia 12/23/2017  . Bleeding gums 12/23/2017  . Abnormal nuclear stress test 02/09/2015  . PVD (peripheral vascular disease) ([place]) 11/27/2013  . PAD (peripheral artery disease) ([place]) 11/11/2013  . Critical lower limb ischemia - poorly healing ulcer on left foot with occluded iliac, femoral artery on left 10/30/2013  . Other malaise and fatigue 10/30/2013  . Essential hypertension   . Heavy cigarette smoker (20-39 per day)   . Hyperlipidemia LDL goal <70   . Screening for colon cancer 07/07/2011    Past Surgical History:  Procedure Laterality Date  . BACK SURGERY  02/2008  . CARDIAC CATHETERIZATION N/A 02/15/2015   Procedure: Left Heart Cath and Coronary Angiography;  Surgeon: Lorretta Harp, MD;  Location: [facility] CV LAB;  Service: Cardiovascular;  Laterality: N/A;  . COLONOSCOPY  07/24/2011   Procedure: COLONOSCOPY;  Surgeon: Daneil Dolin, MD;  [facility];  Service: Endoscopy;  Laterality: N/A;  8:15  . CORONARY STENT PLACEMENT    . ENDARTERECTOMY FEMORAL Left 11/11/2013   Procedure: Left Femoral Endarterectomy with Patch Angioplasty ;  Surgeon: Angelia Mould, MD;  Location: [place];  Service: Vascular;  Laterality: Left;  . ESOPHAGOGASTRODUODENOSCOPY N/A 10/09/2014  Procedure: ESOPHAGOGASTRODUODENOSCOPY (EGD);  Surgeon: Rogene Houston, MD;  Location[facility];  Service: Endoscopy;  Laterality: N/A;  230 - moved to 9:55 - Ann notified pt to arrive at 8:55  . ILIAC ARTERY STENT Left 11/03/2013   Dr. Judithann Sauger  . LOWER EXTREMITY ANGIOGRAM N/A 11/03/2013   Procedure: LOWER EXTREMITY ANGIOGRAM;  Surgeon: Lorretta Harp, MD;  Location: [facility] CATH LAB;  Service: Cardiovascular;  Laterality: N/A;  . LUMBAR MICRODISCECTOMY Left 02/2008   L4-5  . NM MYOCAR PERF WALL MOTION  07/2009   persantine - normal  perfusion  . SURGERY SCROTAL / TESTICULAR Left    cyst excision  . THORACIC ESOPHAGUS REPLACEMENT          Home Medications    Prior to Admission medications   Medication Sig Start Date End Date Taking? Authorizing Provider  albuterol (PROAIR HFA) 108 (90 BASE) MCG/ACT inhaler Inhale 2 puffs into the lungs every 4 (four) hours as needed for wheezing or shortness of breath.    Yes [provider]  ALPRAZolam Duanne Moron) 1 MG tablet Take 1 mg by mouth at bedtime as needed for anxiety.  05/22/11  Yes [provider]  aspirin EC 81 MG tablet Take 81 mg by mouth daily.   Yes [provider]  Cholecalciferol (VITAMIN D3) 2000 units capsule Take 1 capsule by mouth daily.   Yes [provider]  diphenoxylate-atropine (LOMOTIL) 2.5-0.025 MG tablet Take 1 tablet by mouth every 6 (six) hours as needed.   Yes [provider]  folic acid (FOLVITE) 1 MG tablet Take 1 tablet (1 mg total) by mouth daily. 05/01/14  Yes Leonie Man, MD  levothyroxine (SYNTHROID, LEVOTHROID) 25 MCG tablet Take 2 tablets by mouth daily.   Yes [provider]  losartan (COZAAR) 50 MG tablet Take 0.5 tablets (25 mg total) by mouth daily. 11/04/13  Yes Brett Canales, PA-C  Oxycodone HCl 20 MG TABS Take 1 tablet (20 mg total) by mouth every 4 (four) hours as needed (pain). 11/11/13  Yes Rhyne, Hulen Shouts, PA-C  propranolol (INDERAL) 20 MG tablet Take 20 mg by mouth 2 (two) times daily.    Yes [provider]  rivaroxaban (XARELTO) 20 MG TABS tablet Take 1 tablet (20 mg total) by mouth daily with supper. 12/25/17  Yes Kathie Dike, MD  Tiotropium Bromide-Olodaterol (STIOLTO RESPIMAT) 2.5-2.5 MCG/ACT AERS Inhale 2 puffs into the lungs daily.   Yes [provider]    Family History Family History  Problem Relation Age of Onset  . Heart attack Father 47       Cardiac arrest  . Sudden death Father   . Stroke Brother 44  . Stroke Mother   . Colon cancer Neg  Hx     Social History Social History   Tobacco Use  . Smoking status: Former Smoker    Packs/day: 1.00    Years: 40.00    Pack years: 40.00    Types: Cigarettes    Last attempt to quit: 11/02/2013    Years since quitting: 4.7  . Smokeless tobacco: Never Used  Substance Use Topics  . Alcohol use: Yes    Alcohol/week: 21.0 standard drinks    Types: 21 Cans of beer per week    Comment: every day   . Drug use: No     Allergies   Glycopyrrolate and Warfarin and related   Review of Systems Review of Systems  Constitutional: Negative for appetite change and fatigue.  HENT: Negative for congestion, ear discharge and sinus pressure.   Eyes: Negative for discharge.  Respiratory: Negative for cough.   Cardiovascular: Negative for chest pain.  Gastrointestinal: Negative for abdominal pain and diarrhea.  Genitourinary: Negative for frequency and hematuria.  Musculoskeletal: Positive for back pain.  Skin: Negative for rash.  Neurological: Negative for seizures and headaches.  Psychiatric/Behavioral: Negative for hallucinations.     Physical Exam Updated Vital Signs BP (!) 93/50   Pulse 64   Temp 97.8 F (36.6 C) (Oral)   Resp 18   Ht 5\' 6"  (1.676 m)   Wt 69.4 kg   SpO2 96%   BMI 24.69 kg/m   Physical Exam Constitutional:      Appearance: He is well-developed.  HENT:     Head: Normocephalic.     Nose: Nose normal.  Eyes:     General: No scleral icterus.    Conjunctiva/sclera: Conjunctivae normal.  Neck:     Musculoskeletal: Neck supple.     Thyroid: No thyromegaly.  Cardiovascular:     Rate and Rhythm: Normal rate and regular rhythm.     Heart sounds: No murmur. No friction rub. No gallop.   Pulmonary:     Breath sounds: No stridor. No wheezing or rales.  Chest:     Chest wall: No tenderness.  Abdominal:     General: There is no distension.     Tenderness: There is no abdominal tenderness. There is no rebound.  Musculoskeletal: Normal range of motion.      Comments: Tender lumbar spine  Lymphadenopathy:     Cervical: No cervical adenopathy.  Skin:    Findings: No erythema or rash.  Neurological:     Mental Status: He is alert and oriented to person, place, and time.     Motor: No abnormal muscle tone.     Coordination: Coordination normal.  Psychiatric:        Behavior: Behavior normal.      ED Treatments / Results  Labs (all labs ordered are listed, but only abnormal results are displayed) Labs Reviewed  CBC WITH DIFFERENTIAL/PLATELET - Abnormal; Notable for the following components:      Result Value   RBC 3.47 (*)    Hemoglobin 11.7 (*)    HCT 34.5 (*)    All other components within normal limits  COMPREHENSIVE METABOLIC PANEL - Abnormal; Notable for the following components:   Sodium 128 (*)    Chloride 90 (*)    Glucose, Bld 107 (*)    Albumin 3.3 (*)    All other components within normal limits    EKG None  Radiology Mr Brain Wo Contrast  Result Date: 07/31/2018 CLINICAL DATA:  58 y/o M; confusion, tremors, weakness, and falls for 7 months. History of esophageal cancer. EXAM: MRI HEAD WITHOUT CONTRAST TECHNIQUE: Multiplanar, multiecho pulse sequences of the brain and surrounding structures were obtained without intravenous contrast. COMPARISON:  02/11/2007 CT head.  02/11/2007 CT facial. FINDINGS: Brain: No acute infarction, hemorrhage, hydrocephalus, extra-axial collection or mass lesion. Punctate nonspecific T2 FLAIR hyperintensities in subcortical and periventricular white matter are compatible with mild chronic microvascular ischemic changes. Mild volume loss of the brain. Vascular: Normal flow voids. Skull and upper cervical spine: Normal marrow signal. Sinuses/Orbits: No abnormal signal of paranasal sinuses. Partial opacification of the mastoid air cells. Orbits are unremarkable. 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 IMPRESSION: 1. No acute intracranial abnormality identified. 2. Mild chronic  microvascular ischemic changes  and volume loss of the brain. 3. 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 The mass is stable by report from 2017 and increased in size from 2008. 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 Differential of lymphoma or lymphadenopathy unlikely. Electronically Signed   By: Kristine Garbe M.D.   On: 07/31/2018 19:35   Mr Lumbar Spine Wo Contrast  Result Date: 07/31/2018 CLINICAL DATA:  58 y/o M; lower back pain radiating down the left lower extremity. History of surgery in 2008 and esophageal cancer. EXAM: MRI LUMBAR SPINE WITHOUT CONTRAST TECHNIQUE: Multiplanar, multisequence MR imaging of the lumbar spine was performed. No intravenous contrast was administered. COMPARISON:  08/14/2013 lumbar spine MRI. FINDINGS: Segmentation:  Standard. Alignment:  Physiologic. Vertebrae: Diffuse edema throughout the L1 and L2 vertebral bodies, endplate erosive changes, and increased fluid signal within the intervertebral disc space which is collapse when compared with the prior lumbar spine MRI. No loss of vertebral body height. Surrounding paravertebral edema extending from T12-L3. Possible small right-sided paravertebral fluid collection (series 8, image 11 and series 6, image 5). No epidural collection identified. Conus medullaris and cauda equina: Conus extends to the L1 level. Conus and cauda equina appear normal. Paraspinal and other soft tissues: As above. Disc levels: T12-L1: No significant disc displacement, foraminal stenosis, or canal stenosis. L1-2: No significant disc displacement, foraminal stenosis, or canal stenosis. L2-3: No significant disc displacement, foraminal stenosis, or canal stenosis. L3-4: Mild disc bulge. No significant foraminal or spinal canal stenosis. L4-5: Mild disc bulge eccentric to the left with small endplate  marginal osteophytes and mild facet hypertrophy. Mild left foraminal stenosis. No canal stenosis. L5-S1: No significant disc displacement, foraminal stenosis, or canal stenosis. IMPRESSION: 1. Findings compatible with L1-2 discitis osteomyelitis. 2. Paravertebral edema extending from T12-L3. Possible small right paravertebral collection at L1-2. No epidural collection identified. 3. Mild lumbar spondylosis. No high-grade foraminal or spinal canal stenosis. These results will be called to the ordering clinician or representative by the Radiologist Assistant, and communication documented in the PACS or zVision Dashboard. Electronically Signed   By: Kristine Garbe M.D.   On: 07/31/2018 19:25    Procedures Procedures (including critical care time)  Medications Ordered in ED Medications - No data to display   Initial Impression / Assessment and Plan / ED Course  I have reviewed the triage vital signs and the nursing notes.  Pertinent labs & imaging results that were available during my care of the patient were reviewed by me and considered in my medical decision making (see chart for details).     Patient with osteomyelitis in the lumbar spine.  He will be admitted to medicine  Final Clinical Impressions(s) / ED Diagnoses   Final diagnoses:  Osteomyelitis of lumbar spine [facility])    ED Discharge Orders    None       Milton Ferguson, MD 08/03/18 1246

## 2018-08-02 NOTE — ED Notes (Signed)
ED TO INPATIENT HANDOFF REPORT  Name/Age/Gender Jimmy Tucker 58 y.o. male  Code Status Code Status History    Date Active Date Inactive Code Status Order ID Comments User Context   12/23/2017 2330 12/25/2017 1736 Full Code [id number]  Oswald Hillock, MD Inpatient   02/15/2015 0939 02/15/2015 1712 Full Code [id number]  Lorretta Harp, MD Inpatient   11/11/2013 1705 11/12/2013 1337 Full Code [id number]  Gabriel Earing, PA-C Inpatient   11/03/2013 1019 11/04/2013 1601 Full Code [id number]  Lorretta Harp, MD Inpatient      Home/SNF/Other Home  Chief Complaint ?BONE INFECTION  Level of Care/Admitting Diagnosis ED Disposition    ED Disposition Condition [facility] Area: [place] [100100]  Level of Care: Med-Surg [16]  I expect the patient will be discharged within 24 hours: Yes  LOW acuity---Tx typically complete <24 hrs---ACUTE conditions typically can be evaluated <24 hours---LABS likely to return to acceptable levels <24 hours---IS near functional baseline---EXPECTED to return to current living arrangement---NOT newly hypoxic: Does not meet criteria for 5C-Observation unit  Diagnosis: Osteomyelitis of lumbar spine [facility]) [093818]  Admitting Physician: Louellen Molder [phone]  Attending Physician: Louellen Molder [[id number]]  PT Class (Do Not Modify): Observation [104]  PT Acc Code (Do Not Modify): Observation [10022]       Medical History Past Medical History:  Diagnosis Date  . Abnormal nuclear stress test   . Anxiety   . Cancer ([place])   . Carotid artery disease ([place])   . Chronic back pain   . Chronic lower back pain    "L4-5"  . Esophageal cancer ([place])   . Heavy cigarette smoker (20-39 per day)    Unmotivated  . Hyperlipidemia LDL goal <70    Intolerant to statins  . Hypertension   . Lumbar disc disease   . PAD (peripheral artery disease) ([place]) 10/30/2012   a) 2011 LEA Dopplers: Left ATA occlusion; b) 10/2013: LEA Dopplers - L Common  Iliac & CFA occlusion - w/ short reconstitution at the branch point of the external and internal iliac, the external iliac is occluded through the common femoral and proximal SFA; reconstitutes in the proximal SFA. Two-vessel runoff beyond.; c) s/p LCIA Stent & CFA EA w/ patch angioplasty --> f/u doppler 12/01/2013 patent  . Pollen allergies   . Presence of stent in artery     Allergies Allergies  Allergen Reactions  . Glycopyrrolate Other (See Comments)    Intolerance  . Warfarin And Related Other (See Comments)    Excessive bleeding    IV Location/Drains/Wounds Patient Lines/Drains/Airways Status   Active Line/Drains/Airways    Name:   Placement date:   Placement time:   Site:   Days:   Peripheral IV 08/02/18 Right Antecubital   08/02/18    1244    Antecubital   less than 1   Incision (Closed) 11/11/13 Groin Left   11/11/13    0952     1725   Wound / Incision (Open or Dehisced) 11/11/13 Other (Comment) Toe (Comment  which one) Left arterial   11/11/13    1700    Toe (Comment  which one)   1725   Wound / Incision (Open or Dehisced) 11/11/13 Foot Left;Lateral arterial   11/11/13    1700    Foot   1725          Labs/Imaging Results for orders placed or performed during the hospital encounter of 08/02/18 (from the past 48  hour(s))  CBC with Differential/Platelet     Status: Abnormal   Collection Time: 08/02/18 12:38 PM  Result Value Ref Range   WBC 6.3 4.0 - 10.5 K/uL   RBC 3.47 (L) 4.22 - 5.81 MIL/uL   Hemoglobin 11.7 (L) 13.0 - 17.0 g/dL   HCT 34.5 (L) 39.0 - 52.0 %   MCV 99.4 80.0 - 100.0 fL   MCH 33.7 26.0 - 34.0 pg   MCHC 33.9 30.0 - 36.0 g/dL   RDW 13.0 11.5 - 15.5 %   Platelets 215 150 - 400 K/uL   nRBC 0.0 0.0 - 0.2 %   Neutrophils Relative % 71 %   Neutro Abs 4.5 1.7 - 7.7 K/uL   Lymphocytes Relative 21 %   Lymphs Abs 1.3 0.7 - 4.0 K/uL   Monocytes Relative 6 %   Monocytes Absolute 0.4 0.1 - 1.0 K/uL   Eosinophils Relative 1 %   Eosinophils Absolute 0.0 0.0 - 0.5  K/uL   Basophils Relative 0 %   Basophils Absolute 0.0 0.0 - 0.1 K/uL   Immature Granulocytes 1 %   Abs Immature Granulocytes 0.04 0.00 - 0.07 K/uL    Comment: Performed at [facility], [address], [place], [place] [address]  Comprehensive metabolic panel     Status: Abnormal   Collection Time: 08/02/18 12:38 PM  Result Value Ref Range   Sodium 128 (L) 135 - 145 mmol/L   Potassium 3.9 3.5 - 5.1 mmol/L   Chloride 90 (L) 98 - 111 mmol/L   CO2 26 22 - 32 mmol/L   Glucose, Bld 107 (H) 70 - 99 mg/dL   BUN 6 6 - 20 mg/dL   Creatinine, Ser 0.81 0.61 - 1.24 mg/dL   Calcium 9.0 8.9 - 10.3 mg/dL   Total Protein 7.0 6.5 - 8.1 g/dL   Albumin 3.3 (L) 3.5 - 5.0 g/dL   AST 31 15 - 41 U/L   ALT 17 0 - 44 U/L   Alkaline Phosphatase 92 38 - 126 U/L   Total Bilirubin 0.8 0.3 - 1.2 mg/dL   GFR calc non Af Amer >60 >60 mL/min   GFR calc Af Amer >60 >60 mL/min   Anion gap 12 5 - 15    Comment: Performed at [facility], [address], [place], [place] [address]   Mr Brain 39 Contrast  Result Date: 07/31/2018 CLINICAL DATA:  58 y/o M; confusion, tremors, weakness, and falls for 7 months. History of esophageal cancer. EXAM: MRI HEAD WITHOUT CONTRAST TECHNIQUE: Multiplanar, multiecho pulse sequences of the brain and surrounding structures were obtained without intravenous contrast. COMPARISON:  02/11/2007 CT head.  02/11/2007 CT facial. FINDINGS: Brain: No acute infarction, hemorrhage, hydrocephalus, extra-axial collection or mass lesion. Punctate nonspecific T2 FLAIR hyperintensities in subcortical and periventricular white matter are compatible with mild chronic microvascular ischemic changes. Mild volume loss of the brain. Vascular: Normal flow voids. Skull and upper cervical spine: Normal marrow signal. Sinuses/Orbits: No abnormal signal of paranasal sinuses. Partial opacification of the mastoid air cells. Orbits are unremarkable. 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 IMPRESSION:  1. No acute intracranial abnormality identified. 2. Mild chronic microvascular ischemic changes and volume loss of the brain. 3. 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 The mass is stable by report from 2017 and increased in size from 2008. 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 Differential of lymphoma or lymphadenopathy unlikely. Electronically Signed   By: Kristine Garbe M.D.   On: 07/31/2018 19:35   Mr Lumbar Spine Wo Contrast  Result Date: 07/31/2018 CLINICAL DATA:  58 y/o M; lower back pain radiating down the left lower extremity. History of surgery in 2008 and esophageal cancer. EXAM: MRI LUMBAR SPINE WITHOUT CONTRAST TECHNIQUE: Multiplanar, multisequence MR imaging of the lumbar spine was performed. No intravenous contrast was administered. COMPARISON:  08/14/2013 lumbar spine MRI. FINDINGS: Segmentation:  Standard. Alignment:  Physiologic. Vertebrae: Diffuse edema throughout the L1 and L2 vertebral bodies, endplate erosive changes, and increased fluid signal within the intervertebral disc space which is collapse when compared with the prior lumbar spine MRI. No loss of vertebral body height. Surrounding paravertebral edema extending from T12-L3. Possible small right-sided paravertebral fluid collection (series 8, image 11 and series 6, image 5). No epidural collection identified. Conus medullaris and cauda equina: Conus extends to the L1 level. Conus and cauda equina appear normal. Paraspinal and other soft tissues: As above. Disc levels: T12-L1: No significant disc displacement, foraminal stenosis, or canal stenosis. L1-2: No significant disc displacement, foraminal stenosis, or canal stenosis. L2-3: No significant disc displacement, foraminal stenosis, or canal stenosis. L3-4: Mild disc bulge. No significant foraminal or spinal canal stenosis.  L4-5: Mild disc bulge eccentric to the left with small endplate marginal osteophytes and mild facet hypertrophy. Mild left foraminal stenosis. No canal stenosis. L5-S1: No significant disc displacement, foraminal stenosis, or canal stenosis. IMPRESSION: 1. Findings compatible with L1-2 discitis osteomyelitis. 2. Paravertebral edema extending from T12-L3. Possible small right paravertebral collection at L1-2. No epidural collection identified. 3. Mild lumbar spondylosis. No high-grade foraminal or spinal canal stenosis. These results will be called to the ordering clinician or representative by the Radiologist Assistant, and communication documented in the PACS or zVision Dashboard. Electronically Signed   By: Kristine Garbe M.D.   On: 07/31/2018 19:25    Pending Labs Unresulted Labs (From admission, onward)   None      Vitals/Pain Today's Vitals   08/02/18 1146 08/02/18 1147 08/02/18 1148 08/02/18 1330  BP: 111/68   (!) 93/50  Pulse: 83   64  Resp: 18     Temp: 97.8 F (36.6 C)     TempSrc: Oral     SpO2: 100%   96%  Weight:   69.4 kg   Height:   5\' 6"  (1.676 m)   PainSc:  9       Isolation Precautions No active isolations  Medications Medications - No data to display  Mobility walks

## 2018-08-02 NOTE — ED Notes (Signed)
Pt told R.Holcomb RN, he discussed with doctor about driving himself to [facility]. Dr Clementeen Graham paged to verify.

## 2018-08-03 ENCOUNTER — Inpatient Hospital Stay: Payer: Self-pay

## 2018-08-03 ENCOUNTER — Observation Stay (HOSPITAL_COMMUNITY): Payer: Medicare Other

## 2018-08-03 DIAGNOSIS — M47816 Spondylosis without myelopathy or radiculopathy, lumbar region: Secondary | ICD-10-CM | POA: Diagnosis present

## 2018-08-03 DIAGNOSIS — L89322 Pressure ulcer of left buttock, stage 2: Secondary | ICD-10-CM | POA: Diagnosis present

## 2018-08-03 DIAGNOSIS — Z95828 Presence of other vascular implants and grafts: Secondary | ICD-10-CM | POA: Diagnosis not present

## 2018-08-03 DIAGNOSIS — Z8501 Personal history of malignant neoplasm of esophagus: Secondary | ICD-10-CM | POA: Diagnosis present

## 2018-08-03 DIAGNOSIS — Z7901 Long term (current) use of anticoagulants: Secondary | ICD-10-CM | POA: Diagnosis not present

## 2018-08-03 DIAGNOSIS — Z888 Allergy status to other drugs, medicaments and biological substances status: Secondary | ICD-10-CM

## 2018-08-03 DIAGNOSIS — I739 Peripheral vascular disease, unspecified: Secondary | ICD-10-CM

## 2018-08-03 DIAGNOSIS — G250 Essential tremor: Secondary | ICD-10-CM | POA: Diagnosis present

## 2018-08-03 DIAGNOSIS — K222 Esophageal obstruction: Secondary | ICD-10-CM | POA: Diagnosis not present

## 2018-08-03 DIAGNOSIS — L8990 Pressure ulcer of unspecified site, unspecified stage: Secondary | ICD-10-CM | POA: Diagnosis present

## 2018-08-03 DIAGNOSIS — E871 Hypo-osmolality and hyponatremia: Secondary | ICD-10-CM | POA: Diagnosis present

## 2018-08-03 DIAGNOSIS — K911 Postgastric surgery syndromes: Secondary | ICD-10-CM | POA: Diagnosis present

## 2018-08-03 DIAGNOSIS — Z79899 Other long term (current) drug therapy: Secondary | ICD-10-CM | POA: Diagnosis not present

## 2018-08-03 DIAGNOSIS — G8929 Other chronic pain: Secondary | ICD-10-CM

## 2018-08-03 DIAGNOSIS — Z85028 Personal history of other malignant neoplasm of stomach: Secondary | ICD-10-CM | POA: Diagnosis not present

## 2018-08-03 DIAGNOSIS — I10 Essential (primary) hypertension: Secondary | ICD-10-CM | POA: Diagnosis present

## 2018-08-03 DIAGNOSIS — I2510 Atherosclerotic heart disease of native coronary artery without angina pectoris: Secondary | ICD-10-CM | POA: Diagnosis present

## 2018-08-03 DIAGNOSIS — I480 Paroxysmal atrial fibrillation: Secondary | ICD-10-CM | POA: Diagnosis present

## 2018-08-03 DIAGNOSIS — L89312 Pressure ulcer of right buttock, stage 2: Secondary | ICD-10-CM | POA: Diagnosis present

## 2018-08-03 DIAGNOSIS — E039 Hypothyroidism, unspecified: Secondary | ICD-10-CM | POA: Diagnosis present

## 2018-08-03 DIAGNOSIS — Z7982 Long term (current) use of aspirin: Secondary | ICD-10-CM | POA: Diagnosis not present

## 2018-08-03 DIAGNOSIS — F17210 Nicotine dependence, cigarettes, uncomplicated: Secondary | ICD-10-CM | POA: Diagnosis present

## 2018-08-03 DIAGNOSIS — M4626 Osteomyelitis of vertebra, lumbar region: Principal | ICD-10-CM

## 2018-08-03 DIAGNOSIS — B954 Other streptococcus as the cause of diseases classified elsewhere: Secondary | ICD-10-CM | POA: Diagnosis not present

## 2018-08-03 DIAGNOSIS — K089 Disorder of teeth and supporting structures, unspecified: Secondary | ICD-10-CM | POA: Diagnosis present

## 2018-08-03 DIAGNOSIS — K0889 Other specified disorders of teeth and supporting structures: Secondary | ICD-10-CM | POA: Diagnosis not present

## 2018-08-03 DIAGNOSIS — M4646 Discitis, unspecified, lumbar region: Secondary | ICD-10-CM | POA: Diagnosis not present

## 2018-08-03 DIAGNOSIS — Z79890 Hormone replacement therapy (postmenopausal): Secondary | ICD-10-CM | POA: Diagnosis not present

## 2018-08-03 DIAGNOSIS — E785 Hyperlipidemia, unspecified: Secondary | ICD-10-CM | POA: Diagnosis present

## 2018-08-03 DIAGNOSIS — Z87891 Personal history of nicotine dependence: Secondary | ICD-10-CM

## 2018-08-03 HISTORY — PX: IR FLUORO GUIDED NEEDLE PLC ASPIRATION/INJECTION LOC: IMG2395

## 2018-08-03 LAB — BASIC METABOLIC PANEL
Anion gap: 12 (ref 5–15)
BUN: 6 mg/dL (ref 6–20)
CO2: 27 mmol/L (ref 22–32)
Calcium: 8.8 mg/dL — ABNORMAL LOW (ref 8.9–10.3)
Chloride: 94 mmol/L — ABNORMAL LOW (ref 98–111)
Creatinine, Ser: 1.04 mg/dL (ref 0.61–1.24)
GFR calc Af Amer: >60 mL/min (ref >60)
GFR calc non Af Amer: >60 mL/min (ref >60)
Glucose, Bld: 93 mg/dL (ref 70–99)
Potassium: 3.6 mmol/L (ref 3.5–5.1)
Sodium: 133 mmol/L — ABNORMAL LOW (ref 135–145)

## 2018-08-03 LAB — TSH: TSH: 1.62 u[IU]/mL (ref 0.350–4.500)

## 2018-08-03 LAB — CORTISOL: Cortisol, Plasma: 9.2 ug/dL

## 2018-08-03 LAB — OSMOLALITY: Osmolality: 278 mOsm/kg (ref 275–295)

## 2018-08-03 MED ORDER — MIDAZOLAM HCL 2 MG/2ML IJ SOLN
INTRAMUSCULAR | Status: AC
  Filled 2018-08-03: qty 2

## 2018-08-03 MED ORDER — LIDOCAINE HCL 1 % IJ SOLN
INTRAMUSCULAR | Status: AC
  Filled 2018-08-03: qty 20

## 2018-08-03 MED ORDER — SODIUM CHLORIDE 0.9 % IV SOLN
2.0000 g | Freq: Two times a day (BID) | INTRAVENOUS | Status: DC
  Administered 2018-08-03 – 2018-08-05 (×4): 2 g via INTRAVENOUS
  Filled 2018-08-03 (×5): qty 20

## 2018-08-03 MED ORDER — FENTANYL CITRATE (PF) 100 MCG/2ML IJ SOLN
INTRAMUSCULAR | Status: AC
  Filled 2018-08-03: qty 2

## 2018-08-03 MED ORDER — OXYCODONE HCL 5 MG PO TABS
20.0000 mg | ORAL_TABLET | Freq: Three times a day (TID) | ORAL | Status: DC
  Filled 2018-08-03: qty 4

## 2018-08-03 MED ORDER — MIDAZOLAM HCL 2 MG/2ML IJ SOLN
INTRAMUSCULAR | Status: AC
  Filled 2018-08-03: qty 4

## 2018-08-03 MED ORDER — MIDAZOLAM HCL 2 MG/2ML IJ SOLN
INTRAMUSCULAR | Status: AC | PRN
  Administered 2018-08-03 (×4): 1 mg via INTRAVENOUS

## 2018-08-03 MED ORDER — OXYCODONE HCL 5 MG PO TABS
20.0000 mg | ORAL_TABLET | Freq: Once | ORAL | Status: AC
  Administered 2018-08-03: 20 mg via ORAL
  Filled 2018-08-03: qty 4

## 2018-08-03 MED ORDER — OXYCODONE HCL 5 MG PO TABS
20.0000 mg | ORAL_TABLET | Freq: Three times a day (TID) | ORAL | Status: DC
  Administered 2018-08-03: 20 mg via ORAL

## 2018-08-03 MED ORDER — VANCOMYCIN HCL IN DEXTROSE 1-5 GM/200ML-% IV SOLN
1000.0000 mg | Freq: Two times a day (BID) | INTRAVENOUS | Status: DC
  Administered 2018-08-03 – 2018-08-05 (×4): 1000 mg via INTRAVENOUS
  Filled 2018-08-03 (×6): qty 200

## 2018-08-03 MED ORDER — VANCOMYCIN HCL 10 G IV SOLR
1250.0000 mg | Freq: Once | INTRAVENOUS | Status: AC
  Administered 2018-08-03: 1250 mg via INTRAVENOUS
  Filled 2018-08-03: qty 1250

## 2018-08-03 MED ORDER — OXYCODONE HCL 5 MG PO TABS
20.0000 mg | ORAL_TABLET | Freq: Three times a day (TID) | ORAL | Status: DC
  Administered 2018-08-03 – 2018-08-06 (×9): 20 mg via ORAL
  Filled 2018-08-03 (×9): qty 4

## 2018-08-03 MED ORDER — FENTANYL CITRATE (PF) 100 MCG/2ML IJ SOLN
INTRAMUSCULAR | Status: AC | PRN
  Administered 2018-08-03 (×4): 50 ug via INTRAVENOUS

## 2018-08-03 MED ORDER — LIDOCAINE HCL (PF) 1 % IJ SOLN
INTRAMUSCULAR | Status: AC | PRN
  Administered 2018-08-03: 10 mL
  Administered 2018-08-03: 20 mL

## 2018-08-03 NOTE — Procedures (Signed)
Interventional Radiology Procedure Note  Procedure: fluoro guided L1-L2 disc aspiration. Findings: no fluid.  A sterile saline wash and aspirate was required. Cx sent  Complications: None Recommendations:  - follow up culture - Do not submerge   Signed,  Dulcy Fanny. Earleen Newport, DO

## 2018-08-03 NOTE — Consult Note (Signed)
Chief Complaint: Patient was seen in consultation today for osteomyelitis  Referring Physician(s): Dr. Clementeen Graham  Supervising Physician: Corrie Mckusick  Patient Status: [facility] [place] [facility] [place]  History of Present Illness: Jimmy Tucker is a 58 y.o. male with past medical history of chronic back pain, CAD, PAD who was sent to [facility] ED by his neurologist after MR results shows possible osteomyelitis. Patient is currently admitted awaiting aspiration so that he can start IV abx. ID consult is reportedly pending.    MR Lumbar Spine 07/31/18: 1. Findings compatible with L1-2 discitis osteomyelitis. 2. Paravertebral edema extending from T12-L3. Possible small right paravertebral collection at L1-2. No epidural collection identified. 3. Mild lumbar spondylosis. No high-grade foraminal or spinal canal Stenosis.   Past Medical History:  Diagnosis Date  . Abnormal nuclear stress test   . Anxiety   . Cancer ([place])   . Carotid artery disease ([place])   . Chronic back pain   . Chronic lower back pain    "L4-5"  . Esophageal cancer ([place])   . Heavy cigarette smoker (20-39 per day)    Unmotivated  . Hyperlipidemia LDL goal <70    Intolerant to statins  . Hypertension   . Lumbar disc disease   . PAD (peripheral artery disease) ([place]) 10/30/2012   a) 2011 LEA Dopplers: Left ATA occlusion; b) 10/2013: LEA Dopplers - L Common Iliac & CFA occlusion - w/ short reconstitution at the branch point of the external and internal iliac, the external iliac is occluded through the common femoral and proximal SFA; reconstitutes in the proximal SFA. Two-vessel runoff beyond.; c) s/p LCIA Stent & CFA EA w/ patch angioplasty --> f/u doppler 12/01/2013 patent  . Pollen allergies   . Presence of stent in artery     Past Surgical History:  Procedure Laterality Date  . BACK SURGERY  02/2008  . CARDIAC CATHETERIZATION N/A 02/15/2015   Procedure: Left Heart Cath and Coronary Angiography;  Surgeon: Lorretta Harp, MD;  Location: [place] CV LAB;  Service: Cardiovascular;  Laterality: N/A;  . COLONOSCOPY  07/24/2011   Procedure: COLONOSCOPY;  Surgeon: Daneil Dolin, MD;  [facility];  Service: Endoscopy;  Laterality: N/A;  8:15  . CORONARY STENT PLACEMENT    . ENDARTERECTOMY FEMORAL Left 11/11/2013   Procedure: Left Femoral Endarterectomy with Patch Angioplasty ;  Surgeon: Angelia Mould, MD;  Location: [place];  Service: Vascular;  Laterality: Left;  . ESOPHAGOGASTRODUODENOSCOPY N/A 10/09/2014   Procedure: ESOPHAGOGASTRODUODENOSCOPY (EGD);  Surgeon: Rogene Houston, MD;  [facility];  Service: Endoscopy;  Laterality: N/A;  230 - moved to 9:55 - Ann notified pt to arrive at 8:55  . ILIAC ARTERY STENT Left 11/03/2013   Dr. Judithann Sauger  . LOWER EXTREMITY ANGIOGRAM N/A 11/03/2013   Procedure: LOWER EXTREMITY ANGIOGRAM;  Surgeon: Lorretta Harp, MD;  Location: [facility] CATH LAB;  Service: Cardiovascular;  Laterality: N/A;  . LUMBAR MICRODISCECTOMY Left 02/2008   L4-5  . NM MYOCAR PERF WALL MOTION  07/2009   persantine - normal perfusion  . SURGERY SCROTAL / TESTICULAR Left    cyst excision  . THORACIC ESOPHAGUS REPLACEMENT      Allergies: Glycopyrrolate and Warfarin and related  Medications: Prior to Admission medications   Medication Sig Start Date End Date Taking? Authorizing Provider  albuterol (PROAIR HFA) 108 (90 BASE) MCG/ACT inhaler Inhale 2 puffs into the lungs every 4 (four) hours as needed for wheezing or shortness of breath.  Yes [provider]  ALPRAZolam Duanne Moron) 1 MG tablet Take 1 mg by mouth at bedtime as needed for anxiety.  05/22/11  Yes [provider]  aspirin EC 81 MG tablet Take 81 mg by mouth daily.   Yes [provider]  Cholecalciferol (VITAMIN D3) 2000 units capsule Take 1 capsule by mouth daily.   Yes [provider]  diphenoxylate-atropine (LOMOTIL) 2.5-0.025 MG tablet Take 1 tablet by mouth every 6 (six) hours as needed.   Yes [provider]  folic acid (FOLVITE) 1 MG tablet Take 1 tablet (1 mg total) by mouth daily. 05/01/14  Yes Leonie Man, MD  levothyroxine (SYNTHROID, LEVOTHROID) 25 MCG tablet Take 2 tablets by mouth daily.   Yes [provider]  losartan (COZAAR) 50 MG tablet Take 0.5 tablets (25 mg total) by mouth daily. 11/04/13  Yes Brett Canales, PA-C  Oxycodone HCl 20 MG TABS Take 1 tablet (20 mg total) by mouth every 4 (four) hours as needed (pain). 11/11/13  Yes Rhyne, Hulen Shouts, PA-C  propranolol (INDERAL) 20 MG tablet Take 20 mg by mouth 2 (two) times daily.    Yes [provider]  rivaroxaban (XARELTO) 20 MG TABS tablet Take 1 tablet (20 mg total) by mouth daily with supper. 12/25/17  Yes Kathie Dike, MD  Tiotropium Bromide-Olodaterol (STIOLTO RESPIMAT) 2.5-2.5 MCG/ACT AERS Inhale 2 puffs into the lungs daily.   Yes [provider]     Family History  Problem Relation Age of Onset  . Heart attack Father 64       Cardiac arrest  . Sudden death Father   . Stroke Brother 64  . Stroke Mother   . Colon cancer Neg Hx     Social History   Socioeconomic History  . Marital status: Married    Spouse name: Not on file  . Number of children: Not on file  . Years of education: Not on file  . Highest education level: Not on file  Occupational History  . Occupation: [employer]     Comment: in [place]  . Financial resource strain: Not on file  . Food insecurity:    Worry: Not on file    Inability: Not on file  . Transportation needs:    Medical: Not on file    Non-medical: Not on file  Tobacco Use  . Smoking status: Former Smoker    Packs/day: 1.00    Years: 40.00    Pack years: 40.00    Types: Cigarettes    Last attempt to quit: 11/02/2013    Years since quitting: 4.7  . Smokeless tobacco: Never Used  Substance and Sexual Activity  . Alcohol use: Yes    Alcohol/week: 21.0 standard drinks    Types: 21 Cans of beer per week    Comment: every  day   . Drug use: No  . Sexual activity: Not Currently  Lifestyle  . Physical activity:    Days per week: Not on file    Minutes per session: Not on file  . Stress: Not on file  Relationships  . Social connections:    Talks on phone: Not on file    Gets together: Not on file    Attends religious service: Not on file    Active member of club or organization: Not on file    Attends meetings of clubs or organizations: Not on file    Relationship status: Not on file  Other Topics Concern  .  Not on file  Social History Narrative   Married, still smokes about a pack a day. Unable to work currently because of his claudication.   He does work for [employer] in [place], [place].     Review of Systems: A 12 point ROS discussed and pertinent positives are indicated in the HPI above.  All other systems are negative.  Review of Systems  Constitutional: Negative for fatigue and fever.  Respiratory: Negative for cough and shortness of breath.   Cardiovascular: Negative for chest pain.  Musculoskeletal: Positive for back pain.  Psychiatric/Behavioral: Negative for behavioral problems and confusion.    Vital Signs: BP 118/67 (BP Location: Left Arm)   Pulse 76   Temp 98.4 F (36.9 C) (Oral)   Resp 16   Ht 5\' 6"  (1.676 m)   Wt 153 lb (69.4 kg)   SpO2 99%   BMI 24.69 kg/m   Physical Exam Vitals signs and nursing note reviewed.  Cardiovascular:     Rate and Rhythm: Normal rate. Rhythm irregular.  Pulmonary:     Effort: Pulmonary effort is normal.     Breath sounds: Normal breath sounds.  Skin:    General: Skin is warm and dry.  Neurological:     Mental Status: He is alert and oriented to person, place, and time.  Psychiatric:        Mood and Affect: Mood normal.        Behavior: Behavior normal.        Thought Content: Thought content normal.        Judgment: Judgment normal.      MD Evaluation Airway: WNL Heart: WNL Abdomen: WNL Chest/ Lungs:  WNL ASA  Classification: 3 Mallampati/Airway Score: One   Imaging: Mr Brain Wo Contrast  Result Date: 07/31/2018 CLINICAL DATA:  58 y/o M; confusion, tremors, weakness, and falls for 7 months. History of esophageal cancer. EXAM: MRI HEAD WITHOUT CONTRAST TECHNIQUE: Multiplanar, multiecho pulse sequences of the brain and surrounding structures were obtained without intravenous contrast. COMPARISON:  02/11/2007 CT head.  02/11/2007 CT facial. FINDINGS: Brain: No acute infarction, hemorrhage, hydrocephalus, extra-axial collection or mass lesion. Punctate nonspecific T2 FLAIR hyperintensities in subcortical and periventricular white matter are compatible with mild chronic microvascular ischemic changes. Mild volume loss of the brain. Vascular: Normal flow voids. Skull and upper cervical spine: Normal marrow signal. Sinuses/Orbits: No abnormal signal of paranasal sinuses. Partial opacification of the mastoid air cells. Orbits are unremarkable. 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 IMPRESSION: 1. No acute intracranial abnormality identified. 2. Mild chronic microvascular ischemic changes and volume loss of the brain. 3. 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 The mass is stable by report from 2017 and increased in size from 2008. 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 Differential of lymphoma or lymphadenopathy unlikely. Electronically Signed   By: Kristine Garbe M.D.   On: 07/31/2018 19:35   Mr Lumbar Spine Wo Contrast  Result Date: 07/31/2018 CLINICAL DATA:  58 y/o M; lower back pain radiating down the left lower extremity. History of surgery in 2008 and esophageal cancer. EXAM: MRI LUMBAR SPINE WITHOUT CONTRAST TECHNIQUE: Multiplanar, multisequence MR imaging of the lumbar spine was performed. No intravenous contrast was  administered. COMPARISON:  08/14/2013 lumbar spine MRI. FINDINGS: Segmentation:  Standard. Alignment:  Physiologic. Vertebrae: Diffuse edema throughout the L1 and L2  vertebral bodies, endplate erosive changes, and increased fluid signal within the intervertebral disc space which is collapse when compared with the prior lumbar spine MRI. No loss of vertebral body height. Surrounding paravertebral edema extending from T12-L3. Possible small right-sided paravertebral fluid collection (series 8, image 11 and series 6, image 5). No epidural collection identified. Conus medullaris and cauda equina: Conus extends to the L1 level. Conus and cauda equina appear normal. Paraspinal and other soft tissues: As above. Disc levels: T12-L1: No significant disc displacement, foraminal stenosis, or canal stenosis. L1-2: No significant disc displacement, foraminal stenosis, or canal stenosis. L2-3: No significant disc displacement, foraminal stenosis, or canal stenosis. L3-4: Mild disc bulge. No significant foraminal or spinal canal stenosis. L4-5: Mild disc bulge eccentric to the left with small endplate marginal osteophytes and mild facet hypertrophy. Mild left foraminal stenosis. No canal stenosis. L5-S1: No significant disc displacement, foraminal stenosis, or canal stenosis. IMPRESSION: 1. Findings compatible with L1-2 discitis osteomyelitis. 2. Paravertebral edema extending from T12-L3. Possible small right paravertebral collection at L1-2. No epidural collection identified. 3. Mild lumbar spondylosis. No high-grade foraminal or spinal canal stenosis. These results will be called to the ordering clinician or representative by the Radiologist Assistant, and communication documented in the PACS or zVision Dashboard. Electronically Signed   By: Kristine Garbe M.D.   On: 07/31/2018 19:25    Labs:  CBC: Recent Labs    12/23/17 1946 12/24/17 0506 12/25/17 0454 08/02/18 1238  WBC 4.3 3.1* 3.1* 6.3  HGB 12.9*  11.9* 11.5* 11.7*  HCT 36.7* 34.2* 33.9* 34.5*  PLT 142* 119* 133* 215    COAGS: Recent Labs    12/23/17 1946 12/24/17 0506 12/25/17 0454  INR 4.25* 2.21 1.12    BMP: Recent Labs    12/24/17 0506 12/25/17 0454 08/02/18 1238 08/03/18 0409  NA 123* 128* 128* 133*  K 4.5 4.1 3.9 3.6  CL 86* 95* 90* 94*  CO2 28 23 26 27   GLUCOSE 98 115* 107* 93  BUN 11 13 6 6   CALCIUM 9.0 8.8* 9.0 8.8*  CREATININE 0.77 0.91 0.81 1.04  GFRNONAA >60 >60 >60 >60  GFRAA >60 >60 >60 >60    LIVER FUNCTION TESTS: Recent Labs    12/23/17 1946 12/24/17 0506 12/25/17 0454 08/02/18 1238  BILITOT 1.2 1.3* 0.8 0.8  AST 107* 83* 93* 31  ALT 59* 49* 54* 17  ALKPHOS 99 85 88 92  PROT 7.5 6.8 6.8 7.0  ALBUMIN 3.8 3.4* 3.4* 3.3*    TUMOR MARKERS: No results for input(s): AFPTM, CEA, CA199, CHROMGRNA in the last 8760 hours.  Assessment and Plan: L1-2 discitis osteomyelitis IR consulted for disc aspiration.  ID has not yet seen patient, however plan per discussion with ED MD was for disc aspiration prior to initiation of antibiotics.  Discussed Xarelto with patient.  He states he last took Thursday evening and is very confident in this because Dr. Merlene Laughter wanted him to present to the ED that night however he could not do so.  He held his blood thinner and then presented Friday.   Discussed with Dr. Earleen Newport, plan to proceed with aspiration today.  Patient has been NPO.   Risks and benefits discussed with the patient including bleeding, infection, damage to adjacent structures, and sepsis.  All of the patient's questions were answered, patient is agreeable to proceed. Consent signed and in chart.  Thank you for this interesting consult.  I greatly enjoyed meeting Riyansh Gerstner Tucker and look forward to participating  in their care.  A copy of this report was sent to the requesting provider on this date.  Electronically Signed: Docia Barrier, PA 08/03/2018, 2:01 PM   I spent a total of  40 Minutes    in face to face in clinical consultation, greater than 50% of which was counseling/coordinating care for osteomyelitis.

## 2018-08-03 NOTE — Progress Notes (Signed)
Pt has been complaining of oxycodone not being filled correctly here at the hospital. Pt states he takes the medicine oxycodone 20 mg TID. MD paged and stated for this RN to call and verify with pt's pharmacy [facility] drug pharmacy. This RN called and verified pt last had oxycodone 20 mg filled by Dr. Gerarda Fraction on 07/20/2018. Per MD on call ok to put in verbal order for oxycodone 20 mg TID.

## 2018-08-03 NOTE — Plan of Care (Signed)

## 2018-08-03 NOTE — Progress Notes (Signed)
Pharmacy Antibiotic Note  Jimmy Tucker is a 58 y.o. male admitted on 08/02/2018 with discitis.  Pharmacy has been consulted for vancomycin dosing. WBC wnl. Patient is scheduled for IR today for disc aspiration. Vancomycin to be started after disc aspiration for cultures. SCr 1.04. CrCl ~ 70 mL/min.   Plan: -Vancomycin 1250 mg IV once, then start vancomycin 1 gm IV Q 12 hours -Ceftriaxone per MD  -Monitor CBC, renal fx, cultures and clinical progress -VT at SS   Height: 5\' 6"  (167.6 cm) Weight: 153 lb (69.4 kg) IBW/kg (Calculated) : 63.8  Temp (24hrs), Avg:98.2 F (36.8 C), Min:97.6 F (36.4 C), Max:98.8 F (37.1 C)  Recent Labs  Lab 08/02/18 1238 08/03/18 0409  WBC 6.3  --   CREATININE 0.81 1.04    Estimated Creatinine Clearance: 70.7 mL/min (by C-G formula based on SCr of 1.04 mg/dL).    Allergies  Allergen Reactions  . Glycopyrrolate Other (See Comments)    Intolerance  . Warfarin And Related Other (See Comments)    Excessive bleeding    Antimicrobials this admission: Vancomycin  2/8 >>  Ceftriaxone 2/8>>    Thank you for allowing pharmacy to be a part of this patient's care.  Albertina Parr, PharmD., BCPS Clinical Pharmacist Clinical phone for 08/03/18 until 8:30pm: [phone] If after 8:30pm, please refer to [facility] for unit-specific pharmacist

## 2018-08-03 NOTE — Consult Note (Signed)
[facility] for Infectious Disease    Date of Admission:  08/02/2018          Reason for Consult: Lumbar vertebral infection    Referring Provider: Dr. Dana Allan  Assessment: He has lumbar vertebral infection.  I agree with empiric vancomycin and ceftriaxone pending Gram stain and cultures.  I will go ahead and order PICC placement.  I will follow-up tomorrow.  Plan: 1. Agree with vancomycin and ceftriaxone pending cultures 2. PICC placement 3. Sed rate and C-reactive protein in the morning  Principal Problem:   Osteomyelitis of vertebra of lumbar region [facility]) Active Problems:   Hypertension   Heavy cigarette smoker (20-39 per day)   Hyperlipidemia LDL goal <70   PAD (peripheral artery disease) (HCC)   Hyponatremia   Unspecified atrial fibrillation (HCC)   Hypothyroidism   Coronary artery disease   Unsteady gait   Pressure injury of skin   History of esophageal cancer   Esophageal stenosis   Scheduled Meds: . cholecalciferol  1,000 Units Oral Daily  . fentaNYL      . folic acid  1 mg Oral Daily  . levothyroxine  50 mcg Oral Q0600  . lidocaine      . losartan  25 mg Oral Daily  . midazolam      . nicotine  21 mg Transdermal Daily  . oxyCODONE  20 mg Oral TID  . propranolol  20 mg Oral BID   Continuous Infusions: . sodium chloride 75 mL/hr at 08/03/18 0745  . cefTRIAXone (ROCEPHIN)  IV 2 g (08/03/18 1756)   PRN Meds:.acetaminophen **OR** acetaminophen, albuterol, ALPRAZolam, diphenoxylate-atropine, HYDROcodone-acetaminophen, ketorolac, ondansetron **OR** ondansetron (ZOFRAN) IV  HPI: Jimmy Tucker is a 58 y.o. male with a history of esophageal adenocarcinoma status post resection.  He has esophageal stenosis requiring frequent dilatation and multiple other medical problems including atherosclerotic peripheral vascular disease.  He has a long history of left leg weakness and numbness.  His chronic low back pain that has been gradually worsening  over the past 6 to 7 months.  He rates his recent pain as 9 out of 10.  Had a recent MRI scan which showed evidence of discitis at L1-2, edema extending from T12-L1-3 and a small right paravertebral fluid collection.  IR attempted aspiration today.  He required injection of saline and then aspiration of 1 cc of fluid.  Gram stain is pending.  He has not had any fever, chills or sweats recently.   Review of Systems: Review of Systems  Constitutional: Negative for chills, diaphoresis and fever.  Musculoskeletal: Positive for back pain.    Past Medical History:  Diagnosis Date  . Abnormal nuclear stress test   . Anxiety   . Cancer ([place])   . Carotid artery disease ([place])   . Chronic back pain   . Chronic lower back pain    "L4-5"  . Esophageal cancer ([place])   . Heavy cigarette smoker (20-39 per day)    Unmotivated  . Hyperlipidemia LDL goal <70    Intolerant to statins  . Hypertension   . Lumbar disc disease   . PAD (peripheral artery disease) ([place]) 10/30/2012   a) 2011 LEA Dopplers: Left ATA occlusion; b) 10/2013: LEA Dopplers - L Common Iliac & CFA occlusion - w/ short reconstitution at the branch point of the external and internal iliac, the external iliac is occluded through the common femoral and proximal SFA; reconstitutes in the proximal SFA.  Two-vessel runoff beyond.; c) s/p LCIA Stent & CFA EA w/ patch angioplasty --> f/u doppler 12/01/2013 patent  . Pollen allergies   . Presence of stent in artery     Social History   Tobacco Use  . Smoking status: Former Smoker    Packs/day: 1.00    Years: 40.00    Pack years: 40.00    Types: Cigarettes    Last attempt to quit: 11/02/2013    Years since quitting: 4.7  . Smokeless tobacco: Never Used  Substance Use Topics  . Alcohol use: Yes    Alcohol/week: 21.0 standard drinks    Types: 21 Cans of beer per week    Comment: every day   . Drug use: No    Family History  Problem Relation Age of Onset  . Heart attack Father 61        Cardiac arrest  . Sudden death Father   . Stroke Brother 46  . Stroke Mother   . Colon cancer Neg Hx    Allergies  Allergen Reactions  . Glycopyrrolate Other (See Comments)    Intolerance  . Warfarin And Related Other (See Comments)    Excessive bleeding    OBJECTIVE: Blood pressure (!) 148/74, pulse 73, temperature 98.8 F (37.1 C), temperature source Oral, resp. rate 16, height 5\' 6"  (1.676 m), weight 69.4 kg, SpO2 100 %.  Physical Exam Constitutional:      Comments: He is alert and in no distress sitting up in bed eating dinner.  His wife is visiting.  Cardiovascular:     Rate and Rhythm: Normal rate and regular rhythm.     Heart sounds: No murmur.  Pulmonary:     Effort: Pulmonary effort is normal.     Breath sounds: Normal breath sounds.  Musculoskeletal:     Comments: He appears fairly comfortable moving about in bed.  Psychiatric:     Comments: He became very anxious and even somewhat argumentative as my exam progressed.  He kept telling me that he must leave the hospital tomorrow.     Lab Results Lab Results  Component Value Date   WBC 6.3 08/02/2018   HGB 11.7 (L) 08/02/2018   HCT 34.5 (L) 08/02/2018   MCV 99.4 08/02/2018   PLT 215 08/02/2018    Lab Results  Component Value Date   CREATININE 1.04 08/03/2018   BUN 6 08/03/2018   NA 133 (L) 08/03/2018   K 3.6 08/03/2018   CL 94 (L) 08/03/2018   CO2 27 08/03/2018    Lab Results  Component Value Date   ALT 17 08/02/2018   AST 31 08/02/2018   ALKPHOS 92 08/02/2018   BILITOT 0.8 08/02/2018     Microbiology: No results found for this or any previous visit (from the past 240 hour(s)).  Michel Bickers, MD [facility] for [facility] [phone] pager   [phone] cell 08/03/2018, 6:35 PM

## 2018-08-03 NOTE — Progress Notes (Addendum)
PROGRESS NOTE    Jimmy Tucker  GXQ:[id number] DOB: 09-27-1960 DOA: 08/02/2018 PCP: [name], MD  Outpatient Specialists:   Brief Narrative: Patient is a 58 year old male with past medical history significant for lumbar disc disease, peripheral artery disease, hypertension, hyperlipidemia, tobacco use, esophageal cancer, chronic low back pain, coronary artery disease and anxiety.  Patient has had chronic back pain for a long time, but the back pain has been severe for over 7 months.  Patient was seen at an apparent hospital and had MRI of the brain and a lumbar spine.  MRI of the lumbar spine revealed L5 discitis/osteomyelitis.  Patient was transferred to [facility] for further work-up and management.  The plan is for interventional radiology team to obtain a sample for culture and sensitivity, and then start antibiotics afterwards.  Transferring physician has discussed with infectious disease team.  Patient has just been taken down for interventional radiology trial of aspiration for culture and sensitivity.  Patient was seen alongside patient's wife and nurse.   Assessment & Plan:   Principal Problem:   Osteomyelitis of vertebra of lumbar region [facility]) Active Problems:   Heavy cigarette smoker (20-39 per day)   PAD (peripheral artery disease) (HCC)   PVD (peripheral vascular disease) (HCC)   Hyponatremia   Unspecified atrial fibrillation (HCC)   Low back pain   Unsteady gait   Osteomyelitis of lumbar spine (HCC)   Pressure injury of skin  Osteomyelitis of vertebra of lumbar region [facility]) Suspect this is chronic with ongoing pain for past several months.  Patient has had microdiscectomy of the L5 in 2009. No clinical or MRI signs and symptoms of spinal stenosis or cord compression. Discussed with IR Dr. Earleen Newport with MRI findings.  He reviewed the images and suggested that there is a very narrow thin of fluid and the biopsy yield would be very low.  I spoke with ID Dr. Drucilla Schmidt  who recommended that patient would need a disc aspirate. Patient needs to be transferred to [facility] for IR evaluation and disc aspirate. Hold off on antibiotics until IR aspirate done. Pain control with PRN Vicodin and IV Toradol for severe symptoms. PT evaluation postprocedure. ID will see patient at [facility]. Patient had Xarelto last night and needs to be held for at least 48 hours so IR procedure may likely happen on 2/9. 08/03/2018: Patient has just gone down from aspiration of the L1 for culture and sensitivity.  The plan is to start antibiotics after successful aspiration of sample for culture.  PVD (peripheral vascular disease): Continue aspirin.   Follows with Dr. 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   Check urine sodium, urine osmolality and plasma osmolality.   Cannot rule out component of volume depletion.   Gentle hydration.  Paroxysmal atrial fibrillation (HCC) Currently sinus on the monitor.  Check EKG.  Holding Xarelto for IR procedure.   Unsteady gait Possibly associated with lumbar osteomyelitis.   PT evaluation. 08/03/2018.  Hyponatremia is associated with falls.  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 djuvant treatment was not recommended due to the extended recovery time.  Per his oncologist, CT imaging 10/18/17 does not suggest disease recurrence.  He is planned to be evaluated every 6 months with repeat CT.  Also gets evaluated by GI for EGD with dilatation.  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  Continue Lomotil.  Tobacco use Counseled on cessation.  Nicotine patch.  DVT Prophylaxis: On Xarelto, held for IR aspiration Family Communication:   Wife Code Status: Full code Disposition Plan: Home eventually.   Consultants:   Await infectious disease input.  Procedures:   Aspiration of L1 for culture and sensitivity by interventional radiology.  Antimicrobials:   Start antibiotics after any aspiration.   Subjective: Chronic back pain. No fever or chills.  Objective: Vitals:   08/02/18 1702 08/02/18 2002 08/03/18 0231 08/03/18 0450  BP: (!) 160/82 119/65 130/86 118/67  Pulse: 71 78 (!) 102 76  Resp: 16 16 18 16   Temp: 98.3 F (36.8 C) 98 F (36.7 C) 97.6 F (36.4 C) 98.4 F (36.9 C)  TempSrc: Oral Oral Oral Oral  SpO2: 98% 96% 99% 99%  Weight:      Height:        Intake/Output Summary (Last 24 hours) at 08/03/2018 1334 Last data filed at 08/03/2018 1100 Gross per 24 hour  Intake 698.72 ml  Output 2550 ml  Net -1851.28 ml   Filed Weights   08/02/18 1148  Weight: 69.4 kg    Examination:  General exam: Appears calm and comfortable  Respiratory system: Clear to auscultation.  Cardiovascular system: S1 & S2 heard. Gastrointestinal system: Abdomen is nondistended, soft and nontender. No organomegaly or masses felt. Normal bowel sounds heard. Central nervous system: Alert and oriented. No focal neurological deficits. Extremities: No leg edema.  Data Reviewed: I have personally reviewed following labs and imaging studies  CBC: Recent Labs  Lab 08/02/18 1238  WBC 6.3  NEUTROABS 4.5  HGB 11.7*  HCT 34.5*  MCV 99.4  PLT 681   Basic Metabolic Panel: Recent Labs  Lab 08/02/18 1238 08/03/18 0409  NA 128* 133*  K 3.9 3.6  CL 90* 94*  CO2 26 27  GLUCOSE 107* 93  BUN 6 6  CREATININE 0.81 1.04  CALCIUM 9.0 8.8*   GFR: Estimated Creatinine Clearance: 70.7 mL/min (by C-G formula based on SCr of 1.04 mg/dL). Liver Function Tests: Recent Labs  Lab 08/02/18 1238  AST 31  ALT 17  ALKPHOS 92  BILITOT 0.8  PROT 7.0  ALBUMIN 3.3*   No results for input(s): LIPASE, AMYLASE in the last 168  hours. No results for input(s): AMMONIA in the last 168 hours. Coagulation Profile: No results for input(s): INR, PROTIME in the last 168 hours. Cardiac Enzymes: No results for input(s): CKTOTAL, CKMB, CKMBINDEX, TROPONINI in the last 168 hours. BNP (last 3 results) No results for input(s): PROBNP in the last 8760 hours. HbA1C: No results for input(s): HGBA1C in the last 72 hours. CBG: No results for input(s): GLUCAP in the last 168 hours. Lipid Profile: No results for input(s): CHOL, HDL, LDLCALC, TRIG, CHOLHDL, LDLDIRECT in the last 72 hours. Thyroid Function Tests: No results for input(s): TSH, T4TOTAL, FREET4, T3FREE, THYROIDAB in the last 72 hours. Anemia Panel: No results for input(s): VITAMINB12, FOLATE, FERRITIN, TIBC, IRON, RETICCTPCT in the last 72 hours. Urine analysis:    Component Value Date/Time   COLORURINE YELLOW 12/23/2017 2120   APPEARANCEUR CLEAR 12/23/2017 2120   LABSPEC 1.002 (L) 12/23/2017 2120   PHURINE 7.0 12/23/2017 2120   GLUCOSEU NEGATIVE 12/23/2017 2120   HGBUR SMALL (A) 12/23/2017 2120   BILIRUBINUR NEGATIVE 12/23/2017 2120   KETONESUR NEGATIVE 12/23/2017 2120   PROTEINUR NEGATIVE 12/23/2017 2120   UROBILINOGEN 1.0 11/11/2013 0618   NITRITE NEGATIVE  12/23/2017 2120   LEUKOCYTESUR NEGATIVE 12/23/2017 2120   Sepsis Labs: @LABRCNTIP (procalcitonin:4,lacticidven:4)  )No results found for this or any previous visit (from the past 240 hour(s)).       Radiology Studies: No results found.      Scheduled Meds: . cholecalciferol  1,000 Units Oral Daily  . folic acid  1 mg Oral Daily  . levothyroxine  50 mcg Oral Q0600  . losartan  25 mg Oral Daily  . nicotine  21 mg Transdermal Daily  . oxyCODONE  20 mg Oral TID  . propranolol  20 mg Oral BID   Continuous Infusions: . sodium chloride 75 mL/hr at 08/03/18 0745     LOS: 0 days   Time spent: 35 minutes.  Dana Allan, MD  [facility] Pager #: [phone] 7PM-7AM contact  night coverage as above

## 2018-08-03 NOTE — Progress Notes (Signed)
Pt has questions regarding his IR procedure due to being off xarelto at since 02/06 per MD note pt received xarelto on 02/07. Paged MD to clarify. This RN is awaiting call. Will continue to monitor pt.

## 2018-08-03 NOTE — Progress Notes (Signed)
MD at bedside with pt and IR has spoken with pt.

## 2018-08-04 ENCOUNTER — Encounter (HOSPITAL_COMMUNITY): Payer: Self-pay | Admitting: Interventional Radiology

## 2018-08-04 DIAGNOSIS — Z95828 Presence of other vascular implants and grafts: Secondary | ICD-10-CM

## 2018-08-04 LAB — RENAL FUNCTION PANEL
Albumin: 2.6 g/dL — ABNORMAL LOW (ref 3.5–5.0)
Anion gap: 8 (ref 5–15)
BUN: 5 mg/dL — ABNORMAL LOW (ref 6–20)
CO2: 25 mmol/L (ref 22–32)
Calcium: 8.7 mg/dL — ABNORMAL LOW (ref 8.9–10.3)
Chloride: 101 mmol/L (ref 98–111)
Creatinine, Ser: 0.78 mg/dL (ref 0.61–1.24)
GFR calc Af Amer: >60 mL/min (ref >60)
GFR calc non Af Amer: >60 mL/min (ref >60)
Glucose, Bld: 106 mg/dL — ABNORMAL HIGH (ref 70–99)
Phosphorus: 3 mg/dL (ref 2.5–4.6)
Potassium: 3.8 mmol/L (ref 3.5–5.1)
Sodium: 134 mmol/L — ABNORMAL LOW (ref 135–145)

## 2018-08-04 LAB — C-REACTIVE PROTEIN: CRP: 3 mg/dL — ABNORMAL HIGH (ref <1.0)

## 2018-08-04 LAB — OSMOLALITY: Osmolality: 271 mOsm/kg — ABNORMAL LOW (ref 275–295)

## 2018-08-04 LAB — SEDIMENTATION RATE: Sed Rate: 62 mm/hr — ABNORMAL HIGH (ref 0–16)

## 2018-08-04 MED ORDER — SODIUM CHLORIDE 0.9% FLUSH: 10.0000 mL | INTRAVENOUS | Status: DC | PRN

## 2018-08-04 MED ORDER — SODIUM CHLORIDE 0.9% FLUSH
10.0000 mL | Freq: Two times a day (BID) | INTRAVENOUS | Status: DC
  Administered 2018-08-04 (×2): 10 mL

## 2018-08-04 NOTE — Progress Notes (Signed)
Patient ID: Jimmy Tucker, male   DOB: [birth year], 58 y.o.   MRN: [id number]         [facility]  Date of Admission:  08/02/2018           Day 2 vancomycin        Day 2 ceftriaxone ASSESSMENT: He has acute on chronic low back pain and MRI shows evidence of lumbar vertebral infection.  His initial aspirate yesterday was a dry tap.  A specimen was obtained after injection sterile saline.  Gram stain shows no white blood cells and no organisms.  Cultures are pending.  I will continue vancomycin and ceftriaxone for now.  PLAN: 1. Continue current antibiotics pending culture results  Principal Problem:   Osteomyelitis of vertebra of lumbar region [facility]) Active Problems:   Hypertension   Heavy cigarette smoker (20-39 per day)   Hyperlipidemia LDL goal <70   PAD (peripheral artery disease) (HCC)   Hyponatremia   Unspecified atrial fibrillation (HCC)   Hypothyroidism   Coronary artery disease   Unsteady gait   Pressure injury of skin   History of esophageal cancer   Esophageal stenosis   Scheduled Meds: . cholecalciferol  1,000 Units Oral Daily  . folic acid  1 mg Oral Daily  . levothyroxine  50 mcg Oral Q0600  . losartan  25 mg Oral Daily  . nicotine  21 mg Transdermal Daily  . oxyCODONE  20 mg Oral TID  . propranolol  20 mg Oral BID  . sodium chloride flush  10-40 mL Intracatheter Q12H   Continuous Infusions: . sodium chloride 75 mL/hr at 08/03/18 0745  . cefTRIAXone (ROCEPHIN)  IV 2 g (08/04/18 0455)  . vancomycin 1,000 mg (08/03/18 2253)   PRN Meds:.acetaminophen **OR** acetaminophen, albuterol, ALPRAZolam, diphenoxylate-atropine, HYDROcodone-acetaminophen, ketorolac, ondansetron **OR** ondansetron (ZOFRAN) IV, sodium chloride flush   SUBJECTIVE: He is feeling a little bit better today.  His back pain is under better control and he is not as anxious.  Review of Systems: Review of Systems  Constitutional: Negative for chills, diaphoresis and  fever.  Musculoskeletal: Positive for back pain.    Allergies  Allergen Reactions  . Glycopyrrolate Other (See Comments)    Intolerance  . Warfarin And Related Other (See Comments)    Excessive bleeding    OBJECTIVE: Vitals:   08/03/18 1608 08/03/18 2116 08/04/18 0528 08/04/18 1406  BP: (!) 148/74 (!) 153/74 (!) 155/79 (!) 142/85  Pulse: 73 72 67 63  Resp: 16 16 16    Temp:  97.6 F (36.4 C) 98.6 F (37 C) 98.5 F (36.9 C)  TempSrc:  Oral Oral Oral  SpO2: 100% 100%  100%  Weight:      Height:       Body mass index is 24.69 kg/m.  Physical Exam Constitutional:      Comments: He is talkative and in much better spirits today.  Skin:    Comments: New right arm PICC.     Lab Results Lab Results  Component Value Date   WBC 6.3 08/02/2018   HGB 11.7 (L) 08/02/2018   HCT 34.5 (L) 08/02/2018   MCV 99.4 08/02/2018   PLT 215 08/02/2018    Lab Results  Component Value Date   CREATININE 0.78 08/04/2018   BUN 5 (L) 08/04/2018   NA 134 (L) 08/04/2018   K 3.8 08/04/2018   CL 101 08/04/2018   CO2 25 08/04/2018    Lab Results  Component Value Date  ALT 17 08/02/2018   AST 31 08/02/2018   ALKPHOS 92 08/02/2018   BILITOT 0.8 08/02/2018     Microbiology: Recent Results (from the past 240 hour(s))  Aerobic/Anaerobic Culture (surgical/deep wound)     Status: None (Preliminary result)   Collection Time: 08/03/18  4:06 PM  Result Value Ref Range Status   Specimen Description WOUND VERTEBRA L1 L2  Final   Special Requests   Final    NONE Performed at [facility], [place] [address] [place], [place] [address]    Gram Stain NO WBC SEEN NO ORGANISMS SEEN   Final   Culture CULTURE REINCUBATED FOR BETTER GROWTH  Final   Report Status PENDING  Incomplete    Michel Bickers, MD [facility] [phone] pager   [phone] cell 08/04/2018, 2:12 PM

## 2018-08-04 NOTE — Progress Notes (Signed)
PROGRESS NOTE    Jimmy Tucker  XBJ:[id number] DOB: 1961/02/07 DOA: 08/02/2018 PCP: [name], MD  Outpatient Specialists:   Brief Narrative: Patient is a 58 year old male with past medical history significant for lumbar disc disease, peripheral artery disease, hypertension, hyperlipidemia, tobacco use, esophageal cancer, chronic low back pain, coronary artery disease and anxiety.  Patient has had chronic back pain for a long time, but the back pain has been severe for over 7 months.  Patient was seen at an apparent hospital and had MRI of the brain and a lumbar spine.  MRI of the lumbar spine revealed L5 discitis/osteomyelitis.  Patient was transferred to [facility] for further work-up and management.  The plan is for interventional radiology team to obtain a sample for culture and sensitivity, and then start antibiotics afterwards.  Transferring physician has discussed with infectious disease team.  Patient has just been taken down for interventional radiology trial of aspiration for culture and sensitivity.  Patient was seen alongside patient's wife and nurse.  08/04/2018: Patient seen alongside patient's wife.  Back pain is improved.  Cultures are pending.  PICC line is in place.  Continue IV antibiotics.  Infectious disease input is highly appreciated.  Consult case management for home antibiotics.  Likely discharge back home once home antibiotics is arranged.  Assessment & Plan:   Principal Problem:   Osteomyelitis of vertebra of lumbar region [facility]) Active Problems:   Hypertension   Heavy cigarette smoker (20-39 per day)   Hyperlipidemia LDL goal <70   PAD (peripheral artery disease) (HCC)   Hyponatremia   Unspecified atrial fibrillation (HCC)   Hypothyroidism   Coronary artery disease   Unsteady gait   Pressure injury of skin   History of esophageal cancer   Esophageal stenosis  Osteomyelitis of vertebra of lumbar region [facility]) Suspect this is chronic with ongoing pain  for past several months.  Patient has had microdiscectomy of the L5 in 2009. No clinical or MRI signs and symptoms of spinal stenosis or cord compression. Discussed with IR Dr. Earleen Newport with MRI findings.  He reviewed the images and suggested that there is a very narrow thin of fluid and the biopsy yield would be very low.  I spoke with ID Dr. Drucilla Schmidt who recommended that patient would need a disc aspirate. Patient needs to be transferred to [facility] for IR evaluation and disc aspirate. Hold off on antibiotics until IR aspirate done. Pain control with PRN Vicodin and IV Toradol for severe symptoms. PT evaluation postprocedure. ID will see patient at [facility]. Patient had Xarelto last night and needs to be held for at least 48 hours so IR procedure may likely happen on 2/9. 08/03/2018: Patient has just gone down from aspiration of the L1 for culture and sensitivity.  The plan is to start antibiotics after successful aspiration of sample for culture. 08/04/2018: Patient seen alongside patient's wife.  Back pain is improved.  Cultures are pending.  PICC line is in place.  Continue IV antibiotics.  Infectious disease input is highly appreciated.  Consult case management for home antibiotics.  Likely discharge back home once home antibiotics is arranged.  PVD (peripheral vascular disease): Continue aspirin.   Follows with Dr. 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   Check urine sodium, urine osmolality and plasma osmolality.   Cannot rule out component of volume depletion.   Gentle hydration. 08/04/2018: Repeat urine sodium and osmolality.  Repeat plasma osmolality.  Hyponatremia  is resolving.  Paroxysmal atrial fibrillation (HCC) Currently sinus on the monitor.  Check EKG.  Holding Xarelto for IR procedure.  Unsteady gait Possibly associated with lumbar osteomyelitis.   PT evaluation. 08/03/2018.  Hyponatremia can be associated with  falls.  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 djuvant treatment was not recommended due to the extended recovery time.  Per his oncologist, CT imaging 10/18/17 does not suggest disease recurrence.  He is planned to be evaluated every 6 months with repeat CT.  Also gets evaluated by GI for EGD with dilatation.  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  Continue Lomotil.  Tobacco use Counseled on cessation.  Nicotine patch.  DVT Prophylaxis: On Xarelto, held for IR aspiration Family Communication:  Wife Code Status: Full code Disposition Plan: Home eventually.   Consultants:   Await infectious disease input.  Procedures:   Aspiration of L1 for culture and sensitivity by interventional radiology.  Antimicrobials:   Start antibiotics after any aspiration.   Subjective: Chronic back pain. No fever or chills.  Objective: Vitals:   08/03/18 1608 08/03/18 2116 08/04/18 0528 08/04/18 1406  BP: (!) 148/74 (!) 153/74 (!) 155/79 (!) 142/85  Pulse: 73 72 67 63  Resp: 16 16 16    Temp:  97.6 F (36.4 C) 98.6 F (37 C) 98.5 F (36.9 C)  TempSrc:  Oral Oral Oral  SpO2: 100% 100%  100%  Weight:      Height:        Intake/Output Summary (Last 24 hours) at 08/04/2018 1628 Last data filed at 08/04/2018 1500 Gross per 24 hour  Intake 920 ml  Output 1300 ml  Net -380 ml   Filed Weights   08/02/18 1148  Weight: 69.4 kg    Examination:  General exam: Appears calm and comfortable  Respiratory system: Clear to auscultation.  Cardiovascular system: S1 & S2 heard. Gastrointestinal system: Abdomen is nondistended, soft and nontender. No organomegaly or masses felt. Normal bowel sounds heard. Central nervous system: Alert and oriented.  No focal neurological deficits. Extremities: No leg edema.  Data Reviewed: I have personally reviewed following labs and imaging studies  CBC: Recent Labs  Lab 08/02/18 1238  WBC 6.3  NEUTROABS 4.5  HGB 11.7*  HCT 34.5*  MCV 99.4  PLT 539   Basic Metabolic Panel: Recent Labs  Lab 08/02/18 1238 08/03/18 0409 08/04/18 0417  NA 128* 133* 134*  K 3.9 3.6 3.8  CL 90* 94* 101  CO2 26 27 25   GLUCOSE 107* 93 106*  BUN 6 6 5*  CREATININE 0.81 1.04 0.78  CALCIUM 9.0 8.8* 8.7*  PHOS  --   --  3.0   GFR: Estimated Creatinine Clearance: 91.9 mL/min (by C-G formula based on SCr of 0.78 mg/dL). Liver Function Tests: Recent Labs  Lab 08/02/18 1238 08/04/18 0417  AST 31  --   ALT 17  --   ALKPHOS 92  --   BILITOT 0.8  --   PROT 7.0  --   ALBUMIN 3.3* 2.6*   No results for input(s): LIPASE, AMYLASE in the last 168 hours. No results for input(s): AMMONIA in the last 168 hours. Coagulation Profile: No results for input(s): INR, PROTIME in the last 168 hours. Cardiac Enzymes: No results for input(s): CKTOTAL, CKMB, CKMBINDEX, TROPONINI in the last 168 hours. BNP (last 3 results)  No results for input(s): PROBNP in the last 8760 hours. HbA1C: No results for input(s): HGBA1C in the last 72 hours. CBG: No results for input(s): GLUCAP in the last 168 hours. Lipid Profile: No results for input(s): CHOL, HDL, LDLCALC, TRIG, CHOLHDL, LDLDIRECT in the last 72 hours. Thyroid Function Tests: Recent Labs    08/03/18 1613  TSH 1.620   Anemia Panel: No results for input(s): VITAMINB12, FOLATE, FERRITIN, TIBC, IRON, RETICCTPCT in the last 72 hours. Urine analysis:    Component Value Date/Time   COLORURINE YELLOW 12/23/2017 2120   APPEARANCEUR CLEAR 12/23/2017 2120   LABSPEC 1.002 (L) 12/23/2017 2120   PHURINE 7.0 12/23/2017 2120   GLUCOSEU NEGATIVE 12/23/2017 2120   HGBUR SMALL (A) 12/23/2017 2120   BILIRUBINUR NEGATIVE 12/23/2017 2120   KETONESUR NEGATIVE 12/23/2017 2120    PROTEINUR NEGATIVE 12/23/2017 2120   UROBILINOGEN 1.0 11/11/2013 0618   NITRITE NEGATIVE 12/23/2017 2120   LEUKOCYTESUR NEGATIVE 12/23/2017 2120   Sepsis Labs: @LABRCNTIP (procalcitonin:4,lacticidven:4)  ) Recent Results (from the past 240 hour(s))  Aerobic/Anaerobic Culture (surgical/deep wound)     Status: None (Preliminary result)   Collection Time: 08/03/18  4:06 PM  Result Value Ref Range Status   Specimen Description WOUND VERTEBRA L1 L2  Final   Special Requests   Final    NONE Performed at [facility], [place] [address] [place], [place] [address]    Gram Stain NO WBC SEEN NO ORGANISMS SEEN   Final   Culture CULTURE REINCUBATED FOR BETTER GROWTH  Final   Report Status PENDING  Incomplete         Radiology Studies: Ir Fluoro Guide Ndl Plmt / Bx  Result Date: 08/04/2018 INDICATION: 58 year old male with a history of L1-L2 osteomyelitis EXAM: IMAGE GUIDED ASPIRATE MEDICATIONS: None ANESTHESIA/SEDATION: Fentanyl 200 mcg IV; Versed 4.0 mg IV Moderate Sedation Time:  12 minutes The patient was continuously monitored during the procedure by the interventional radiology nurse under my direct supervision. COMPLICATIONS: None PROCEDURE: Informed written consent was obtained from the patient after a thorough discussion of the procedural risks, benefits and alternatives. All questions were addressed. Maximal Sterile Barrier Technique was utilized including caps, mask, sterile gowns, sterile gloves, sterile drape, hand hygiene and skin antiseptic. A timeout was performed prior to the initiation of the procedure. Patient position prone position on fluoroscopy table. Lumbar region was prepped and draped in the usual sterile fashion. 1% lidocaine was used for local anesthesia. Using x-ray guidance, 17 gauge guide needle was advanced into the anterior 1/3 of the disc space at the L1-L2 level from a right flank approach, targeting the x-ray correlate of findings from prior MR. Aspirate was  attempted with no fluid returned. Sterile saline wash was infused with the aspirate retrieved. Patient tolerated the procedure well and remained hemodynamically stable throughout. No complications were encountered and no significant blood loss. IMPRESSION: Status post fluoroscopic guided aspirate attempt of L1-L2 disc space. No native fluid was retrieved, and so approximately 3 cc of sterile saline wash was used as aspirate attempt. Signed, Dulcy Fanny. Dellia Nims, RPVI [facility] Electronically Signed   By: Corrie Mckusick D.O.   On: 08/04/2018 08:14   [place] Ekg Site Rite  Result Date: 08/03/2018 If Site Rite image not attached, placement could not be confirmed due to current cardiac rhythm.       Scheduled Meds: . cholecalciferol  1,000 Units Oral Daily  . folic acid  1 mg Oral Daily  .  levothyroxine  50 mcg Oral Q0600  . losartan  25 mg Oral Daily  . nicotine  21 mg Transdermal Daily  . oxyCODONE  20 mg Oral TID  . propranolol  20 mg Oral BID  . sodium chloride flush  10-40 mL Intracatheter Q12H   Continuous Infusions: . sodium chloride 75 mL/hr at 08/03/18 0745  . cefTRIAXone (ROCEPHIN)  IV 2 g (08/04/18 0455)  . vancomycin 1,000 mg (08/04/18 1556)     LOS: 1 day   Time spent: 25 minutes.  Dana Allan, MD  [facility] Pager #: [phone] 7PM-7AM contact night coverage as above

## 2018-08-04 NOTE — Progress Notes (Signed)
Peripherally Inserted Central Catheter/Midline Placement  The IV Nurse has discussed with the patient and/or persons authorized to consent for the patient, the purpose of this procedure and the potential benefits and risks involved with this procedure.  The benefits include less needle sticks, lab draws from the catheter, and the patient may be discharged home with the catheter. Risks include, but not limited to, infection, bleeding, blood clot (thrombus formation), and puncture of an artery; nerve damage and irregular heartbeat and possibility to perform a PICC exchange if needed/ordered by physician.  Alternatives to this procedure were also discussed.  Bard Power PICC patient education guide, fact sheet on infection prevention and patient information card has been provided to patient /or left at bedside.    PICC/Midline Placement Documentation  PICC Single Lumen 08/04/18 PICC Right Cephalic 38 cm 0 cm (Active)  Indication for Insertion or Continuance of Line Home intravenous therapies (PICC only) 08/04/2018  8:56 AM  Exposed Catheter (cm) 0 cm 08/04/2018  8:56 AM  Site Assessment Clean;Dry;Intact 08/04/2018  8:56 AM  Line Status Flushed;Saline locked;Blood return noted 08/04/2018  8:56 AM  Dressing Type Transparent 08/04/2018  8:56 AM  Dressing Status Clean;Intact;Dry;Antimicrobial disc in place 08/04/2018  8:56 AM  Line Care Connections checked and tightened;Tubing changed 08/04/2018  8:56 AM  Line Adjustment (NICU/IV Team Only) No 08/04/2018  8:56 AM  Dressing Intervention New dressing 08/04/2018  8:56 AM  Dressing Change Due 08/11/18 08/04/2018  8:56 AM       Rolena Infante 08/04/2018, 8:57 AM

## 2018-08-05 DIAGNOSIS — B954 Other streptococcus as the cause of diseases classified elsewhere: Secondary | ICD-10-CM

## 2018-08-05 DIAGNOSIS — K0889 Other specified disorders of teeth and supporting structures: Secondary | ICD-10-CM

## 2018-08-05 DIAGNOSIS — M4646 Discitis, unspecified, lumbar region: Secondary | ICD-10-CM

## 2018-08-05 LAB — RENAL FUNCTION PANEL
Albumin: 2.7 g/dL — ABNORMAL LOW (ref 3.5–5.0)
Anion gap: 8 (ref 5–15)
BUN: 5 mg/dL — ABNORMAL LOW (ref 6–20)
CO2: 24 mmol/L (ref 22–32)
Calcium: 8.6 mg/dL — ABNORMAL LOW (ref 8.9–10.3)
Chloride: 103 mmol/L (ref 98–111)
Creatinine, Ser: 0.84 mg/dL (ref 0.61–1.24)
GFR calc Af Amer: >60 mL/min (ref >60)
GFR calc non Af Amer: >60 mL/min (ref >60)
Glucose, Bld: 104 mg/dL — ABNORMAL HIGH (ref 70–99)
Phosphorus: 3.2 mg/dL (ref 2.5–4.6)
Potassium: 3.3 mmol/L — ABNORMAL LOW (ref 3.5–5.1)
Sodium: 135 mmol/L (ref 135–145)

## 2018-08-05 MED ORDER — SODIUM CHLORIDE 0.9 % IV SOLN
2.0000 g | INTRAVENOUS | Status: DC
  Administered 2018-08-06: 2 g via INTRAVENOUS
  Filled 2018-08-05: qty 20

## 2018-08-05 MED ORDER — OXYCODONE HCL 5 MG PO TABS
5.0000 mg | ORAL_TABLET | Freq: Four times a day (QID) | ORAL | Status: DC | PRN
  Administered 2018-08-06 (×2): 5 mg via ORAL
  Filled 2018-08-05 (×2): qty 1

## 2018-08-05 NOTE — Plan of Care (Signed)
  Problem: Safety: Goal: Ability to remain free from injury will improve Outcome: Progressing   

## 2018-08-05 NOTE — Progress Notes (Signed)
PROGRESS NOTE    Jimmy Tucker  CHY:[id number] DOB: 1961-03-27 DOA: 08/02/2018 PCP: [name], MD  Outpatient Specialists:   Brief Narrative: Patient is a 58 year old male with past medical history significant for lumbar disc disease, peripheral artery disease, hypertension, hyperlipidemia, tobacco use, esophageal cancer, chronic low back pain, coronary artery disease and anxiety.  Patient has had chronic back pain for a long time, but the back pain has been severe for over 7 months.  Patient was seen at an apparent hospital and had MRI of the brain and a lumbar spine.  MRI of the lumbar spine revealed L5 discitis/osteomyelitis.  Patient was transferred to [facility] for further work-up and management.  The plan is for interventional radiology team to obtain a sample for culture and sensitivity, and then start antibiotics afterwards.  Transferring physician has discussed with infectious disease team.  Patient has just been taken down for interventional radiology trial of aspiration for culture and sensitivity.  Patient was seen alongside patient's wife and nurse.  08/04/2018: Patient seen alongside patient's wife.  Back pain is improved.  Cultures are pending.  PICC line is in place.  Continue IV antibiotics.  Infectious disease input is highly appreciated.  Consult case management for home antibiotics.  Likely discharge back home once home antibiotics is arranged.  08/05/2018: Patient seen alongside patient's wife.  Discussed extensively with the patient, patient's wife in the presence of the patient's nurse.  Patient is eager to be discharged back home.  I have explained to the patient and the wife that there is absolute need for patient to wait until work-up is completed prior to discharge.  Patient's disease team will advise on timing of discharge.,  Continue IV antibiotics for now.  Follow final cultures.  Assessment & Plan:   Principal Problem:   Osteomyelitis of vertebra of lumbar  region [facility]) Active Problems:   Hypertension   Heavy cigarette smoker (20-39 per day)   Hyperlipidemia LDL goal <70   PAD (peripheral artery disease) (HCC)   Hyponatremia   Unspecified atrial fibrillation (HCC)   Hypothyroidism   Coronary artery disease   Unsteady gait   Pressure injury of skin   History of esophageal cancer   Esophageal stenosis  Osteomyelitis of vertebra of lumbar region [facility]) Suspect this is chronic with ongoing pain for past several months.  Patient has had microdiscectomy of the L5 in 2009. No clinical or MRI signs and symptoms of spinal stenosis or cord compression. Discussed with IR Dr. Earleen Newport with MRI findings.  He reviewed the images and suggested that there is a very narrow thin of fluid and the biopsy yield would be very low.  I spoke with ID Dr. Drucilla Schmidt who recommended that patient would need a disc aspirate. Patient needs to be transferred to [facility] for IR evaluation and disc aspirate. Hold off on antibiotics until IR aspirate done. Pain control with PRN Vicodin and IV Toradol for severe symptoms. PT evaluation postprocedure. ID will see patient at [facility]. Patient had Xarelto last night and needs to be held for at least 48 hours so IR procedure may likely happen on 2/9. 08/03/2018: Patient has just gone down from aspiration of the L1 for culture and sensitivity.  The plan is to start antibiotics after successful aspiration of sample for culture. 08/04/2018: Patient seen alongside patient's wife.  Back pain is improved.  Cultures are pending.  PICC line is in place.  Continue IV antibiotics.  Infectious disease input is highly appreciated.  Consult case management for home antibiotics.  Likely discharge back home once home antibiotics is arranged. 08/05/2018: Patient seen alongside patient's wife.  Discussed extensively with the patient, patient's wife in the presence of the patient's nurse.  Patient is eager to be discharged back home.  I have explained to  the patient and the wife that there is absolute need for patient to wait until work-up is completed prior to discharge.  Patient's disease team will advise on timing of discharge.,  Continue IV antibiotics for now.  Follow final cultures.  PVD (peripheral vascular disease): Continue aspirin.   Follows with Dr. 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   Check urine sodium, urine osmolality and plasma osmolality.   Cannot rule out component of volume depletion.   Gentle hydration. 08/04/2018: Repeat urine sodium and osmolality.  Repeat plasma osmolality.  Hyponatremia is resolving.  04/15/2019: Sodium is 135 today.  Improving.  Paroxysmal atrial fibrillation (HCC) Currently sinus on the monitor.  Check EKG.  Holding Xarelto for IR procedure.  Unsteady gait Possibly associated with lumbar osteomyelitis.   PT evaluation. 08/03/2018.  Hyponatremia can be associated with falls.  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 djuvant treatment was not recommended due to the extended recovery time.  Per his oncologist, CT imaging 10/18/17 does not suggest disease recurrence.  He is planned to be evaluated every 6 months with repeat CT.  Also gets evaluated by GI for EGD with dilatation.  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  Continue Lomotil.  Tobacco use Counseled on cessation.  Nicotine patch.  DVT Prophylaxis: On Xarelto, held for IR aspiration Family Communication:  Wife Code Status: Full code Disposition Plan: Home eventually.   Consultants:   Infectious disease  Procedures:   Aspiration of L1 for culture and sensitivity by interventional radiology.  Antimicrobials:   IV  vancomycin.  IV Rocephin   Subjective: Back pain has improved.   No fever or chills.  Objective: Vitals:   08/04/18 2225 08/05/18 0525 08/05/18 0945 08/05/18 1546  BP: 107/83 (!) 155/66 (!) 190/92 (!) 171/96  Pulse: 78 67 93 79  Resp:   16 16  Temp: (!) 97.2 F (36.2 C) (!) 97.5 F (36.4 C) 97.7 F (36.5 C)   TempSrc: Oral Oral Oral   SpO2: 100% 99% 100% 100%  Weight:      Height:        Intake/Output Summary (Last 24 hours) at 08/05/2018 1652 Last data filed at 08/05/2018 1500 Gross per 24 hour  Intake 3407.57 ml  Output 2775 ml  Net 632.57 ml   Filed Weights   08/02/18 1148  Weight: 69.4 kg    Examination:  General exam: Appears calm and comfortable  Respiratory system: Clear to auscultation.  Cardiovascular system: S1 & S2 heard. Gastrointestinal system: Abdomen is nondistended, soft and nontender. No organomegaly or masses felt. Normal bowel sounds heard. Central nervous system: Alert and oriented. No focal neurological deficits. Extremities: No leg edema.  Data Reviewed: I have personally reviewed following labs and imaging studies  CBC: Recent Labs  Lab 08/02/18 1238  WBC 6.3  NEUTROABS 4.5  HGB 11.7*  HCT 34.5*  MCV 99.4  PLT 962   Basic Metabolic Panel: Recent Labs  Lab 08/02/18 1238 08/03/18 0409 08/04/18 0417 08/05/18 0340  NA  128* 133* 134* 135  K 3.9 3.6 3.8 3.3*  CL 90* 94* 101 103  CO2 26 27 25 24   GLUCOSE 107* 93 106* 104*  BUN 6 6 5* 5*  CREATININE 0.81 1.04 0.78 0.84  CALCIUM 9.0 8.8* 8.7* 8.6*  PHOS  --   --  3.0 3.2   GFR: Estimated Creatinine Clearance: 87.6 mL/min (by C-G formula based on SCr of 0.84 mg/dL). Liver Function Tests: Recent Labs  Lab 08/02/18 1238 08/04/18 0417 08/05/18 0340  AST 31  --   --   ALT 17  --   --   ALKPHOS 92  --   --   BILITOT 0.8  --   --   PROT 7.0  --   --   ALBUMIN 3.3* 2.6* 2.7*   No results for input(s): LIPASE, AMYLASE in the last 168 hours. No results for input(s): AMMONIA  in the last 168 hours. Coagulation Profile: No results for input(s): INR, PROTIME in the last 168 hours. Cardiac Enzymes: No results for input(s): CKTOTAL, CKMB, CKMBINDEX, TROPONINI in the last 168 hours. BNP (last 3 results) No results for input(s): PROBNP in the last 8760 hours. HbA1C: No results for input(s): HGBA1C in the last 72 hours. CBG: No results for input(s): GLUCAP in the last 168 hours. Lipid Profile: No results for input(s): CHOL, HDL, LDLCALC, TRIG, CHOLHDL, LDLDIRECT in the last 72 hours. Thyroid Function Tests: Recent Labs    08/03/18 1613  TSH 1.620   Anemia Panel: No results for input(s): VITAMINB12, FOLATE, FERRITIN, TIBC, IRON, RETICCTPCT in the last 72 hours. Urine analysis:    Component Value Date/Time   COLORURINE YELLOW 12/23/2017 2120   APPEARANCEUR CLEAR 12/23/2017 2120   LABSPEC 1.002 (L) 12/23/2017 2120   PHURINE 7.0 12/23/2017 2120   GLUCOSEU NEGATIVE 12/23/2017 2120   HGBUR SMALL (A) 12/23/2017 2120   BILIRUBINUR NEGATIVE 12/23/2017 2120   KETONESUR NEGATIVE 12/23/2017 2120   PROTEINUR NEGATIVE 12/23/2017 2120   UROBILINOGEN 1.0 11/11/2013 0618   NITRITE NEGATIVE 12/23/2017 2120   LEUKOCYTESUR NEGATIVE 12/23/2017 2120   Sepsis Labs: @LABRCNTIP (procalcitonin:4,lacticidven:4)  ) Recent Results (from the past 240 hour(s))  Aerobic/Anaerobic Culture (surgical/deep wound)     Status: None (Preliminary result)   Collection Time: 08/03/18  4:06 PM  Result Value Ref Range Status   Specimen Description WOUND VERTEBRA L1 L2  Final   Special Requests   Final    NONE Performed at [facility], [place] [address] [place], [place] [address]    Gram Stain NO WBC SEEN NO ORGANISMS SEEN   Final   Culture   Final    FEW VIRIDANS STREPTOCOCCUS NO ANAEROBES ISOLATED; CULTURE IN PROGRESS FOR 5 DAYS    Report Status PENDING  Incomplete         Radiology Studies: [place] Ekg Site Rite  Result Date: 08/03/2018 If Site Rite image not attached,  placement could not be confirmed due to current cardiac rhythm.       Scheduled Meds: . cholecalciferol  1,000 Units Oral Daily  . folic acid  1 mg Oral Daily  . levothyroxine  50 mcg Oral Q0600  . losartan  25 mg Oral Daily  . nicotine  21 mg Transdermal Daily  . oxyCODONE  20 mg Oral TID  . propranolol  20 mg Oral BID  . sodium chloride flush  10-40 mL Intracatheter Q12H   Continuous Infusions: . sodium chloride 10 mL/hr at 08/04/18 1734  . [START ON 08/06/2018] cefTRIAXone (  ROCEPHIN)  IV       LOS: 2 days   Time spent: 25 minutes.  Dana Allan, MD  [facility] Pager #: [phone] 7PM-7AM contact night coverage as above

## 2018-08-05 NOTE — Progress Notes (Signed)
[facility] for Infectious Disease  Date of Admission:  08/02/2018     Total days of antibiotics 3          ASSESSMENT/PLAN  Mr. [place] has Viridans streptococcus L1-L2 discitis/osteomyeltiis with susceptibilities remaining pending. He will require at least 6 weeks of IV antimicrobial therapy and will continue with ceftriaxone and discontinue vancomycin. Most recent CRP is 3 with ESR of 62. PICC line without evidence of infection.  1. Continue current dose of ceftriaxone.  2. Monitor cultures for susceptitiblites.   Principal Problem:   Osteomyelitis of vertebra of lumbar region [facility]) Active Problems:   Hypertension   Heavy cigarette smoker (20-39 per day)   Hyperlipidemia LDL goal <70   PAD (peripheral artery disease) (HCC)   Hyponatremia   Unspecified atrial fibrillation (HCC)   Hypothyroidism   Coronary artery disease   Unsteady gait   Pressure injury of skin   History of esophageal cancer   Esophageal stenosis   . cholecalciferol  1,000 Units Oral Daily  . folic acid  1 mg Oral Daily  . levothyroxine  50 mcg Oral Q0600  . losartan  25 mg Oral Daily  . nicotine  21 mg Transdermal Daily  . oxyCODONE  20 mg Oral TID  . propranolol  20 mg Oral BID  . sodium chloride flush  10-40 mL Intracatheter Q12H    SUBJECTIVE:  Afebrile overnight with no acute events. Cultures now positive for Viridans streptococcus with sensitivities remaining pending.   Allergies  Allergen Reactions  . Glycopyrrolate Other (See Comments)    Intolerance  . Warfarin And Related Other (See Comments)    Excessive bleeding     Review of Systems: Review of Systems  Constitutional: Negative for chills, fever and weight loss.  Respiratory: Negative for cough, shortness of breath and wheezing.   Cardiovascular: Negative for chest pain and leg swelling.  Gastrointestinal: Negative for abdominal pain, constipation, diarrhea, nausea and vomiting.  Musculoskeletal: Positive for back pain.    Skin: Negative for rash.      OBJECTIVE: Vitals:   08/04/18 2225 08/05/18 0525 08/05/18 0945 08/05/18 1546  BP: 107/83 (!) 155/66 (!) 190/92 (!) 171/96  Pulse: 78 67 93 79  Resp:   16 16  Temp: (!) 97.2 F (36.2 C) (!) 97.5 F (36.4 C) 97.7 F (36.5 C)   TempSrc: Oral Oral Oral   SpO2: 100% 99% 100% 100%  Weight:      Height:       Body mass index is 24.69 kg/m.  Physical Exam Constitutional:      General: He is not in acute distress.    Appearance: He is well-developed.  Cardiovascular:     Rate and Rhythm: Normal rate and regular rhythm.     Heart sounds: Normal heart sounds.  Pulmonary:     Effort: Pulmonary effort is normal.     Breath sounds: Normal breath sounds.  Skin:    General: Skin is warm and dry.  Neurological:     Mental Status: He is alert.  Psychiatric:        Mood and Affect: Mood normal.     Lab Results Lab Results  Component Value Date   WBC 6.3 08/02/2018   HGB 11.7 (L) 08/02/2018   HCT 34.5 (L) 08/02/2018   MCV 99.4 08/02/2018   PLT 215 08/02/2018    Lab Results  Component Value Date   CREATININE 0.84 08/05/2018   BUN 5 (L) 08/05/2018   NA 135  08/05/2018   K 3.3 (L) 08/05/2018   CL 103 08/05/2018   CO2 24 08/05/2018    Lab Results  Component Value Date   ALT 17 08/02/2018   AST 31 08/02/2018   ALKPHOS 92 08/02/2018   BILITOT 0.8 08/02/2018     Microbiology: Recent Results (from the past 240 hour(s))  Aerobic/Anaerobic Culture (surgical/deep wound)     Status: None (Preliminary result)   Collection Time: 08/03/18  4:06 PM  Result Value Ref Range Status   Specimen Description WOUND VERTEBRA L1 L2  Final   Special Requests   Final    NONE Performed at [facility], [place] [address] [place], [place] [address]    Gram Stain NO WBC SEEN NO ORGANISMS SEEN   Final   Culture   Final    FEW VIRIDANS STREPTOCOCCUS NO ANAEROBES ISOLATED; CULTURE IN PROGRESS FOR 5 DAYS    Report Status PENDING  Incomplete     Terri Piedra, NP [facility] [facility] [phone] Pager  08/05/2018  5:24 PM

## 2018-08-05 NOTE — Progress Notes (Signed)
1455: [place], MD at bedside with this RN to speak to pt and wife.  1506: Per Donnie Mesa, PA from IR paged concerning pt's  Xarelto regimen post-IR. Awaiting response. Marthenia Rolling, MD aware. Will continue to monitor.

## 2018-08-05 NOTE — Progress Notes (Signed)
[facility] Infusion Coordinator will follow pt with ID team to support home infusion pharmacy services for home IV ABX at DC.  [facility] will partner with patients [place] agency of choice for home care services.  If patient discharges after hours, please call [phone].   Larry Sierras 08/05/2018, 8:14 AM

## 2018-08-05 NOTE — Care Management Note (Signed)
Case Management Note  Patient Details  Name: Jimmy Tucker MRN: [id number] Date of Birth: 1960/06/29  Subjective/Objective:   58 yr old male admitted with ow pack pain, MRI shows vertebral infection.                  Action/Plan: Patient will go home on IV antibiotics, PICC line has been inserted. [facility] will provide Home Health RN and IV antibiotics. Patient has family support at discharge,.  Expected Discharge Date:    08/05/18              Expected Discharge Plan:  [place]  In-House Referral:  NA  Discharge planning Services  CM Consult  Post Acute Care Choice:  Home Health Choice offered to:  Patient  DME Arranged:  N/A DME Agency:  NA  HH Arranged:  RN, IV Antibiotics HH Agency:  [facility]  Status of Service:  Completed, signed off  If discussed at [place] of Stay Meetings, dates discussed:    Additional Comments:  Ninfa Meeker, RN 08/05/2018, 10:54 AM

## 2018-08-06 MED ORDER — NICOTINE 21 MG/24HR TD PT24: 21.0000 mg | MEDICATED_PATCH | Freq: Every day | TRANSDERMAL | 0 refills | Status: DC

## 2018-08-06 MED ORDER — HEPARIN SOD (PORK) LOCK FLUSH 100 UNIT/ML IV SOLN: 250.0000 [IU] | INTRAVENOUS | Status: DC | PRN

## 2018-08-06 MED ORDER — CEFTRIAXONE IV (FOR PTA / DISCHARGE USE ONLY): 2.0000 g | INTRAVENOUS | 0 refills | Status: AC

## 2018-08-06 MED FILL — Fentanyl Citrate Preservative Free (PF) Inj 100 MCG/2ML: INTRAMUSCULAR | Qty: 2 | Status: AC

## 2018-08-06 NOTE — Progress Notes (Addendum)
Subjective: No new complaints   Antibiotics:  Anti-infectives (From admission, onward)   Start     Dose/Rate Route Frequency Ordered Stop   08/06/18 0526  cefTRIAXone (ROCEPHIN) 2 g in sodium chloride 0.9 % 100 mL IVPB     2 g 200 mL/hr over 30 Minutes Intravenous Every 24 hours 08/05/18 1636     08/04/18 0900  vancomycin (VANCOCIN) IVPB 1000 mg/200 mL premix  Status:  Discontinued     1,000 mg 200 mL/hr over 60 Minutes Intravenous Every 12 hours 08/03/18 2003 08/05/18 1636   08/03/18 1930  vancomycin (VANCOCIN) 1,250 mg in sodium chloride 0.9 % 250 mL IVPB     1,250 mg 166.7 mL/hr over 90 Minutes Intravenous  Once 08/03/18 1917 08/03/18 2130   08/03/18 1800  cefTRIAXone (ROCEPHIN) 2 g in sodium chloride 0.9 % 100 mL IVPB  Status:  Discontinued     2 g 200 mL/hr over 30 Minutes Intravenous Every 12 hours 08/03/18 1508 08/05/18 1636      Medications: Scheduled Meds: . cholecalciferol  1,000 Units Oral Daily  . folic acid  1 mg Oral Daily  . levothyroxine  50 mcg Oral Q0600  . losartan  25 mg Oral Daily  . nicotine  21 mg Transdermal Daily  . oxyCODONE  20 mg Oral TID  . propranolol  20 mg Oral BID  . sodium chloride flush  10-40 mL Intracatheter Q12H   Continuous Infusions: . sodium chloride 10 mL/hr at 08/04/18 1734  . cefTRIAXone (ROCEPHIN)  IV 2 g (08/06/18 0520)   PRN Meds:.acetaminophen **OR** acetaminophen, albuterol, ALPRAZolam, diphenoxylate-atropine, ketorolac, ondansetron **OR** ondansetron (ZOFRAN) IV, oxyCODONE, sodium chloride flush    Objective: Weight change:   Intake/Output Summary (Last 24 hours) at 08/06/2018 0942 Last data filed at 08/06/2018 0426 Gross per 24 hour  Intake 720 ml  Output 1550 ml  Net -830 ml   Blood pressure 134/70, pulse 63, temperature 97.6 F (36.4 C), temperature source Oral, resp. rate 16, height 5' 6"  (1.676 m), weight 69.4 kg, SpO2 99 %. Temp:  [97.6 F (36.4 C)-98 F (36.7 C)] 97.6 F (36.4 C) (02/11  0424) Pulse Rate:  [63-93] 63 (02/11 0424) Resp:  [14-16] 16 (02/11 0424) BP: (134-190)/(70-96) 134/70 (02/11 0424) SpO2:  [99 %-100 %] 99 % (02/11 0424)  Physical Exam: General: Alert and awake, oriented x3, not in any acute distress. HEENT: anicteric sclera, EOMI CVS regular rate, normal  Chest: , no wheezing, no respiratory distress Abdomen: soft non-distended,  Extremities: no edema or deformity noted bilaterally Skin: no rashes Neuro: nonfocal  CBC:    BMET Recent Labs    08/04/18 0417 08/05/18 0340  NA 134* 135  K 3.8 3.3*  CL 101 103  CO2 25 24  GLUCOSE 106* 104*  BUN 5* 5*  CREATININE 0.78 0.84  CALCIUM 8.7* 8.6*     Liver Panel  Recent Labs    08/04/18 0417 08/05/18 0340  ALBUMIN 2.6* 2.7*       Sedimentation Rate Recent Labs    08/04/18 0417  ESRSEDRATE 62*   C-Reactive Protein Recent Labs    08/04/18 0417  CRP 3.0*    Micro Results: Recent Results (from the past 720 hour(s))  Aerobic/Anaerobic Culture (surgical/deep wound)     Status: None (Preliminary result)   Collection Time: 08/03/18  4:06 PM  Result Value Ref Range Status   Specimen Description WOUND VERTEBRA L1 L2  Final   Special Requests  Final    NONE Performed at [facility], [place] [address] [place], [place] [address]    Gram Stain NO WBC SEEN NO ORGANISMS SEEN   Final   Culture   Final    FEW VIRIDANS STREPTOCOCCUS NO ANAEROBES ISOLATED; CULTURE IN PROGRESS FOR 5 DAYS    Report Status PENDING  Incomplete    Studies/Results: No results found.    Assessment/Plan:  INTERVAL HISTORY: Viridans Streptococcus growing sensitivities not back yet   Principal Problem:   Osteomyelitis of vertebra of lumbar region [facility]) Active Problems:   Hypertension   Heavy cigarette smoker (20-39 per day)   Hyperlipidemia LDL goal <70   PAD (peripheral artery disease) (HCC)   Hyponatremia   Unspecified atrial fibrillation (HCC)   Hypothyroidism   Coronary artery  disease   Unsteady gait   Osteomyelitis of lumbar spine (HCC)   Pressure injury of skin   History of esophageal cancer   Esophageal stenosis    Jimmy Tucker [place] is a 58 y.o. 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  I am comfortable discharging him from the hospital prior to getting sensitivity data.  If it ends up being a resistant organism we can always reconfigure his outpatient IV antibiotics and get him back on vancomycin.  Interim plan will be for 6 weeks of ceftriaxone as below.  Diagnosis:  viridans streptococcal discitis   Culture Result: viridans strep  Allergies  Allergen Reactions  . Glycopyrrolate Other (See Comments)    Intolerance  . Warfarin And Related Other (See Comments)    Excessive bleeding    OPAT Orders Discharge antibiotics: Ceftriaxone 2 g daily  duration: 6 weeks End Date:  March 20th, 2020  [facility] Care Per Protocol:  Labs weekly while on IV antibiotics: _x_ CBC with differential _x_ BMP  x__ CRP _x_ ESR   x__ Please pull PIC at completion of IV antibiotics __ Please leave PIC in place until doctor has seen patient or been notified  Fax weekly labs to [phone]  Clinic Follow Up Appt:  March 19th, 2020 at 9:30 AM with Terri Piedra, NP  At [facility] on [address]  LOS: 3 days   Alcide Evener 08/06/2018, 9:42 AM

## 2018-08-06 NOTE — Progress Notes (Signed)
PHARMACY CONSULT NOTE FOR:  OUTPATIENT  PARENTERAL ANTIBIOTIC THERAPY (OPAT)  Indication: Viridans streptococcus discitis Regimen: Ceftriaxone 2 gm every 24 hours End date: 09/13/2018  IV antibiotic discharge orders are pended. To discharging provider:  please sign these orders via discharge navigator,  Select New Orders & click on the button choice - Manage This Unsigned Work.     Thank you for allowing pharmacy to be a part of this patient's care.  Jimmy Footman, PharmD, BCPS, BCIDP Infectious Diseases Clinical Pharmacist Phone: [phone] 08/06/2018, 9:56 AM

## 2018-08-06 NOTE — Progress Notes (Signed)
RN gave pt and wife discharge instructions and they stated understanding. Wife packed up all belongings pt has 1 prescription that was escribed and a paper prescription for the home abx.

## 2018-08-06 NOTE — Discharge Summary (Addendum)
Physician Discharge Summary  Patient ID: Jimmy Tucker MRN: [id number] DOB/AGE: July 23, 1960 58 y.o.  Admit date: 08/02/2018 Discharge date: 08/06/2018  Admission Diagnoses:  Discharge Diagnoses:  Principal Problem:   Osteomyelitis of vertebra of lumbar region [facility]) Active Problems:   Hypertension   Heavy cigarette smoker (20-39 per day)   Hyperlipidemia LDL goal <70   PAD (peripheral artery disease) (HCC)   Hyponatremia   Unspecified atrial fibrillation (HCC)   Hypothyroidism   Coronary artery disease   Unsteady gait   Osteomyelitis of lumbar spine (HCC)   Pressure injury of skin   History of esophageal cancer   Esophageal stenosis   Discharged Condition: stable  Hospital Course: Patient is a 58 year old male, with past medical history significant for lumbar disc disease, peripheral artery disease, hypertension, hyperlipidemia, tobacco use, esophageal cancer, chronic low back pain, coronary artery disease and anxiety.  Patient has had chronic back pain for a while, but the back pain has been severe for over 7 months.  Patient was seen at [facility] and had MRI of the brain and lumbar spine.  MRI of the lumbar spine revealed findings compatible with L1-2 discitis osteomyelitis, paravertebral edema extending from T12-L3, possible small right paravertebral collection at L1-2, no epidural collection identified, mild lumbar spondylosis and no high-grade foraminal or spinal canal stenosis.  Patient was transferred to [facility] for further work-up and management, and for interventional radiology team to obtain a sample for culture and sensitivity and then start antibiotics afterwards.  Patient successfully underwent aspiration of the lumbar spine (L1-2), and a sample was sent for culture.  Few streptococcal viridans have been reported.  Patient was initially started on IV vancomycin and Rocephin.  Based on above culture result, patient will be discharged on IV Rocephin 2 g  daily for the next 6 weeks.  Final cultures are still pending.  Infectious disease team was consulted to assist with patient's care.  Chest disease team directed antibiotics management.  Patient will need BMP, CRP, CBC and ESR done weekly and faxed to the infectious disease office.  Patient will follow up with PCP and infectious disease team on discharge.  Patient's disease team has cleared patient for discharge.  Patient is eager to be discharged back home.  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 MRI of the lumbar spine revealed L1-2 discitis/osteomyelitis. Patient underwent aspiration of the L1-2 disc edema/collection Aspiration cultures grew few strep viridans Final cultures are still pending. -Patient was started on IV vancomycin and high-dose Rocephin after a aspiration was taken from the lumbar spine by the interventional radiology team. -Patient will be discharged on IV Rocephin 2 g daily. -CBC, BMP, CRP and ESR every week.  Fax results to the infectious disease team.  PVD (peripheral vascular disease): Continue aspirin.  Follows with Dr. 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   -Sodium level done on 08/05/2018 was 135.  Paroxysmalatrial fibrillation [facility]) Patient is on Xarelto.   Rate controlled.    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 Also gets evaluated by GI for EGD with dilatation.  Hypothyroidism Continue Synthroid  Essential tremors Continue propanolol  Essential hypertension Continue home meds  Dumping  syndrome Reports episodic diarrhea following  his gastric surgery. Continue Lomotil.  Tobacco use Counseled on cessation. Nicotine patch.  Consults: ID and Interventional radiology  Significant Diagnostic Studies:  MRI lumbar spine revealed the following: 1. Findings compatible with L1-2 discitis osteomyelitis. 2. Paravertebral edema extending from T12-L3. Possible small right paravertebral collection at L1-2. No epidural collection identified. 3. Mild lumbar spondylosis. No high-grade foraminal or spinal canal Stenosis.  MRI brain revealed: 1. No acute intracranial abnormality identified. 2. Mild chronic microvascular ischemic changes and volume loss of the brain. 3. 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 The mass is stable by report from 2017 and increased in size from 2008. 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 Differential of lymphoma or lymphadenopathy unlikely.  Lumbar aspirate culture grew few streptococcal viridans.  Final culture results are still pending.   Discharge Exam: Blood pressure 134/70, pulse 63, temperature 97.6 F (36.4 C), temperature source Oral, resp. rate 16, height 5' 6"  (1.676 m), weight 69.4 kg, SpO2 99 %.  Disposition: Discharge disposition: 01-Home or Self Care    Discharge Instructions    Diet - low sodium heart healthy   Complete by:  As directed    Home infusion instructions [facility] May follow [facility] Dosing Protocol; May administer Cathflo as needed to maintain patency of vascular access device.; Flushing of vascular access device: per [facility] Protocol: 0.9% NaCl pre/post medica...   Complete by:  As directed    Instructions:  May follow [place] Dosing Protocol   Instructions:  May administer Cathflo as needed to maintain patency of vascular access device.   Instructions:  Flushing of vascular access device: per [facility]  Protocol: 0.9% NaCl pre/post medication administration and prn patency; Heparin 100 u/ml, 69m for implanted ports and Heparin 10u/ml, 568mfor all other central venous catheters.   Instructions:  May follow [facility] Anaphylaxis Protocol for First Dose Administration in the home: 0.9% NaCl at 25-50 ml/hr to maintain IV access for protocol meds. Epinephrine 0.3 ml IV/IM PRN and Benadryl 25-50 IV/IM PRN s/s of anaphylaxis.   Instructions:  [facility] Coordinator (RN) to assist per patient IV care needs in the home PRN.   Increase activity slowly   Complete by:  As directed      Allergies as of 08/06/2018      Reactions   Glycopyrrolate Other (See Comments)   Intolerance   Warfarin And Related Other (See Comments)   Excessive bleeding      Medication List    TAKE these medications   ALPRAZolam 1 MG tablet Commonly known as:  XANAX Take 1 mg by mouth at bedtime as needed for anxiety.   aspirin EC 81 MG tablet Take 81 mg by mouth daily.   cefTRIAXone  IVPB Commonly known as:  ROCEPHIN Inject 2 g into the vein daily. Indication:  Viridans strep discitis Last Day of Therapy:  09/13/2018 Labs - Once weekly:  CBC/D and BMP, Labs - Every other week:  ESR and CRP   diphenoxylate-atropine 2.5-0.025 MG tablet Commonly known as:  LOMOTIL Take 1 tablet by mouth every 6 (six) hours as needed.   folic acid 1 MG tablet Commonly known as:  FOLVITE Take 1 tablet (1 mg total) by mouth daily.   levothyroxine 25 MCG tablet Commonly known as:  SYNTHROID, LEVOTHROID Take 2 tablets by mouth daily.   losartan  50 MG tablet Commonly known as:  COZAAR Take 0.5 tablets (25 mg total) by mouth daily.   nicotine 21 mg/24hr patch Commonly known as:  NICODERM CQ - dosed in mg/24 hours Place 1 patch (21 mg total) onto the skin daily. Start taking on:  August 07, 2018   Oxycodone HCl 20 MG Tabs Take 1 tablet (20 mg total) by mouth every 4 (four) hours as needed (pain).   PROAIR HFA 108  (90 Base) MCG/ACT inhaler Generic drug:  albuterol Inhale 2 puffs into the lungs every 4 (four) hours as needed for wheezing or shortness of breath.   propranolol 20 MG tablet Commonly known as:  INDERAL Take 20 mg by mouth 2 (two) times daily.   rivaroxaban 20 MG Tabs tablet Commonly known as:  XARELTO Take 1 tablet (20 mg total) by mouth daily with supper.   STIOLTO RESPIMAT 2.5-2.5 MCG/ACT Aers Generic drug:  Tiotropium Bromide-Olodaterol Inhale 2 puffs into the lungs daily.   Vitamin D3 50 MCG (2000 UT) capsule Take 1 capsule by mouth daily.            Home Infusion Instuctions  (From admission, onward)         Start     Ordered   08/06/18 0000  Home infusion instructions [facility] May follow [place] Dosing Protocol; May administer Cathflo as needed to maintain patency of vascular access device.; Flushing of vascular access device: per [facility] Protocol: 0.9% NaCl pre/post medica...    Question Answer Comment  Instructions May follow [place] Dosing Protocol   Instructions May administer Cathflo as needed to maintain patency of vascular access device.   Instructions Flushing of vascular access device: per [facility] Protocol: 0.9% NaCl pre/post medication administration and prn patency; Heparin 100 u/ml, 84m for implanted ports and Heparin 10u/ml, 562mfor all other central venous catheters.   Instructions May follow [facility] Anaphylaxis Protocol for First Dose Administration in the home: 0.9% NaCl at 25-50 ml/hr to maintain IV access for protocol meds. Epinephrine 0.3 ml IV/IM PRN and Benadryl 25-50 IV/IM PRN s/s of anaphylaxis.   Instructions [facility] Infusion Coordinator (RN) to assist per patient IV care needs in the home PRN.      08/06/18 1145         Follow-up Information    Health, [facility]-Home Follow up.   Specialty:  [place]:  A representative from [facility] contact you to arrange start date and tiem for  your Home Health Nurse, for IV antibiotics and PICC Line Care. Contact information: [phone]           Signed: SyBonnell Public/04/2019, 11:45 AM

## 2018-08-07 DIAGNOSIS — E785 Hyperlipidemia, unspecified: Secondary | ICD-10-CM | POA: Diagnosis not present

## 2018-08-07 DIAGNOSIS — I2510 Atherosclerotic heart disease of native coronary artery without angina pectoris: Secondary | ICD-10-CM | POA: Diagnosis not present

## 2018-08-07 DIAGNOSIS — C159 Malignant neoplasm of esophagus, unspecified: Secondary | ICD-10-CM | POA: Diagnosis not present

## 2018-08-07 DIAGNOSIS — B954 Other streptococcus as the cause of diseases classified elsewhere: Secondary | ICD-10-CM | POA: Diagnosis not present

## 2018-08-07 DIAGNOSIS — Z7901 Long term (current) use of anticoagulants: Secondary | ICD-10-CM | POA: Diagnosis not present

## 2018-08-07 DIAGNOSIS — Z452 Encounter for adjustment and management of vascular access device: Secondary | ICD-10-CM | POA: Diagnosis not present

## 2018-08-07 DIAGNOSIS — I739 Peripheral vascular disease, unspecified: Secondary | ICD-10-CM | POA: Diagnosis not present

## 2018-08-07 DIAGNOSIS — Z7982 Long term (current) use of aspirin: Secondary | ICD-10-CM | POA: Diagnosis not present

## 2018-08-07 DIAGNOSIS — I10 Essential (primary) hypertension: Secondary | ICD-10-CM | POA: Diagnosis not present

## 2018-08-07 DIAGNOSIS — Z792 Long term (current) use of antibiotics: Secondary | ICD-10-CM | POA: Diagnosis not present

## 2018-08-07 DIAGNOSIS — Z7951 Long term (current) use of inhaled steroids: Secondary | ICD-10-CM | POA: Diagnosis not present

## 2018-08-07 DIAGNOSIS — I4891 Unspecified atrial fibrillation: Secondary | ICD-10-CM | POA: Diagnosis not present

## 2018-08-07 DIAGNOSIS — E871 Hypo-osmolality and hyponatremia: Secondary | ICD-10-CM | POA: Diagnosis not present

## 2018-08-07 DIAGNOSIS — M869 Osteomyelitis, unspecified: Secondary | ICD-10-CM | POA: Diagnosis not present

## 2018-08-07 DIAGNOSIS — Z5181 Encounter for therapeutic drug level monitoring: Secondary | ICD-10-CM | POA: Diagnosis not present

## 2018-08-07 DIAGNOSIS — Z930 Tracheostomy status: Secondary | ICD-10-CM | POA: Diagnosis not present

## 2018-08-07 DIAGNOSIS — M4626 Osteomyelitis of vertebra, lumbar region: Secondary | ICD-10-CM | POA: Diagnosis not present

## 2018-08-08 LAB — AEROBIC/ANAEROBIC CULTURE W GRAM STAIN (SURGICAL/DEEP WOUND): Gram Stain: NONE SEEN

## 2018-08-08 LAB — AEROBIC/ANAEROBIC CULTURE (SURGICAL/DEEP WOUND)

## 2018-08-11 ENCOUNTER — Other Ambulatory Visit (HOSPITAL_COMMUNITY)
Admission: AD | Admit: 2018-08-11 | Discharge: 2018-08-11 | Disposition: A | Discharge status: Home / Self Care | Payer: Medicare Other | Source: Other Acute Inpatient Hospital | Attending: Infectious Disease | Admitting: Infectious Disease

## 2018-08-11 DIAGNOSIS — Z792 Long term (current) use of antibiotics: Secondary | ICD-10-CM | POA: Diagnosis not present

## 2018-08-11 DIAGNOSIS — E785 Hyperlipidemia, unspecified: Secondary | ICD-10-CM | POA: Diagnosis not present

## 2018-08-11 DIAGNOSIS — Z7982 Long term (current) use of aspirin: Secondary | ICD-10-CM | POA: Diagnosis not present

## 2018-08-11 DIAGNOSIS — Z5181 Encounter for therapeutic drug level monitoring: Secondary | ICD-10-CM | POA: Insufficient documentation

## 2018-08-11 DIAGNOSIS — I4891 Unspecified atrial fibrillation: Secondary | ICD-10-CM | POA: Diagnosis not present

## 2018-08-11 DIAGNOSIS — Z7901 Long term (current) use of anticoagulants: Secondary | ICD-10-CM | POA: Diagnosis not present

## 2018-08-11 DIAGNOSIS — I739 Peripheral vascular disease, unspecified: Secondary | ICD-10-CM | POA: Diagnosis not present

## 2018-08-11 DIAGNOSIS — Z7951 Long term (current) use of inhaled steroids: Secondary | ICD-10-CM | POA: Diagnosis not present

## 2018-08-11 DIAGNOSIS — B954 Other streptococcus as the cause of diseases classified elsewhere: Secondary | ICD-10-CM | POA: Diagnosis not present

## 2018-08-11 DIAGNOSIS — M4626 Osteomyelitis of vertebra, lumbar region: Secondary | ICD-10-CM | POA: Diagnosis not present

## 2018-08-11 DIAGNOSIS — I2510 Atherosclerotic heart disease of native coronary artery without angina pectoris: Secondary | ICD-10-CM | POA: Diagnosis not present

## 2018-08-11 DIAGNOSIS — E871 Hypo-osmolality and hyponatremia: Secondary | ICD-10-CM | POA: Diagnosis not present

## 2018-08-11 DIAGNOSIS — I10 Essential (primary) hypertension: Secondary | ICD-10-CM | POA: Diagnosis not present

## 2018-08-11 DIAGNOSIS — Z452 Encounter for adjustment and management of vascular access device: Secondary | ICD-10-CM | POA: Diagnosis not present

## 2018-08-11 LAB — CBC WITH DIFFERENTIAL/PLATELET
Abs Immature Granulocytes: 0.02 10*3/uL (ref 0.00–0.07)
BASOS PCT: 1 %
Basophils Absolute: 0 10*3/uL (ref 0.0–0.1)
Eosinophils Absolute: 0.1 10*3/uL (ref 0.0–0.5)
Eosinophils Relative: 1 %
HCT: 31.3 % — ABNORMAL LOW (ref 39.0–52.0)
Hemoglobin: 10.2 g/dL — ABNORMAL LOW (ref 13.0–17.0)
Immature Granulocytes: 0 %
Lymphocytes Relative: 26 %
Lymphs Abs: 1.6 10*3/uL (ref 0.7–4.0)
MCH: 32.7 pg (ref 26.0–34.0)
MCHC: 32.6 g/dL (ref 30.0–36.0)
MCV: 100.3 fL — AB (ref 80.0–100.0)
Monocytes Absolute: 0.5 10*3/uL (ref 0.1–1.0)
Monocytes Relative: 8 %
NEUTROS ABS: 3.9 10*3/uL (ref 1.7–7.7)
Neutrophils Relative %: 64 %
Platelets: 316 10*3/uL (ref 150–400)
RBC: 3.12 MIL/uL — ABNORMAL LOW (ref 4.22–5.81)
RDW: 12.4 % (ref 11.5–15.5)
WBC: 6.2 10*3/uL (ref 4.0–10.5)
nRBC: 0 % (ref 0.0–0.2)

## 2018-08-11 LAB — BASIC METABOLIC PANEL
Anion gap: 9 (ref 5–15)
BUN: 5 mg/dL — ABNORMAL LOW (ref 6–20)
CO2: 24 mmol/L (ref 22–32)
Calcium: 8.7 mg/dL — ABNORMAL LOW (ref 8.9–10.3)
Chloride: 100 mmol/L (ref 98–111)
Creatinine, Ser: 0.81 mg/dL (ref 0.61–1.24)
GFR calc Af Amer: >60 mL/min (ref >60)
GFR calc non Af Amer: >60 mL/min (ref >60)
Glucose, Bld: 98 mg/dL (ref 70–99)
POTASSIUM: 3 mmol/L — AB (ref 3.5–5.1)
Sodium: 133 mmol/L — ABNORMAL LOW (ref 135–145)

## 2018-08-11 LAB — CULTURE, BLOOD (ROUTINE X 2)
Culture: NO GROWTH
Culture: NO GROWTH
Special Requests: ADEQUATE
Special Requests: ADEQUATE

## 2018-08-11 LAB — SEDIMENTATION RATE: SED RATE: 69 mm/h — AB (ref 0–16)

## 2018-08-12 DIAGNOSIS — R197 Diarrhea, unspecified: Secondary | ICD-10-CM | POA: Diagnosis not present

## 2018-08-12 DIAGNOSIS — G894 Chronic pain syndrome: Secondary | ICD-10-CM | POA: Diagnosis not present

## 2018-08-12 DIAGNOSIS — M4630 Infection of intervertebral disc (pyogenic), site unspecified: Secondary | ICD-10-CM | POA: Diagnosis not present

## 2018-08-12 DIAGNOSIS — Z6823 Body mass index (BMI) 23.0-23.9, adult: Secondary | ICD-10-CM | POA: Diagnosis not present

## 2018-08-12 LAB — C-REACTIVE PROTEIN: CRP: 2.8 mg/dL — ABNORMAL HIGH (ref <1.0)

## 2018-08-15 DIAGNOSIS — C159 Malignant neoplasm of esophagus, unspecified: Secondary | ICD-10-CM | POA: Diagnosis not present

## 2018-08-15 DIAGNOSIS — K9189 Other postprocedural complications and disorders of digestive system: Secondary | ICD-10-CM | POA: Diagnosis not present

## 2018-08-15 DIAGNOSIS — E039 Hypothyroidism, unspecified: Secondary | ICD-10-CM | POA: Diagnosis not present

## 2018-08-15 DIAGNOSIS — T182XXA Foreign body in stomach, initial encounter: Secondary | ICD-10-CM | POA: Diagnosis not present

## 2018-08-15 DIAGNOSIS — C155 Malignant neoplasm of lower third of esophagus: Secondary | ICD-10-CM | POA: Diagnosis not present

## 2018-08-15 DIAGNOSIS — M869 Osteomyelitis, unspecified: Secondary | ICD-10-CM | POA: Diagnosis not present

## 2018-08-15 DIAGNOSIS — Z87891 Personal history of nicotine dependence: Secondary | ICD-10-CM | POA: Diagnosis not present

## 2018-08-15 DIAGNOSIS — Z8501 Personal history of malignant neoplasm of esophagus: Secondary | ICD-10-CM | POA: Diagnosis not present

## 2018-08-15 DIAGNOSIS — Z7901 Long term (current) use of anticoagulants: Secondary | ICD-10-CM | POA: Diagnosis not present

## 2018-08-15 DIAGNOSIS — J449 Chronic obstructive pulmonary disease, unspecified: Secondary | ICD-10-CM | POA: Diagnosis not present

## 2018-08-15 DIAGNOSIS — E785 Hyperlipidemia, unspecified: Secondary | ICD-10-CM | POA: Diagnosis not present

## 2018-08-15 DIAGNOSIS — I10 Essential (primary) hypertension: Secondary | ICD-10-CM | POA: Diagnosis not present

## 2018-08-15 DIAGNOSIS — K222 Esophageal obstruction: Secondary | ICD-10-CM | POA: Diagnosis not present

## 2018-08-15 DIAGNOSIS — I739 Peripheral vascular disease, unspecified: Secondary | ICD-10-CM | POA: Diagnosis not present

## 2018-08-15 DIAGNOSIS — K449 Diaphragmatic hernia without obstruction or gangrene: Secondary | ICD-10-CM | POA: Diagnosis not present

## 2018-08-15 DIAGNOSIS — Z930 Tracheostomy status: Secondary | ICD-10-CM | POA: Diagnosis not present

## 2018-08-16 DIAGNOSIS — I10 Essential (primary) hypertension: Secondary | ICD-10-CM | POA: Diagnosis not present

## 2018-08-16 DIAGNOSIS — K222 Esophageal obstruction: Secondary | ICD-10-CM | POA: Diagnosis not present

## 2018-08-16 DIAGNOSIS — M1991 Primary osteoarthritis, unspecified site: Secondary | ICD-10-CM | POA: Diagnosis not present

## 2018-08-16 DIAGNOSIS — G894 Chronic pain syndrome: Secondary | ICD-10-CM | POA: Diagnosis not present

## 2018-08-18 DIAGNOSIS — Z452 Encounter for adjustment and management of vascular access device: Secondary | ICD-10-CM | POA: Diagnosis not present

## 2018-08-18 DIAGNOSIS — Z7982 Long term (current) use of aspirin: Secondary | ICD-10-CM | POA: Diagnosis not present

## 2018-08-18 DIAGNOSIS — I739 Peripheral vascular disease, unspecified: Secondary | ICD-10-CM | POA: Diagnosis not present

## 2018-08-18 DIAGNOSIS — Z5181 Encounter for therapeutic drug level monitoring: Secondary | ICD-10-CM | POA: Diagnosis not present

## 2018-08-18 DIAGNOSIS — B954 Other streptococcus as the cause of diseases classified elsewhere: Secondary | ICD-10-CM | POA: Diagnosis not present

## 2018-08-18 DIAGNOSIS — Z7951 Long term (current) use of inhaled steroids: Secondary | ICD-10-CM | POA: Diagnosis not present

## 2018-08-18 DIAGNOSIS — M4626 Osteomyelitis of vertebra, lumbar region: Secondary | ICD-10-CM | POA: Diagnosis not present

## 2018-08-18 DIAGNOSIS — Z792 Long term (current) use of antibiotics: Secondary | ICD-10-CM | POA: Diagnosis not present

## 2018-08-18 DIAGNOSIS — E871 Hypo-osmolality and hyponatremia: Secondary | ICD-10-CM | POA: Diagnosis not present

## 2018-08-18 DIAGNOSIS — Z7901 Long term (current) use of anticoagulants: Secondary | ICD-10-CM | POA: Diagnosis not present

## 2018-08-18 DIAGNOSIS — I2510 Atherosclerotic heart disease of native coronary artery without angina pectoris: Secondary | ICD-10-CM | POA: Diagnosis not present

## 2018-08-18 DIAGNOSIS — E785 Hyperlipidemia, unspecified: Secondary | ICD-10-CM | POA: Diagnosis not present

## 2018-08-18 DIAGNOSIS — I4891 Unspecified atrial fibrillation: Secondary | ICD-10-CM | POA: Diagnosis not present

## 2018-08-18 DIAGNOSIS — I10 Essential (primary) hypertension: Secondary | ICD-10-CM | POA: Diagnosis not present

## 2018-08-19 DIAGNOSIS — I10 Essential (primary) hypertension: Secondary | ICD-10-CM | POA: Diagnosis not present

## 2018-08-19 DIAGNOSIS — B954 Other streptococcus as the cause of diseases classified elsewhere: Secondary | ICD-10-CM | POA: Diagnosis not present

## 2018-08-19 DIAGNOSIS — Z452 Encounter for adjustment and management of vascular access device: Secondary | ICD-10-CM | POA: Diagnosis not present

## 2018-08-19 DIAGNOSIS — I2510 Atherosclerotic heart disease of native coronary artery without angina pectoris: Secondary | ICD-10-CM | POA: Diagnosis not present

## 2018-08-26 ENCOUNTER — Encounter: Payer: Self-pay | Admitting: Family

## 2018-08-26 DIAGNOSIS — Z7951 Long term (current) use of inhaled steroids: Secondary | ICD-10-CM | POA: Diagnosis not present

## 2018-08-26 DIAGNOSIS — B954 Other streptococcus as the cause of diseases classified elsewhere: Secondary | ICD-10-CM | POA: Diagnosis not present

## 2018-08-26 DIAGNOSIS — I739 Peripheral vascular disease, unspecified: Secondary | ICD-10-CM | POA: Diagnosis not present

## 2018-08-26 DIAGNOSIS — Z792 Long term (current) use of antibiotics: Secondary | ICD-10-CM | POA: Diagnosis not present

## 2018-08-26 DIAGNOSIS — M4626 Osteomyelitis of vertebra, lumbar region: Secondary | ICD-10-CM | POA: Diagnosis not present

## 2018-08-26 DIAGNOSIS — I2510 Atherosclerotic heart disease of native coronary artery without angina pectoris: Secondary | ICD-10-CM | POA: Diagnosis not present

## 2018-08-26 DIAGNOSIS — Z452 Encounter for adjustment and management of vascular access device: Secondary | ICD-10-CM | POA: Diagnosis not present

## 2018-08-26 DIAGNOSIS — Z5181 Encounter for therapeutic drug level monitoring: Secondary | ICD-10-CM | POA: Diagnosis not present

## 2018-08-26 DIAGNOSIS — Z7982 Long term (current) use of aspirin: Secondary | ICD-10-CM | POA: Diagnosis not present

## 2018-08-26 DIAGNOSIS — E785 Hyperlipidemia, unspecified: Secondary | ICD-10-CM | POA: Diagnosis not present

## 2018-08-26 DIAGNOSIS — Z7901 Long term (current) use of anticoagulants: Secondary | ICD-10-CM | POA: Diagnosis not present

## 2018-08-26 DIAGNOSIS — E871 Hypo-osmolality and hyponatremia: Secondary | ICD-10-CM | POA: Diagnosis not present

## 2018-08-26 DIAGNOSIS — I10 Essential (primary) hypertension: Secondary | ICD-10-CM | POA: Diagnosis not present

## 2018-08-26 DIAGNOSIS — I4891 Unspecified atrial fibrillation: Secondary | ICD-10-CM | POA: Diagnosis not present

## 2018-08-29 DIAGNOSIS — Z930 Tracheostomy status: Secondary | ICD-10-CM | POA: Diagnosis not present

## 2018-08-29 DIAGNOSIS — C159 Malignant neoplasm of esophagus, unspecified: Secondary | ICD-10-CM | POA: Diagnosis not present

## 2018-08-29 DIAGNOSIS — M869 Osteomyelitis, unspecified: Secondary | ICD-10-CM | POA: Diagnosis not present

## 2018-09-02 DIAGNOSIS — I2510 Atherosclerotic heart disease of native coronary artery without angina pectoris: Secondary | ICD-10-CM | POA: Diagnosis not present

## 2018-09-02 DIAGNOSIS — E871 Hypo-osmolality and hyponatremia: Secondary | ICD-10-CM | POA: Diagnosis not present

## 2018-09-02 DIAGNOSIS — B954 Other streptococcus as the cause of diseases classified elsewhere: Secondary | ICD-10-CM | POA: Diagnosis not present

## 2018-09-02 DIAGNOSIS — Z452 Encounter for adjustment and management of vascular access device: Secondary | ICD-10-CM | POA: Diagnosis not present

## 2018-09-02 DIAGNOSIS — I4891 Unspecified atrial fibrillation: Secondary | ICD-10-CM | POA: Diagnosis not present

## 2018-09-02 DIAGNOSIS — M4626 Osteomyelitis of vertebra, lumbar region: Secondary | ICD-10-CM | POA: Diagnosis not present

## 2018-09-02 DIAGNOSIS — I739 Peripheral vascular disease, unspecified: Secondary | ICD-10-CM | POA: Diagnosis not present

## 2018-09-02 DIAGNOSIS — E785 Hyperlipidemia, unspecified: Secondary | ICD-10-CM | POA: Diagnosis not present

## 2018-09-02 DIAGNOSIS — Z5181 Encounter for therapeutic drug level monitoring: Secondary | ICD-10-CM | POA: Diagnosis not present

## 2018-09-02 DIAGNOSIS — Z7951 Long term (current) use of inhaled steroids: Secondary | ICD-10-CM | POA: Diagnosis not present

## 2018-09-02 DIAGNOSIS — I10 Essential (primary) hypertension: Secondary | ICD-10-CM | POA: Diagnosis not present

## 2018-09-02 DIAGNOSIS — Z792 Long term (current) use of antibiotics: Secondary | ICD-10-CM | POA: Diagnosis not present

## 2018-09-02 DIAGNOSIS — Z7982 Long term (current) use of aspirin: Secondary | ICD-10-CM | POA: Diagnosis not present

## 2018-09-02 DIAGNOSIS — Z7901 Long term (current) use of anticoagulants: Secondary | ICD-10-CM | POA: Diagnosis not present

## 2018-09-06 DIAGNOSIS — C159 Malignant neoplasm of esophagus, unspecified: Secondary | ICD-10-CM | POA: Diagnosis not present

## 2018-09-06 DIAGNOSIS — Z930 Tracheostomy status: Secondary | ICD-10-CM | POA: Diagnosis not present

## 2018-09-06 DIAGNOSIS — M869 Osteomyelitis, unspecified: Secondary | ICD-10-CM | POA: Diagnosis not present

## 2018-09-09 DIAGNOSIS — Z7982 Long term (current) use of aspirin: Secondary | ICD-10-CM | POA: Diagnosis not present

## 2018-09-09 DIAGNOSIS — E871 Hypo-osmolality and hyponatremia: Secondary | ICD-10-CM | POA: Diagnosis not present

## 2018-09-09 DIAGNOSIS — M4626 Osteomyelitis of vertebra, lumbar region: Secondary | ICD-10-CM | POA: Diagnosis not present

## 2018-09-09 DIAGNOSIS — I2510 Atherosclerotic heart disease of native coronary artery without angina pectoris: Secondary | ICD-10-CM | POA: Diagnosis not present

## 2018-09-09 DIAGNOSIS — Z7901 Long term (current) use of anticoagulants: Secondary | ICD-10-CM | POA: Diagnosis not present

## 2018-09-09 DIAGNOSIS — I4891 Unspecified atrial fibrillation: Secondary | ICD-10-CM | POA: Diagnosis not present

## 2018-09-09 DIAGNOSIS — Z5181 Encounter for therapeutic drug level monitoring: Secondary | ICD-10-CM | POA: Diagnosis not present

## 2018-09-09 DIAGNOSIS — Z792 Long term (current) use of antibiotics: Secondary | ICD-10-CM | POA: Diagnosis not present

## 2018-09-09 DIAGNOSIS — Z452 Encounter for adjustment and management of vascular access device: Secondary | ICD-10-CM | POA: Diagnosis not present

## 2018-09-09 DIAGNOSIS — E785 Hyperlipidemia, unspecified: Secondary | ICD-10-CM | POA: Diagnosis not present

## 2018-09-09 DIAGNOSIS — I10 Essential (primary) hypertension: Secondary | ICD-10-CM | POA: Diagnosis not present

## 2018-09-09 DIAGNOSIS — I739 Peripheral vascular disease, unspecified: Secondary | ICD-10-CM | POA: Diagnosis not present

## 2018-09-09 DIAGNOSIS — B954 Other streptococcus as the cause of diseases classified elsewhere: Secondary | ICD-10-CM | POA: Diagnosis not present

## 2018-09-09 DIAGNOSIS — Z7951 Long term (current) use of inhaled steroids: Secondary | ICD-10-CM | POA: Diagnosis not present

## 2018-09-11 DIAGNOSIS — I10 Essential (primary) hypertension: Secondary | ICD-10-CM | POA: Diagnosis not present

## 2018-09-11 DIAGNOSIS — G894 Chronic pain syndrome: Secondary | ICD-10-CM | POA: Diagnosis not present

## 2018-09-11 DIAGNOSIS — Z0001 Encounter for general adult medical examination with abnormal findings: Secondary | ICD-10-CM | POA: Diagnosis not present

## 2018-09-11 DIAGNOSIS — E039 Hypothyroidism, unspecified: Secondary | ICD-10-CM | POA: Diagnosis not present

## 2018-09-11 DIAGNOSIS — Z1389 Encounter for screening for other disorder: Secondary | ICD-10-CM | POA: Diagnosis not present

## 2018-09-11 DIAGNOSIS — Z6823 Body mass index (BMI) 23.0-23.9, adult: Secondary | ICD-10-CM | POA: Diagnosis not present

## 2018-09-11 DIAGNOSIS — E7849 Other hyperlipidemia: Secondary | ICD-10-CM | POA: Diagnosis not present

## 2018-09-12 ENCOUNTER — Ambulatory Visit (INDEPENDENT_AMBULATORY_CARE_PROVIDER_SITE_OTHER): Payer: Medicare Other | Admitting: Family

## 2018-09-12 ENCOUNTER — Other Ambulatory Visit: Payer: Self-pay

## 2018-09-12 ENCOUNTER — Telehealth: Payer: Self-pay

## 2018-09-12 ENCOUNTER — Encounter: Payer: Self-pay | Admitting: Family

## 2018-09-12 VITALS — BP 155/73 | HR 71 | Temp 97.8°F | Wt 150.0 lb

## 2018-09-12 DIAGNOSIS — M4626 Osteomyelitis of vertebra, lumbar region: Secondary | ICD-10-CM | POA: Diagnosis not present

## 2018-09-12 NOTE — Progress Notes (Signed)
Subjective:    Patient ID: Jimmy Tucker, male    DOB: 04-Apr-1961, 58 y.o.   MRN: [id number]  Chief Complaint  Patient presents with  . Osteomyelitis     HPI:  Jimmy Tucker is a 58 y.o. male with previous medical history of esophageal adenocarcinoma status post resection who presents today for hospitalization follow-up.  Jimmy Tucker presented to the hospital with left leg weakness and numbness in the setting of chronic worsening back pain over a timeframe of 6 to 7 months.  MRI with evidence of discitis at L1-L2 with edema extending from T12-L3.  A small right paravertebral fluid collection was noted with IR attempting aspiration and able to obtain 1 cc of fluid.  Gram stain's were without organisms seen with culture showing Streptococcus mitis/oralis. He was placed on 6 weeks of IV therapy with ceftriaxone with end date of 09/13/18. Inflammatory markers during treatment on 3/2 with ESR of 35 and CRP of 23 and on 3/17 with ESR of 47 and CRP of 3.   Jimmy Tucker has been receiving his ceftriaxone as prescribed with no adverse side effects with no missed doses. Back pain persists but is improved. Pain is 8/10 which he indicates is back to the baseline. Denies fevers, chills or sweats. PICC line remains intact with no signs of infection and functioning appropriately. He is eager to have the PICC line removed.    Allergies  Allergen Reactions  . Glycopyrrolate Other (See Comments)    Intolerance  . Warfarin And Related Other (See Comments)    Excessive bleeding      Outpatient Medications Prior to Visit  Medication Sig Dispense Refill  . albuterol (PROAIR HFA) 108 (90 BASE) MCG/ACT inhaler Inhale 2 puffs into the lungs every 4 (four) hours as needed for wheezing or shortness of breath.     . ALPRAZolam (XANAX) 1 MG tablet Take 1 mg by mouth at bedtime as needed for anxiety.     Marland Kitchen aspirin EC 81 MG tablet Take 81 mg by mouth daily.    . cefTRIAXone (ROCEPHIN) IVPB Inject 2 g into the vein  daily. Indication:  Viridans strep discitis Last Day of Therapy:  09/13/2018 Labs - Once weekly:  CBC/D and BMP, Labs - Every other week:  ESR and CRP 38 Units 0  . Cholecalciferol (VITAMIN D3) 2000 units capsule Take 1 capsule by mouth daily.    . diphenoxylate-atropine (LOMOTIL) 2.5-0.025 MG tablet Take 1 tablet by mouth every 6 (six) hours as needed.    . folic acid (FOLVITE) 1 MG tablet Take 1 tablet (1 mg total) by mouth daily. 30 tablet 5  . levothyroxine (SYNTHROID, LEVOTHROID) 25 MCG tablet Take 2 tablets by mouth daily.    Marland Kitchen losartan (COZAAR) 50 MG tablet Take 0.5 tablets (25 mg total) by mouth daily. 30 tablet 5  . nicotine (NICODERM CQ - DOSED IN MG/24 HOURS) 21 mg/24hr patch Place 1 patch (21 mg total) onto the skin daily. 28 patch 0  . Oxycodone HCl 20 MG TABS Take 1 tablet (20 mg total) by mouth every 4 (four) hours as needed (pain). 30 tablet 0  . propranolol (INDERAL) 20 MG tablet Take 20 mg by mouth 2 (two) times daily.     . rivaroxaban (XARELTO) 20 MG TABS tablet Take 1 tablet (20 mg total) by mouth daily with supper. 30 tablet 1  . Tiotropium Bromide-Olodaterol (STIOLTO RESPIMAT) 2.5-2.5 MCG/ACT AERS Inhale 2 puffs into the lungs daily.  No facility-administered medications prior to visit.      Past Medical History:  Diagnosis Date  . Abnormal nuclear stress test   . Anxiety   . Cancer ([place])   . Carotid artery disease ([place])   . Chronic back pain   . Chronic lower back pain    "L4-5"  . Esophageal cancer ([place])   . Heavy cigarette smoker (20-39 per day)    Unmotivated  . Hyperlipidemia LDL goal <70    Intolerant to statins  . Hypertension   . Lumbar disc disease   . PAD (peripheral artery disease) ([place]) 10/30/2012   a) 2011 LEA Dopplers: Left ATA occlusion; b) 10/2013: LEA Dopplers - L Common Iliac & CFA occlusion - w/ short reconstitution at the branch point of the external and internal iliac, the external iliac is occluded through the common femoral and proximal  SFA; reconstitutes in the proximal SFA. Two-vessel runoff beyond.; c) s/p LCIA Stent & CFA EA w/ patch angioplasty --> f/u doppler 12/01/2013 patent  . Pollen allergies   . Presence of stent in artery       Past Surgical History:  Procedure Laterality Date  . BACK SURGERY  02/2008  . CARDIAC CATHETERIZATION N/A 02/15/2015   Procedure: Left Heart Cath and Coronary Angiography;  Surgeon: Lorretta Harp, MD;  Location: [facility];  Service: Cardiovascular;  Laterality: N/A;  . COLONOSCOPY  07/24/2011   Procedure: COLONOSCOPY;  Surgeon: Daneil Dolin, MD;  [facility];  Service: Endoscopy;  Laterality: N/A;  8:15  . CORONARY STENT PLACEMENT    . ENDARTERECTOMY FEMORAL Left 11/11/2013   Procedure: Left Femoral Endarterectomy with Patch Angioplasty ;  Surgeon: Angelia Mould, MD;  Location: [facility];  Service: Vascular;  Laterality: Left;  . ESOPHAGOGASTRODUODENOSCOPY N/A 10/09/2014   Procedure: ESOPHAGOGASTRODUODENOSCOPY (EGD);  Surgeon: Rogene Houston, MD;  [facility];  Service: Endoscopy;  Laterality: N/A;  230 - moved to 9:55 - Ann notified pt to arrive at 8:55  . ILIAC ARTERY STENT Left 11/03/2013   Dr. Judithann Sauger  . IR FLUORO GUIDED NEEDLE PLC ASPIRATION/INJECTION LOC  08/03/2018  . LOWER EXTREMITY ANGIOGRAM N/A 11/03/2013   Procedure: LOWER EXTREMITY ANGIOGRAM;  Surgeon: Lorretta Harp, MD;  Location: [facility] CATH LAB;  Service: Cardiovascular;  Laterality: N/A;  . LUMBAR MICRODISCECTOMY Left 02/2008   L4-5  . NM MYOCAR PERF WALL MOTION  07/2009   persantine - normal perfusion  . SURGERY SCROTAL / TESTICULAR Left    cyst excision  . THORACIC ESOPHAGUS REPLACEMENT        Family History  Problem Relation Age of Onset  . Heart attack Father 74       Cardiac arrest  . Sudden death Father   . Stroke Brother 58  . Stroke Mother   . Colon cancer Neg Hx       Social History   Socioeconomic History  . Marital status: Married    Spouse name: Not on file   . Number of children: Not on file  . Years of education: Not on file  . Highest education level: Not on file  Occupational History  . Occupation: [employer]     Comment: in [place]  . Financial resource strain: Not on file  . Food insecurity:    Worry: Not on file    Inability: Not on file  . Transportation needs:    Medical: Not on file    Non-medical: Not on  file  Tobacco Use  . Smoking status: Former Smoker    Packs/day: 1.00    Years: 40.00    Pack years: 40.00    Types: Cigarettes    Last attempt to quit: 11/02/2013    Years since quitting: 4.8  . Smokeless tobacco: Never Used  Substance and Sexual Activity  . Alcohol use: Yes    Alcohol/week: 21.0 standard drinks    Types: 21 Cans of beer per week    Comment: every day   . Drug use: No  . Sexual activity: Not Currently  Lifestyle  . Physical activity:    Days per week: Not on file    Minutes per session: Not on file  . Stress: Not on file  Relationships  . Social connections:    Talks on phone: Not on file    Gets together: Not on file    Attends religious service: Not on file    Active member of club or organization: Not on file    Attends meetings of clubs or organizations: Not on file    Relationship status: Not on file  . Intimate partner violence:    Fear of current or ex partner: Not on file    Emotionally abused: Not on file    Physically abused: Not on file    Forced sexual activity: Not on file  Other Topics Concern  . Not on file  Social History Narrative   Married, still smokes about a pack a day. Unable to work currently because of his claudication.   He does work for [employer] in [place], [place].      Review of Systems  Constitutional: Negative for chills, fatigue and fever.  Respiratory: Negative for chest tightness, shortness of breath and wheezing.   Cardiovascular: Negative for chest pain.  Musculoskeletal: Positive for back pain.  Neurological:  Positive for numbness.       Objective:    BP (!) 155/73   Pulse 71   Temp 97.8 F (36.6 C)   Wt 150 lb (68 kg)   BMI 24.21 kg/m  Nursing note and vital signs reviewed.  Physical Exam Constitutional:      General: He is not in acute distress.    Appearance: He is well-developed.  Cardiovascular:     Rate and Rhythm: Normal rate and regular rhythm.     Heart sounds: Normal heart sounds.  Pulmonary:     Effort: Pulmonary effort is normal.     Breath sounds: Normal breath sounds. No wheezing or rales.  Musculoskeletal:     Comments: There is generalized tenderness of the lower back from T12-L3 with no mass, lesions, spasms or indurations present.  Skin:    General: Skin is warm and dry.  Neurological:     General: No focal deficit present.     Mental Status: He is alert.         Assessment & Plan:   Problem List Items Addressed This Visit      Musculoskeletal and Integument   Osteomyelitis of vertebra of lumbar region [facility]) - Primary    Jimmy Tucker is completing 6 weeks of IV therapy with ceftriaxone for Streptococcus mitis/oralis osteomyelitis. Back pain has improved and returned to baseline and inflammatory markers are improved. He is working on finding an Chief Financial Officer as this is most likely the source of his infection. No further antibiotics are needed at this time. Will ensure Home Health has orders to remove PICC at completion of therapy.  Advised to monitor for symptoms of increasing back pain, fevers, chills or sweats. Follow up with ID as needed.           I am having Jimmy Tucker maintain his ALPRAZolam, propranolol, albuterol, losartan, Oxycodone HCl, folic acid, aspirin EC, Vitamin D3, diphenoxylate-atropine, levothyroxine, rivaroxaban, Tiotropium Bromide-Olodaterol, cefTRIAXone, and nicotine.   Follow-up: Return if symptoms worsen or fail to improve.    Terri Piedra, MSN, FNP-C Nurse Practitioner [facility] for [facility]ious [facility] Office phone: [phone] Pager: [place] number: [phone]

## 2018-09-12 NOTE — Assessment & Plan Note (Addendum)
Jimmy Tucker is completing 6 weeks of IV therapy with ceftriaxone for Streptococcus mitis/oralis osteomyelitis. Back pain has improved and returned to baseline and inflammatory markers are improved. He is working on finding an Chief Financial Officer as this is most likely the source of his infection. No further antibiotics are needed at this time. Will ensure Home Health has orders to remove PICC at completion of therapy.  Advised to monitor for symptoms of increasing back pain, fevers, chills or sweats. Follow up with ID as needed.

## 2018-09-12 NOTE — Telephone Encounter (Signed)
Thank you :)

## 2018-09-12 NOTE — Telephone Encounter (Signed)
Called Jimmy Tucker, at [facility], Per Terri Piedra to pull PICC line following last dose tomorrow 09/13/2018.   Jimmy Tucker relayed understanding with feedback.  Patient is aware of new plans to have PICC line removed.   Lenore Cordia, [place]

## 2018-09-12 NOTE — Patient Instructions (Signed)
Nice to see you.  Please complete the last 2 doses of ceftriaxone.  We will make sure Jimmy Tucker has the orders to remove the PICC line.  It does not appear that you need any additional antibiotics at this time.  Continue to follow up with oral surgeon / dentist.  Follow up with ID as needed.

## 2018-09-13 DIAGNOSIS — Z452 Encounter for adjustment and management of vascular access device: Secondary | ICD-10-CM | POA: Diagnosis not present

## 2018-09-13 DIAGNOSIS — Z7901 Long term (current) use of anticoagulants: Secondary | ICD-10-CM | POA: Diagnosis not present

## 2018-09-13 DIAGNOSIS — I4891 Unspecified atrial fibrillation: Secondary | ICD-10-CM | POA: Diagnosis not present

## 2018-09-13 DIAGNOSIS — Z7982 Long term (current) use of aspirin: Secondary | ICD-10-CM | POA: Diagnosis not present

## 2018-09-13 DIAGNOSIS — M4626 Osteomyelitis of vertebra, lumbar region: Secondary | ICD-10-CM | POA: Diagnosis not present

## 2018-09-13 DIAGNOSIS — I2510 Atherosclerotic heart disease of native coronary artery without angina pectoris: Secondary | ICD-10-CM | POA: Diagnosis not present

## 2018-09-13 DIAGNOSIS — E785 Hyperlipidemia, unspecified: Secondary | ICD-10-CM | POA: Diagnosis not present

## 2018-09-13 DIAGNOSIS — B954 Other streptococcus as the cause of diseases classified elsewhere: Secondary | ICD-10-CM | POA: Diagnosis not present

## 2018-09-13 DIAGNOSIS — I739 Peripheral vascular disease, unspecified: Secondary | ICD-10-CM | POA: Diagnosis not present

## 2018-09-13 DIAGNOSIS — Z792 Long term (current) use of antibiotics: Secondary | ICD-10-CM | POA: Diagnosis not present

## 2018-09-13 DIAGNOSIS — I10 Essential (primary) hypertension: Secondary | ICD-10-CM | POA: Diagnosis not present

## 2018-09-13 DIAGNOSIS — Z5181 Encounter for therapeutic drug level monitoring: Secondary | ICD-10-CM | POA: Diagnosis not present

## 2018-09-13 DIAGNOSIS — E871 Hypo-osmolality and hyponatremia: Secondary | ICD-10-CM | POA: Diagnosis not present

## 2018-09-13 DIAGNOSIS — Z7951 Long term (current) use of inhaled steroids: Secondary | ICD-10-CM | POA: Diagnosis not present

## 2018-10-14 DIAGNOSIS — E871 Hypo-osmolality and hyponatremia: Secondary | ICD-10-CM | POA: Diagnosis not present

## 2018-10-15 DIAGNOSIS — M1991 Primary osteoarthritis, unspecified site: Secondary | ICD-10-CM | POA: Diagnosis not present

## 2018-10-15 DIAGNOSIS — E039 Hypothyroidism, unspecified: Secondary | ICD-10-CM | POA: Diagnosis not present

## 2018-10-15 DIAGNOSIS — I4891 Unspecified atrial fibrillation: Secondary | ICD-10-CM | POA: Diagnosis not present

## 2018-10-15 DIAGNOSIS — I10 Essential (primary) hypertension: Secondary | ICD-10-CM | POA: Diagnosis not present

## 2018-11-07 DIAGNOSIS — Z87891 Personal history of nicotine dependence: Secondary | ICD-10-CM | POA: Diagnosis not present

## 2018-11-07 DIAGNOSIS — R918 Other nonspecific abnormal finding of lung field: Secondary | ICD-10-CM | POA: Diagnosis not present

## 2018-11-07 DIAGNOSIS — M4626 Osteomyelitis of vertebra, lumbar region: Secondary | ICD-10-CM | POA: Diagnosis not present

## 2018-11-07 DIAGNOSIS — Z7289 Other problems related to lifestyle: Secondary | ICD-10-CM | POA: Diagnosis not present

## 2018-11-07 DIAGNOSIS — Z79899 Other long term (current) drug therapy: Secondary | ICD-10-CM | POA: Diagnosis not present

## 2018-11-07 DIAGNOSIS — U070 Vaping-related disorder: Secondary | ICD-10-CM | POA: Diagnosis not present

## 2018-11-07 DIAGNOSIS — C155 Malignant neoplasm of lower third of esophagus: Secondary | ICD-10-CM | POA: Diagnosis not present

## 2018-11-07 DIAGNOSIS — Z9049 Acquired absence of other specified parts of digestive tract: Secondary | ICD-10-CM | POA: Diagnosis not present

## 2018-11-07 DIAGNOSIS — E871 Hypo-osmolality and hyponatremia: Secondary | ICD-10-CM | POA: Diagnosis not present

## 2018-11-07 DIAGNOSIS — C159 Malignant neoplasm of esophagus, unspecified: Secondary | ICD-10-CM | POA: Diagnosis not present

## 2018-11-07 DIAGNOSIS — R109 Unspecified abdominal pain: Secondary | ICD-10-CM | POA: Diagnosis not present

## 2018-11-11 DIAGNOSIS — I10 Essential (primary) hypertension: Secondary | ICD-10-CM | POA: Diagnosis not present

## 2018-11-11 DIAGNOSIS — Z6823 Body mass index (BMI) 23.0-23.9, adult: Secondary | ICD-10-CM | POA: Diagnosis not present

## 2018-11-11 DIAGNOSIS — M1991 Primary osteoarthritis, unspecified site: Secondary | ICD-10-CM | POA: Diagnosis not present

## 2018-11-11 DIAGNOSIS — E871 Hypo-osmolality and hyponatremia: Secondary | ICD-10-CM | POA: Diagnosis not present

## 2018-11-11 DIAGNOSIS — G894 Chronic pain syndrome: Secondary | ICD-10-CM | POA: Diagnosis not present

## 2018-11-11 DIAGNOSIS — Z1389 Encounter for screening for other disorder: Secondary | ICD-10-CM | POA: Diagnosis not present

## 2018-11-21 DIAGNOSIS — E039 Hypothyroidism, unspecified: Secondary | ICD-10-CM | POA: Diagnosis not present

## 2018-11-21 DIAGNOSIS — I10 Essential (primary) hypertension: Secondary | ICD-10-CM | POA: Diagnosis not present

## 2018-11-21 DIAGNOSIS — E782 Mixed hyperlipidemia: Secondary | ICD-10-CM | POA: Diagnosis not present

## 2018-11-28 ENCOUNTER — Other Ambulatory Visit: Payer: Medicare Other

## 2018-11-28 ENCOUNTER — Other Ambulatory Visit: Payer: Self-pay

## 2018-11-28 DIAGNOSIS — Z20822 Contact with and (suspected) exposure to covid-19: Secondary | ICD-10-CM

## 2018-11-28 DIAGNOSIS — R6889 Other general symptoms and signs: Secondary | ICD-10-CM | POA: Diagnosis not present

## 2018-11-30 LAB — NOVEL CORONAVIRUS, NAA: SARS-CoV-2, NAA: NOT DETECTED

## 2018-12-05 DIAGNOSIS — K222 Esophageal obstruction: Secondary | ICD-10-CM | POA: Diagnosis not present

## 2018-12-05 DIAGNOSIS — Z7901 Long term (current) use of anticoagulants: Secondary | ICD-10-CM | POA: Diagnosis not present

## 2018-12-05 DIAGNOSIS — K9189 Other postprocedural complications and disorders of digestive system: Secondary | ICD-10-CM | POA: Diagnosis not present

## 2018-12-05 DIAGNOSIS — I739 Peripheral vascular disease, unspecified: Secondary | ICD-10-CM | POA: Diagnosis not present

## 2018-12-05 DIAGNOSIS — Z8501 Personal history of malignant neoplasm of esophagus: Secondary | ICD-10-CM | POA: Diagnosis not present

## 2018-12-05 DIAGNOSIS — Z87891 Personal history of nicotine dependence: Secondary | ICD-10-CM | POA: Diagnosis not present

## 2018-12-05 DIAGNOSIS — Z9049 Acquired absence of other specified parts of digestive tract: Secondary | ICD-10-CM | POA: Diagnosis not present

## 2018-12-05 DIAGNOSIS — C159 Malignant neoplasm of esophagus, unspecified: Secondary | ICD-10-CM | POA: Diagnosis not present

## 2018-12-09 ENCOUNTER — Telehealth: Payer: Self-pay | Admitting: *Deleted

## 2018-12-09 NOTE — Telephone Encounter (Signed)
Jimmy Tucker, will call back when ready to schedule his office visit.

## 2018-12-24 DIAGNOSIS — E782 Mixed hyperlipidemia: Secondary | ICD-10-CM | POA: Diagnosis not present

## 2018-12-24 DIAGNOSIS — I4891 Unspecified atrial fibrillation: Secondary | ICD-10-CM | POA: Diagnosis not present

## 2018-12-24 DIAGNOSIS — E7849 Other hyperlipidemia: Secondary | ICD-10-CM | POA: Diagnosis not present

## 2019-01-06 DIAGNOSIS — K222 Esophageal obstruction: Secondary | ICD-10-CM | POA: Diagnosis not present

## 2019-01-06 DIAGNOSIS — Z7901 Long term (current) use of anticoagulants: Secondary | ICD-10-CM | POA: Diagnosis not present

## 2019-01-06 DIAGNOSIS — C155 Malignant neoplasm of lower third of esophagus: Secondary | ICD-10-CM | POA: Diagnosis not present

## 2019-01-06 DIAGNOSIS — I739 Peripheral vascular disease, unspecified: Secondary | ICD-10-CM | POA: Diagnosis not present

## 2019-01-06 DIAGNOSIS — Z87891 Personal history of nicotine dependence: Secondary | ICD-10-CM | POA: Diagnosis not present

## 2019-01-06 DIAGNOSIS — K9189 Other postprocedural complications and disorders of digestive system: Secondary | ICD-10-CM | POA: Diagnosis not present

## 2019-01-06 DIAGNOSIS — K2210 Ulcer of esophagus without bleeding: Secondary | ICD-10-CM | POA: Diagnosis not present

## 2019-01-06 DIAGNOSIS — Z9049 Acquired absence of other specified parts of digestive tract: Secondary | ICD-10-CM | POA: Diagnosis not present

## 2019-01-06 DIAGNOSIS — Z8501 Personal history of malignant neoplasm of esophagus: Secondary | ICD-10-CM | POA: Diagnosis not present

## 2019-01-10 DIAGNOSIS — M1991 Primary osteoarthritis, unspecified site: Secondary | ICD-10-CM | POA: Diagnosis not present

## 2019-01-10 DIAGNOSIS — I4891 Unspecified atrial fibrillation: Secondary | ICD-10-CM | POA: Diagnosis not present

## 2019-01-10 DIAGNOSIS — G894 Chronic pain syndrome: Secondary | ICD-10-CM | POA: Diagnosis not present

## 2019-01-10 DIAGNOSIS — K222 Esophageal obstruction: Secondary | ICD-10-CM | POA: Diagnosis not present

## 2019-01-10 DIAGNOSIS — Z79899 Other long term (current) drug therapy: Secondary | ICD-10-CM | POA: Diagnosis not present

## 2019-01-10 DIAGNOSIS — Z6822 Body mass index (BMI) 22.0-22.9, adult: Secondary | ICD-10-CM | POA: Diagnosis not present

## 2019-01-23 DIAGNOSIS — E039 Hypothyroidism, unspecified: Secondary | ICD-10-CM | POA: Diagnosis not present

## 2019-01-23 DIAGNOSIS — I10 Essential (primary) hypertension: Secondary | ICD-10-CM | POA: Diagnosis not present

## 2019-01-23 DIAGNOSIS — E782 Mixed hyperlipidemia: Secondary | ICD-10-CM | POA: Diagnosis not present

## 2019-02-19 DIAGNOSIS — Z6822 Body mass index (BMI) 22.0-22.9, adult: Secondary | ICD-10-CM | POA: Diagnosis not present

## 2019-02-19 DIAGNOSIS — G894 Chronic pain syndrome: Secondary | ICD-10-CM | POA: Diagnosis not present

## 2019-02-24 DIAGNOSIS — E782 Mixed hyperlipidemia: Secondary | ICD-10-CM | POA: Diagnosis not present

## 2019-02-24 DIAGNOSIS — E039 Hypothyroidism, unspecified: Secondary | ICD-10-CM | POA: Diagnosis not present

## 2019-02-24 DIAGNOSIS — I10 Essential (primary) hypertension: Secondary | ICD-10-CM | POA: Diagnosis not present

## 2019-03-26 DIAGNOSIS — J449 Chronic obstructive pulmonary disease, unspecified: Secondary | ICD-10-CM | POA: Diagnosis not present

## 2019-03-26 DIAGNOSIS — I4891 Unspecified atrial fibrillation: Secondary | ICD-10-CM | POA: Diagnosis not present

## 2019-03-26 DIAGNOSIS — E782 Mixed hyperlipidemia: Secondary | ICD-10-CM | POA: Diagnosis not present

## 2019-04-11 ENCOUNTER — Other Ambulatory Visit: Payer: Self-pay

## 2019-04-11 NOTE — Patient Outreach (Signed)
[facility]) Care Management  04/11/2019  Jimmy Tucker 1961-03-19 [id number]   Medication Adherence call to Jimmy Tucker Hippa Identifiers Verify spoke with patient he is past due on Losartan 50 mg patient explain he takes 1 tablet daily patient has medication at this time and will order when due.Jimmy Tucker is showing past due under [facility].   [facility] Management Direct Dial [phone]  Fax [phone] [email or web address]

## 2019-04-14 DIAGNOSIS — E871 Hypo-osmolality and hyponatremia: Secondary | ICD-10-CM | POA: Diagnosis not present

## 2019-04-25 DIAGNOSIS — I4891 Unspecified atrial fibrillation: Secondary | ICD-10-CM | POA: Diagnosis not present

## 2019-04-25 DIAGNOSIS — I10 Essential (primary) hypertension: Secondary | ICD-10-CM | POA: Diagnosis not present

## 2019-04-29 ENCOUNTER — Other Ambulatory Visit: Payer: Self-pay

## 2019-04-29 ENCOUNTER — Ambulatory Visit (INDEPENDENT_AMBULATORY_CARE_PROVIDER_SITE_OTHER): Payer: Medicare Other | Admitting: Cardiovascular Disease

## 2019-04-29 ENCOUNTER — Encounter: Payer: Self-pay | Admitting: Cardiovascular Disease

## 2019-04-29 VITALS — BP 99/63 | HR 78 | Temp 97.2°F | Ht 66.0 in | Wt 150.2 lb

## 2019-04-29 DIAGNOSIS — I480 Paroxysmal atrial fibrillation: Secondary | ICD-10-CM | POA: Diagnosis not present

## 2019-04-29 DIAGNOSIS — I2510 Atherosclerotic heart disease of native coronary artery without angina pectoris: Secondary | ICD-10-CM | POA: Diagnosis not present

## 2019-04-29 DIAGNOSIS — E785 Hyperlipidemia, unspecified: Secondary | ICD-10-CM

## 2019-04-29 DIAGNOSIS — I739 Peripheral vascular disease, unspecified: Secondary | ICD-10-CM | POA: Diagnosis not present

## 2019-04-29 NOTE — Progress Notes (Signed)
04/29/2019 Jimmy Tucker   26-Nov-1960  [id number]  Primary Physician [name], MD Primary Cardiologist: Lorretta Harp MD Garret Reddish, [place], [place]  HPI:  Jimmy Tucker is a 58 y.o.   Married Caucasian male who I saw in the office 01/25/2018.  He currently is a patient of Dr. Gerarda Fraction in [place]. He has a history of tobacco abuse (recently discontinued), hypertension and hyperlipidemia. Because of critical limb ischemia he had arterial Dopplers performed in our office on 5 10/30/2013 revealing an occluded common iliac, occluded SFA and proximal SFA.I performed angiography on him 11/03/13 and stented a subtotally occluded left common iliac artery. He did have an occluded left common femoral and diffuse SFA disease. Dr. Scot Dock performed endarterectomy with bovine patch angioplasty of his occluded left common femoral artery on 11/11/13. Doppler's office today revealed a normal left ABI. His ulcers subsequently healed. He has since stopped smoking. He denies chest pain or shortness of breath.   Because of a abnormal Myoview stress test that showed mild ischemic abnormalities in the RCA and LAD territories I performed cardiac catheterization on him 02/15/2015 revealing moderate disease in the LAD and RCA which I did not think was high high-grade.  He subsequently underwent total esophagectomy without cardiovascular sequela.  He denies chest pain or shortness of breath.  He was found to have PAF and was placed on Xarelto.  Since I saw him a year ago he is remained stable.  His major complaint is of back pain.  He denies chest pain or shortness of breath.  Unfortunately, he started back smoking 7 months ago at the at the onset of COVID-19 but it is committed to stopping.   Current Meds  Medication Sig  . albuterol (PROAIR HFA) 108 (90 BASE) MCG/ACT inhaler Inhale 2 puffs into the lungs every 4 (four) hours as needed for wheezing or shortness of breath.   . ALPRAZolam (XANAX) 1 MG tablet Take 1  mg by mouth at bedtime as needed for anxiety.   Marland Kitchen aspirin EC 81 MG tablet Take 81 mg by mouth daily.  . Cholecalciferol (VITAMIN D3) 2000 units capsule Take 1 capsule by mouth daily.  . diphenoxylate-atropine (LOMOTIL) 2.5-0.025 MG tablet Take 1 tablet by mouth every 6 (six) hours as needed.  . folic acid (FOLVITE) 1 MG tablet Take 1 tablet (1 mg total) by mouth daily.  Marland Kitchen levothyroxine (SYNTHROID, LEVOTHROID) 25 MCG tablet Take 2 tablets by mouth daily.  Marland Kitchen losartan (COZAAR) 50 MG tablet Take 0.5 tablets (25 mg total) by mouth daily.  . nicotine (NICODERM CQ - DOSED IN MG/24 HOURS) 21 mg/24hr patch Place 1 patch (21 mg total) onto the skin daily.  . Oxycodone HCl 20 MG TABS Take 1 tablet (20 mg total) by mouth every 4 (four) hours as needed (pain).  . propranolol (INDERAL) 20 MG tablet Take 20 mg by mouth 2 (two) times daily.   . rivaroxaban (XARELTO) 20 MG TABS tablet Take 1 tablet (20 mg total) by mouth daily with supper.  . Tiotropium Bromide-Olodaterol (STIOLTO RESPIMAT) 2.5-2.5 MCG/ACT AERS Inhale 2 puffs into the lungs daily.     Allergies  Allergen Reactions  . Glycopyrrolate Other (See Comments)    Intolerance  . Warfarin And Related Other (See Comments)    Excessive bleeding    Social History   Socioeconomic History  . Marital status: Married    Spouse name: Not on file  . Number of children: Not on file  . Years  of education: Not on file  . Highest education level: Not on file  Occupational History  . Occupation: [employer]     Comment: in [place]  . Financial resource strain: Not on file  . Food insecurity    Worry: Not on file    Inability: Not on file  . Transportation needs    Medical: Not on file    Non-medical: Not on file  Tobacco Use  . Smoking status: Former Smoker    Packs/day: 1.00    Years: 40.00    Pack years: 40.00    Types: Cigarettes    Quit date: 11/02/2013    Years since quitting: 5.4  . Smokeless tobacco: Never Used  Substance  and Sexual Activity  . Alcohol use: Yes    Alcohol/week: 21.0 standard drinks    Types: 21 Cans of beer per week    Comment: every day   . Drug use: No  . Sexual activity: Not Currently  Lifestyle  . Physical activity    Days per week: Not on file    Minutes per session: Not on file  . Stress: Not on file  Relationships  . Social Herbalist on phone: Not on file    Gets together: Not on file    Attends religious service: Not on file    Active member of club or organization: Not on file    Attends meetings of clubs or organizations: Not on file    Relationship status: Not on file  . Intimate partner violence    Fear of current or ex partner: Not on file    Emotionally abused: Not on file    Physically abused: Not on file    Forced sexual activity: Not on file  Other Topics Concern  . Not on file  Social History Narrative   Married, still smokes about a pack a day. Unable to work currently because of his claudication.   He does work for [employer] in [place], [place].     Review of Systems: General: negative for chills, fever, night sweats or weight changes.  Cardiovascular: negative for chest pain, dyspnea on exertion, edema, orthopnea, palpitations, paroxysmal nocturnal dyspnea or shortness of breath Dermatological: negative for rash Respiratory: negative for cough or wheezing Urologic: negative for hematuria Abdominal: negative for nausea, vomiting, diarrhea, bright red blood per rectum, melena, or hematemesis Neurologic: negative for visual changes, syncope, or dizziness All other systems reviewed and are otherwise negative except as noted above.    Blood pressure 99/63, pulse 78, temperature (!) 97.2 F (36.2 C), height 5\' 6"  (1.676 m), weight 150 lb 3.2 oz (68.1 kg), SpO2 (!) 89 %.  General appearance: alert and no distress Neck: no adenopathy, no carotid bruit, no JVD, supple, symmetrical, trachea midline and thyroid not enlarged,  symmetric, no tenderness/mass/nodules Lungs: clear to auscultation bilaterally Heart: regular rate and rhythm, S1, S2 normal, no murmur, click, rub or gallop Extremities: extremities normal, atraumatic, no cyanosis or edema Pulses: 2+ and symmetric Skin: Skin color, texture, turgor normal. No rashes or lesions Neurologic: Alert and oriented X 3, normal strength and tone. Normal symmetric reflexes. Normal coordination and gait  EKG normal sinus rhythm at 81 without ST or T wave changes.  I personally reviewed this EKG.  ASSESSMENT AND PLAN:   Hypertension History of essential hypertension with blood pressure measured today at 99/63.  He is on losartan and propranolol.  Heavy cigarette smoker (20-39 per day) History  of ongoing tobacco abuse started back again 7 months ago with the intent to stop  PAD (peripheral artery disease) ([place]) History of peripheral arterial disease with critical limb ischemia status post intervention on an occluded left common iliac artery by myself 10/30/2013.  He ultimately underwent left common femoral endarterectomy with patch angioplasty by Dr. Scot Dock for an occluded left common femoral artery.  His wounds ultimately healed.  He currently denies claudication.  Hyperlipidemia LDL goal <70 History of hyperlipidemia not on statin therapy with lipid profile performed 09/11/2018 revealing total cholesterol 190, LDL of 91-HDL of 87  Coronary artery disease History of mild CAD by cardiac catheter performed 02/15/2015 after a Myoview stress that showed mild ischemia in the RCA and LAD territories.  Unspecified atrial fibrillation (HCC) History of PAF maintaining sinus rhythm on Xarelto oral anticoagulation.      Lorretta Harp MD FACP,FACC,FAHA, [facility] 04/29/2019 3:55 PM

## 2019-04-29 NOTE — Assessment & Plan Note (Signed)
History of essential hypertension with blood pressure measured today at 99/63.  He is on losartan and propranolol.

## 2019-04-29 NOTE — Assessment & Plan Note (Signed)
History of peripheral arterial disease with critical limb ischemia status post intervention on an occluded left common iliac artery by myself 10/30/2013.  He ultimately underwent left common femoral endarterectomy with patch angioplasty by Dr. Scot Dock for an occluded left common femoral artery.  His wounds ultimately healed.  He currently denies claudication.

## 2019-04-29 NOTE — Assessment & Plan Note (Signed)
History of hyperlipidemia not on statin therapy with lipid profile performed 09/11/2018 revealing total cholesterol 190, LDL of 91-HDL of 87

## 2019-04-29 NOTE — Assessment & Plan Note (Signed)
History of PAF maintaining sinus rhythm on Xarelto oral anticoagulation. °

## 2019-04-29 NOTE — Patient Instructions (Signed)
Medication Instructions:  Your physician recommends that you continue on your current medications as directed. Please refer to the Current Medication list given to you today.  If you need a refill on your cardiac medications before your next appointment, please call your pharmacy.   Lab work: NONE  Testing/Procedures: Your physician has requested that you have a lower or upper extremity arterial duplex. This test is an ultrasound of the arteries in the legs or arms. It looks at arterial blood flow in the legs and arms. Allow one hour for Lower and Upper Arterial scans. There are no restrictions or special instructions  Follow-Up: At [facility], you and your health needs are our priority.  As part of our continuing mission to provide you with exceptional heart care, we have created designated Provider Care Teams.  These Care Teams include your primary Cardiologist (physician) and Advanced Practice Providers (APPs -  Physician Assistants and Nurse Practitioners) who all work together to provide you with the care you need, when you need it. You may see Dr [name] or one of the following Advanced Practice Providers on your designated Care Team:    Kerin Ransom, PA-C  [place], [place]  Coletta Memos, [place] Your physician wants you to follow-up in: 1 year. You will receive a reminder letter in the mail two months in advance. If you don't receive a letter, please call our office to schedule the follow-up appointment.

## 2019-04-29 NOTE — Assessment & Plan Note (Signed)
History of ongoing tobacco abuse started back again 7 months ago with the intent to stop

## 2019-04-29 NOTE — Assessment & Plan Note (Signed)
History of mild CAD by cardiac catheter performed 02/15/2015 after a Myoview stress that showed mild ischemia in the RCA and LAD territories.

## 2019-05-08 DIAGNOSIS — C158 Malignant neoplasm of overlapping sites of esophagus: Secondary | ICD-10-CM | POA: Diagnosis not present

## 2019-05-08 DIAGNOSIS — C159 Malignant neoplasm of esophagus, unspecified: Secondary | ICD-10-CM | POA: Diagnosis not present

## 2019-05-08 DIAGNOSIS — E871 Hypo-osmolality and hyponatremia: Secondary | ICD-10-CM | POA: Diagnosis not present

## 2019-05-08 DIAGNOSIS — M7989 Other specified soft tissue disorders: Secondary | ICD-10-CM | POA: Diagnosis not present

## 2019-05-08 DIAGNOSIS — Z9889 Other specified postprocedural states: Secondary | ICD-10-CM | POA: Diagnosis not present

## 2019-05-08 DIAGNOSIS — C160 Malignant neoplasm of cardia: Secondary | ICD-10-CM | POA: Diagnosis not present

## 2019-05-08 DIAGNOSIS — R918 Other nonspecific abnormal finding of lung field: Secondary | ICD-10-CM | POA: Diagnosis not present

## 2019-05-08 DIAGNOSIS — Z87891 Personal history of nicotine dependence: Secondary | ICD-10-CM | POA: Diagnosis not present

## 2019-05-08 DIAGNOSIS — G9589 Other specified diseases of spinal cord: Secondary | ICD-10-CM | POA: Diagnosis not present

## 2019-05-08 DIAGNOSIS — J984 Other disorders of lung: Secondary | ICD-10-CM | POA: Diagnosis not present

## 2019-05-12 DIAGNOSIS — G894 Chronic pain syndrome: Secondary | ICD-10-CM | POA: Diagnosis not present

## 2019-05-12 DIAGNOSIS — E063 Autoimmune thyroiditis: Secondary | ICD-10-CM | POA: Diagnosis not present

## 2019-05-12 DIAGNOSIS — M2550 Pain in unspecified joint: Secondary | ICD-10-CM | POA: Diagnosis not present

## 2019-05-12 DIAGNOSIS — I10 Essential (primary) hypertension: Secondary | ICD-10-CM | POA: Diagnosis not present

## 2019-05-12 DIAGNOSIS — Z6823 Body mass index (BMI) 23.0-23.9, adult: Secondary | ICD-10-CM | POA: Diagnosis not present

## 2019-05-13 ENCOUNTER — Encounter (HOSPITAL_COMMUNITY): Payer: Medicare Other

## 2019-05-20 ENCOUNTER — Encounter (INDEPENDENT_AMBULATORY_CARE_PROVIDER_SITE_OTHER): Payer: Self-pay | Admitting: Gastroenterology

## 2019-06-12 ENCOUNTER — Inpatient Hospital Stay (HOSPITAL_COMMUNITY): Admission: RE | Admit: 2019-06-12 | Payer: Medicare Other | Source: Ambulatory Visit

## 2019-06-12 ENCOUNTER — Encounter (HOSPITAL_COMMUNITY): Payer: Medicare Other

## 2019-07-03 ENCOUNTER — Encounter (INDEPENDENT_AMBULATORY_CARE_PROVIDER_SITE_OTHER): Payer: Self-pay | Admitting: Gastroenterology

## 2019-07-03 ENCOUNTER — Other Ambulatory Visit: Payer: Self-pay

## 2019-07-03 ENCOUNTER — Ambulatory Visit (INDEPENDENT_AMBULATORY_CARE_PROVIDER_SITE_OTHER): Payer: Medicare Other | Admitting: Gastroenterology

## 2019-07-03 VITALS — Ht 67.0 in | Wt 143.5 lb

## 2019-07-03 DIAGNOSIS — Z8501 Personal history of malignant neoplasm of esophagus: Secondary | ICD-10-CM

## 2019-07-03 DIAGNOSIS — R1310 Dysphagia, unspecified: Secondary | ICD-10-CM | POA: Diagnosis not present

## 2019-07-03 NOTE — Progress Notes (Signed)
Patient requested virtual phone only visit. 15 minutes on phone w/ patient.   Patient profile: Jimmy Tucker is a 59 y.o. male seen for evaluation of dysphagia .  History of Present Illness: Jimmy Tucker is seen today for dysphagia. He has a complex history of esophageal strictures recurrently, PMHX of esophageal adenocarcinoma s/p esophagectomy and gastric pull through. He has suffered w/ dysphagia and has had multiple EGDs since surgery most recently in 12/2018 and 11/2018 at [facility]. Reports last dilated to 90mm but unable to review this report in care everywhere.   He reports currently his swallowing is doing okay but made appointment today to discuss if any medication is available to decrease need for esophageal strictures in future. He is tolerating foods such as hamburger okay as long as he eats slowly.  Feels he does great w/ softer foods. When has occasional dysphagia is in upper esophageal area. He denies any GERD symptoms. Does have some early satiety. Thinks wt is okay but doesn't have scale. No nausea/vomiting. Does not feel he needs an additional esophageal dilation at this time.   Reports bowel habits improved w/ stopping dairy. If does have dairy needs Lomotil for symptom control. He denies any rectal bleeding or melena. No lower abd pain.   He does not take nsaids. He does drink alcohol "few beers" 3-4x/week.   Wt Readings from Last 3 Encounters:  07/03/19 143 lb 8 oz (65.1 kg)  04/29/19 150 lb 3.2 oz (68.1 kg)  09/12/18 150 lb (68 kg)   Wt per patient - virtual visit - feels his weight is stable  Last Colonoscopy: 2013-diverticulosis and internal hemorrhoids.  Last Endoscopy: [facility] 2016    Past Medical History:  Past Medical History:  Diagnosis Date  . Abnormal nuclear stress test   . Anxiety   . Cancer ([place])   . Carotid artery disease ([place])   . Chronic back pain   . Chronic lower back pain    "L4-5"  . Esophageal cancer ([place])   . Heavy cigarette  smoker (20-39 per day)    Unmotivated  . Hyperlipidemia LDL goal <70    Intolerant to statins  . Hypertension   . Lumbar disc disease   . PAD (peripheral artery disease) ([place]) 10/30/2012   a) 2011 LEA Dopplers: Left ATA occlusion; b) 10/2013: LEA Dopplers - L Common Iliac & CFA occlusion - w/ short reconstitution at the branch point of the external and internal iliac, the external iliac is occluded through the common femoral and proximal SFA; reconstitutes in the proximal SFA. Two-vessel runoff beyond.; c) s/p LCIA Stent & CFA EA w/ patch angioplasty --> f/u doppler 12/01/2013 patent  . Pollen allergies   . Presence of stent in artery     Problem List: Patient Active Problem List   Diagnosis Date Noted  . Pressure injury of skin 08/03/2018  . History of esophageal cancer 08/03/2018  . Esophageal stenosis 08/03/2018  . Osteomyelitis of vertebra of lumbar region ([place]) 08/02/2018  . Unsteady gait 08/02/2018  . Osteomyelitis of lumbar spine ([place]) 08/02/2018  . Coronary artery disease 01/25/2018  . Unspecified atrial fibrillation ([place]) 12/24/2017  . Hypothyroidism 12/24/2017  . Alcohol abuse 12/24/2017  . Hyponatremia 12/23/2017  . Abnormal nuclear stress test 02/09/2015  . PAD (peripheral artery disease) ([place]) 11/11/2013  . Critical lower limb ischemia - poorly healing ulcer on left foot with occluded iliac, femoral artery on left 10/30/2013  . Hypertension   . Heavy cigarette smoker (  20-39 per day)   . Hyperlipidemia LDL goal <70   . Screening for colon cancer 07/07/2011    Past Surgical History: Past Surgical History:  Procedure Laterality Date  . BACK SURGERY  02/2008  . CARDIAC CATHETERIZATION N/A 02/15/2015   Procedure: Left Heart Cath and Coronary Angiography;  Surgeon: Lorretta Harp, MD;  Location: [place] CV LAB;  Service: Cardiovascular;  Laterality: N/A;  . COLONOSCOPY  07/24/2011   Procedure: COLONOSCOPY;  Surgeon: Daneil Dolin, MD;  [facility];  Service:  Endoscopy;  Laterality: N/A;  8:15  . CORONARY STENT PLACEMENT    . ENDARTERECTOMY FEMORAL Left 11/11/2013   Procedure: Left Femoral Endarterectomy with Patch Angioplasty ;  Surgeon: Angelia Mould, MD;  Location: [place];  Service: Vascular;  Laterality: Left;  . ESOPHAGOGASTRODUODENOSCOPY N/A 10/09/2014   Procedure: ESOPHAGOGASTRODUODENOSCOPY (EGD);  Surgeon: Rogene Houston, MD;  [facility];  Service: Endoscopy;  Laterality: N/A;  230 - moved to 9:55 - Ann notified pt to arrive at 8:55  . ILIAC ARTERY STENT Left 11/03/2013   Dr. Judithann Sauger  . IR FLUORO GUIDED NEEDLE PLC ASPIRATION/INJECTION LOC  08/03/2018  . LOWER EXTREMITY ANGIOGRAM N/A 11/03/2013   Procedure: LOWER EXTREMITY ANGIOGRAM;  Surgeon: Lorretta Harp, MD;  Location: [facility] CATH LAB;  Service: Cardiovascular;  Laterality: N/A;  . LUMBAR MICRODISCECTOMY Left 02/2008   L4-5  . NM MYOCAR PERF WALL MOTION  07/2009   persantine - normal perfusion  . SURGERY SCROTAL / TESTICULAR Left    cyst excision  . THORACIC ESOPHAGUS REPLACEMENT      Allergies: Allergies  Allergen Reactions  . Glycopyrrolate Other (See Comments)    Intolerance  . Warfarin And Related Other (See Comments)    Excessive bleeding      Home Medications:  Current Outpatient Medications:  .  albuterol (PROAIR HFA) 108 (90 BASE) MCG/ACT inhaler, Inhale 2 puffs into the lungs every 4 (four) hours as needed for wheezing or shortness of breath. , Disp: , Rfl:  .  ALPRAZolam (XANAX) 1 MG tablet, Take 1 mg by mouth at bedtime as needed for anxiety. , Disp: , Rfl:  .  aspirin EC 81 MG tablet, Take 81 mg by mouth daily., Disp: , Rfl:  .  Cholecalciferol (VITAMIN D3) 2000 units capsule, Take 1 capsule by mouth daily., Disp: , Rfl:  .  diphenoxylate-atropine (LOMOTIL) 2.5-0.025 MG tablet, Take 1 tablet by mouth every 6 (six) hours as needed., Disp: , Rfl:  .  folic acid (FOLVITE) 1 MG tablet, Take 1 tablet (1 mg total) by mouth daily., Disp: 30 tablet, Rfl: 5 .   levothyroxine (SYNTHROID, LEVOTHROID) 25 MCG tablet, Take 2 tablets by mouth daily., Disp: , Rfl:  .  losartan (COZAAR) 50 MG tablet, Take 0.5 tablets (25 mg total) by mouth daily., Disp: 30 tablet, Rfl: 5 .  Oxycodone HCl 20 MG TABS, Take 1 tablet (20 mg total) by mouth every 4 (four) hours as needed (pain)., Disp: 30 tablet, Rfl: 0 .  propranolol (INDERAL) 20 MG tablet, Take 20 mg by mouth 2 (two) times daily. , Disp: , Rfl:  .  rivaroxaban (XARELTO) 20 MG TABS tablet, Take 1 tablet (20 mg total) by mouth daily with supper., Disp: 30 tablet, Rfl: 1 .  Tiotropium Bromide-Olodaterol (STIOLTO RESPIMAT) 2.5-2.5 MCG/ACT AERS, Inhale 2 puffs into the lungs daily., Disp: , Rfl:  .  nicotine (NICODERM CQ - DOSED IN MG/24 HOURS) 21 mg/24hr patch, Place 1 patch (  21 mg total) onto the skin daily. (Patient not taking: Reported on 07/03/2019), Disp: 28 patch, Rfl: 0   Family History: family history includes Heart attack (age of onset: 13) in his father; Stroke in his mother; Stroke (age of onset: 59) in his brother; Sudden death in his father.    Social History:   reports that he quit smoking about 5 years ago. His smoking use included cigarettes. He has a 40.00 pack-year smoking history. He has never used smokeless tobacco. He reports current alcohol use of about 21.0 standard drinks of alcohol per week. He reports that he does not use drugs.   Review of Systems: Constitutional: Denies weight loss/weight gain  Eyes: No changes in vision. ENT: No oral lesions, sore throat.  GI: see HPI.  Heme/Lymph: No easy bruising.  CV: No chest pain.  GU: No hematuria.  Integumentary: No rashes.  Neuro: No headaches.  Psych: No depression/anxiety.  Endocrine: No heat/cold intolerance.  Allergic/Immunologic: No urticaria.  Resp: No cough, SOB.  Musculoskeletal: No joint swelling.    Physical Examination: Ht 5\' 7"  (1.702 m)   Wt 143 lb 8 oz (65.1 kg)   BMI 22.48 kg/m  Virtual telephone visit - unable to get  BP, etc     Data:  CT chest/a/p 04/2019-1. No evidence of locally recurrent or metastatic disease in this patient status post esophagectomy and gastric pull-through for treatment of esophageal cancer. 2. Overall similar extent of scattered upper lobe predominant groundglass opacities throughout the lungs. Differential considerations again include infectious or inflammatory etiologies, however in the absence of the clinical features to suggest infection drug toxicity is an additional consideration. 3. Similar vertebral body height loss and sclerosis at L1-L2.  4. 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  5. Partially imaged soft tissue lesion superficial to the right sternocleidomastoid muscle. This could reflect a lymph node or parotid tissue, however is incompletely evaluated. Correlate with physical exam. 6. Additional findings as above  Assessment/Plan: [name] is a 59 y.o. male  [place] was seen today for new patient (initial visit).  Diagnoses and all orders for this visit:  Dysphagia, unspecified type  History of esophageal cancer      1. Dysphagia  - followed by [facility] and is s/p multiple esophageal dilations. Hx esophageal adenocarcinoma 2016 w/ s/p esophagectomy and gastric pull through. Currently not having dysphagia as long as he takes time eating. Last EGD about 6 months ago, per pt dilated 70mm.   He wants to know if any medication would reduce stricturing and need for frequent EGDs-will discuss w/ Dr Laural Golden possible role of low dose PPI. I did also encourage patient to decrease his alcohol intake of several drinks 3-4x/week.   2. Colon cancer screening - UTD, due 2023.   Will discuss case w/ Dr Laural Golden and contact pt w/ recommendations.   I personally performed the service, non-incident to. (WP)  Laurine Blazer, [facility]

## 2019-07-09 ENCOUNTER — Ambulatory Visit (HOSPITAL_COMMUNITY): Payer: Medicare Other

## 2019-07-10 ENCOUNTER — Telehealth (INDEPENDENT_AMBULATORY_CARE_PROVIDER_SITE_OTHER): Payer: Self-pay | Admitting: Gastroenterology

## 2019-07-10 ENCOUNTER — Other Ambulatory Visit (INDEPENDENT_AMBULATORY_CARE_PROVIDER_SITE_OTHER): Payer: Self-pay | Admitting: *Deleted

## 2019-07-10 DIAGNOSIS — Z8501 Personal history of malignant neoplasm of esophagus: Secondary | ICD-10-CM

## 2019-07-10 DIAGNOSIS — R1310 Dysphagia, unspecified: Secondary | ICD-10-CM

## 2019-07-10 DIAGNOSIS — G894 Chronic pain syndrome: Secondary | ICD-10-CM | POA: Diagnosis not present

## 2019-07-10 MED ORDER — PANTOPRAZOLE SODIUM 40 MG PO TBEC: 40.0000 mg | DELAYED_RELEASE_TABLET | Freq: Every day | ORAL | 1 refills | Status: DC

## 2019-07-10 NOTE — Telephone Encounter (Signed)
Patient left voice mail message stating he was to have a call back after you discussed his issues with Dr Laural Golden - please advise - [phone]

## 2019-07-10 NOTE — Telephone Encounter (Signed)
I called patient - case discussed in detail w/ Dr Laural Golden - records reviewed from [facility] - recommends repeat endoscopy given his last endoscopy was July 2020 and dilated to 15 mm.  Also recommends 40 mg PPI daily, I sent this to patient's pharmacy and he is aware.  Agrees with decreasing alcohol intake and reviewed this with patient. Goal to dilate to 16.71mm.     He has stopped xarelto for 2 days in past prior EGD w/o any issues - we will plan to hold Xarelto 48 hours prior. Reports on xarelto for atrial fib  Ann- He will need propofol for EGD per Dr. Laural Golden.

## 2019-07-11 ENCOUNTER — Encounter (INDEPENDENT_AMBULATORY_CARE_PROVIDER_SITE_OTHER): Payer: Self-pay

## 2019-07-11 ENCOUNTER — Other Ambulatory Visit (INDEPENDENT_AMBULATORY_CARE_PROVIDER_SITE_OTHER): Payer: Self-pay | Admitting: *Deleted

## 2019-07-11 ENCOUNTER — Other Ambulatory Visit: Payer: Self-pay

## 2019-07-11 ENCOUNTER — Ambulatory Visit: Payer: Medicare Other | Attending: Internal Medicine

## 2019-07-11 DIAGNOSIS — Z20822 Contact with and (suspected) exposure to covid-19: Secondary | ICD-10-CM | POA: Diagnosis not present

## 2019-07-11 NOTE — Telephone Encounter (Signed)
EGD w/ propofol sch'd 07/21/19, patient aware, verbal instructions given

## 2019-07-12 LAB — NOVEL CORONAVIRUS, NAA: SARS-CoV-2, NAA: NOT DETECTED

## 2019-07-14 ENCOUNTER — Telehealth (INDEPENDENT_AMBULATORY_CARE_PROVIDER_SITE_OTHER): Payer: Self-pay | Admitting: *Deleted

## 2019-07-14 ENCOUNTER — Other Ambulatory Visit (INDEPENDENT_AMBULATORY_CARE_PROVIDER_SITE_OTHER): Payer: Self-pay | Admitting: *Deleted

## 2019-07-14 NOTE — Telephone Encounter (Signed)
Please call patient, he has a question about meds you prescribed at [facility] [phone])

## 2019-07-14 NOTE — Telephone Encounter (Signed)
I talked to patient on the phone-he feels the Protonix is a large football shaped tablet and he may not be able to swallow.  He will contact the pharmacy to see if this is something he can crush or if there are any PPI alternatives easier to swallow and notify me.

## 2019-07-16 NOTE — Patient Instructions (Signed)
Maliek T [place]  07/16/2019     @PREFPERIOPPHARMACY @   Your procedure is scheduled on  07/21/2019.  Report to [facility] at  1030  A.M.  Call this number if you have problems the morning of surgery:  [phone]   Remember:  Follow the diet instructions given to you by Dr Olevia Perches office.                      Take these medicines the morning of surgery with A SIP OF WATER  Levothyroxine, losartan, oxycodone(if needed), protonix, propranolol. Use your inhaler before you come and bring it with you.    Do not wear jewelry, make-up or nail polish.  Do not wear lotions, powders, or perfumes. Please wear deodorant and brush your teeth.  Do not shave 48 hours prior to surgery.  Men may shave face and neck.  Do not bring valuables to the hospital.  [facility] is not responsible for any belongings or valuables.  Contacts, dentures or bridgework may not be worn into surgery.  Leave your suitcase in the car.  After surgery it may be brought to your room.  For patients admitted to the hospital, discharge time will be determined by your treatment team.  Patients discharged the day of surgery will not be allowed to drive home.   Name and phone number of your driver:   family Special instructions:  DO NOT drink any alcohol prior to your procedure 07/21/2019.  Please read over the following fact sheets that you were given. Anesthesia Post-op Instructions and Care and Recovery After Surgery       Upper Endoscopy, Adult, Care After This sheet gives you information about how to care for yourself after your procedure. Your health care provider may also give you more specific instructions. If you have problems or questions, contact your health care provider. What can I expect after the procedure? After the procedure, it is common to have:  A sore throat.  Mild stomach pain or discomfort.  Bloating.  Nausea. Follow these instructions at home:   Follow instructions from your  health care provider about what to eat or drink after your procedure.  Return to your normal activities as told by your health care provider. Ask your health care provider what activities are safe for you.  Take over-the-counter and prescription medicines only as told by your health care provider.  Do not drive for 24 hours if you were given a sedative during your procedure.  Keep all follow-up visits as told by your health care provider. This is important. Contact a health care provider if you have:  A sore throat that lasts longer than one day.  Trouble swallowing. Get help right away if:  You vomit blood or your vomit looks like coffee grounds.  You have: ? A fever. ? Bloody, black, or tarry stools. ? A severe sore throat or you cannot swallow. ? Difficulty breathing. ? Severe pain in your chest or abdomen. Summary  After the procedure, it is common to have a sore throat, mild stomach discomfort, bloating, and nausea.  Do not drive for 24 hours if you were given a sedative during the procedure.  Follow instructions from your health care provider about what to eat or drink after your procedure.  Return to your normal activities as told by your health care provider. This information is not intended to replace advice given to you by your health care provider. Make sure  you discuss any questions you have with your health care provider. Document Revised: 12/04/2017 Document Reviewed: 11/12/2017 Elsevier Patient Education  2020 [place] After These instructions provide you with information about caring for yourself after your procedure. Your health care provider may also give you more specific instructions. Your treatment has been planned according to current medical practices, but problems sometimes occur. Call your health care provider if you have any problems or questions after your procedure. What can I expect after the procedure? After  your procedure, you may:  Feel sleepy for several hours.  Feel clumsy and have poor balance for several hours.  Feel forgetful about what happened after the procedure.  Have poor judgment for several hours.  Feel nauseous or vomit.  Have a sore throat if you had a breathing tube during the procedure. Follow these instructions at home: For at least 24 hours after the procedure:      Have a responsible adult stay with you. It is important to have someone help care for you until you are awake and alert.  Rest as needed.  Do not: ? Participate in activities in which you could fall or become injured. ? Drive. ? Use heavy machinery. ? Drink alcohol. ? Take sleeping pills or medicines that cause drowsiness. ? Make important decisions or sign legal documents. ? Take care of children on your own. Eating and drinking  Follow the diet that is recommended by your health care provider.  If you vomit, drink water, juice, or soup when you can drink without vomiting.  Make sure you have little or no nausea before eating solid foods. General instructions  Take over-the-counter and prescription medicines only as told by your health care provider.  If you have sleep apnea, surgery and certain medicines can increase your risk for breathing problems. Follow instructions from your health care provider about wearing your sleep device: ? Anytime you are sleeping, including during daytime naps. ? While taking prescription pain medicines, sleeping medicines, or medicines that make you drowsy.  If you smoke, do not smoke without supervision.  Keep all follow-up visits as told by your health care provider. This is important. Contact a health care provider if:  You keep feeling nauseous or you keep vomiting.  You feel light-headed.  You develop a rash.  You have a fever. Get help right away if:  You have trouble breathing. Summary  For several hours after your procedure, you may  feel sleepy and have poor judgment.  Have a responsible adult stay with you for at least 24 hours or until you are awake and alert. This information is not intended to replace advice given to you by your health care provider. Make sure you discuss any questions you have with your health care provider. Document Revised: 09/10/2017 Document Reviewed: 10/03/2015 Elsevier Patient Education  [place].

## 2019-07-18 ENCOUNTER — Encounter (HOSPITAL_COMMUNITY): Payer: Self-pay

## 2019-07-18 ENCOUNTER — Encounter (HOSPITAL_COMMUNITY)
Admission: RE | Admit: 2019-07-18 | Discharge: 2019-07-18 | Disposition: A | Discharge status: Home / Self Care | Payer: Medicare Other | Source: Ambulatory Visit | Attending: Internal Medicine | Admitting: Internal Medicine

## 2019-07-18 ENCOUNTER — Other Ambulatory Visit: Payer: Self-pay

## 2019-07-18 ENCOUNTER — Other Ambulatory Visit (HOSPITAL_COMMUNITY)
Admission: RE | Admit: 2019-07-18 | Discharge: 2019-07-18 | Disposition: A | Discharge status: Home / Self Care | Payer: Medicare Other | Source: Ambulatory Visit | Attending: Internal Medicine | Admitting: Internal Medicine

## 2019-07-18 DIAGNOSIS — Z01812 Encounter for preprocedural laboratory examination: Secondary | ICD-10-CM | POA: Diagnosis not present

## 2019-07-18 DIAGNOSIS — Z20822 Contact with and (suspected) exposure to covid-19: Secondary | ICD-10-CM | POA: Diagnosis not present

## 2019-07-18 HISTORY — DX: Chronic obstructive pulmonary disease, unspecified: J44.9

## 2019-07-18 LAB — COMPREHENSIVE METABOLIC PANEL
ALT: 30 U/L (ref 0–44)
AST: 45 U/L — ABNORMAL HIGH (ref 15–41)
Albumin: 3.2 g/dL — ABNORMAL LOW (ref 3.5–5.0)
Alkaline Phosphatase: 78 U/L (ref 38–126)
Anion gap: 12 (ref 5–15)
BUN: 5 mg/dL — ABNORMAL LOW (ref 6–20)
CO2: 24 mmol/L (ref 22–32)
Calcium: 9.3 mg/dL (ref 8.9–10.3)
Chloride: 86 mmol/L — ABNORMAL LOW (ref 98–111)
Creatinine, Ser: 0.69 mg/dL (ref 0.61–1.24)
GFR calc Af Amer: >60 mL/min (ref >60)
GFR calc non Af Amer: >60 mL/min (ref >60)
Glucose, Bld: 112 mg/dL — ABNORMAL HIGH (ref 70–99)
Potassium: 3.9 mmol/L (ref 3.5–5.1)
Sodium: 122 mmol/L — ABNORMAL LOW (ref 135–145)
Total Bilirubin: 1.1 mg/dL (ref 0.3–1.2)
Total Protein: 7 g/dL (ref 6.5–8.1)

## 2019-07-18 LAB — CBC WITH DIFFERENTIAL/PLATELET
Abs Immature Granulocytes: 0.02 10*3/uL (ref 0.00–0.07)
Basophils Absolute: 0 10*3/uL (ref 0.0–0.1)
Basophils Relative: 1 %
Eosinophils Absolute: 0.1 10*3/uL (ref 0.0–0.5)
Eosinophils Relative: 2 %
HCT: 33.6 % — ABNORMAL LOW (ref 39.0–52.0)
Hemoglobin: 11.6 g/dL — ABNORMAL LOW (ref 13.0–17.0)
Immature Granulocytes: 1 %
Lymphocytes Relative: 22 %
Lymphs Abs: 0.9 10*3/uL (ref 0.7–4.0)
MCH: 33.9 pg (ref 26.0–34.0)
MCHC: 34.5 g/dL (ref 30.0–36.0)
MCV: 98.2 fL (ref 80.0–100.0)
Monocytes Absolute: 0.3 10*3/uL (ref 0.1–1.0)
Monocytes Relative: 8 %
Neutro Abs: 2.7 10*3/uL (ref 1.7–7.7)
Neutrophils Relative %: 66 %
Platelets: 200 10*3/uL (ref 150–400)
RBC: 3.42 MIL/uL — ABNORMAL LOW (ref 4.22–5.81)
RDW: 12.5 % (ref 11.5–15.5)
WBC: 4 10*3/uL (ref 4.0–10.5)
nRBC: 0 % (ref 0.0–0.2)

## 2019-07-18 LAB — SARS CORONAVIRUS 2 (TAT 6-24 HRS): SARS Coronavirus 2: NEGATIVE

## 2019-07-21 ENCOUNTER — Ambulatory Visit (HOSPITAL_COMMUNITY)
Admission: RE | Admit: 2019-07-21 | Discharge: 2019-07-21 | Disposition: A | Discharge status: Home / Self Care | Payer: Medicare Other | Attending: Internal Medicine | Admitting: Internal Medicine

## 2019-07-21 ENCOUNTER — Ambulatory Visit (HOSPITAL_COMMUNITY): Payer: Medicare Other | Admitting: Anesthesiology

## 2019-07-21 ENCOUNTER — Encounter (HOSPITAL_COMMUNITY): Payer: Self-pay | Admitting: Internal Medicine

## 2019-07-21 ENCOUNTER — Other Ambulatory Visit: Payer: Self-pay

## 2019-07-21 ENCOUNTER — Encounter (HOSPITAL_COMMUNITY)
Admission: RE | Disposition: A | Discharge status: Home / Self Care | Payer: Self-pay | Source: Home / Self Care | Attending: Internal Medicine

## 2019-07-21 DIAGNOSIS — F419 Anxiety disorder, unspecified: Secondary | ICD-10-CM | POA: Diagnosis not present

## 2019-07-21 DIAGNOSIS — Z79890 Hormone replacement therapy (postmenopausal): Secondary | ICD-10-CM | POA: Diagnosis not present

## 2019-07-21 DIAGNOSIS — E039 Hypothyroidism, unspecified: Secondary | ICD-10-CM | POA: Insufficient documentation

## 2019-07-21 DIAGNOSIS — I739 Peripheral vascular disease, unspecified: Secondary | ICD-10-CM | POA: Diagnosis not present

## 2019-07-21 DIAGNOSIS — K222 Esophageal obstruction: Secondary | ICD-10-CM | POA: Diagnosis not present

## 2019-07-21 DIAGNOSIS — Z79899 Other long term (current) drug therapy: Secondary | ICD-10-CM | POA: Insufficient documentation

## 2019-07-21 DIAGNOSIS — Z9889 Other specified postprocedural states: Secondary | ICD-10-CM

## 2019-07-21 DIAGNOSIS — R1314 Dysphagia, pharyngoesophageal phase: Secondary | ICD-10-CM

## 2019-07-21 DIAGNOSIS — Z8501 Personal history of malignant neoplasm of esophagus: Secondary | ICD-10-CM | POA: Diagnosis not present

## 2019-07-21 DIAGNOSIS — Z8249 Family history of ischemic heart disease and other diseases of the circulatory system: Secondary | ICD-10-CM | POA: Insufficient documentation

## 2019-07-21 DIAGNOSIS — E785 Hyperlipidemia, unspecified: Secondary | ICD-10-CM | POA: Insufficient documentation

## 2019-07-21 DIAGNOSIS — I10 Essential (primary) hypertension: Secondary | ICD-10-CM | POA: Diagnosis not present

## 2019-07-21 DIAGNOSIS — F17210 Nicotine dependence, cigarettes, uncomplicated: Secondary | ICD-10-CM | POA: Insufficient documentation

## 2019-07-21 DIAGNOSIS — Z955 Presence of coronary angioplasty implant and graft: Secondary | ICD-10-CM | POA: Insufficient documentation

## 2019-07-21 DIAGNOSIS — Z7982 Long term (current) use of aspirin: Secondary | ICD-10-CM | POA: Diagnosis not present

## 2019-07-21 DIAGNOSIS — R1310 Dysphagia, unspecified: Secondary | ICD-10-CM

## 2019-07-21 DIAGNOSIS — K9189 Other postprocedural complications and disorders of digestive system: Secondary | ICD-10-CM

## 2019-07-21 DIAGNOSIS — I2510 Atherosclerotic heart disease of native coronary artery without angina pectoris: Secondary | ICD-10-CM | POA: Diagnosis not present

## 2019-07-21 DIAGNOSIS — J449 Chronic obstructive pulmonary disease, unspecified: Secondary | ICD-10-CM | POA: Diagnosis not present

## 2019-07-21 DIAGNOSIS — Z7901 Long term (current) use of anticoagulants: Secondary | ICD-10-CM | POA: Insufficient documentation

## 2019-07-21 HISTORY — PX: ESOPHAGOGASTRODUODENOSCOPY (EGD) WITH PROPOFOL: SHX5813

## 2019-07-21 SURGERY — ESOPHAGOGASTRODUODENOSCOPY (EGD) WITH PROPOFOL
Anesthesia: General

## 2019-07-21 MED ORDER — PROPOFOL 10 MG/ML IV BOLUS
INTRAVENOUS | Status: AC
  Filled 2019-07-21: qty 20

## 2019-07-21 MED ORDER — LACTATED RINGERS IV SOLN: INTRAVENOUS | Status: DC | PRN

## 2019-07-21 MED ORDER — MIDAZOLAM HCL 2 MG/2ML IJ SOLN: 0.5000 mg | Freq: Once | INTRAMUSCULAR | Status: DC | PRN

## 2019-07-21 MED ORDER — HYDROMORPHONE HCL 1 MG/ML IJ SOLN: 0.2500 mg | INTRAMUSCULAR | Status: DC | PRN

## 2019-07-21 MED ORDER — LACTATED RINGERS IV SOLN: INTRAVENOUS | Status: DC

## 2019-07-21 MED ORDER — CHLORHEXIDINE GLUCONATE CLOTH 2 % EX PADS: 6.0000 | MEDICATED_PAD | Freq: Once | CUTANEOUS | Status: DC

## 2019-07-21 MED ORDER — MIDAZOLAM HCL 2 MG/2ML IJ SOLN
INTRAMUSCULAR | Status: AC
  Filled 2019-07-21: qty 2

## 2019-07-21 MED ORDER — PROPOFOL 10 MG/ML IV BOLUS
INTRAVENOUS | Status: DC | PRN
  Administered 2019-07-21: 20 mg via INTRAVENOUS

## 2019-07-21 MED ORDER — MIDAZOLAM HCL 5 MG/5ML IJ SOLN
INTRAMUSCULAR | Status: DC | PRN
  Administered 2019-07-21: 2 mg via INTRAVENOUS

## 2019-07-21 MED ORDER — PROMETHAZINE HCL 25 MG/ML IJ SOLN: 6.2500 mg | INTRAMUSCULAR | Status: DC | PRN

## 2019-07-21 MED ORDER — PROPOFOL 500 MG/50ML IV EMUL
INTRAVENOUS | Status: DC | PRN
  Administered 2019-07-21: 150 ug/kg/min via INTRAVENOUS

## 2019-07-21 MED ORDER — HYDROCODONE-ACETAMINOPHEN 7.5-325 MG PO TABS: 1.0000 | ORAL_TABLET | Freq: Once | ORAL | Status: DC | PRN

## 2019-07-21 MED ORDER — KETAMINE HCL 50 MG/5ML IJ SOSY
PREFILLED_SYRINGE | INTRAMUSCULAR | Status: AC
  Filled 2019-07-21: qty 5

## 2019-07-21 MED ORDER — KETAMINE HCL 10 MG/ML IJ SOLN
INTRAMUSCULAR | Status: DC | PRN
  Administered 2019-07-21: 10 mg via INTRAVENOUS

## 2019-07-21 MED ORDER — LIDOCAINE HCL 1 % IJ SOLN
INTRAMUSCULAR | Status: DC | PRN
  Administered 2019-07-21: 60 mg via INTRADERMAL

## 2019-07-21 NOTE — Discharge Instructions (Signed)
Resume aspirin and Xarelto on 07/24/2019. Resume other medications as before. Mechanical soft diet. Continue fluid restriction 50 ounces per day. No driving for 24 hours. Will check serum sodium in 1 week. Repeat dilation in 4 to 6 weeks.     Upper Endoscopy, Adult, Care After This sheet gives you information about how to care for yourself after your procedure. Your health care provider may also give you more specific instructions. If you have problems or questions, contact your health care provider. What can I expect after the procedure? After the procedure, it is common to have:  A sore throat.  Mild stomach pain or discomfort.  Bloating.  Nausea. Follow these instructions at home:   Follow instructions from your health care provider about what to eat or drink after your procedure.  Return to your normal activities as told by your health care provider. Ask your health care provider what activities are safe for you.  Take over-the-counter and prescription medicines only as told by your health care provider.  Do not drive for 24 hours if you were given a sedative during your procedure.  Keep all follow-up visits as told by your health care provider. This is important. Contact a health care provider if you have:  A sore throat that lasts longer than one day.  Trouble swallowing. Get help right away if:  You vomit blood or your vomit looks like coffee grounds.  You have: ? A fever. ? Bloody, black, or tarry stools. ? A severe sore throat or you cannot swallow. ? Difficulty breathing. ? Severe pain in your chest or abdomen. Summary  After the procedure, it is common to have a sore throat, mild stomach discomfort, bloating, and nausea.  Do not drive for 24 hours if you were given a sedative during the procedure.  Follow instructions from your health care provider about what to eat or drink after your procedure.  Return to your normal activities as told by your  health care provider. This information is not intended to replace advice given to you by your health care provider. Make sure you discuss any questions you have with your health care provider. Document Revised: 12/04/2017 Document Reviewed: 11/12/2017 Elsevier Patient Education  2020 [place] After These instructions provide you with information about caring for yourself after your procedure. Your health care provider may also give you more specific instructions. Your treatment has been planned according to current medical practices, but problems sometimes occur. Call your health care provider if you have any problems or questions after your procedure. What can I expect after the procedure? After your procedure, you may:  Feel sleepy for several hours.  Feel clumsy and have poor balance for several hours.  Feel forgetful about what happened after the procedure.  Have poor judgment for several hours.  Feel nauseous or vomit.  Have a sore throat if you had a breathing tube during the procedure. Follow these instructions at home: For at least 24 hours after the procedure:      Have a responsible adult stay with you. It is important to have someone help care for you until you are awake and alert.  Rest as needed.  Do not: ? Participate in activities in which you could fall or become injured. ? Drive. ? Use heavy machinery. ? Drink alcohol. ? Take sleeping pills or medicines that cause drowsiness. ? Make important decisions or sign legal documents. ? Take care of children on  your own. Eating and drinking  Follow the diet that is recommended by your health care provider.  If you vomit, drink water, juice, or soup when you can drink without vomiting.  Make sure you have little or no nausea before eating solid foods. General instructions  Take over-the-counter and prescription medicines only as told by your health care  provider.  If you have sleep apnea, surgery and certain medicines can increase your risk for breathing problems. Follow instructions from your health care provider about wearing your sleep device: ? Anytime you are sleeping, including during daytime naps. ? While taking prescription pain medicines, sleeping medicines, or medicines that make you drowsy.  If you smoke, do not smoke without supervision.  Keep all follow-up visits as told by your health care provider. This is important. Contact a health care provider if:  You keep feeling nauseous or you keep vomiting.  You feel light-headed.  You develop a rash.  You have a fever. Get help right away if:  You have trouble breathing. Summary  For several hours after your procedure, you may feel sleepy and have poor judgment.  Have a responsible adult stay with you for at least 24 hours or until you are awake and alert. This information is not intended to replace advice given to you by your health care provider. Make sure you discuss any questions you have with your health care provider. Document Revised: 09/10/2017 Document Reviewed: 10/03/2015 Elsevier Patient Education  [place] A soft-food eating plan includes foods that are safe and easy to chew and swallow. Your health care provider or dietitian can help you find foods and flavors that fit into this plan. Follow this plan until your health care provider or dietitian says it is safe to start eating other foods and food textures. What are tips for following this plan? General guidelines  Take small bites of food, or cut food into pieces about  inch or smaller. Bite-sized pieces of food are easier to chew and swallow. Eat moist foods. Avoid overly dry foods. Avoid foods that: Are difficult to swallow, such as dry, chunky, crispy, or sticky foods. Are difficult to chew, such as hard, tough, or stringy foods. Contain nuts, seeds, or  fruits. Follow instructions from your dietitian about the types of liquids that are safe for you to swallow. You may be allowed to have: Thick liquids only. This includes only liquids that are thicker than honey. Thin and thick liquids. This includes all beverages and foods that become liquid at room temperature. To make thick liquids: Purchase a commercial liquid thickening powder. These are available at grocery stores and pharmacies. Mix the thickener into liquids according to instructions on the label. Purchase ready-made thickened liquids. Thicken soup by pureeing, straining to remove chunks, and adding flour, potato flakes, or corn starch. Add commercial thickener to foods that become liquid at room temperature, such as milk shakes, yogurt, ice cream, gelatin, and sherbet. Ask your health care provider whether you need to take a fiber supplement. Cooking Cook meats so they stay tender and moist. Use methods like braising, stewing, or baking in liquid. Cook vegetables and fruit until they are soft enough to be mashed with a fork. Peel soft, fresh fruits such as peaches, nectarines, and melons. When making soup, make sure chunks of meat and vegetables are smaller than  inch. Reheat leftover foods slowly so that a tough crust does not form. What foods are allowed? The items  listed below may not be a complete list. Talk with your dietitian about what dietary choices are best for you. Grains Breads, muffins, pancakes, or waffles moistened with syrup, jelly, or butter. Dry cereals well-moistened with milk. Moist, cooked cereals. Well-cooked pasta and rice. Vegetables All soft-cooked vegetables. Shredded lettuce. Fruits All canned and cooked fruits. Soft, peeled fresh fruits. Strawberries. Dairy Milk. Cream. Yogurt. Cottage cheese. Soft cheese without the rind. Meats and other protein foods Tender, moist ground meat, poultry, or fish. Meat cooked in gravy or sauces. Eggs. Sweets and  desserts Ice cream. Milk shakes. Sherbet. Pudding. Fats and oils Butter. Margarine. Olive, canola, sunflower, and grapeseed oil. Smooth salad dressing. Smooth cream cheese. Mayonnaise. Gravy. What foods are not allowed? The items listed bemay not be a complete list. Talk with your dietitian about what dietary choices are best for you. Grains Coarse or dry cereals, such as bran, granola, and shredded wheat. Tough or chewy crusty breads, such as [place] bread or baguettes. Breads with nuts, seeds, or fruit. Vegetables All raw vegetables. Cooked corn. Cooked vegetables that are tough or stringy. Tough, crisp, fried potatoes and potato skins. Fruits Fresh fruits with skins or seeds, or both, such as apples, pears, and grapes. Stringy, high-pulp fruits, such as papaya, pineapple, coconut, and mango. Fruit leather and all dried fruit. Dairy Yogurt with nuts or coconut. Meats and other protein foods Hard, dry sausages. Dry meat, poultry, or fish. Meats with gristle. Fish with bones. Fried meat or fish. Lunch meat and hotdogs. Nuts and seeds. Chunky peanut butter or other nut butters. Sweets and desserts Cakes or cookies that are very dry or chewy. Desserts with dried fruit, nuts, or coconut. Fried pastries. Very rich pastries. Fats and oils Cream cheese with fruit or nuts. Salad dressings with seeds or chunks. Summary A soft-food eating plan includes foods that are safe and easy to swallow. Generally, the foods should be soft enough to be mashed with a fork. Avoid foods that are dry, hard to chew, crunchy, sticky, stringy, or crispy. Ask your health care provider whether you need to thicken your liquids and if you need to take a fiber supplement. This information is not intended to replace advice given to you by your health care provider. Make sure you discuss any questions you have with your health care provider. Document Revised: 10/03/2018 Document Reviewed: 08/15/2016 Elsevier Patient Education   [place].

## 2019-07-21 NOTE — Anesthesia Preprocedure Evaluation (Signed)
Anesthesia Evaluation  Patient identified by MRN, date of birth, ID band Patient awake  General Assessment Comment:Reports difficulty swallowing after an anesthetic  Unable to name drug  Will ask Dr. Laural Golden if he remembers  Reviewed: Allergy & Precautions, NPO status , Patient's Chart, lab work & pertinent test results, reviewed documented beta blocker date and time   History of Anesthesia Complications (+) history of anesthetic complications  Airway Mallampati: II  TM Distance: >3 FB Neck ROM: Full    Dental no notable dental hx. (+) Poor Dentition Multiple rotten /Broken teeth :   Pulmonary COPD,  COPD inhaler, Current Smoker and Patient abstained from smoking.,    Pulmonary exam normal breath sounds clear to auscultation       Cardiovascular Exercise Tolerance: Good hypertension, Pt. on medications and Pt. 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, reports complications after  Here for EGD/poss dil    Endo/Other  Hypothyroidism   Renal/GU negative Renal ROS  negative genitourinary   Musculoskeletal negative musculoskeletal ROS (+)   Abdominal   Peds negative pediatric ROS (+)  Hematology negative hematology ROS (+)   Anesthesia Other Findings   Reproductive/Obstetrics negative OB ROS                             Anesthesia Physical Anesthesia Plan  ASA: III  Anesthesia Plan: General   Post-op Pain Management:    Induction: Intravenous  PONV Risk Score and Plan: 1 and Propofol infusion, TIVA and Treatment may vary due to age or medical condition  Airway  Management Planned: Nasal Cannula and Simple Face Mask  Additional Equipment:   Intra-op Plan:   Post-operative Plan:   Informed Consent: I have reviewed the patients History and Physical, chart, labs and discussed the procedure including the risks, benefits and alternatives for the proposed anesthesia with the patient or authorized representative who has indicated his/her understanding and acceptance.     Dental advisory given  Plan Discussed with: CRNA  Anesthesia Plan Comments: (Plan Full PPE use  Plan GA with GETA as needed d/w pt -WTP with same after Q&A)        Anesthesia Quick Evaluation

## 2019-07-21 NOTE — Transfer of Care (Signed)
Immediate Anesthesia Transfer of Care Note  Patient: Jimmy Tucker  Procedure(s) Performed: ESOPHAGOGASTRODUODENOSCOPY (EGD) WITH PROPOFOL (N/A )  Patient Location: PACU  Anesthesia Type:General  Level of Consciousness: awake  Airway & Oxygen Therapy: Patient Spontanous Breathing  Post-op Assessment: Report given to RN  Post vital signs: Reviewed  Last Vitals:  Vitals Value Taken Time  BP 105/50 07/21/19 1135  Temp    Pulse 77 07/21/19 1139  Resp 18 07/21/19 1139  SpO2 97 % 07/21/19 1139  Vitals shown include unvalidated device data.  Last Pain:  Vitals:   07/21/19 1110  PainSc: 8          Complications: No apparent anesthesia complications

## 2019-07-21 NOTE — Anesthesia Postprocedure Evaluation (Signed)
Anesthesia Post Note  Patient: Jimmy Tucker  Procedure(s) Performed: ESOPHAGOGASTRODUODENOSCOPY (EGD) WITH PROPOFOL (N/A )  Patient location during evaluation: PACU Anesthesia Type: General Level of consciousness: awake and alert and oriented Pain management: pain level controlled Vital Signs Assessment: post-procedure vital signs reviewed and stable Respiratory status: spontaneous breathing Cardiovascular status: blood pressure returned to baseline and stable Postop Assessment: no apparent nausea or vomiting Anesthetic complications: no     Last Vitals:  Vitals:   07/21/19 1042 07/21/19 1135  BP: 110/76 (!) 105/50  Pulse: 92 89  Resp: 20 18  Temp: 36.7 C (P) 36.7 C  SpO2: 95% 93%    Last Pain:  Vitals:   07/21/19 1110  PainSc: 8                  Tallia Moehring

## 2019-07-21 NOTE — Progress Notes (Signed)
Bleeding noted to left forearm laterally, area cleansed, small approximately 53mm skin avulsion noted. Covered with 2x2 and paper tape per patient request.  Patient requests paper tape or coban to be used for future procedures.  Noted in allergy.  Patient reports "all those other tapes will tear my skin".

## 2019-07-21 NOTE — Op Note (Signed)
[facility] Patient Name: Jimmy Tucker Procedure Date: 07/21/2019 10:51 AM MRN: [id number] Date of Birth: [birth year] Attending MD: Hildred Laser , MD CSN: [id number] Age: 59 Admit Type: Outpatient Procedure:                Upper GI endoscopy Indications:              Post-surgical anastomotic stenosis, For therapy of                            post-surgical anastomotic stenosis Providers:                Hildred Laser, MD, Otis Peak B. Sharon Seller, RN, Randa Spike, Technician Referring MD:             [name], MD Medicines:                Propofol per Anesthesia Complications:            No immediate complications. Estimated Blood Loss:     Estimated blood loss was minimal. Procedure:                Pre-Anesthesia Assessment:                           - Prior to the procedure, a History and Physical                            was performed, and patient medications and                            allergies were reviewed. The patient's tolerance of                            previous anesthesia was also reviewed. The risks                            and benefits of the procedure and the sedation                            options and risks were discussed with the patient.                            All questions were answered, and informed consent                            was obtained. Prior Anticoagulants: The patient                            last took Xarelto (rivaroxaban) 3 days prior to the                            procedure and has taken no previous anticoagulant  or antiplatelet agents except for aspirin. ASA                            Grade Assessment: III - A patient with severe                            systemic disease. After reviewing the risks and                            benefits, the patient was deemed in satisfactory                            condition to undergo the procedure.  After obtaining informed consent, the endoscope was                            passed under direct vision. Throughout the                            procedure, the patient's blood pressure, pulse, and                            oxygen saturations were monitored continuously. The                            GIF-H190 [id number]) was introduced through the                            mouth, and advanced to the second part of duodenum.                            The upper GI endoscopy was accomplished without                            difficulty. The patient tolerated the procedure                            well. Scope In: 11:15:43 AM Scope Out: 11:27:15 AM Total Procedure Duration: 0 hours 11 minutes 32 seconds  Findings:      The proximal esophagus was normal.      An esophago-gastric anastomosis was found at 23 cm from the       incisors.Stricture was highrade. Scope was advanced after dilation with       balloon dilator. A TTS dilator was passed through the scope. Dilation       with a 12-13.5-15 mm x 8 cm CRE balloon dilator was performed to 12 mm       and 15 mm. The dilation site was examined and showed mild mucosal       disruption, mild improvement in luminal narrowing and no perforation.      The entire examined stomach was normal.      The duodenal bulb and second portion of the duodenum were normal. Impression:               - Normal proximal esophagus.                           -  Highrade stricture at esophago-gastric                            anastomosis was found. Dilated from 12 to 13.5 mm.                           - Normal stomach.                           - Normal duodenal bulb and second portion of the                            duodenum.                           - No specimens collected. Moderate Sedation:      Per Anesthesia Care Recommendation:           - Patient has a contact number available for                            emergencies. The signs and symptoms of  potential                            delayed complications were discussed with the                            patient. Return to normal activities tomorrow.                            Written discharge instructions were provided to the                            patient.                           - Mechanical soft diet today.                           - Continue present medications.                           - Resume Xarelto (rivaroxaban) in 3 days at prior                            dose.                           - Repeat upper endoscopy in 4 to 6 weeks.                           - Metabolic- 7 in one week. Procedure Code(s):        --- Professional ---                           [phone], Esophagogastroduodenoscopy, flexible,  transoral; with transendoscopic balloon dilation of                            esophagus (less than 30 mm diameter) Diagnosis Code(s):        --- Professional ---                           [id number], Other specified postprocedural states                           K91.89, Other postprocedural complications and                            disorders of digestive system CPT copyright 2019 American Medical Association. All rights reserved. The codes documented in this report are preliminary and upon coder review may  be revised to meet current compliance requirements. Hildred Laser, MD Hildred Laser, MD 07/21/2019 11:42:37 AM This report has been signed electronically. Number of Addenda: 0

## 2019-07-21 NOTE — Addendum Note (Signed)
Addendum  created 07/21/19 1216 by Ollen Bowl, CRNA   Charge Capture section accepted

## 2019-07-21 NOTE — H&P (Signed)
Jimmy Tucker is an 59 y.o. male.   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  The stricture was last dilated by Dr. Harley Hallmark of [facility] in July 2020.  Patient says he did well for a few months now he is having difficulty again.  He is maintaining his weight.  He denies hematemesis melena or rectal bleeding. The stricture was last dilated to 15 mm. Patient's preop labs revealed serum sodium of 122.  Old records were reviewed and noted that he was diagnosed with SIADH per 2 years ago.  Patient was advised to restrict fluid to 50 ounces per day.  Serum sodium today is 125.  Past Medical History:  Diagnosis Date  . Abnormal nuclear stress test   . Anxiety   . Cancer ([place])   . Carotid artery disease ([place])   . Chronic back pain   . Chronic lower back pain    "L4-5"  . COPD (chronic obstructive pulmonary disease) ([place])   . Esophageal cancer ([place])   . Heavy cigarette smoker (20-39 per day)    Unmotivated  . Hyperlipidemia LDL goal <70    Intolerant to statins  . Hypertension   . Lumbar disc disease   . PAD (peripheral artery disease) ([place]) 10/30/2012   a) 2011 LEA Dopplers: Left ATA occlusion; b) 10/2013: LEA Dopplers - L Common Iliac & CFA occlusion - w/ short reconstitution at the branch point of the external and internal iliac, the external iliac is occluded through the common femoral and proximal SFA; reconstitutes in the proximal SFA. Two-vessel runoff beyond.; c) s/p LCIA Stent & CFA EA w/ patch angioplasty --> f/u doppler 12/01/2013 patent  . Pollen allergies   . Presence of stent in artery     Past Surgical History:  Procedure Laterality Date  . BACK SURGERY  02/2008  . CARDIAC CATHETERIZATION N/A 02/15/2015   Procedure: Left Heart  Cath and Coronary Angiography;  Surgeon: Lorretta Harp, MD;  Location: [place] CV [facility];  Service: Cardiovascular;  Laterality: N/A;  . COLONOSCOPY  07/24/2011   Procedure: COLONOSCOPY;  Surgeon: Daneil Dolin, MD;  [facility];  Service: Endoscopy;  Laterality: N/A;  8:15  . CORONARY STENT PLACEMENT    . ENDARTERECTOMY FEMORAL Left 11/11/2013   Procedure: Left Femoral Endarterectomy with Patch Angioplasty ;  Surgeon: Angelia Mould, MD;  Location: [place];  Service: Vascular;  Laterality: Left;  . ESOPHAGOGASTRODUODENOSCOPY N/A 10/09/2014   Procedure: ESOPHAGOGASTRODUODENOSCOPY (EGD);  Surgeon: Rogene Houston, MD;  [facility];  Service: Endoscopy;  Laterality: N/A;  230 - moved to 9:55 - Ann notified pt to arrive at 8:55  . ILIAC ARTERY STENT Left 11/03/2013   Dr. Judithann Sauger  . IR FLUORO GUIDED NEEDLE PLC ASPIRATION/INJECTION LOC  08/03/2018  . LOWER EXTREMITY ANGIOGRAM N/A 11/03/2013   Procedure: LOWER EXTREMITY ANGIOGRAM;  Surgeon: Lorretta Harp, MD;  Location: [facility] CATH LAB;  Service: Cardiovascular;  Laterality: N/A;  . LUMBAR MICRODISCECTOMY Left 02/2008   L4-5  . NM MYOCAR PERF WALL MOTION  07/2009   persantine - normal perfusion  . SURGERY SCROTAL / TESTICULAR Left    cyst excision  . THORACIC ESOPHAGUS REPLACEMENT      Family History  Problem Relation Age of Onset  . Heart attack Father 72  Cardiac arrest  . Sudden death Father   . Stroke Brother 38  . Stroke Mother   . Colon cancer Neg Hx    Social History:  reports that he has been smoking cigarettes. He has a 20.00 pack-year smoking history. He has never used smokeless tobacco. He reports current alcohol use of about 21.0 standard drinks of alcohol per week. He reports that he does not use drugs.  Allergies:  Allergies  Allergen Reactions  . Glycopyrrolate Other (See Comments)    Intolerance  . Warfarin And Related Other (See Comments)    Excessive bleeding    Medications Prior to  Admission  Medication Sig Dispense Refill  . albuterol (PROAIR HFA) 108 (90 BASE) MCG/ACT inhaler Inhale 2 puffs into the lungs every 4 (four) hours as needed for wheezing or shortness of breath.     . ALPRAZolam (XANAX) 1 MG tablet Take 1 mg by mouth at bedtime as needed for anxiety.     Marland Kitchen aspirin EC 81 MG tablet Take 81 mg by mouth daily.    . Cholecalciferol (VITAMIN D3) 2000 units capsule Take 1 capsule by mouth daily.    . diphenoxylate-atropine (LOMOTIL) 2.5-0.025 MG tablet Take 1 tablet by mouth every 6 (six) hours as needed.    . folic acid (FOLVITE) 1 MG tablet Take 1 tablet (1 mg total) by mouth daily. 30 tablet 5  . levothyroxine (SYNTHROID, LEVOTHROID) 25 MCG tablet Take 2 tablets by mouth daily.    Marland Kitchen losartan (COZAAR) 50 MG tablet Take 0.5 tablets (25 mg total) by mouth daily. 30 tablet 5  . Oxycodone HCl 20 MG TABS Take 1 tablet (20 mg total) by mouth every 4 (four) hours as needed (pain). 30 tablet 0  . pantoprazole (PROTONIX) 40 MG tablet Take 1 tablet (40 mg total) by mouth daily. 90 tablet 1  . propranolol (INDERAL) 20 MG tablet Take 20 mg by mouth 2 (two) times daily.     . rivaroxaban (XARELTO) 20 MG TABS tablet Take 1 tablet (20 mg total) by mouth daily with supper. 30 tablet 1  . Tiotropium Bromide-Olodaterol (STIOLTO RESPIMAT) 2.5-2.5 MCG/ACT AERS Inhale 2 puffs into the lungs daily.    . nicotine (NICODERM CQ - DOSED IN MG/24 HOURS) 21 mg/24hr patch Place 1 patch (21 mg total) onto the skin daily. (Patient not taking: Reported on 07/03/2019) 28 patch 0    No results found for this or any previous visit (from the past 48 hour(s)). No results found.  Review of Systems  There were no vitals taken for this visit. Physical Exam  Constitutional:  Well-developed thin Caucasian male in NAD.  HENT:  Mouth/Throat: Oropharynx is clear and moist.  Eyes: Conjunctivae are normal. No scleral icterus.  Neck:  He has small scar at base of neck on left side.  No adenopathy or  thyromegaly.  Cardiovascular: Normal rate, regular rhythm and normal heart sounds.  No murmur heard. Respiratory: Effort normal and breath sounds normal.  GI:  Abdomen is flat with upper midline scar.  It is soft and nontender with organomegaly or masses.  Neurological: He is alert.  Skin: Skin is warm and dry.     Assessment/Plan History of esophageal adenocarcinoma of the esophagus status post esophagectomy. Esophageal dysphagia. Patient with known esophagogastric anastomotic stricture.  Hildred Laser, MD 07/21/2019, 11:02 AM

## 2019-07-22 LAB — POCT I-STAT, CHEM 8
BUN: 4 mg/dL — ABNORMAL LOW (ref 6–20)
Calcium, Ion: 1.19 mmol/L (ref 1.15–1.40)
Chloride: 86 mmol/L — ABNORMAL LOW (ref 98–111)
Creatinine, Ser: 0.7 mg/dL (ref 0.61–1.24)
Glucose, Bld: 105 mg/dL — ABNORMAL HIGH (ref 70–99)
HCT: 37 % — ABNORMAL LOW (ref 39.0–52.0)
Hemoglobin: 12.6 g/dL — ABNORMAL LOW (ref 13.0–17.0)
Potassium: 4 mmol/L (ref 3.5–5.1)
Sodium: 125 mmol/L — ABNORMAL LOW (ref 135–145)
TCO2: 28 mmol/L (ref 22–32)

## 2019-07-23 ENCOUNTER — Other Ambulatory Visit (INDEPENDENT_AMBULATORY_CARE_PROVIDER_SITE_OTHER): Payer: Self-pay | Admitting: *Deleted

## 2019-07-23 DIAGNOSIS — E871 Hypo-osmolality and hyponatremia: Secondary | ICD-10-CM

## 2019-07-25 DIAGNOSIS — E782 Mixed hyperlipidemia: Secondary | ICD-10-CM | POA: Diagnosis not present

## 2019-07-25 DIAGNOSIS — M1991 Primary osteoarthritis, unspecified site: Secondary | ICD-10-CM | POA: Diagnosis not present

## 2019-07-25 DIAGNOSIS — I10 Essential (primary) hypertension: Secondary | ICD-10-CM | POA: Diagnosis not present

## 2019-07-28 ENCOUNTER — Other Ambulatory Visit (INDEPENDENT_AMBULATORY_CARE_PROVIDER_SITE_OTHER): Payer: Self-pay | Admitting: *Deleted

## 2019-07-28 ENCOUNTER — Encounter (INDEPENDENT_AMBULATORY_CARE_PROVIDER_SITE_OTHER): Payer: Self-pay | Admitting: *Deleted

## 2019-07-28 DIAGNOSIS — K222 Esophageal obstruction: Secondary | ICD-10-CM

## 2019-07-28 DIAGNOSIS — R1310 Dysphagia, unspecified: Secondary | ICD-10-CM

## 2019-08-01 ENCOUNTER — Other Ambulatory Visit (INDEPENDENT_AMBULATORY_CARE_PROVIDER_SITE_OTHER): Payer: Self-pay | Admitting: *Deleted

## 2019-08-05 DIAGNOSIS — I10 Essential (primary) hypertension: Secondary | ICD-10-CM | POA: Diagnosis not present

## 2019-08-05 DIAGNOSIS — Z6822 Body mass index (BMI) 22.0-22.9, adult: Secondary | ICD-10-CM | POA: Diagnosis not present

## 2019-08-05 DIAGNOSIS — M1991 Primary osteoarthritis, unspecified site: Secondary | ICD-10-CM | POA: Diagnosis not present

## 2019-08-05 DIAGNOSIS — G894 Chronic pain syndrome: Secondary | ICD-10-CM | POA: Diagnosis not present

## 2019-08-05 DIAGNOSIS — Z1389 Encounter for screening for other disorder: Secondary | ICD-10-CM | POA: Diagnosis not present

## 2019-08-05 DIAGNOSIS — Z0001 Encounter for general adult medical examination with abnormal findings: Secondary | ICD-10-CM | POA: Diagnosis not present

## 2019-08-21 ENCOUNTER — Other Ambulatory Visit (INDEPENDENT_AMBULATORY_CARE_PROVIDER_SITE_OTHER): Payer: Self-pay | Admitting: *Deleted

## 2019-08-27 ENCOUNTER — Encounter (HOSPITAL_COMMUNITY)
Admission: RE | Admit: 2019-08-27 | Discharge: 2019-08-27 | Disposition: A | Discharge status: Home / Self Care | Payer: Medicare Other | Source: Ambulatory Visit | Attending: Internal Medicine | Admitting: Internal Medicine

## 2019-08-27 ENCOUNTER — Other Ambulatory Visit (HOSPITAL_COMMUNITY)
Admission: RE | Admit: 2019-08-27 | Discharge: 2019-08-27 | Disposition: A | Discharge status: Home / Self Care | Payer: Medicare Other | Source: Ambulatory Visit | Attending: Internal Medicine | Admitting: Internal Medicine

## 2019-08-27 ENCOUNTER — Other Ambulatory Visit: Payer: Self-pay

## 2019-08-27 DIAGNOSIS — Z01812 Encounter for preprocedural laboratory examination: Secondary | ICD-10-CM | POA: Insufficient documentation

## 2019-08-27 DIAGNOSIS — Z20822 Contact with and (suspected) exposure to covid-19: Secondary | ICD-10-CM | POA: Diagnosis not present

## 2019-08-27 LAB — SARS CORONAVIRUS 2 (TAT 6-24 HRS): SARS Coronavirus 2: NEGATIVE

## 2019-08-29 ENCOUNTER — Encounter (HOSPITAL_COMMUNITY): Payer: Self-pay | Admitting: Internal Medicine

## 2019-08-29 ENCOUNTER — Ambulatory Visit (HOSPITAL_COMMUNITY)
Admission: RE | Admit: 2019-08-29 | Discharge: 2019-08-29 | Disposition: A | Discharge status: Home / Self Care | Payer: Medicare Other | Attending: Internal Medicine | Admitting: Internal Medicine

## 2019-08-29 ENCOUNTER — Ambulatory Visit (HOSPITAL_COMMUNITY): Payer: Medicare Other | Admitting: Anesthesiology

## 2019-08-29 ENCOUNTER — Encounter (HOSPITAL_COMMUNITY)
Admission: RE | Disposition: A | Discharge status: Home / Self Care | Payer: Self-pay | Source: Home / Self Care | Attending: Internal Medicine

## 2019-08-29 DIAGNOSIS — Z955 Presence of coronary angioplasty implant and graft: Secondary | ICD-10-CM | POA: Diagnosis not present

## 2019-08-29 DIAGNOSIS — I2510 Atherosclerotic heart disease of native coronary artery without angina pectoris: Secondary | ICD-10-CM | POA: Insufficient documentation

## 2019-08-29 DIAGNOSIS — Z79899 Other long term (current) drug therapy: Secondary | ICD-10-CM | POA: Insufficient documentation

## 2019-08-29 DIAGNOSIS — I10 Essential (primary) hypertension: Secondary | ICD-10-CM | POA: Insufficient documentation

## 2019-08-29 DIAGNOSIS — E039 Hypothyroidism, unspecified: Secondary | ICD-10-CM | POA: Insufficient documentation

## 2019-08-29 DIAGNOSIS — R1314 Dysphagia, pharyngoesophageal phase: Secondary | ICD-10-CM | POA: Insufficient documentation

## 2019-08-29 DIAGNOSIS — F17210 Nicotine dependence, cigarettes, uncomplicated: Secondary | ICD-10-CM | POA: Diagnosis not present

## 2019-08-29 DIAGNOSIS — F419 Anxiety disorder, unspecified: Secondary | ICD-10-CM | POA: Insufficient documentation

## 2019-08-29 DIAGNOSIS — E871 Hypo-osmolality and hyponatremia: Secondary | ICD-10-CM | POA: Diagnosis not present

## 2019-08-29 DIAGNOSIS — J449 Chronic obstructive pulmonary disease, unspecified: Secondary | ICD-10-CM | POA: Diagnosis not present

## 2019-08-29 DIAGNOSIS — E785 Hyperlipidemia, unspecified: Secondary | ICD-10-CM | POA: Diagnosis not present

## 2019-08-29 DIAGNOSIS — Z8501 Personal history of malignant neoplasm of esophagus: Secondary | ICD-10-CM | POA: Insufficient documentation

## 2019-08-29 DIAGNOSIS — R1310 Dysphagia, unspecified: Secondary | ICD-10-CM

## 2019-08-29 DIAGNOSIS — K229 Disease of esophagus, unspecified: Secondary | ICD-10-CM | POA: Diagnosis not present

## 2019-08-29 DIAGNOSIS — Z79890 Hormone replacement therapy (postmenopausal): Secondary | ICD-10-CM | POA: Insufficient documentation

## 2019-08-29 DIAGNOSIS — Z7982 Long term (current) use of aspirin: Secondary | ICD-10-CM | POA: Insufficient documentation

## 2019-08-29 DIAGNOSIS — Z7901 Long term (current) use of anticoagulants: Secondary | ICD-10-CM | POA: Diagnosis not present

## 2019-08-29 DIAGNOSIS — Z9889 Other specified postprocedural states: Secondary | ICD-10-CM | POA: Diagnosis not present

## 2019-08-29 DIAGNOSIS — Z8249 Family history of ischemic heart disease and other diseases of the circulatory system: Secondary | ICD-10-CM | POA: Insufficient documentation

## 2019-08-29 DIAGNOSIS — I739 Peripheral vascular disease, unspecified: Secondary | ICD-10-CM | POA: Insufficient documentation

## 2019-08-29 DIAGNOSIS — K222 Esophageal obstruction: Secondary | ICD-10-CM

## 2019-08-29 HISTORY — PX: ESOPHAGOGASTRODUODENOSCOPY (EGD) WITH PROPOFOL: SHX5813

## 2019-08-29 HISTORY — PX: ESOPHAGEAL DILATION: SHX303

## 2019-08-29 LAB — POCT I-STAT, CHEM 8
BUN: <3 mg/dL — ABNORMAL LOW (ref 6–20)
Calcium, Ion: 1.14 mmol/L — ABNORMAL LOW (ref 1.15–1.40)
Chloride: 89 mmol/L — ABNORMAL LOW (ref 98–111)
Creatinine, Ser: 0.7 mg/dL (ref 0.61–1.24)
Glucose, Bld: 85 mg/dL (ref 70–99)
HCT: 30 % — ABNORMAL LOW (ref 39.0–52.0)
Hemoglobin: 10.2 g/dL — ABNORMAL LOW (ref 13.0–17.0)
Potassium: 3.5 mmol/L (ref 3.5–5.1)
Sodium: 127 mmol/L — ABNORMAL LOW (ref 135–145)
TCO2: 24 mmol/L (ref 22–32)

## 2019-08-29 SURGERY — ESOPHAGOGASTRODUODENOSCOPY (EGD) WITH PROPOFOL
Anesthesia: General

## 2019-08-29 MED ORDER — KETAMINE HCL 50 MG/5ML IJ SOSY
PREFILLED_SYRINGE | INTRAMUSCULAR | Status: AC
  Filled 2019-08-29: qty 5

## 2019-08-29 MED ORDER — CHLORHEXIDINE GLUCONATE CLOTH 2 % EX PADS: 6.0000 | MEDICATED_PAD | Freq: Once | CUTANEOUS | Status: DC

## 2019-08-29 MED ORDER — NYSTATIN 100000 UNIT/ML MT SUSP: 5.0000 mL | Freq: Four times a day (QID) | OROMUCOSAL | 2 refills | Status: DC

## 2019-08-29 MED ORDER — LACTATED RINGERS IV SOLN
Freq: Once | INTRAVENOUS | Status: AC
  Administered 2019-08-29: 1000 mL via INTRAVENOUS

## 2019-08-29 MED ORDER — PROPOFOL 10 MG/ML IV BOLUS
INTRAVENOUS | Status: AC
  Filled 2019-08-29: qty 20

## 2019-08-29 MED ORDER — PROPOFOL 500 MG/50ML IV EMUL
INTRAVENOUS | Status: DC | PRN
  Administered 2019-08-29: 125 ug/kg/min via INTRAVENOUS

## 2019-08-29 MED ORDER — KETAMINE HCL 10 MG/ML IJ SOLN
INTRAMUSCULAR | Status: DC | PRN
  Administered 2019-08-29: 10 mg via INTRAVENOUS
  Administered 2019-08-29: 30 mg via INTRAVENOUS

## 2019-08-29 MED ORDER — PROPOFOL 10 MG/ML IV BOLUS
INTRAVENOUS | Status: DC | PRN
  Administered 2019-08-29: 20 mg via INTRAVENOUS

## 2019-08-29 MED ORDER — PANTOPRAZOLE SODIUM 40 MG PO PACK: 40.0000 mg | PACK | Freq: Every day | ORAL | 11 refills | Status: DC

## 2019-08-29 MED ORDER — LIDOCAINE 2% (20 MG/ML) 5 ML SYRINGE
INTRAMUSCULAR | Status: AC
  Filled 2019-08-29: qty 5

## 2019-08-29 MED ORDER — STERILE WATER FOR IRRIGATION IR SOLN
Status: DC | PRN
  Administered 2019-08-29: 2.5 mL

## 2019-08-29 MED ORDER — LIDOCAINE HCL (CARDIAC) PF 100 MG/5ML IV SOSY
PREFILLED_SYRINGE | INTRAVENOUS | Status: DC | PRN
  Administered 2019-08-29: 20 mg via INTRATRACHEAL

## 2019-08-29 MED ORDER — LACTATED RINGERS IV SOLN: INTRAVENOUS | Status: DC | PRN

## 2019-08-29 NOTE — Op Note (Signed)
[facility] Patient Name: Jimmy Tucker Procedure Date: 08/29/2019 7:02 AM MRN: [id number] Date of Birth: 1961/01/01 Attending MD: Hildred Laser , MD CSN: [id number] Age: 59 Admit Type: Outpatient Procedure:                Upper GI endoscopy Indications:              Esophageal dysphagia Providers:                Hildred Laser, MD, Lurline Del, RN, Raphael Gibney,                            Technician Referring MD:             [name], MD Medicines:                Propofol per Anesthesia Complications:            No immediate complications. Estimated Blood Loss:     Estimated blood loss was minimal. Procedure:                Pre-Anesthesia Assessment:                           - Prior to the procedure, a History and Physical                            was performed, and patient medications and                            allergies were reviewed. The patient's tolerance of                            previous anesthesia was also reviewed. The risks                            and benefits of the procedure and the sedation                            options and risks were discussed with the patient.                            All questions were answered, and informed consent                            was obtained. Prior Anticoagulants: The patient                            last took Xarelto (rivaroxaban) 3 days prior to the                            procedure. ASA Grade Assessment: III - A patient                            with severe systemic disease. After reviewing the  risks and benefits, the patient was deemed in                            satisfactory condition to undergo the procedure.                           After obtaining informed consent, the endoscope was                            passed under direct vision. Throughout the                            procedure, the patient's blood pressure, pulse, and                            oxygen  saturations were monitored continuously. The                            GIF-H190 [id number] scope was introduced through the                            mouth, and advanced to the pylorus. The upper GI                            endoscopy was accomplished without difficulty. The                            patient tolerated the procedure well. Scope In: 7:38:27 AM Scope Out: 7:49:17 AM Total Procedure Duration: 0 hours 10 minutes 50 seconds  Findings:      The hypopharynx was normal.      Patchy plaques were found in the proximal esophagus.      A partial esophagectomy anastomosis was found at the esophageal       anastomosis. This was characterized by congestion, erythema and friable       mucosa. Scope could not be passed across it. A TTS dilator was passed       through the scope. Dilation with a 12-13.5-15 mm balloon dilator was       performed to 12 mm, 13.5 mm and 15 mm. The dilation site was examined       and showed moderate mucosal disruption, mild improvement in luminal       narrowing and no perforation.      The entire examined stomach was normal.      An examination of the duodenum was not performed. Impression:               - Normal hypopharynx.                           - Esophageal plaques were found, consistent with                            candidiasis.                           - A partial esophagectomy anastomosis was found,  characterized by congestion, erythema and friable                            mucosa. Anastamosis located at 24 cm from incisors.                            Dilated.                           - Normal stomach.                           - No specimens collected. Moderate Sedation:      Per Anesthesia Care      Per Anesthesia Care Recommendation:           - Patient has a contact number available for                            emergencies. The signs and symptoms of potential                            delayed  complications were discussed with the                            patient. Return to normal activities tomorrow.                            Written discharge instructions were provided to the                            patient.                           - Patient has a contact number available for                            emergencies. The signs and symptoms of potential                            delayed complications were discussed with the                            patient. Return to normal activities tomorrow.                            Written discharge instructions were provided to the                            patient.                           - Mechanical soft diet today.                           - Continue present medications.                           -  Resume Xarelto (rivaroxaban) in 3 days and                            previous antiplatelet medication in 3 days at prior                            doses.                           - Repeat upper endoscopy PRN. Procedure Code(s):        --- Professional ---                           [phone], 52, Esophagogastroduodenoscopy, flexible,                            transoral; with transendoscopic balloon dilation of                            esophagus (less than 30 mm diameter) Diagnosis Code(s):        --- Professional ---                           K22.9, Disease of esophagus, unspecified                           Z98.890, Other specified postprocedural states                           R13.14, Dysphagia, pharyngoesophageal phase CPT copyright 2019 American Medical Association. All rights reserved. The codes documented in this report are preliminary and upon coder review may  be revised to meet current compliance requirements. Hildred Laser, MD Hildred Laser, MD 08/29/2019 8:04:55 AM This report has been signed electronically. Number of Addenda: 0

## 2019-08-29 NOTE — Anesthesia Postprocedure Evaluation (Signed)
Anesthesia Post Note  Patient: Moishy Romm [place]  Procedure(s) Performed: ESOPHAGOGASTRODUODENOSCOPY (EGD) WITH PROPOFOL (N/A ) ESOPHAGEAL DILATION (N/A )  Patient location during evaluation: PACU Anesthesia Type: General Level of consciousness: awake and alert Pain management: pain level controlled Vital Signs Assessment: post-procedure vital signs reviewed and stable Respiratory status: spontaneous breathing, nonlabored ventilation and respiratory function stable Cardiovascular status: stable Postop Assessment: no apparent nausea or vomiting Anesthetic complications: no     Last Vitals:  Vitals:   08/29/19 0654  BP: (!) 107/58  Resp: 20  Temp: 36.6 C  SpO2: 99%    Last Pain:  Vitals:   08/29/19 0654  TempSrc: Oral  PainSc: [name]

## 2019-08-29 NOTE — Discharge Instructions (Signed)
Upper Endoscopy, Adult, Care After This sheet gives you information about how to care for yourself after your procedure. Your health care provider may also give you more specific instructions. If you have problems or questions, contact your health care provider. What can I expect after the procedure? After the procedure, it is common to have:  A sore throat.  Mild stomach pain or discomfort.  Bloating.  Nausea. Follow these instructions at home:   Follow instructions from your health care provider about what to eat or drink after your procedure.  Return to your normal activities as told by your health care provider. Ask your health care provider what activities are safe for you.  Take over-the-counter and prescription medicines only as told by your health care provider.  Do not drive for 24 hours if you were given a sedative during your procedure.  Keep all follow-up visits as told by your health care provider. This is important. Contact a health care provider if you have:  A sore throat that lasts longer than one day.  Trouble swallowing. Get help right away if:  You vomit blood or your vomit looks like coffee grounds.  You have: ? A fever. ? Bloody, black, or tarry stools. ? A severe sore throat or you cannot swallow. ? Difficulty breathing. ? Severe pain in your chest or abdomen. Summary  After the procedure, it is common to have a sore throat, mild stomach discomfort, bloating, and nausea.  Do not drive for 24 hours if you were given a sedative during the procedure.  Follow instructions from your health care provider about what to eat or drink after your procedure.  Return to your normal activities as told by your health care provider. This information is not intended to replace advice given to you by your health care provider. Make sure you discuss any questions you have with your health care provider. Document Revised: 12/04/2017 Document Reviewed:  11/12/2017 Elsevier Patient Education  [place] aspirin and Xarelto on 09/01/2019 Resume other medications as before. Pantoprazole liquid 40 mg by mouth 30 minutes before breakfast daily. Mycostatin suspension 1 teaspoonful swish and swallow 4 times a day for 10 days and thereafter 1 dose every night at bedtime. Mechanical soft diet.  Remember to eat slowly. Please call office with progress report in 1 week.

## 2019-08-29 NOTE — Anesthesia Preprocedure Evaluation (Signed)
Anesthesia Evaluation  Patient identified by MRN, date of birth, ID band Patient awake  General Assessment Comment:Reports difficulty swallowing after an anesthetic  Unable to name drug  Will ask Dr. Laural Golden if he remembers  Reviewed: Allergy & Precautions, NPO status , Patient's Chart, lab work & pertinent test results, reviewed documented beta blocker date and time   History of Anesthesia Complications (+) history of anesthetic complications  Airway Mallampati: II  TM Distance: >3 FB Neck ROM: Full    Dental no notable dental hx. (+) Poor Dentition, Dental Advisory Given Multiple rotten /Broken teeth :   Pulmonary COPD,  COPD inhaler, Current Smoker and Patient abstained from smoking.,  esophageal cancer, h/o tracheostomy   Pulmonary exam normal breath sounds clear to auscultation       Cardiovascular Exercise Tolerance: Good hypertension, Pt. on medications and Pt. on home beta blockers + CAD and + Peripheral Vascular Disease  Normal cardiovascular examI Rhythm:Regular Rate:Normal  Denies recent CP or ever using NTG H/o lower stent -kept on anticoagulation   Neuro/Psych PSYCHIATRIC DISORDERS Anxiety negative neurological ROS  negative psych ROS   GI/Hepatic Neg liver ROS, H/o Esophageal Ca s/p resection with stomach pull through, reports complications after  Here for EGD/poss dil    Endo/Other  Hypothyroidism   Renal/GU Renal disease (hyponatremia - 127)  negative genitourinary   Musculoskeletal negative musculoskeletal ROS (+)   Abdominal   Peds negative pediatric ROS (+)  Hematology   Anesthesia Other Findings   Reproductive/Obstetrics negative OB ROS                             Anesthesia Physical  Anesthesia Plan  ASA: III  Anesthesia Plan: General   Post-op Pain Management:    Induction: Intravenous  PONV Risk Score and Plan: 1 and TIVA  Airway Management Planned:  Nasal Cannula, Natural Airway and Simple Face Mask  Additional Equipment:   Intra-op Plan:   Post-operative Plan: Possible Post-op intubation/ventilation  Informed Consent: I have reviewed the patients History and Physical, chart, labs and discussed the procedure including the risks, benefits and alternatives for the proposed anesthesia with the patient or authorized representative who has indicated his/her understanding and acceptance.     Dental advisory given  Plan Discussed with: CRNA and Surgeon  Anesthesia Plan Comments:         Anesthesia Quick Evaluation

## 2019-08-29 NOTE — Transfer of Care (Signed)
Immediate Anesthesia Transfer of Care Note  Patient: Jimmy Tucker  Procedure(s) Performed: ESOPHAGOGASTRODUODENOSCOPY (EGD) WITH PROPOFOL (N/A ) ESOPHAGEAL DILATION (N/A )  Patient Location: PACU  Anesthesia Type:General  Level of Consciousness: awake  Airway & Oxygen Therapy: Patient Spontanous Breathing  Post-op Assessment: Report given to RN and Post -op Vital signs reviewed and stable  Post vital signs: Reviewed and stable  Last Vitals:  Vitals Value Taken Time  BP    Temp    Pulse 86 08/29/19 0800  Resp 13 08/29/19 0800  SpO2 98 % 08/29/19 0800  Vitals shown include unvalidated device data.  Last Pain:  Vitals:   08/29/19 0654  TempSrc: Oral  PainSc: 6       Patients Stated Pain Goal: 9 ([id number])  Complications: No apparent anesthesia complications

## 2019-08-29 NOTE — H&P (Signed)
Jimmy Tucker is an 59 y.o. male.   Chief Complaint: Patient is here for esophagogastroduodenoscopy and esophageal dilation. 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  He has developed anastomotic stricture requiring periodic dilation.  The stricture was dilated to 15 mm on 07/21/2019.  He returns with dysphagia.  Few days ago he had an episode of food impaction relieved spontaneously.  He is maintaining his weight. 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  Sodium today is 127.  He has had sodium levels less than that before. Patient has been off Xarelto and aspirin for 3 days.  Past Medical History:  Diagnosis Date  . Abnormal nuclear stress test   . Anxiety   . Cancer ([place])   . Carotid artery disease ([place])   . Chronic back pain   . Chronic lower back pain    "L4-5"  . COPD (chronic obstructive pulmonary disease) ([place])   . Esophageal cancer ([place])   . Heavy cigarette smoker (20-39 per day)    Unmotivated  . Hyperlipidemia LDL goal <70    Intolerant to statins  . Hypertension   . Lumbar disc disease   . PAD (peripheral artery disease) ([place]) 10/30/2012   a) 2011 LEA Dopplers: Left ATA occlusion; b) 10/2013: LEA Dopplers - L Common Iliac & CFA occlusion - w/ short reconstitution at the branch point of the external and internal iliac, the external iliac is occluded through the common femoral and proximal SFA; reconstitutes in the proximal SFA. Two-vessel runoff beyond.; c) s/p LCIA Stent & CFA EA w/ patch angioplasty --> f/u doppler 12/01/2013 patent  . Pollen allergies   . Presence of stent in artery     Past Surgical History:  Procedure Laterality Date  . BACK SURGERY  02/2008  . CARDIAC CATHETERIZATION N/A 02/15/2015   Procedure: Left Heart Cath and Coronary Angiography;  Surgeon: Lorretta Harp, MD;  Location: [facility];   Service: Cardiovascular;  Laterality: N/A;  . COLONOSCOPY  07/24/2011   Procedure: COLONOSCOPY;  Surgeon: Daneil Dolin, MD;  [facility];  Service: Endoscopy;  Laterality: N/A;  8:15  . CORONARY STENT PLACEMENT    . ENDARTERECTOMY FEMORAL Left 11/11/2013   Procedure: Left Femoral Endarterectomy with Patch Angioplasty ;  Surgeon: Angelia Mould, MD;  Location: [place];  Service: Vascular;  Laterality: Left;  . ESOPHAGOGASTRODUODENOSCOPY N/A 10/09/2014   Procedure: ESOPHAGOGASTRODUODENOSCOPY (EGD);  Surgeon: Rogene Houston, MD;  [facility];  Service: Endoscopy;  Laterality: N/A;  230 - moved to 9:55 - Ann notified pt to arrive at 8:55  . ESOPHAGOGASTRODUODENOSCOPY (EGD) WITH PROPOFOL N/A 07/21/2019   Procedure: ESOPHAGOGASTRODUODENOSCOPY (EGD) WITH PROPOFOL;  Surgeon: Rogene Houston, MD;  [facility];  Service: Endoscopy;  Laterality: N/A;  11:55  . ILIAC ARTERY STENT Left 11/03/2013   Dr. Judithann Sauger  . IR FLUORO GUIDED NEEDLE PLC ASPIRATION/INJECTION LOC  08/03/2018  . LOWER EXTREMITY ANGIOGRAM N/A 11/03/2013   Procedure: LOWER EXTREMITY ANGIOGRAM;  Surgeon: Lor[place]ta Harp, MD;  Location: [facility] CATH LAB;  Service: Cardiovascular;  Laterality: N/A;  . LUMBAR MICRODISCECTOMY Left 02/2008   L4-5  . NM MYOCAR PERF WALL MOTION  07/2009   persantine - normal perfusion  . SURGERY SCROTAL / TESTICULAR Left    cyst excision  . THORACIC ESOPHAGUS REPLACEMENT      Family  History  Problem Relation Age of Onset  . Heart attack Father 60       Cardiac arrest  . Sudden death Father   . Stroke Brother 58  . Stroke Mother   . Colon cancer Neg Hx    Social History:  reports that he has been smoking cigarettes. He has a 20.00 pack-year smoking history. He has never used smokeless tobacco. He reports current alcohol use of about 21.0 standard drinks of alcohol per week. He reports that he does not use drugs.  Allergies:  Allergies  Allergen Reactions  . Drug [Tape]      PLEASE USE COBAN ,,,, ADHESIVE TAPE TEARS SKIN   . Glycopyrrolate Other (See Comments)    Intolerance  . Warfarin And Related Other (See Comments)    Excessive bleeding    Medications Prior to Admission  Medication Sig Dispense Refill  . albuterol (PROAIR HFA) 108 (90 BASE) MCG/ACT inhaler Inhale 2 puffs into the lungs every 4 (four) hours as needed for wheezing or shortness of breath.     . ALPRAZolam (XANAX) 1 MG tablet Take 1 mg by mouth at bedtime as needed for anxiety.     Marland Kitchen aspirin EC 81 MG tablet Take 1 tablet (81 mg total) by mouth daily.    . Cholecalciferol (VITAMIN D3) 50 MCG (2000 UT) CHEW Chew 2,000 Units by mouth daily.     . diphenoxylate-atropine (LOMOTIL) 2.5-0.025 MG tablet Take 1 tablet by mouth every 6 (six) hours as needed for diarrhea or loose stools.     . folic acid (FOLVITE) 1 MG tablet Take 1 tablet (1 mg total) by mouth daily. 30 tablet 5  . levothyroxine (SYNTHROID) 50 MCG tablet Take 50 mcg by mouth daily before breakfast.     . losartan (COZAAR) 50 MG tablet Take 0.5 tablets (25 mg total) by mouth daily. 30 tablet 5  . Oxycodone HCl 20 MG TABS Take 1 tablet (20 mg total) by mouth every 4 (four) hours as needed (pain). 30 tablet 0  . pantoprazole (PROTONIX) 40 MG tablet Take 1 tablet (40 mg total) by mouth daily. 90 tablet 1  . propranolol (INDERAL) 20 MG tablet Take 20 mg by mouth 2 (two) times daily.     . rivaroxaban (XARELTO) 20 MG TABS tablet Take 1 tablet (20 mg total) by mouth daily with supper. 30 tablet 1  . Tiotropium Bromide-Olodaterol (STIOLTO RESPIMAT) 2.5-2.5 MCG/ACT AERS Inhale 2 puffs into the lungs daily.      Results for orders placed or performed during the hospital encounter of 08/29/19 (from the past 48 hour(s))  I-STAT, chem 8     Status: Abnormal   Collection Time: 08/29/19  7:15 AM  Result Value Ref Range   Sodium 127 (L) 135 - 145 mmol/L   Potassium 3.5 3.5 - 5.1 mmol/L   Chloride 89 (L) 98 - 111 mmol/L   BUN <3 (L) 6 - 20 mg/dL    Creatinine, Ser 0.70 0.61 - 1.24 mg/dL   Glucose, Bld 85 70 - 99 mg/dL    Comment: Glucose reference range applies only to samples taken after fasting for at least 8 hours.   Calcium, Ion 1.14 (L) 1.15 - 1.40 mmol/L   TCO2 24 22 - 32 mmol/L   Hemoglobin 10.2 (L) 13.0 - 17.0 g/dL   HCT 30.0 (L) 39.0 - 52.0 %   No results found.  Review of Systems  Blood pressure (!) 107/58, temperature 97.9 F (36.6 C), temperature source Oral,  resp. rate 20, SpO2 99 %. Physical Exam  Constitutional: He appears well-developed and well-nourished.  HENT:  Mouth/Throat: Oropharynx is clear and moist.  Eyes: Conjunctivae are normal. No scleral icterus.  Neck: No thyromegaly present.  Cardiovascular: Normal rate and regular rhythm.  Murmur heard. Faint systolic murmur at aortic area.  Respiratory: Effort normal and breath sounds normal.  GI:  He has upper midline scar.  Abdomen is soft.  He has mild tenderness across upper abdomen.  No organomegaly or masses.  Musculoskeletal:        General: No edema.  Lymphadenopathy:    He has no cervical adenopathy.  Neurological: He is alert.  Skin: Skin is warm and dry.     Assessment/Plan Esophageal dysphagia secondary to known anastomotic stricture. Esophagogastroduodenoscopy with esophageal dilation under monitored anesthesia care.  Hildred Laser, MD 08/29/2019, 7:24 AM

## 2019-09-05 DIAGNOSIS — K029 Dental caries, unspecified: Secondary | ICD-10-CM | POA: Diagnosis not present

## 2019-09-05 DIAGNOSIS — Z6822 Body mass index (BMI) 22.0-22.9, adult: Secondary | ICD-10-CM | POA: Diagnosis not present

## 2019-09-05 DIAGNOSIS — G894 Chronic pain syndrome: Secondary | ICD-10-CM | POA: Diagnosis not present

## 2019-09-05 DIAGNOSIS — K0530 Chronic periodontitis, unspecified: Secondary | ICD-10-CM | POA: Diagnosis not present

## 2019-09-23 DIAGNOSIS — E782 Mixed hyperlipidemia: Secondary | ICD-10-CM | POA: Diagnosis not present

## 2019-09-23 DIAGNOSIS — I4891 Unspecified atrial fibrillation: Secondary | ICD-10-CM | POA: Diagnosis not present

## 2019-09-23 DIAGNOSIS — M1991 Primary osteoarthritis, unspecified site: Secondary | ICD-10-CM | POA: Diagnosis not present

## 2019-09-23 DIAGNOSIS — I10 Essential (primary) hypertension: Secondary | ICD-10-CM | POA: Diagnosis not present

## 2019-10-17 DIAGNOSIS — I739 Peripheral vascular disease, unspecified: Secondary | ICD-10-CM | POA: Diagnosis not present

## 2019-10-17 DIAGNOSIS — E063 Autoimmune thyroiditis: Secondary | ICD-10-CM | POA: Diagnosis not present

## 2019-10-17 DIAGNOSIS — M1991 Primary osteoarthritis, unspecified site: Secondary | ICD-10-CM | POA: Diagnosis not present

## 2019-10-17 DIAGNOSIS — E7849 Other hyperlipidemia: Secondary | ICD-10-CM | POA: Diagnosis not present

## 2019-10-28 DIAGNOSIS — M1991 Primary osteoarthritis, unspecified site: Secondary | ICD-10-CM | POA: Diagnosis not present

## 2019-10-28 DIAGNOSIS — G894 Chronic pain syndrome: Secondary | ICD-10-CM | POA: Diagnosis not present

## 2019-10-28 DIAGNOSIS — E063 Autoimmune thyroiditis: Secondary | ICD-10-CM | POA: Diagnosis not present

## 2019-10-28 DIAGNOSIS — J449 Chronic obstructive pulmonary disease, unspecified: Secondary | ICD-10-CM | POA: Diagnosis not present

## 2019-11-06 DIAGNOSIS — C159 Malignant neoplasm of esophagus, unspecified: Secondary | ICD-10-CM | POA: Diagnosis not present

## 2019-11-06 DIAGNOSIS — E871 Hypo-osmolality and hyponatremia: Secondary | ICD-10-CM | POA: Diagnosis not present

## 2019-11-06 DIAGNOSIS — C160 Malignant neoplasm of cardia: Secondary | ICD-10-CM | POA: Diagnosis not present

## 2019-11-06 DIAGNOSIS — C155 Malignant neoplasm of lower third of esophagus: Secondary | ICD-10-CM | POA: Diagnosis not present

## 2019-11-28 DIAGNOSIS — I10 Essential (primary) hypertension: Secondary | ICD-10-CM | POA: Diagnosis not present

## 2019-11-28 DIAGNOSIS — Z6820 Body mass index (BMI) 20.0-20.9, adult: Secondary | ICD-10-CM | POA: Diagnosis not present

## 2019-11-28 DIAGNOSIS — J449 Chronic obstructive pulmonary disease, unspecified: Secondary | ICD-10-CM | POA: Diagnosis not present

## 2019-11-28 DIAGNOSIS — M1990 Unspecified osteoarthritis, unspecified site: Secondary | ICD-10-CM | POA: Diagnosis not present

## 2019-11-28 DIAGNOSIS — G894 Chronic pain syndrome: Secondary | ICD-10-CM | POA: Diagnosis not present

## 2019-12-17 ENCOUNTER — Other Ambulatory Visit (INDEPENDENT_AMBULATORY_CARE_PROVIDER_SITE_OTHER): Payer: Self-pay | Admitting: *Deleted

## 2019-12-17 DIAGNOSIS — E871 Hypo-osmolality and hyponatremia: Secondary | ICD-10-CM

## 2019-12-24 DIAGNOSIS — J449 Chronic obstructive pulmonary disease, unspecified: Secondary | ICD-10-CM | POA: Diagnosis not present

## 2019-12-24 DIAGNOSIS — E782 Mixed hyperlipidemia: Secondary | ICD-10-CM | POA: Diagnosis not present

## 2019-12-24 DIAGNOSIS — I10 Essential (primary) hypertension: Secondary | ICD-10-CM | POA: Diagnosis not present

## 2019-12-24 DIAGNOSIS — I4891 Unspecified atrial fibrillation: Secondary | ICD-10-CM | POA: Diagnosis not present

## 2020-01-27 DIAGNOSIS — E063 Autoimmune thyroiditis: Secondary | ICD-10-CM | POA: Diagnosis not present

## 2020-01-27 DIAGNOSIS — M1991 Primary osteoarthritis, unspecified site: Secondary | ICD-10-CM | POA: Diagnosis not present

## 2020-01-27 DIAGNOSIS — R1310 Dysphagia, unspecified: Secondary | ICD-10-CM | POA: Diagnosis not present

## 2020-03-22 DIAGNOSIS — M1991 Primary osteoarthritis, unspecified site: Secondary | ICD-10-CM | POA: Diagnosis not present

## 2020-03-22 DIAGNOSIS — G894 Chronic pain syndrome: Secondary | ICD-10-CM | POA: Diagnosis not present

## 2020-03-22 DIAGNOSIS — Z681 Body mass index (BMI) 19 or less, adult: Secondary | ICD-10-CM | POA: Diagnosis not present

## 2020-03-22 DIAGNOSIS — Z23 Encounter for immunization: Secondary | ICD-10-CM | POA: Diagnosis not present

## 2020-03-22 DIAGNOSIS — I10 Essential (primary) hypertension: Secondary | ICD-10-CM | POA: Diagnosis not present

## 2020-03-22 DIAGNOSIS — M4626 Osteomyelitis of vertebra, lumbar region: Secondary | ICD-10-CM | POA: Diagnosis not present

## 2020-04-16 DIAGNOSIS — G894 Chronic pain syndrome: Secondary | ICD-10-CM | POA: Diagnosis not present

## 2020-04-16 DIAGNOSIS — K056 Periodontal disease, unspecified: Secondary | ICD-10-CM | POA: Diagnosis not present

## 2020-04-16 DIAGNOSIS — M4626 Osteomyelitis of vertebra, lumbar region: Secondary | ICD-10-CM | POA: Diagnosis not present

## 2020-04-25 DIAGNOSIS — G894 Chronic pain syndrome: Secondary | ICD-10-CM | POA: Diagnosis not present

## 2020-04-25 DIAGNOSIS — I10 Essential (primary) hypertension: Secondary | ICD-10-CM | POA: Diagnosis not present

## 2020-05-13 DIAGNOSIS — E46 Unspecified protein-calorie malnutrition: Secondary | ICD-10-CM | POA: Diagnosis not present

## 2020-05-13 DIAGNOSIS — C159 Malignant neoplasm of esophagus, unspecified: Secondary | ICD-10-CM | POA: Diagnosis not present

## 2020-05-13 DIAGNOSIS — C160 Malignant neoplasm of cardia: Secondary | ICD-10-CM | POA: Diagnosis not present

## 2020-05-13 DIAGNOSIS — Z87891 Personal history of nicotine dependence: Secondary | ICD-10-CM | POA: Diagnosis not present

## 2020-05-13 DIAGNOSIS — C155 Malignant neoplasm of lower third of esophagus: Secondary | ICD-10-CM | POA: Diagnosis not present

## 2020-05-13 DIAGNOSIS — R42 Dizziness and giddiness: Secondary | ICD-10-CM | POA: Diagnosis not present

## 2020-05-13 DIAGNOSIS — R251 Tremor, unspecified: Secondary | ICD-10-CM | POA: Diagnosis not present

## 2020-05-13 DIAGNOSIS — E871 Hypo-osmolality and hyponatremia: Secondary | ICD-10-CM | POA: Diagnosis not present

## 2020-05-14 ENCOUNTER — Ambulatory Visit: Payer: Medicare Other | Admitting: Internal Medicine

## 2020-06-15 DIAGNOSIS — I4891 Unspecified atrial fibrillation: Secondary | ICD-10-CM | POA: Diagnosis not present

## 2020-06-15 DIAGNOSIS — E063 Autoimmune thyroiditis: Secondary | ICD-10-CM | POA: Diagnosis not present

## 2020-06-15 DIAGNOSIS — G894 Chronic pain syndrome: Secondary | ICD-10-CM | POA: Diagnosis not present

## 2020-07-06 DIAGNOSIS — I4891 Unspecified atrial fibrillation: Secondary | ICD-10-CM | POA: Diagnosis not present

## 2020-07-06 DIAGNOSIS — J449 Chronic obstructive pulmonary disease, unspecified: Secondary | ICD-10-CM | POA: Diagnosis not present

## 2020-07-28 DIAGNOSIS — K222 Esophageal obstruction: Secondary | ICD-10-CM | POA: Diagnosis not present

## 2020-07-28 DIAGNOSIS — G894 Chronic pain syndrome: Secondary | ICD-10-CM | POA: Diagnosis not present

## 2020-08-10 ENCOUNTER — Encounter (INDEPENDENT_AMBULATORY_CARE_PROVIDER_SITE_OTHER): Payer: Self-pay | Admitting: *Deleted

## 2020-09-01 ENCOUNTER — Encounter (INDEPENDENT_AMBULATORY_CARE_PROVIDER_SITE_OTHER): Payer: Self-pay | Admitting: Gastroenterology

## 2020-09-01 ENCOUNTER — Encounter (INDEPENDENT_AMBULATORY_CARE_PROVIDER_SITE_OTHER): Payer: Self-pay

## 2020-09-01 ENCOUNTER — Other Ambulatory Visit: Payer: Self-pay

## 2020-09-01 ENCOUNTER — Ambulatory Visit (INDEPENDENT_AMBULATORY_CARE_PROVIDER_SITE_OTHER): Payer: Medicare Other | Admitting: Gastroenterology

## 2020-09-01 VITALS — BP 98/62 | HR 84 | Temp 97.9°F | Ht 67.0 in | Wt 120.0 lb

## 2020-09-01 DIAGNOSIS — F1090 Alcohol use, unspecified, uncomplicated: Secondary | ICD-10-CM | POA: Insufficient documentation

## 2020-09-01 DIAGNOSIS — R1319 Other dysphagia: Secondary | ICD-10-CM | POA: Diagnosis not present

## 2020-09-01 DIAGNOSIS — K222 Esophageal obstruction: Secondary | ICD-10-CM

## 2020-09-01 DIAGNOSIS — Z7289 Other problems related to lifestyle: Secondary | ICD-10-CM

## 2020-09-01 DIAGNOSIS — R1310 Dysphagia, unspecified: Secondary | ICD-10-CM | POA: Insufficient documentation

## 2020-09-01 NOTE — Progress Notes (Signed)
Maylon Peppers, M.D. Gastroenterology & Hepatology [facility] [address] [place],  [address]  Primary Care Physician: [name], MD [address] [place] [address]  I will communicate my assessment and recommendations to the referring MD via EMR.  Problems: 1. Recurrent esophagogastric anastomotic stricture 2. History of esophageal adenocarcinoma s/p esophagectomy  History of Present Illness: Charlee Whitebread [place] is a 60 y.o. male with PMH esophageal adenocarcinoma s/p esophagectomy on 5188 at [facility] complicated by postoperative leak and recurrent strictures, COPD, HLD , HTN, chronic smoking and alcohol use, anxiety and PAD, who presents for follow up of dysphagia.  Patient reports that close to a month ago he presented rnew onset of cough and phlegm production episodes. States that he noticed that since then he has presented dysphagia to both solids and water. These episodes lasted for 3 days but he received a liquid medication (does not remember which) by his PCP and he states his dysphagia improved. However, he is presenting still dysphagia to solids such as bread which made him feel choked on something he has to vomit his food. He has recently changed his diet to soft diet as he is able to tolerate it better. He reports that he lost 14 lb in the last year as he has lost some of his teeth, which ha limited his food intake.  Nevertheless, he has good appetite. The patient denies having any nausea, heartburn, vomiting, fever, chills, hematochezia, melena, hematemesis, abdominal distention, abdominal pain, diarrhea, jaundice, pruritus.  In terms of his esophageal cancer, the patient is following at [facility].  He had a CT of the chest, abdomen and pelvis with IV contrast on 05/13/2020 which was completely unremarkable, without evidence of recurrence of malignancy.  Last EGD: 08/29/2019 - A partial esophagectomy  anastomosis was found at the esophageal anastomosis. This was characterized by congestion, erythema and friable mucosa. Scope could not be passed across it. A TTS dilator was passed through the scope. Dilation with a 12-13.5-15 mm balloon dilator was performed to 12 mm, 13.5 mm and 15 mm. The dilation site was examined and showed moderate mucosal disruption, mild improvement in luminal narrowing and no perforation. Last Colonoscopy: 2013 - pandivierticulosis, no polyps  FHx: neg for any gastrointestinal/liver disease, no malignancies Social: smokes 1/2 pack a day, drinks every day at least couple of beers but can go up to 4-5 drinks, neg illicit drug use Surgical: esophagectomy, back surgery   Past Medical History: Past Medical History:  Diagnosis Date  . Abnormal nuclear stress test   . Anxiety   . Cancer ([place])   . Carotid artery disease ([place])   . Chronic back pain   . Chronic lower back pain    "L4-5"  . COPD (chronic obstructive pulmonary disease) ([place])   . Esophageal cancer ([place])   . Heavy cigarette smoker (20-39 per day)    Unmotivated  . Hyperlipidemia LDL goal <70    Intolerant to statins  . Hypertension   . Lumbar disc disease   . PAD (peripheral artery disease) ([place]) 10/30/2012   a) 2011 LEA Dopplers: Left ATA occlusion; b) 10/2013: LEA Dopplers - L Common Iliac & CFA occlusion - w/ short reconstitution at the branch point of the external and internal iliac, the external iliac is occluded through the common femoral and proximal SFA; reconstitutes in the proximal SFA. Two-vessel runoff beyond.; c) s/p LCIA Stent & CFA EA w/ patch angioplasty --> f/u doppler 12/01/2013 patent  .  Pollen allergies   . Presence of stent in artery     Past Surgical History: Past Surgical History:  Procedure Laterality Date  . BACK SURGERY  02/2008  . CARDIAC CATHETERIZATION N/A 02/15/2015   Procedure: Left Heart Cath and Coronary Angiography;  Surgeon: Lorretta Harp, MD;  Location: [facility];  Service: Cardiovascular;  Laterality: N/A;  . COLONOSCOPY  07/24/2011   Procedure: COLONOSCOPY;  Surgeon: Daneil Dolin, MD;  [facility];  Service: Endoscopy;  Laterality: N/A;  8:15  . CORONARY STENT PLACEMENT    . ENDARTERECTOMY FEMORAL Left 11/11/2013   Procedure: Left Femoral Endarterectomy with Patch Angioplasty ;  Surgeon: Angelia Mould, MD;  Location: [facility];  Service: Vascular;  Laterality: Left;  . ESOPHAGEAL DILATION N/A 08/29/2019   Procedure: ESOPHAGEAL DILATION;  Surgeon: Rogene Houston, MD;  [facility];  Service: Endoscopy;  Laterality: N/A;  . ESOPHAGOGASTRODUODENOSCOPY N/A 10/09/2014   Procedure: ESOPHAGOGASTRODUODENOSCOPY (EGD);  Surgeon: Rogene Houston, MD;  [facility];  Service: Endoscopy;  Laterality: N/A;  230 - moved to 9:55 - Ann notified pt to arrive at 8:55  . ESOPHAGOGASTRODUODENOSCOPY (EGD) WITH PROPOFOL N/A 07/21/2019   Procedure: ESOPHAGOGASTRODUODENOSCOPY (EGD) WITH PROPOFOL;  Surgeon: Rogene Houston, MD;  [facility];  Service: Endoscopy;  Laterality: N/A;  11:55  . ESOPHAGOGASTRODUODENOSCOPY (EGD) WITH PROPOFOL N/A 08/29/2019   Procedure: ESOPHAGOGASTRODUODENOSCOPY (EGD) WITH PROPOFOL;  Surgeon: Rogene Houston, MD;  [facility];  Service: Endoscopy;  Laterality: N/A;  730  . ILIAC ARTERY STENT Left 11/03/2013   Dr. Judithann Sauger  . IR FLUORO GUIDED NEEDLE PLC ASPIRATION/INJECTION LOC  08/03/2018  . LOWER EXTREMITY ANGIOGRAM N/A 11/03/2013   Procedure: LOWER EXTREMITY ANGIOGRAM;  Surgeon: Lorretta Harp, MD;  Location: [facility] CATH LAB;  Service: Cardiovascular;  Laterality: N/A;  . LUMBAR MICRODISCECTOMY Left 02/2008   L4-5  . NM MYOCAR PERF WALL MOTION  07/2009   persantine - normal perfusion  . SURGERY SCROTAL / TESTICULAR Left    cyst excision  . THORACIC ESOPHAGUS REPLACEMENT      Family History: Family History  Problem Relation Age of Onset  . Heart attack Father 44       Cardiac arrest   . Sudden death Father   . Stroke Brother 44  . Stroke Mother   . Colon cancer Neg Hx     Social History: Social History   Tobacco Use  Smoking Status Current Every Day Smoker  . Packs/day: 0.50  . Years: 40.00  . Pack years: 20.00  . Types: Cigarettes  . Last attempt to quit: 11/02/2013  . Years since quitting: 6.8  Smokeless Tobacco Never Used   Social History   Substance and Sexual Activity  Alcohol Use Yes  . Alcohol/week: 21.0 standard drinks  . Types: 21 Cans of beer per week   Comment: 1-2 beers per day.   Social History   Substance and Sexual Activity  Drug Use No    Allergies: Allergies  Allergen Reactions  . Drug [Tape]     PLEASE USE COBAN ,,,, ADHESIVE TAPE TEARS SKIN   . Glycopyrrolate Other (See Comments)    Intolerance  . Warfarin And Related Other (See Comments)    Excessive bleeding    Medications: Current Outpatient Medications  Medication Sig Dispense Refill  . albuterol (VENTOLIN HFA) 108 (90 Base) MCG/ACT inhaler Inhale 2 puffs into the lungs every 4 (four) hours as needed for  wheezing or shortness of breath.     . ALPRAZolam (XANAX) 1 MG tablet Take 1 mg by mouth at bedtime as needed for anxiety.    Marland Kitchen aspirin EC 81 MG tablet Take 1 tablet (81 mg total) by mouth daily.    . Cholecalciferol (VITAMIN D3) 50 MCG (2000 UT) CHEW Chew 2,000 Units by mouth daily.     . diphenoxylate-atropine (LOMOTIL) 2.5-0.025 MG tablet Take 1 tablet by mouth every 6 (six) hours as needed for diarrhea or loose stools.     Marland Kitchen levothyroxine (SYNTHROID) 50 MCG tablet Take 50 mcg by mouth daily before breakfast.     . nystatin (MYCOSTATIN) 100000 UNIT/ML suspension Take 5 mLs (500,000 Units total) by mouth 4 (four) times daily. 240 mL 2  . Oxycodone HCl 20 MG TABS Take 1 tablet (20 mg total) by mouth every 4 (four) hours as needed (pain). 30 tablet 0  . propranolol (INDERAL) 20 MG tablet Take 20 mg by mouth 2 (two) times daily.     . Tiotropium Bromide-Olodaterol  2.5-2.5 MCG/ACT AERS Inhale 2 puffs into the lungs daily.    . folic acid (FOLVITE) 1 MG tablet Take 1 tablet (1 mg total) by mouth daily. (Patient not taking: Reported on 09/01/2020) 30 tablet 5  . losartan (COZAAR) 50 MG tablet Take 0.5 tablets (25 mg total) by mouth daily. (Patient not taking: Reported on 09/01/2020) 30 tablet 5   No current facility-administered medications for this visit.    Review of Systems: GENERAL: negative for malaise, night sweats HEENT: No changes in hearing or vision, no nose bleeds or other nasal problems. NECK: Negative for lumps, goiter, pain and significant neck swelling RESPIRATORY: Negative for cough, wheezing CARDIOVASCULAR: Negative for chest pain, leg swelling, palpitations, orthopnea GI: SEE HPI MUSCULOSKELETAL: Negative for joint pain or swelling, back pain, and muscle pain. SKIN: Negative for lesions, rash PSYCH: Negative for sleep disturbance, mood disorder and recent psychosocial stressors. HEMATOLOGY Negative for prolonged bleeding, bruising easily, and swollen nodes. ENDOCRINE: Negative for cold or heat intolerance, polyuria, polydipsia and goiter. NEURO: negative for tremor, gait imbalance, syncope and seizures. The remainder of the review of systems is noncontributory.   Physical Exam: BP 98/62 (BP Location: Left Arm, Patient Position: Sitting, Cuff Size: Small)   Pulse 84   Temp 97.9 F (36.6 C) (Oral)   Ht 5\' 7"  (1.702 m)   Wt 120 lb (54.4 kg)   BMI 18.79 kg/m  GENERAL: The patient is AO x3, in no acute distress. Underweight. HEENT: Head is normocephalic and atraumatic. EOMI are intact. Mouth is well hydrated and without lesions. NECK: Supple. No masses LUNGS: Clear to auscultation. No presence of rhonchi/wheezing/rales. Adequate chest expansion HEART: RRR, normal s1 and s2. ABDOMEN: Soft, nontender, no guarding, no peritoneal signs, and nondistended. BS +. No masses. EXTREMITIES: Without any cyanosis, clubbing, rash, lesions or  edema. NEUROLOGIC: AOx3, no focal motor deficit. SKIN: no jaundice, no rashes  Imaging/Labs: as above  I personally reviewed and interpreted the available labs, imaging and endoscopic files.  Impression and Plan: Jaymar T [place] is a 60 y.o. male with PMH esophageal adenocarcinoma s/p esophagectomy on 5397 at [facility] complicated by postoperative leak and recurrent strictures, COPD, HLD , HTN, chronic smoking and alcohol use, anxiety and PAD, who presents for follow up of dysphagia.  Has a history of recurrent strictures at the esophagogastric anastomosis which has required repeat dilations.  I consider that he is presenting recurrent episode of stricture, for which we will need  to proceed with a repeat EGD with esophageal dilation and possible injection of triamcinolone.  I advised the patient to stay on liquids 3 days before his procedure is done to avoid having any food lodged in this area.  The patient understood and agreed.  On the other hand, he has extensive bruising in his upper extremities, which along with history of chronic alcohol intake, labs showing inversion of AST to ALT ratio and low albumin, raising concern for liver cirrhosis.  Will obtain MELD labs today.  I strongly advised patient that both for his liver was also due to his recurrent strictures he should abstain from alcohol and cigarette use.  -Schedule EGD -Check MELD labs - Alcohol and tobacco cessation  All questions were answered.      Harvel Quale, MD Gastroenterology and Hepatology [facility]

## 2020-09-01 NOTE — Patient Instructions (Signed)
Schedule EGD Perform blood workup

## 2020-09-02 ENCOUNTER — Other Ambulatory Visit (INDEPENDENT_AMBULATORY_CARE_PROVIDER_SITE_OTHER): Payer: Self-pay

## 2020-09-02 ENCOUNTER — Encounter (INDEPENDENT_AMBULATORY_CARE_PROVIDER_SITE_OTHER): Payer: Self-pay

## 2020-09-08 ENCOUNTER — Encounter (HOSPITAL_COMMUNITY): Admission: RE | Admit: 2020-09-08 | Payer: Medicare Other | Source: Ambulatory Visit

## 2020-09-08 ENCOUNTER — Other Ambulatory Visit (HOSPITAL_COMMUNITY): Payer: Medicare Other

## 2020-09-10 ENCOUNTER — Ambulatory Visit (HOSPITAL_COMMUNITY): Admission: RE | Admit: 2020-09-10 | Payer: Medicare Other | Source: Home / Self Care | Admitting: Gastroenterology

## 2020-09-10 ENCOUNTER — Encounter (HOSPITAL_COMMUNITY): Admission: RE | Payer: Self-pay | Source: Home / Self Care

## 2020-09-10 SURGERY — ESOPHAGOGASTRODUODENOSCOPY (EGD) WITH PROPOFOL
Anesthesia: Monitor Anesthesia Care

## 2020-10-05 DIAGNOSIS — E063 Autoimmune thyroiditis: Secondary | ICD-10-CM | POA: Diagnosis not present

## 2020-10-05 DIAGNOSIS — M1991 Primary osteoarthritis, unspecified site: Secondary | ICD-10-CM | POA: Diagnosis not present

## 2020-10-05 DIAGNOSIS — G894 Chronic pain syndrome: Secondary | ICD-10-CM | POA: Diagnosis not present

## 2020-11-03 DIAGNOSIS — Z681 Body mass index (BMI) 19 or less, adult: Secondary | ICD-10-CM | POA: Diagnosis not present

## 2020-11-03 DIAGNOSIS — G894 Chronic pain syndrome: Secondary | ICD-10-CM | POA: Diagnosis not present

## 2020-11-03 DIAGNOSIS — L729 Follicular cyst of the skin and subcutaneous tissue, unspecified: Secondary | ICD-10-CM | POA: Diagnosis not present

## 2020-11-03 DIAGNOSIS — E441 Mild protein-calorie malnutrition: Secondary | ICD-10-CM | POA: Diagnosis not present

## 2020-12-03 DIAGNOSIS — M4626 Osteomyelitis of vertebra, lumbar region: Secondary | ICD-10-CM | POA: Diagnosis not present

## 2020-12-03 DIAGNOSIS — C159 Malignant neoplasm of esophagus, unspecified: Secondary | ICD-10-CM | POA: Diagnosis not present

## 2020-12-03 DIAGNOSIS — E063 Autoimmune thyroiditis: Secondary | ICD-10-CM | POA: Diagnosis not present

## 2020-12-03 DIAGNOSIS — I4891 Unspecified atrial fibrillation: Secondary | ICD-10-CM | POA: Diagnosis not present

## 2020-12-03 DIAGNOSIS — Z681 Body mass index (BMI) 19 or less, adult: Secondary | ICD-10-CM | POA: Diagnosis not present

## 2020-12-03 DIAGNOSIS — G894 Chronic pain syndrome: Secondary | ICD-10-CM | POA: Diagnosis not present

## 2021-01-03 DIAGNOSIS — H16223 Keratoconjunctivitis sicca, not specified as Sjogren's, bilateral: Secondary | ICD-10-CM | POA: Diagnosis not present

## 2021-01-03 DIAGNOSIS — H2513 Age-related nuclear cataract, bilateral: Secondary | ICD-10-CM | POA: Diagnosis not present

## 2021-01-03 DIAGNOSIS — H5231 Anisometropia: Secondary | ICD-10-CM | POA: Diagnosis not present

## 2021-01-03 DIAGNOSIS — D3132 Benign neoplasm of left choroid: Secondary | ICD-10-CM | POA: Diagnosis not present

## 2021-01-03 DIAGNOSIS — H5203 Hypermetropia, bilateral: Secondary | ICD-10-CM | POA: Diagnosis not present

## 2021-01-03 DIAGNOSIS — H524 Presbyopia: Secondary | ICD-10-CM | POA: Diagnosis not present

## 2021-01-04 DIAGNOSIS — Z681 Body mass index (BMI) 19 or less, adult: Secondary | ICD-10-CM | POA: Diagnosis not present

## 2021-01-04 DIAGNOSIS — K047 Periapical abscess without sinus: Secondary | ICD-10-CM | POA: Diagnosis not present

## 2021-01-04 DIAGNOSIS — C159 Malignant neoplasm of esophagus, unspecified: Secondary | ICD-10-CM | POA: Diagnosis not present

## 2021-01-04 DIAGNOSIS — G894 Chronic pain syndrome: Secondary | ICD-10-CM | POA: Diagnosis not present

## 2021-02-03 DIAGNOSIS — G894 Chronic pain syndrome: Secondary | ICD-10-CM | POA: Diagnosis not present

## 2021-02-03 DIAGNOSIS — M1991 Primary osteoarthritis, unspecified site: Secondary | ICD-10-CM | POA: Diagnosis not present

## 2021-02-03 DIAGNOSIS — Z681 Body mass index (BMI) 19 or less, adult: Secondary | ICD-10-CM | POA: Diagnosis not present

## 2021-02-03 DIAGNOSIS — Z0000 Encounter for general adult medical examination without abnormal findings: Secondary | ICD-10-CM | POA: Diagnosis not present

## 2021-02-03 DIAGNOSIS — E063 Autoimmune thyroiditis: Secondary | ICD-10-CM | POA: Diagnosis not present

## 2021-03-04 DIAGNOSIS — G894 Chronic pain syndrome: Secondary | ICD-10-CM | POA: Diagnosis not present

## 2021-03-04 DIAGNOSIS — K222 Esophageal obstruction: Secondary | ICD-10-CM | POA: Diagnosis not present

## 2021-03-05 ENCOUNTER — Encounter (HOSPITAL_COMMUNITY): Payer: Self-pay

## 2021-03-05 ENCOUNTER — Emergency Department (HOSPITAL_COMMUNITY): Payer: Medicare Other

## 2021-03-05 ENCOUNTER — Other Ambulatory Visit: Payer: Self-pay

## 2021-03-05 ENCOUNTER — Observation Stay (HOSPITAL_COMMUNITY)
Admission: EM | Admit: 2021-03-05 | Discharge: 2021-03-06 | Disposition: A | Discharge status: Home / Self Care | Payer: Medicare Other | Attending: Internal Medicine | Admitting: Internal Medicine

## 2021-03-05 DIAGNOSIS — G8929 Other chronic pain: Secondary | ICD-10-CM

## 2021-03-05 DIAGNOSIS — R7401 Elevation of levels of liver transaminase levels: Secondary | ICD-10-CM

## 2021-03-05 DIAGNOSIS — E876 Hypokalemia: Secondary | ICD-10-CM | POA: Diagnosis not present

## 2021-03-05 DIAGNOSIS — E860 Dehydration: Secondary | ICD-10-CM

## 2021-03-05 DIAGNOSIS — I2510 Atherosclerotic heart disease of native coronary artery without angina pectoris: Secondary | ICD-10-CM | POA: Diagnosis not present

## 2021-03-05 DIAGNOSIS — K222 Esophageal obstruction: Secondary | ICD-10-CM | POA: Diagnosis not present

## 2021-03-05 DIAGNOSIS — Z20822 Contact with and (suspected) exposure to covid-19: Secondary | ICD-10-CM | POA: Insufficient documentation

## 2021-03-05 DIAGNOSIS — I4891 Unspecified atrial fibrillation: Secondary | ICD-10-CM | POA: Diagnosis not present

## 2021-03-05 DIAGNOSIS — E871 Hypo-osmolality and hyponatremia: Secondary | ICD-10-CM | POA: Insufficient documentation

## 2021-03-05 DIAGNOSIS — R1310 Dysphagia, unspecified: Secondary | ICD-10-CM | POA: Diagnosis not present

## 2021-03-05 DIAGNOSIS — E039 Hypothyroidism, unspecified: Secondary | ICD-10-CM

## 2021-03-05 DIAGNOSIS — R718 Other abnormality of red blood cells: Secondary | ICD-10-CM | POA: Diagnosis not present

## 2021-03-05 DIAGNOSIS — Z8501 Personal history of malignant neoplasm of esophagus: Secondary | ICD-10-CM | POA: Diagnosis not present

## 2021-03-05 DIAGNOSIS — J449 Chronic obstructive pulmonary disease, unspecified: Secondary | ICD-10-CM | POA: Diagnosis not present

## 2021-03-05 DIAGNOSIS — R130 Aphagia: Secondary | ICD-10-CM

## 2021-03-05 DIAGNOSIS — F1010 Alcohol abuse, uncomplicated: Secondary | ICD-10-CM | POA: Diagnosis not present

## 2021-03-05 DIAGNOSIS — Z79899 Other long term (current) drug therapy: Secondary | ICD-10-CM | POA: Diagnosis not present

## 2021-03-05 DIAGNOSIS — I10 Essential (primary) hypertension: Secondary | ICD-10-CM | POA: Diagnosis not present

## 2021-03-05 DIAGNOSIS — R739 Hyperglycemia, unspecified: Secondary | ICD-10-CM | POA: Diagnosis not present

## 2021-03-05 DIAGNOSIS — D696 Thrombocytopenia, unspecified: Secondary | ICD-10-CM

## 2021-03-05 DIAGNOSIS — Z7982 Long term (current) use of aspirin: Secondary | ICD-10-CM | POA: Diagnosis not present

## 2021-03-05 DIAGNOSIS — J90 Pleural effusion, not elsewhere classified: Secondary | ICD-10-CM | POA: Diagnosis not present

## 2021-03-05 DIAGNOSIS — F17210 Nicotine dependence, cigarettes, uncomplicated: Secondary | ICD-10-CM | POA: Insufficient documentation

## 2021-03-05 LAB — COMPREHENSIVE METABOLIC PANEL
ALT: 18 U/L (ref 0–44)
AST: 53 U/L — ABNORMAL HIGH (ref 15–41)
Albumin: 3.9 g/dL (ref 3.5–5.0)
Alkaline Phosphatase: 110 U/L (ref 38–126)
Anion gap: 12 (ref 5–15)
BUN: 8 mg/dL (ref 6–20)
CO2: 26 mmol/L (ref 22–32)
Calcium: 9.4 mg/dL (ref 8.9–10.3)
Chloride: 95 mmol/L — ABNORMAL LOW (ref 98–111)
Creatinine, Ser: 0.84 mg/dL (ref 0.61–1.24)
GFR, Estimated: >60 mL/min (ref >60)
Glucose, Bld: 139 mg/dL — ABNORMAL HIGH (ref 70–99)
Potassium: 3.4 mmol/L — ABNORMAL LOW (ref 3.5–5.1)
Sodium: 133 mmol/L — ABNORMAL LOW (ref 135–145)
Total Bilirubin: 1.5 mg/dL — ABNORMAL HIGH (ref 0.3–1.2)
Total Protein: 8.2 g/dL — ABNORMAL HIGH (ref 6.5–8.1)

## 2021-03-05 LAB — CBC WITH DIFFERENTIAL/PLATELET
Abs Immature Granulocytes: 0.02 10*3/uL (ref 0.00–0.07)
Basophils Absolute: 0 10*3/uL (ref 0.0–0.1)
Basophils Relative: 0 %
Eosinophils Absolute: 0 10*3/uL (ref 0.0–0.5)
Eosinophils Relative: 0 %
HCT: 46.4 % (ref 39.0–52.0)
Hemoglobin: 15.9 g/dL (ref 13.0–17.0)
Immature Granulocytes: 0 %
Lymphocytes Relative: 17 %
Lymphs Abs: 1.1 10*3/uL (ref 0.7–4.0)
MCH: 34.5 pg — ABNORMAL HIGH (ref 26.0–34.0)
MCHC: 34.3 g/dL (ref 30.0–36.0)
MCV: 100.7 fL — ABNORMAL HIGH (ref 80.0–100.0)
Monocytes Absolute: 0.4 10*3/uL (ref 0.1–1.0)
Monocytes Relative: 7 %
Neutro Abs: 4.9 10*3/uL (ref 1.7–7.7)
Neutrophils Relative %: 76 %
Platelets: 149 10*3/uL — ABNORMAL LOW (ref 150–400)
RBC: 4.61 MIL/uL (ref 4.22–5.81)
RDW: 12.7 % (ref 11.5–15.5)
WBC: 6.5 10*3/uL (ref 4.0–10.5)
nRBC: 0 % (ref 0.0–0.2)

## 2021-03-05 LAB — RESP PANEL BY RT-PCR (FLU A&B, COVID) ARPGX2
Influenza A by PCR: NEGATIVE
Influenza B by PCR: NEGATIVE
SARS Coronavirus 2 by RT PCR: NEGATIVE

## 2021-03-05 MED ORDER — SODIUM CHLORIDE 0.9 % IV BOLUS
1000.0000 mL | Freq: Once | INTRAVENOUS | Status: AC
  Administered 2021-03-05: 1000 mL via INTRAVENOUS

## 2021-03-05 MED ORDER — ONDANSETRON HCL 4 MG/2ML IJ SOLN
4.0000 mg | Freq: Once | INTRAMUSCULAR | Status: AC
  Administered 2021-03-05: 4 mg via INTRAVENOUS
  Filled 2021-03-05: qty 2

## 2021-03-05 MED ORDER — MORPHINE SULFATE (PF) 4 MG/ML IV SOLN
4.0000 mg | Freq: Once | INTRAVENOUS | Status: AC
Start: 2021-03-05 — End: 2021-03-05
  Administered 2021-03-05: 4 mg via INTRAVENOUS
  Filled 2021-03-05: qty 1

## 2021-03-05 MED ORDER — HYDROMORPHONE HCL 1 MG/ML IJ SOLN
1.0000 mg | Freq: Once | INTRAMUSCULAR | Status: AC
  Administered 2021-03-05: 1 mg via INTRAVENOUS
  Filled 2021-03-05: qty 1

## 2021-03-05 NOTE — ED Provider Notes (Signed)
[facility] EMERGENCY DEPARTMENT Provider Note   CSN: [id number] Arrival date & time: 03/05/21  2144     History Chief Complaint  Patient presents with   unable to swallow    "Wants IV fluids"    Jimmy Tucker is a 60 y.o. male.  Pt presents to the ED today with the inability to swallow.  Pt has a hx of esophageal adenocarcinoma s/p esophagectomy in 2016 at [facility].  He has had recurrent strictures since surgery.  His also continues to smoke and drink alcohol.  Pt was last dilated by Dr. Laural Golden on 08/29/19 which showed:  A partial esophagectomy anastomosis was found at the esophageal anastomosis. This was characterized by congestion, erythema and friable mucosa. Scope could not be passed across it. A TTS dilator was passed through the scope. Dilation with a 12-13.5-15 mm balloon dilator was performed to 12 mm, 13.5 mm and 15 mm. The dilation site was examined and showed moderate mucosal disruption, mild improvement in luminal narrowing and no perforation.  He did follow with Dr. Jenetta Downer in March of this year and an EGD was planned, but it was cancelled by patient because his wife had just been diagnosed with lung cancer.  Pt did not call to reschedule.   Pt has not been able to swallow anything since Wednesday evening (9/7).  Pt did see his pcp yesterday and was told to come to the ED if he is still unable to swallow.  He has not been able to take any of his meds since the 7th.      Past Medical History:  Diagnosis Date   Abnormal nuclear stress test    Anxiety    Cancer (HCC)    Carotid artery disease (HCC)    Chronic back pain    Chronic lower back pain    "L4-5"   COPD (chronic obstructive pulmonary disease) (HCC)    Esophageal cancer (HCC)    Heavy cigarette smoker (20-39 per day)    Unmotivated   Hyperlipidemia LDL goal <70    Intolerant to statins   Hypertension    Lumbar disc disease    PAD (peripheral artery disease) ([place]) 10/30/2012   a) 2011 LEA Dopplers: Left  ATA occlusion; b) 10/2013: LEA Dopplers - L Common Iliac & CFA occlusion - w/ short reconstitution at the branch point of the external and internal iliac, the external iliac is occluded through the common femoral and proximal SFA; reconstitutes in the proximal SFA. Two-vessel runoff beyond.; c) s/p LCIA Stent & CFA EA w/ patch angioplasty --> f/u doppler 12/01/2013 patent   Pollen allergies    Presence of stent in artery     Patient Active Problem List   Diagnosis Date Noted   Dysphagia 09/01/2020   Chronic alcohol use 09/01/2020   Pressure injury of skin 08/03/2018   History of esophageal cancer 08/03/2018   Esophageal stenosis 08/03/2018   Osteomyelitis of vertebra of lumbar region ([place]) 08/02/2018   Unsteady gait 08/02/2018   Osteomyelitis of lumbar spine ([place]) 08/02/2018   Coronary artery disease 01/25/2018   Unspecified atrial fibrillation ([place]) 12/24/2017   Hypothyroidism 12/24/2017   Alcohol abuse 12/24/2017   Hyponatremia 12/23/2017   Abnormal nuclear stress test 02/09/2015   PAD (peripheral artery disease) ([place]) 11/11/2013   Critical lower limb ischemia - poorly healing ulcer on left foot with occluded iliac, femoral artery on left 10/30/2013   Hypertension    Heavy cigarette smoker (20-39 per day)    Hyperlipidemia LDL goal <  87    Screening for colon cancer 07/07/2011    Past Surgical History:  Procedure Laterality Date   BACK SURGERY  02/2008   CARDIAC CATHETERIZATION N/A 02/15/2015   Procedure: Left Heart Cath and Coronary Angiography;  Surgeon: Lorretta Harp, MD;  Location: [place] [facility];  Service: Cardiovascular;  Laterality: N/A;   COLONOSCOPY  07/24/2011   Procedure: COLONOSCOPY;  Surgeon: Daneil Dolin, MD;  [facility];  Service: Endoscopy;  Laterality: N/A;  8:15   CORONARY STENT PLACEMENT     ENDARTERECTOMY FEMORAL Left 11/11/2013   Procedure: Left Femoral Endarterectomy with Patch Angioplasty ;  Surgeon: Angelia Mould, MD;  Location: [place];  Service: Vascular;  Laterality: Left;   ESOPHAGEAL DILATION N/A 08/29/2019   Procedure: ESOPHAGEAL DILATION;  Surgeon: Rogene Houston, MD;  [facility];  Service: Endoscopy;  Laterality: N/A;   ESOPHAGOGASTRODUODENOSCOPY N/A 10/09/2014   Procedure: ESOPHAGOGASTRODUODENOSCOPY (EGD);  Surgeon: Rogene Houston, MD;  [facility];  Service: Endoscopy;  Laterality: N/A;  230 - moved to 9:55 - Ann notified pt to arrive at 8:55   ESOPHAGOGASTRODUODENOSCOPY (EGD) WITH PROPOFOL N/A 07/21/2019   Procedure: ESOPHAGOGASTRODUODENOSCOPY (EGD) WITH PROPOFOL;  Surgeon: Rogene Houston, MD;  [facility];  Service: Endoscopy;  Laterality: N/A;  11:55   ESOPHAGOGASTRODUODENOSCOPY (EGD) WITH PROPOFOL N/A 08/29/2019   Procedure: ESOPHAGOGASTRODUODENOSCOPY (EGD) WITH PROPOFOL;  Surgeon: Rogene Houston, MD;  [facility];  Service: Endoscopy;  Laterality: N/A;  730   ILIAC ARTERY STENT Left 11/03/2013   Dr. Judithann Sauger   IR FLUORO GUIDED NEEDLE Nelson ASPIRATION/INJECTION LOC  08/03/2018   LOWER EXTREMITY ANGIOGRAM N/A 11/03/2013   Procedure: LOWER EXTREMITY ANGIOGRAM;  Surgeon: Lorretta Harp, MD;  Location: [facility] CATH LAB;  Service: Cardiovascular;  Laterality: N/A;   LUMBAR MICRODISCECTOMY Left 02/2008   L4-5   NM MYOCAR PERF WALL MOTION  07/2009   persantine - normal perfusion   SURGERY SCROTAL / TESTICULAR Left    cyst excision   THORACIC ESOPHAGUS REPLACEMENT         Family History  Problem Relation Age of Onset   Heart attack Father 7       Cardiac arrest   Sudden death Father    Stroke Brother 30   Stroke Mother    Colon cancer Neg Hx     Social History   Tobacco Use   Smoking status: Every Day    Packs/day: 0.50    Years: 40.00    Pack years: 20.00    Types: Cigarettes    Last attempt to quit: 11/02/2013    Years since quitting: 7.3   Smokeless tobacco: Never  Vaping Use   Vaping Use: Former  Substance Use Topics   Alcohol use: Yes    Alcohol/week:  21.0 standard drinks    Types: 21 Cans of beer per week    Comment: 1-2 beers per day.   Drug use: No    Home Medications Prior to Admission medications   Medication Sig Start Date End Date Taking? Authorizing Provider  albuterol (VENTOLIN HFA) 108 (90 Base) MCG/ACT inhaler Inhale 2 puffs into the lungs every 4 (four) hours as needed for wheezing or shortness of breath.     [provider]  ALPRAZolam Duanne Moron) 1 MG tablet Take 1 mg by mouth at bedtime as needed for anxiety. 05/22/11   [provider]  aspirin EC 81 MG tablet Take 1 tablet (81  mg total) by mouth daily. 09/01/19   Rogene Houston, MD  Cholecalciferol (VITAMIN D3) 50 MCG (2000 UT) CHEW Chew 2,000 Units by mouth daily.     [provider]  diphenoxylate-atropine (LOMOTIL) 2.5-0.025 MG tablet Take 1 tablet by mouth every 6 (six) hours as needed for diarrhea or loose stools.     [provider]  folic acid (FOLVITE) 1 MG tablet Take 1 tablet (1 mg total) by mouth daily. Patient not taking: Reported on 09/01/2020 05/01/14   Leonie Man, MD  levothyroxine (SYNTHROID) 50 MCG tablet Take 50 mcg by mouth daily before breakfast.     [provider]  losartan (COZAAR) 50 MG tablet Take 0.5 tablets (25 mg total) by mouth daily. Patient not taking: Reported on 09/01/2020 11/04/13   Brett Canales, PA-C  nystatin (MYCOSTATIN) 100000 UNIT/ML suspension Take 5 mLs (500,000 Units total) by mouth 4 (four) times daily. 08/29/19   Rehman, Mechele Dawley, MD  Oxycodone HCl 20 MG TABS Take 1 tablet (20 mg total) by mouth every 4 (four) hours as needed (pain). 11/11/13   Rhyne, Hulen Shouts, PA-C  propranolol (INDERAL) 20 MG tablet Take 20 mg by mouth 2 (two) times daily.     [provider]  Tiotropium Bromide-Olodaterol 2.5-2.5 MCG/ACT AERS Inhale 2 puffs into the lungs daily.    [provider]    Allergies    Drug [tape], Glycopyrrolate, and Warfarin and related  Review of Systems   Review of  Systems  Gastrointestinal:        Difficulty swallowing  All other systems reviewed and are negative.  Physical Exam Updated Vital Signs BP (!) 136/109   Pulse (!) 116   Temp 97.8 F (36.6 C) (Oral)   Resp 18   Ht '5\' 6"'$  (1.676 m)   Wt 54.4 kg   SpO2 100%   BMI 19.37 kg/m   Physical Exam Vitals and nursing note reviewed.  Constitutional:      Appearance: He is cachectic.  HENT:     Nose: Nose normal.     Mouth/Throat:     Mouth: Mucous membranes are dry.  Eyes:     Extraocular Movements: Extraocular movements intact.  Cardiovascular:     Rate and Rhythm: Regular rhythm. Tachycardia present.     Pulses: Normal pulses.     Heart sounds: Normal heart sounds.  Pulmonary:     Effort: Pulmonary effort is normal.     Breath sounds: Normal breath sounds.  Abdominal:     General: Abdomen is flat. Bowel sounds are normal.     Palpations: Abdomen is soft.  Musculoskeletal:        General: Normal range of motion.     Cervical back: Normal range of motion and neck supple.  Skin:    General: Skin is warm.     Capillary Refill: Capillary refill takes less than 2 seconds.  Neurological:     General: No focal deficit present.     Mental Status: He is oriented to person, place, and time.  Psychiatric:        Mood and Affect: Mood normal.        Behavior: Behavior normal.    ED Results / Procedures / Treatments   Labs (all labs ordered are listed, but only abnormal results are displayed) Labs Reviewed  COMPREHENSIVE METABOLIC PANEL - Abnormal; Notable for the following components:      Result Value   Sodium 133 (*)    Potassium  3.4 (*)    Chloride 95 (*)    Glucose, Bld 139 (*)    Total Protein 8.2 (*)    AST 53 (*)    Total Bilirubin 1.5 (*)    All other components within normal limits  CBC WITH DIFFERENTIAL/PLATELET - Abnormal; Notable for the following components:   MCV 100.7 (*)    MCH 34.5 (*)    Platelets 149 (*)    All other components within normal limits   RESP PANEL BY RT-PCR (FLU A&B, COVID) [id number]    EKG None  Radiology DG Chest Portable 1 View  Result Date: 03/05/2021 CLINICAL DATA:  Hypertension EXAM: PORTABLE CHEST 1 VIEW COMPARISON:  02/12/2015 FINDINGS: Small bilateral pleural effusions. No focal consolidation. Normal cardiac size. Aortic atherosclerosis. No pneumothorax. Probable air distension of esophagus with fluid level. IMPRESSION: 1. Small bilateral effusions. 2. Suspected air distension of esophagus, with possible fluid level Electronically Signed   By: Donavan Foil M.D.   On: 03/05/2021 22:41    Procedures Procedures   Medications Ordered in ED Medications  HYDROmorphone (DILAUDID) injection 1 mg (has no administration in time range)  sodium chloride 0.9 % bolus 1,000 mL (1,000 mLs Intravenous New Bag/Given 03/05/21 2229)  morphine 4 MG/ML injection 4 mg (4 mg Intravenous Given 03/05/21 2229)  ondansetron (ZOFRAN) injection 4 mg (4 mg Intravenous Given 03/05/21 2229)    ED Course  I have reviewed the triage vital signs and the nursing notes.  Pertinent labs & imaging results that were available during my care of the patient were reviewed by me and considered in my medical decision making (see chart for details).    MDM Rules/Calculators/A&P                           Pt given IVFs.  He is still unable to swallow.  Pt d/w Dr. Josephine Cables (triad) for admission.  I secure messaged Dr. Laural Golden and Dr. Jenetta Downer about the need for dilation tomorrow.    Final Clinical Impression(s) / ED Diagnoses Final diagnoses:  Esophageal stricture  Other chronic pain  Dehydration  Inability to swallow    Rx / DC Orders ED Discharge Orders     None        Isla Pence, MD 03/05/21 2308

## 2021-03-05 NOTE — ED Triage Notes (Addendum)
Pt brought in by spouse from home, pt saw Dr Gerarda Fraction on Friday and was instructed to come to ED if he still wasn't able to swallow after couple of days, pt has hx of esophageal cancer with partial surgical removal of esophagus and stomach- pt says this happens often due to scar tissue and usually resolves on its own, but hasn't this time- pt says he hasn't had anything to eat or drink since Wednesday night b/c he is unable to swallow- everything comes back up and has to spit it out. Pt has urinated today. Pt has not had any of his rx medications since Wed morning.

## 2021-03-05 NOTE — H&P (Signed)
History and Physical  Jimmy Tucker [id number] DOB: [birth year] DOA: 03/05/2021  Referring physician:Haviland, Almyra Free, MD PCP: [name], MD  Patient coming from: Home  Chief Complaint: Inability to swallow  HPI: Jimmy Tucker is a 60 y.o. male with medical history significant for hypothyroidism, COPD, hypertension, esophageal adenocarcinoma status post esophagectomy (2016) at [facility], tobacco and alcohol use who presents to the emergency department due to 3-day onset (8/7) of inability to swallow any solid food or liquid, so he went to his PCP yesterday (9/9) and was told to go to the ED if he continues to have difficulty in being able to swallow, patient has not taken any of his meds since Wednesday (9/7).  Patient states that he has had recurrent strictures since surgery at [facility], he had an esophageal dilatation on 08/29/2019 by Dr. Laural Golden during which esophageal plaques which was consistent with candidiasis was noted.  A partial esophagectomy anastomosis was found, characterized by congestion, erythema and friable mucosa.  Hypopharynx and stomach were normal.  Patient denies chest pain, shortness of breath, headache, fever, chills.  He was scheduled to have a repeat upper endoscopy on 09/10/2020, but patient canceled the appointment due to wife being diagnosed with lung cancer and he has not rescheduled since then.  ED Course:  In the emergency department, initial BP was elevated at 202/111, but other vital signs were within normal range.  Work-up in the ED shows thrombocytopenia and elevated MCV, BMP shows hypokalemia, hyponatremia, hyperglycemia, elevated AST, T bili 1.5.  Influenza A, B, SARS coronavirus 2 was negative. Chest x-ray showed small bilateral effusions, suspected air distention of esophagus, with possible fluid level. Dilaudid and morphine were given, Zofran was given and patient was provided with IV hydration.  Hospitalist was asked to admit patient for  further evaluation and management.  Review of Systems: Constitutional: Negative for chills and fever.  HENT: Negative for ear pain and sore throat.   Eyes: Negative for pain and visual disturbance.  Respiratory: Negative for cough, chest tightness and shortness of breath.   Cardiovascular: Negative for chest pain and palpitations.  Gastrointestinal: Positive for difficulty swallowing.  Negative for abdominal pain  Endocrine: Negative for polyphagia and polyuria.  Genitourinary: Negative for decreased urine volume, dysuria, enuresis Musculoskeletal: Negative for arthralgias and back pain.  Skin: Negative for color change and rash.  Allergic/Immunologic: Negative for immunocompromised state.  Neurological: Negative for tremors, syncope, speech difficulty, weakness, light-headedness and headaches.  Hematological: Does not bruise/bleed easily.  All other systems reviewed and are negative   Past Medical History:  Diagnosis Date   Abnormal nuclear stress test    Anxiety    Cancer (HCC)    Carotid artery disease (HCC)    Chronic back pain    Chronic lower back pain    "L4-5"   COPD (chronic obstructive pulmonary disease) (HCC)    Esophageal cancer (HCC)    Heavy cigarette smoker (20-39 per day)    Unmotivated   Hyperlipidemia LDL goal <70    Intolerant to statins   Hypertension    Lumbar disc disease    PAD (peripheral artery disease) ([place]) 10/30/2012   a) 2011 LEA Dopplers: Left ATA occlusion; b) 10/2013: LEA Dopplers - L Common Iliac & CFA occlusion - w/ short reconstitution at the branch point of the external and internal iliac, the external iliac is occluded through the common femoral and proximal SFA; reconstitutes in the proximal SFA. Two-vessel runoff beyond.; c) s/p LCIA Stent & CFA  EA w/ patch angioplasty --> f/u doppler 12/01/2013 patent   Pollen allergies    Presence of stent in artery    Past Surgical History:  Procedure Laterality Date   BACK SURGERY  02/2008   CARDIAC  CATHETERIZATION N/A 02/15/2015   Procedure: Left Heart Cath and Coronary Angiography;  Surgeon: Lorretta Harp, MD;  Location: [place] [facility];  Service: Cardiovascular;  Laterality: N/A;   COLONOSCOPY  07/24/2011   Procedure: COLONOSCOPY;  Surgeon: Daneil Dolin, MD;  [facility];  Service: Endoscopy;  Laterality: N/A;  8:15   CORONARY STENT PLACEMENT     ENDARTERECTOMY FEMORAL Left 11/11/2013   Procedure: Left Femoral Endarterectomy with Patch Angioplasty ;  Surgeon: Angelia Mould, MD;  Location: [facility];  Service: Vascular;  Laterality: Left;   ESOPHAGEAL DILATION N/A 08/29/2019   Procedure: ESOPHAGEAL DILATION;  Surgeon: Rogene Houston, MD;  [facility];  Service: Endoscopy;  Laterality: N/A;   ESOPHAGOGASTRODUODENOSCOPY N/A 10/09/2014   Procedure: ESOPHAGOGASTRODUODENOSCOPY (EGD);  Surgeon: Rogene Houston, MD;  [facility];  Service: Endoscopy;  Laterality: N/A;  230 - moved to 9:55 - Ann notified pt to arrive at 8:55   ESOPHAGOGASTRODUODENOSCOPY (EGD) WITH PROPOFOL N/A 07/21/2019   Procedure: ESOPHAGOGASTRODUODENOSCOPY (EGD) WITH PROPOFOL;  Surgeon: Rogene Houston, MD;  [facility];  Service: Endoscopy;  Laterality: N/A;  11:55   ESOPHAGOGASTRODUODENOSCOPY (EGD) WITH PROPOFOL N/A 08/29/2019   Procedure: ESOPHAGOGASTRODUODENOSCOPY (EGD) WITH PROPOFOL;  Surgeon: Rogene Houston, MD;  [facility];  Service: Endoscopy;  Laterality: N/A;  730   ILIAC ARTERY STENT Left 11/03/2013   Dr. Judithann Sauger   IR FLUORO GUIDED NEEDLE [place] ASPIRATION/INJECTION LOC  08/03/2018   LOWER EXTREMITY ANGIOGRAM N/A 11/03/2013   Procedure: LOWER EXTREMITY ANGIOGRAM;  Surgeon: Lorretta Harp, MD;  Location: [facility] CATH LAB;  Service: Cardiovascular;  Laterality: N/A;   LUMBAR MICRODISCECTOMY Left 02/2008   L4-5   NM MYOCAR PERF WALL MOTION  07/2009   persantine - normal perfusion   SURGERY SCROTAL / TESTICULAR Left    cyst excision   THORACIC ESOPHAGUS  REPLACEMENT      Social History:  reports that he has been smoking cigarettes. He has a 20.00 pack-year smoking history. He has never used smokeless tobacco. He reports current alcohol use of about 21.0 standard drinks per week. He reports that he does not use drugs.   Allergies  Allergen Reactions   Drug [Tape]     PLEASE USE COBAN ,,,, ADHESIVE TAPE TEARS SKIN    Glycopyrrolate Other (See Comments)    Intolerance   Warfarin And Related Other (See Comments)    Excessive bleeding    Family History  Problem Relation Age of Onset   Heart attack Father 62       Cardiac arrest   Sudden death Father    Stroke Brother 86   Stroke Mother    Colon cancer Neg Hx      Prior to Admission medications   Medication Sig Start Date End Date Taking? Authorizing Provider  albuterol (VENTOLIN HFA) 108 (90 Base) MCG/ACT inhaler Inhale 2 puffs into the lungs every 4 (four) hours as needed for wheezing or shortness of breath.     [provider]  ALPRAZolam Duanne Moron) 1 MG tablet Take 1 mg by mouth at bedtime as needed for anxiety. 05/22/11   [provider]  aspirin EC 81 MG tablet Take 1 tablet (81 mg total) by  mouth daily. 09/01/19   Rogene Houston, MD  Cholecalciferol (VITAMIN D3) 50 MCG (2000 UT) CHEW Chew 2,000 Units by mouth daily.     [provider]  diphenoxylate-atropine (LOMOTIL) 2.5-0.025 MG tablet Take 1 tablet by mouth every 6 (six) hours as needed for diarrhea or loose stools.     [provider]  folic acid (FOLVITE) 1 MG tablet Take 1 tablet (1 mg total) by mouth daily. Patient not taking: Reported on 09/01/2020 05/01/14   Leonie Man, MD  levothyroxine (SYNTHROID) 50 MCG tablet Take 50 mcg by mouth daily before breakfast.     [provider]  losartan (COZAAR) 50 MG tablet Take 0.5 tablets (25 mg total) by mouth daily. Patient not taking: Reported on 09/01/2020 11/04/13   Brett Canales, PA-C  nystatin (MYCOSTATIN) 100000 UNIT/ML suspension  Take 5 mLs (500,000 Units total) by mouth 4 (four) times daily. 08/29/19   Rehman, Mechele Dawley, MD  Oxycodone HCl 20 MG TABS Take 1 tablet (20 mg total) by mouth every 4 (four) hours as needed (pain). 11/11/13   Rhyne, Hulen Shouts, PA-C  propranolol (INDERAL) 20 MG tablet Take 20 mg by mouth 2 (two) times daily.     [provider]  Tiotropium Bromide-Olodaterol 2.5-2.5 MCG/ACT AERS Inhale 2 puffs into the lungs daily.    [provider]    Physical Exam: BP (!) 136/109   Pulse (!) 116   Temp 97.8 F (36.6 C) (Oral)   Resp 18   Ht '5\' 6"'$  (1.676 m)   Wt 54.4 kg   SpO2 100%   BMI 19.37 kg/m   General: 60 y.o. year-old cachectic male who was in no acute distress.  Alert and oriented x3. HEENT: Dry mucous membrane.  NCAT, EOMI Neck: Supple, trachea medial Cardiovascular: Tachycardia.  Regular rate and rhythm with no rubs or gallops.  No thyromegaly or JVD noted.  No lower extremity edema. 2/4 pulses in all 4 extremities. Respiratory: Clear to auscultation with no wheezes or rales. Good inspiratory effort. Abdomen: Soft, nontender nondistended with normal bowel sounds x4 quadrants. Muskuloskeletal: No cyanosis, clubbing or edema noted bilaterally Neuro: CN II-XII intact, strength 5/5 x 4, sensation, reflexes intact Skin: No ulcerative lesions noted or rashes Psychiatry: Mood is appropriate for condition and setting          Labs on Admission:  Basic Metabolic Panel: Recent Labs  Lab 03/05/21 2215  NA 133*  K 3.4*  CL 95*  CO2 26  GLUCOSE 139*  BUN 8  CREATININE 0.84  CALCIUM 9.4   Liver Function Tests: Recent Labs  Lab 03/05/21 2215  AST 53*  ALT 18  ALKPHOS 110  BILITOT 1.5*  PROT 8.2*  ALBUMIN 3.9   No results for input(s): LIPASE, AMYLASE in the last 168 hours. No results for input(s): AMMONIA in the last 168 hours. CBC: Recent Labs  Lab 03/05/21 2215  WBC 6.5  NEUTROABS 4.9  HGB 15.9  HCT 46.4  MCV 100.7*  PLT 149*   Cardiac Enzymes: No  results for input(s): CKTOTAL, CKMB, CKMBINDEX, TROPONINI in the last 168 hours.  BNP (last 3 results) No results for input(s): BNP in the last 8760 hours.  ProBNP (last 3 results) No results for input(s): PROBNP in the last 8760 hours.  CBG: No results for input(s): GLUCAP in the last 168 hours.  Radiological Exams on Admission: DG Chest Portable 1 View  Result Date: 03/05/2021 CLINICAL DATA:  Hypertension EXAM: PORTABLE CHEST 1 VIEW  COMPARISON:  02/12/2015 FINDINGS: Small bilateral pleural effusions. No focal consolidation. Normal cardiac size. Aortic atherosclerosis. No pneumothorax. Probable air distension of esophagus with fluid level. IMPRESSION: 1. Small bilateral effusions. 2. Suspected air distension of esophagus, with possible fluid level Electronically Signed   By: Donavan Foil M.D.   On: 03/05/2021 22:41    EKG: I independently viewed the EKG done and my findings are as followed: EKG was not done in the ED  Assessment/Plan Present on Admission:  Hyponatremia  Hypothyroidism  Hypertension  Heavy cigarette smoker (20-39 per day)  Alcohol abuse  Active Problems:   Hypertension   Heavy cigarette smoker (20-39 per day)   Hyponatremia   Hypothyroidism   Thrombocytopenia (HCC)   Alcohol abuse   Esophageal stricture   Hypokalemia   Hyperglycemia   Elevated MCV   Elevated AST (SGOT)  Dysphagia possibly secondary to esophageal stricture/stenosis Gastroenterology will be consulted for possible EGD Patient will be kept n.p.o. at this time  Dehydration Continue IV hydration  Hyponatremia possibly due to dehydration Na 133, continue IV hydration  Hypokalemia K+ is 3.4 K+ will be replenished Please monitor for AM K+ for further replenishmemnt  Thrombocytopenia possibly reactive Platelets 149, continue to monitor platelets with morning labs  Hyperglycemia possibly reactive CBG 139, continue to monitor blood glucose levels  Elevated MCV MCV 100.7; vitamin B12  and folate levels will be checked  Elevated AST AST 53, continue to monitor liver enzymes  Hypothyroidism Continue home meds when patient is able to tolerate oral intake  Hypertension Continue IV labetalol 5 mg every 6 hours as needed for SBP > 180  Tobacco use Patient counseled on tobacco abuse cessation  Alcohol use Alcohol level be checked Continue to monitor patient for withdrawal symptoms and start CIWA protocol  DVT prophylaxis: SCDs (consider starting patient on chemoprophylaxis if patient does not require EGD in the morning)  Code Status: Full code  Family Communication: None at bedside  Disposition Plan:  Patient is from:                        home Anticipated DC to:                   SNF or family members home Anticipated DC date:               2-3 days Anticipated DC barriers:          Patient requires inpatient management due to dysphagia which require gastroenterology consult and possible EGD   Consults called: Gastroenterology  Admission status: Observation    Bernadette Hoit MD [facility]  03/06/2021, 12:08 AM

## 2021-03-06 DIAGNOSIS — R1310 Dysphagia, unspecified: Secondary | ICD-10-CM | POA: Diagnosis not present

## 2021-03-06 DIAGNOSIS — R718 Other abnormality of red blood cells: Secondary | ICD-10-CM

## 2021-03-06 DIAGNOSIS — R7401 Elevation of levels of liver transaminase levels: Secondary | ICD-10-CM

## 2021-03-06 DIAGNOSIS — R739 Hyperglycemia, unspecified: Secondary | ICD-10-CM

## 2021-03-06 DIAGNOSIS — K222 Esophageal obstruction: Secondary | ICD-10-CM | POA: Diagnosis not present

## 2021-03-06 DIAGNOSIS — E860 Dehydration: Secondary | ICD-10-CM

## 2021-03-06 DIAGNOSIS — E876 Hypokalemia: Secondary | ICD-10-CM

## 2021-03-06 LAB — PROTIME-INR
INR: 1 (ref 0.8–1.2)
Prothrombin Time: 12.8 seconds (ref 11.4–15.2)

## 2021-03-06 LAB — COMPREHENSIVE METABOLIC PANEL WITH GFR
ALT: 13 U/L (ref 0–44)
AST: 38 U/L (ref 15–41)
Albumin: 3 g/dL — ABNORMAL LOW (ref 3.5–5.0)
Alkaline Phosphatase: 82 U/L (ref 38–126)
Anion gap: 10 (ref 5–15)
BUN: 7 mg/dL (ref 6–20)
CO2: 28 mmol/L (ref 22–32)
Calcium: 9 mg/dL (ref 8.9–10.3)
Chloride: 97 mmol/L — ABNORMAL LOW (ref 98–111)
Creatinine, Ser: 0.69 mg/dL (ref 0.61–1.24)
GFR, Estimated: >60 mL/min (ref >60)
Glucose, Bld: 103 mg/dL — ABNORMAL HIGH (ref 70–99)
Potassium: 3.6 mmol/L (ref 3.5–5.1)
Sodium: 135 mmol/L (ref 135–145)
Total Bilirubin: 1 mg/dL (ref 0.3–1.2)
Total Protein: 6.4 g/dL — ABNORMAL LOW (ref 6.5–8.1)

## 2021-03-06 LAB — CBC
HCT: 40.2 % (ref 39.0–52.0)
Hemoglobin: 13.4 g/dL (ref 13.0–17.0)
MCH: 34.1 pg — ABNORMAL HIGH (ref 26.0–34.0)
MCHC: 33.3 g/dL (ref 30.0–36.0)
MCV: 102.3 fL — ABNORMAL HIGH (ref 80.0–100.0)
Platelets: 131 10*3/uL — ABNORMAL LOW (ref 150–400)
RBC: 3.93 MIL/uL — ABNORMAL LOW (ref 4.22–5.81)
RDW: 12.5 % (ref 11.5–15.5)
WBC: 4 10*3/uL (ref 4.0–10.5)
nRBC: 0 % (ref 0.0–0.2)

## 2021-03-06 LAB — HIV ANTIBODY (ROUTINE TESTING W REFLEX): HIV Screen 4th Generation wRfx: NONREACTIVE

## 2021-03-06 LAB — FOLATE: Folate: 4.6 ng/mL — ABNORMAL LOW (ref >5.9)

## 2021-03-06 LAB — VITAMIN B12: Vitamin B-12: 503 pg/mL (ref 180–914)

## 2021-03-06 LAB — APTT: aPTT: 26 s (ref 24–36)

## 2021-03-06 MED ORDER — ONDANSETRON HCL 4 MG/2ML IJ SOLN: 4.0000 mg | Freq: Four times a day (QID) | INTRAMUSCULAR | Status: DC | PRN

## 2021-03-06 MED ORDER — LABETALOL HCL 5 MG/ML IV SOLN
5.0000 mg | Freq: Four times a day (QID) | INTRAVENOUS | Status: DC | PRN
Start: 2021-03-06 — End: 2021-03-06

## 2021-03-06 MED ORDER — PANTOPRAZOLE SODIUM 40 MG PO TBEC: 40.0000 mg | DELAYED_RELEASE_TABLET | Freq: Two times a day (BID) | ORAL | 1 refills | Status: DC

## 2021-03-06 MED ORDER — SODIUM CHLORIDE 0.9 % IV SOLN: Freq: Once | INTRAVENOUS | Status: AC

## 2021-03-06 MED ORDER — OXYCODONE HCL 5 MG PO TABS
20.0000 mg | ORAL_TABLET | ORAL | Status: DC | PRN
  Administered 2021-03-06: 20 mg via ORAL
  Filled 2021-03-06: qty 4

## 2021-03-06 MED ORDER — ONDANSETRON HCL 4 MG PO TABS: 4.0000 mg | ORAL_TABLET | Freq: Four times a day (QID) | ORAL | Status: DC | PRN

## 2021-03-06 MED ORDER — SODIUM CHLORIDE 0.9 % IV SOLN: INTRAVENOUS | Status: DC

## 2021-03-06 MED ORDER — ORAL CARE MOUTH RINSE
15.0000 mL | Freq: Two times a day (BID) | OROMUCOSAL | Status: DC
  Administered 2021-03-06: 15 mL via OROMUCOSAL

## 2021-03-06 MED ORDER — POTASSIUM CHLORIDE 10 MEQ/100ML IV SOLN
10.0000 meq | INTRAVENOUS | Status: AC
Start: 2021-03-06 — End: 2021-03-06
  Administered 2021-03-06 (×2): 10 meq via INTRAVENOUS
  Filled 2021-03-06 (×3): qty 100

## 2021-03-06 NOTE — ED Notes (Signed)
Pt provided urinal for bedside use.

## 2021-03-06 NOTE — Discharge Summary (Signed)
Physician Discharge Summary  Jimmy Tucker Tucker [id number] DOB: 30-Apr-1961 DOA: 03/05/2021  PCP: [name], MD  Admit date: 03/05/2021  Discharge date: 03/06/2021  Admitted From:Home  Disposition:  Home  Recommendations for Outpatient Follow-up:  Follow up with PCP in 1-2 weeks Follow up with Dr. Laural Golden for EGD with dilation which will be scheduled Remain on PPI BID as prescribed Continue home medications as prior  Home Health:None  Equipment/Devices:None  Discharge Condition:Stable  CODE STATUS: Full  Diet recommendation: Liquid diet  Brief/Interim Summary: Per HPI: Jimmy Tucker is a 60 y.o. male with medical history significant for hypothyroidism, COPD, hypertension, esophageal adenocarcinoma status post esophagectomy (2016) at [facility], tobacco and alcohol use who presents to the emergency department due to 3-day onset (8/7) of inability to swallow any solid food or liquid, so he went to his PCP yesterday (9/9) and was told to go to the ED if he continues to have difficulty in being able to swallow, patient has not taken any of his meds since Wednesday (9/7).  Patient states that he has had recurrent strictures since surgery at [facility], he had an esophageal dilatation on 08/29/2019 by Dr. Laural Golden during which esophageal plaques which was consistent with candidiasis was noted.  A partial esophagectomy anastomosis was found, characterized by congestion, erythema and friable mucosa.  Hypopharynx and stomach were normal.  Patient denies chest pain, shortness of breath, headache, fever, chills.  He was scheduled to have a repeat upper endoscopy on 09/10/2020, but patient canceled the appointment due to wife being diagnosed with lung cancer and he has not rescheduled since then.  -Patient was admitted for evaluation of dysphagia via EGD due to suspicion of stricture/stenosis.  He was offered EGD with possible dilation today by GI, but states that he has had some  improvement and is able to tolerate some liquids.  He would like to defer EGD for now until later this week as he is able to tolerate liquids at this point in time along with his medications.  This was thought to be reasonable and he will be rescheduled by gastroenterology for EGD later this week.  No other acute events noted throughout the course of this admission and he is stable for discharge.  Discharge Diagnoses:  Principal Problem:   Dysphagia Active Problems:   Hypertension   Heavy cigarette smoker (20-39 per day)   Hyponatremia   Hypothyroidism   Thrombocytopenia (HCC)   Alcohol abuse   Esophageal stricture   Hypokalemia   Hyperglycemia   Elevated MCV   Elevated AST (SGOT)   Dehydration  Principal discharge diagnosis: Recurrent dysphagia likely secondary to esophageal stricture/stenosis.  Discharge Instructions  Discharge Instructions     Diet - low sodium heart healthy   Complete by: As directed    Increase activity slowly   Complete by: As directed       Allergies as of 03/06/2021       Reactions   Drug [tape]    PLEASE USE COBAN ,,,, ADHESIVE TAPE TEARS SKIN   Glycopyrrolate Other (See Comments)   Intolerance   Warfarin And Related Other (See Comments)   Excessive bleeding        Medication List     STOP taking these medications    losartan 50 MG tablet Commonly known as: COZAAR       TAKE these medications    albuterol 108 (90 Base) MCG/ACT inhaler Commonly known as: VENTOLIN HFA Inhale 2 puffs into the lungs every 4 (  four) hours as needed for wheezing or shortness of breath.   ALPRAZolam 1 MG tablet Commonly known as: XANAX Take 1 mg by mouth at bedtime as needed for anxiety.   amphetamine-dextroamphetamine 5 MG tablet Commonly known as: ADDERALL Take 1 tablet by mouth 2 (two) times daily.   aspirin EC 81 MG tablet Take 1 tablet (81 mg total) by mouth daily.   diphenoxylate-atropine 2.5-0.025 MG tablet Commonly known as:  LOMOTIL Take 1 tablet by mouth every 6 (six) hours as needed for diarrhea or loose stools.   folic acid 1 MG tablet Commonly known as: FOLVITE Take 1 tablet (1 mg total) by mouth daily.   levothyroxine 50 MCG tablet Commonly known as: SYNTHROID Take 50 mcg by mouth daily before breakfast.   nystatin 100000 UNIT/ML suspension Commonly known as: MYCOSTATIN Take 5 mLs (500,000 Units total) by mouth 4 (four) times daily.   Oxycodone HCl 20 MG Tabs Take 1 tablet (20 mg total) by mouth every 4 (four) hours as needed (pain).   pantoprazole 40 MG tablet Commonly known as: Protonix Take 1 tablet (40 mg total) by mouth 2 (two) times daily.   propranolol 20 MG tablet Commonly known as: INDERAL Take 20 mg by mouth 2 (two) times daily.   Tiotropium Bromide-Olodaterol 2.5-2.5 MCG/ACT Aers Inhale 2 puffs into the lungs daily.   Vitamin D3 50 MCG (2000 UT) Chew Chew 4,000 Units by mouth daily.        Follow-up Information     [name], MD. Schedule an appointment as soon as possible for a visit in 1 week(s).   Specialty: Internal Medicine Contact information: [address] [place] [id number] [phone]         [place]. Go to.   Contact information: [address] [place] [phone]               Allergies  Allergen Reactions   Drug [Tape]     PLEASE USE COBAN ,,,, ADHESIVE TAPE TEARS SKIN    Glycopyrrolate Other (See Comments)    Intolerance   Warfarin And Related Other (See Comments)    Excessive bleeding    Consultations: GI   Procedures/Studies: DG Chest Portable 1 View  Result Date: 03/05/2021 CLINICAL DATA:  Hypertension EXAM: PORTABLE CHEST 1 VIEW COMPARISON:  02/12/2015 FINDINGS: Small bilateral pleural effusions. No focal consolidation. Normal cardiac size. Aortic atherosclerosis. No pneumothorax. Probable air distension of esophagus with fluid level. IMPRESSION: 1. Small  bilateral effusions. 2. Suspected air distension of esophagus, with possible fluid level Electronically Signed   By: Donavan Foil M.D.   On: 03/05/2021 22:41     Discharge Exam: Vitals:   03/06/21 0953 03/06/21 1327  BP: (!) 142/73 90/67  Pulse: 64 84  Resp: 16 17  Temp: 98 F (36.7 C) 98.2 F (36.8 C)  SpO2: 100% 100%   Vitals:   03/06/21 0513 03/06/21 0627 03/06/21 0953 03/06/21 1327  BP:   (!) 142/73 90/67  Pulse: 94  64 84  Resp:   16 17  Temp:   98 F (36.7 C) 98.2 F (36.8 C)  TempSrc:   Oral   SpO2: 93%  100% 100%  Weight:  48.6 kg    Height:        General: Pt is alert, awake, not in acute distress Cardiovascular: RRR, S1/S2 +, no rubs, no gallops Respiratory: CTA bilaterally, no wheezing, no rhonchi Abdominal: Soft, NT, ND, bowel sounds + Extremities: no edema, no  cyanosis    The results of significant diagnostics from this hospitalization (including imaging, microbiology, ancillary and laboratory) are listed below for reference.     Microbiology: Recent Results (from the past 240 hour(s))  Resp Panel by RT-PCR (Flu A&B, Covid) Nasopharyngeal Swab     Status: None   Collection Time: 03/05/21 10:40 PM   Specimen: Nasopharyngeal Swab; Nasopharyngeal(NP) swabs in vial transport medium  Result Value Ref Range Status   SARS Coronavirus 2 by RT PCR NEGATIVE NEGATIVE Final    Comment: (NOTE) SARS-CoV-2 target nucleic acids are NOT DETECTED.  The SARS-CoV-2 RNA is generally detectable in upper respiratory specimens during the acute phase of infection. The lowest concentration of SARS-CoV-2 viral copies this assay can detect is 138 copies/mL. A negative result does not preclude SARS-Cov-2 infection and should not be used as the sole basis for treatment or other patient management decisions. A negative result may occur with  improper specimen collection/handling, submission of specimen other than nasopharyngeal swab, presence of viral mutation(s) within  the areas targeted by this assay, and inadequate number of viral copies(<138 copies/mL). A negative result must be combined with clinical observations, patient history, and epidemiological information. The expected result is Negative.  Fact Sheet for Patients:  EntrepreneurPulse.com.au  Fact Sheet for Healthcare Providers:  IncredibleEmployment.be  This test is no Tucker yet approved or cleared by the [place] FDA and  has been authorized for detection and/or diagnosis of SARS-CoV-2 by FDA under an Emergency Use Authorization (EUA). This EUA will remain  in effect (meaning this test can be used) for the duration of the COVID-19 declaration under Section 564(b)(1) of the Act, 21 U.S.C.section 360bbb-3(b)(1), unless the authorization is terminated  or revoked sooner.       Influenza A by PCR NEGATIVE NEGATIVE Final   Influenza B by PCR NEGATIVE NEGATIVE Final    Comment: (NOTE) The Xpert Xpress SARS-CoV-2/FLU/RSV plus assay is intended as an aid in the diagnosis of influenza from Nasopharyngeal swab specimens and should not be used as a sole basis for treatment. Nasal washings and aspirates are unacceptable for Xpert Xpress SARS-CoV-2/FLU/RSV testing.  Fact Sheet for Patients: EntrepreneurPulse.com.au  Fact Sheet for Healthcare Providers: IncredibleEmployment.be  This test is not yet approved or cleared by the [place] FDA and has been authorized for detection and/or diagnosis of SARS-CoV-2 by FDA under an Emergency Use Authorization (EUA). This EUA will remain in effect (meaning this test can be used) for the duration of the COVID-19 declaration under Section 564(b)(1) of the Act, 21 U.S.C. section 360bbb-3(b)(1), unless the authorization is terminated or revoked.  Performed at [facility], [address], [place], [place] [address]      Labs: BNP (last 3 results) No results for input(s): BNP  in the last 8760 hours. Basic Metabolic Panel: Recent Labs  Lab 03/05/21 2215 03/06/21 0605  NA 133* 135  K 3.4* 3.6  CL 95* 97*  CO2 26 28  GLUCOSE 139* 103*  BUN 8 7  CREATININE 0.84 0.69  CALCIUM 9.4 9.0   Liver Function Tests: Recent Labs  Lab 03/05/21 2215 03/06/21 0605  AST 53* 38  ALT 18 13  ALKPHOS 110 82  BILITOT 1.5* 1.0  PROT 8.2* 6.4*  ALBUMIN 3.9 3.0*   No results for input(s): LIPASE, AMYLASE in the last 168 hours. No results for input(s): AMMONIA in the last 168 hours. CBC: Recent Labs  Lab 03/05/21 2215 03/06/21 0605  WBC 6.5 4.0  NEUTROABS 4.9  --  HGB 15.9 13.4  HCT 46.4 40.2  MCV 100.7* 102.3*  PLT 149* 131*   Cardiac Enzymes: No results for input(s): CKTOTAL, CKMB, CKMBINDEX, TROPONINI in the last 168 hours. BNP: Invalid input(s): POCBNP CBG: No results for input(s): GLUCAP in the last 168 hours. D-Dimer No results for input(s): DDIMER in the last 72 hours. Hgb A1c No results for input(s): HGBA1C in the last 72 hours. Lipid Profile No results for input(s): CHOL, HDL, LDLCALC, TRIG, CHOLHDL, LDLDIRECT in the last 72 hours. Thyroid function studies No results for input(s): TSH, T4TOTAL, T3FREE, THYROIDAB in the last 72 hours.  Invalid input(s): FREET3 Anemia work up Recent Labs    03/06/21 0605  VITAMINB12 503  FOLATE 4.6*   Urinalysis    Component Value Date/Time   COLORURINE YELLOW 12/23/2017 2120   APPEARANCEUR CLEAR 12/23/2017 2120   LABSPEC 1.002 (L) 12/23/2017 2120   PHURINE 7.0 12/23/2017 2120   GLUCOSEU NEGATIVE 12/23/2017 2120   HGBUR SMALL (A) 12/23/2017 2120   BILIRUBINUR NEGATIVE 12/23/2017 2120   KETONESUR NEGATIVE 12/23/2017 2120   PROTEINUR NEGATIVE 12/23/2017 2120   UROBILINOGEN 1.0 11/11/2013 0618   NITRITE NEGATIVE 12/23/2017 2120   LEUKOCYTESUR NEGATIVE 12/23/2017 2120   Sepsis Labs Invalid input(s): PROCALCITONIN,  WBC,  LACTICIDVEN Microbiology Recent Results (from the past 240 hour(s))  Resp  Panel by RT-PCR (Flu A&B, Covid) Nasopharyngeal Swab     Status: None   Collection Time: 03/05/21 10:40 PM   Specimen: Nasopharyngeal Swab; Nasopharyngeal(NP) swabs in vial transport medium  Result Value Ref Range Status   SARS Coronavirus 2 by RT PCR NEGATIVE NEGATIVE Final    Comment: (NOTE) SARS-CoV-2 target nucleic acids are NOT DETECTED.  The SARS-CoV-2 RNA is generally detectable in upper respiratory specimens during the acute phase of infection. The lowest concentration of SARS-CoV-2 viral copies this assay can detect is 138 copies/mL. A negative result does not preclude SARS-Cov-2 infection and should not be used as the sole basis for treatment or other patient management decisions. A negative result may occur with  improper specimen collection/handling, submission of specimen other than nasopharyngeal swab, presence of viral mutation(s) within the areas targeted by this assay, and inadequate number of viral copies(<138 copies/mL). A negative result must be combined with clinical observations, patient history, and epidemiological information. The expected result is Negative.  Fact Sheet for Patients:  EntrepreneurPulse.com.au  Fact Sheet for Healthcare Providers:  IncredibleEmployment.be  This test is no Tucker yet approved or cleared by the [place] FDA and  has been authorized for detection and/or diagnosis of SARS-CoV-2 by FDA under an Emergency Use Authorization (EUA). This EUA will remain  in effect (meaning this test can be used) for the duration of the COVID-19 declaration under Section 564(b)(1) of the Act, 21 U.S.C.section 360bbb-3(b)(1), unless the authorization is terminated  or revoked sooner.       Influenza A by PCR NEGATIVE NEGATIVE Final   Influenza B by PCR NEGATIVE NEGATIVE Final    Comment: (NOTE) The Xpert Xpress SARS-CoV-2/FLU/RSV plus assay is intended as an aid in the diagnosis of influenza from Nasopharyngeal  swab specimens and should not be used as a sole basis for treatment. Nasal washings and aspirates are unacceptable for Xpert Xpress SARS-CoV-2/FLU/RSV testing.  Fact Sheet for Patients: EntrepreneurPulse.com.au  Fact Sheet for Healthcare Providers: IncredibleEmployment.be  This test is not yet approved or cleared by the [place] FDA and has been authorized for detection and/or diagnosis of SARS-CoV-2 by FDA under an Emergency Use Authorization (EUA).  This EUA will remain in effect (meaning this test can be used) for the duration of the COVID-19 declaration under Section 564(b)(1) of the Act, 21 U.S.C. section 360bbb-3(b)(1), unless the authorization is terminated or revoked.  Performed at [facility], [address] [place], [place] [address]      Time coordinating discharge: 35 minutes  SIGNED:   Rodena Goldmann, DO [facility] 03/06/2021, 1:41 PM  If 7PM-7AM, please contact night-coverage [email or web address]

## 2021-03-06 NOTE — Progress Notes (Signed)
Patient discharged home today, transported home by spouse. Discharge paperwork went over with patient and spouse, both verbalized understanding. Belongings sent home with patient. Patient stable upon discharge.

## 2021-03-06 NOTE — Consult Note (Signed)
Consulting  Provider: Dr. Manuella Ghazi Primary Care Physician:  [name], MD Primary Gastroenterologist:  Dr. Laural Golden  Reason for Consultation:  Dysphagia  HPI:  Jimmy Tucker is a 60 y.o. male with a past medical history of esophageal adenocarcinoma status post esophagectomy 2016 at [facility] complicated by postoperative leak and recurrent strictures, COPD, dyslipidemia, hypertension, chronic tobacco and alcohol use, who was admitted to [facility] yesterday evening after initially presenting with dysphagia and potential food obstruction.  Patient states on 9 7 he was eating a miniature hotdog and felt as though it got stuck in his substernal region.  He tried all his normal tactics to get this to pass which did not help.  He presented to the ER last night as he could not take it anymore.  At some point during the evaluation.  This spontaneously resolved with ice chips.  Patient this morning states that he is no longer having obstructive symptoms.  Tolerating secretions without issues.  He is adamant that he wants to try clear liquids immediately.  In terms of his esophageal cancer, the patient is following at [facility].  He had a CT of the chest, abdomen and pelvis with IV contrast on 05/13/2020 which was completely unremarkable, without evidence of recurrence of malignancy.   Last EGD: 08/29/2019 - A partial esophagectomy anastomosis was found at the esophageal anastomosis. This was characterized by congestion, erythema and friable mucosa. Scope could not be passed across it. A TTS dilator was passed through the scope. Dilation with a 12-13.5-15 mm balloon dilator was performed to 12 mm, 13.5 mm and 15 mm. The dilation site was examined and showed moderate mucosal disruption, mild improvement in luminal narrowing and no perforation. Last Colonoscopy: 2013 - pandivierticulosis, no polyps  Past Medical History:  Diagnosis Date   Abnormal nuclear stress test    Anxiety     Cancer ([place])    Carotid artery disease (HCC)    Chronic back pain    Chronic lower back pain    "L4-5"   COPD (chronic obstructive pulmonary disease) ([place])    Esophageal cancer ([place])    Heavy cigarette smoker (20-39 per day)    Unmotivated   Hyperlipidemia LDL goal <70    Intolerant to statins   Hypertension    Lumbar disc disease    PAD (peripheral artery disease) ([place]) 10/30/2012   a) 2011 LEA Dopplers: Left ATA occlusion; b) 10/2013: LEA Dopplers - L Common Iliac & CFA occlusion - w/ short reconstitution at the branch point of the external and internal iliac, the external iliac is occluded through the common femoral and proximal SFA; reconstitutes in the proximal SFA. Two-vessel runoff beyond.; c) s/p LCIA Stent & CFA EA w/ patch angioplasty --> f/u doppler 12/01/2013 patent   Pollen allergies    Presence of stent in artery     Past Surgical History:  Procedure Laterality Date   BACK SURGERY  02/2008   CARDIAC CATHETERIZATION N/A 02/15/2015   Procedure: Left Heart Cath and Coronary Angiography;  Surgeon: Lorretta Harp, MD;  Location: [place] CV LAB;  Service: Cardiovascular;  Laterality: N/A;   COLONOSCOPY  07/24/2011   Procedure: COLONOSCOPY;  Surgeon: Daneil Dolin, MD;  [facility];  Service: Endoscopy;  Laterality: N/A;  8:15   CORONARY STENT PLACEMENT     ENDARTERECTOMY FEMORAL Left 11/11/2013   Procedure: Left Femoral Endarterectomy with Patch Angioplasty ;  Surgeon: Angelia Mould, MD;  Location: [place];  Service: Vascular;  Laterality: Left;   ESOPHAGEAL DILATION N/A 08/29/2019   Procedure: ESOPHAGEAL DILATION;  Surgeon: Rogene Houston, MD;  [facility];  Service: Endoscopy;  Laterality: N/A;   ESOPHAGOGASTRODUODENOSCOPY N/A 10/09/2014   Procedure: ESOPHAGOGASTRODUODENOSCOPY (EGD);  Surgeon: Rogene Houston, MD;  [facility];  Service: Endoscopy;  Laterality: N/A;  230 - moved to 9:55 - Ann notified pt to arrive at 8:55    ESOPHAGOGASTRODUODENOSCOPY (EGD) WITH PROPOFOL N/A 07/21/2019   Procedure: ESOPHAGOGASTRODUODENOSCOPY (EGD) WITH PROPOFOL;  Surgeon: Rogene Houston, MD;  [facility];  Service: Endoscopy;  Laterality: N/A;  11:55   ESOPHAGOGASTRODUODENOSCOPY (EGD) WITH PROPOFOL N/A 08/29/2019   Procedure: ESOPHAGOGASTRODUODENOSCOPY (EGD) WITH PROPOFOL;  Surgeon: Rogene Houston, MD;  [facility];  Service: Endoscopy;  Laterality: N/A;  730   ILIAC ARTERY STENT Left 11/03/2013   Dr. Judithann Sauger   IR FLUORO GUIDED NEEDLE [place] ASPIRATION/INJECTION LOC  08/03/2018   LOWER EXTREMITY ANGIOGRAM N/A 11/03/2013   Procedure: LOWER EXTREMITY ANGIOGRAM;  Surgeon: Lorretta Harp, MD;  Location: [facility] CATH LAB;  Service: Cardiovascular;  Laterality: N/A;   LUMBAR MICRODISCECTOMY Left 02/2008   L4-5   NM MYOCAR PERF WALL MOTION  07/2009   persantine - normal perfusion   SURGERY SCROTAL / TESTICULAR Left    cyst excision   THORACIC ESOPHAGUS REPLACEMENT      Prior to Admission medications   Medication Sig Start Date End Date Taking? Authorizing Provider  albuterol (VENTOLIN HFA) 108 (90 Base) MCG/ACT inhaler Inhale 2 puffs into the lungs every 4 (four) hours as needed for wheezing or shortness of breath.    Yes [provider]  ALPRAZolam Duanne Moron) 1 MG tablet Take 1 mg by mouth at bedtime as needed for anxiety. 05/22/11  Yes [provider]  amphetamine-dextroamphetamine (ADDERALL) 5 MG tablet Take 1 tablet by mouth 2 (two) times daily. 03/03/21  Yes [provider]  aspirin EC 81 MG tablet Take 1 tablet (81 mg total) by mouth daily. 09/01/19  Yes Rehman, Mechele Dawley, MD  Cholecalciferol (VITAMIN D3) 50 MCG (2000 UT) CHEW Chew 4,000 Units by mouth daily.   Yes [provider]  diphenoxylate-atropine (LOMOTIL) 2.5-0.025 MG tablet Take 1 tablet by mouth every 6 (six) hours as needed for diarrhea or loose stools.    Yes [provider]  levothyroxine (SYNTHROID) 50 MCG tablet Take  50 mcg by mouth daily before breakfast.    Yes [provider]  Oxycodone HCl 20 MG TABS Take 1 tablet (20 mg total) by mouth every 4 (four) hours as needed (pain). 11/11/13  Yes Rhyne, Hulen Shouts, PA-C  propranolol (INDERAL) 20 MG tablet Take 20 mg by mouth 2 (two) times daily.    Yes [provider]  Tiotropium Bromide-Olodaterol 2.5-2.5 MCG/ACT AERS Inhale 2 puffs into the lungs daily.   Yes [provider]  folic acid (FOLVITE) 1 MG tablet Take 1 tablet (1 mg total) by mouth daily. Patient not taking: No sig reported 05/01/14   Leonie Man, MD  losartan (COZAAR) 50 MG tablet Take 0.5 tablets (25 mg total) by mouth daily. Patient not taking: No sig reported 11/04/13   Brett Canales, PA-C  nystatin (MYCOSTATIN) 100000 UNIT/ML suspension Take 5 mLs (500,000 Units total) by mouth 4 (four) times daily. Patient not taking: No sig reported 08/29/19   Rogene Houston, MD    Current Facility-Administered Medications  Medication Dose Route Frequency Provider Last Rate Last  Admin   0.9 %  sodium chloride infusion   Intravenous Continuous Adefeso, Oladapo, DO 75 mL/hr at 03/06/21 0641 New Bag at 03/06/21 0641   labetalol (NORMODYNE) injection 5 mg  5 mg Intravenous Q6H PRN Adefeso, Oladapo, DO       MEDLINE mouth rinse  15 mL Mouth Rinse BID Manuella Ghazi, Pratik D, DO   15 mL at 03/06/21 1040   ondansetron (ZOFRAN) tablet 4 mg  4 mg Oral Q6H PRN Adefeso, Oladapo, DO       Or   ondansetron (ZOFRAN) injection 4 mg  4 mg Intravenous Q6H PRN Adefeso, Oladapo, DO       oxyCODONE (Oxy IR/ROXICODONE) immediate release tablet 20 mg  20 mg Oral Q4H PRN Manuella Ghazi, Pratik D, DO        Allergies as of 03/05/2021 - Review Complete 03/05/2021  Allergen Reaction Noted   Drug [tape]  07/21/2019   Glycopyrrolate Other (See Comments) 04/19/2017   Warfarin and related Other (See Comments) 08/02/2018    Family History  Problem Relation Age of Onset   Heart attack Father 77       Cardiac arrest    Sudden death Father    Stroke Brother 52   Stroke Mother    Colon cancer Neg Hx     Social History   Socioeconomic History   Marital status: Married    Spouse name: Not on file   Number of children: Not on file   Years of education: Not on file   Highest education level: Not on file  Occupational History   Occupation: [employer]     Comment: in [place] Use   Smoking status: Every Day    Packs/day: 0.50    Years: 40.00    Pack years: 20.00    Types: Cigarettes    Last attempt to quit: 11/02/2013    Years since quitting: 7.3   Smokeless tobacco: Never  Vaping Use   Vaping Use: Former  Substance and Sexual Activity   Alcohol use: Yes    Alcohol/week: 21.0 standard drinks    Types: 21 Cans of beer per week    Comment: 1-2 beers per day.   Drug use: No   Sexual activity: Not Currently  Other Topics Concern   Not on file  Social History Narrative   Married, still smokes about a pack a day. Unable to work currently because of his claudication.   He does work for [employer] in [place], [place].   Social Determinants of Health   Financial Resource Strain: Not on file  Food Insecurity: Not on file  Transportation Needs: Not on file  Physical Activity: Not on file  Stress: Not on file  Social Connections: Not on file  Intimate Partner Violence: Not on file    Review of Systems: General: Negative for anorexia, weight loss, fever, chills, fatigue, weakness. Eyes: Negative for vision changes.  ENT: Negative for hoarseness, difficulty swallowing , nasal congestion. CV: Negative for chest pain, angina, palpitations, dyspnea on exertion, peripheral edema.  Respiratory: Negative for dyspnea at rest, dyspnea on exertion, cough, sputum, wheezing.  GI: See history of present illness. GU:  Negative for dysuria, hematuria, urinary incontinence, urinary frequency, nocturnal urination.  MS: Negative for joint pain, low back pain.  Derm: Negative for  rash or itching.  Neuro: Negative for weakness, abnormal sensation, seizure, frequent headaches, memory loss, confusion.  Psych: Negative for anxiety, depression Endo: Negative for unusual weight change.  Heme: Negative for  bruising or bleeding. Allergy: Negative for rash or hives.  Physical Exam: Vital signs in last 24 hours: Temp:  [97.8 F (36.6 C)-98.1 F (36.7 C)] 98 F (36.7 C) (09/11 0953) Pulse Rate:  [64-130] 64 (09/11 0953) Resp:  [16-20] 16 (09/11 0953) BP: (117-202)/(66-111) 142/73 (09/11 0953) SpO2:  [93 %-100 %] 100 % (09/11 0953) Weight:  [48.6 kg-54.4 kg] 48.6 kg (09/11 0627) Last BM Date: 03/05/21 General:   Alert,  Well-developed, well-nourished, pleasant and cooperative in NAD Head:  Normocephalic and atraumatic. Eyes:  Sclera clear, no icterus.   Conjunctiva pink. Ears:  Normal auditory acuity. Nose:  No deformity, discharge,  or lesions. Mouth:  No deformity or lesions, dentition normal. Neck:  Supple; no masses or thyromegaly. Lungs:  Clear throughout to auscultation.   No wheezes, crackles, or rhonchi. No acute distress. Heart:  Regular rate and rhythm; no murmurs, clicks, rubs,  or gallops. Abdomen:  Soft, nontender and nondistended. No masses, hepatosplenomegaly or hernias noted. Normal bowel sounds, without guarding, and without rebound.   Msk:  Symmetrical without gross deformities. Normal posture. Pulses:  Normal pulses noted. Extremities:  Without clubbing or edema. Neurologic:  Alert and  oriented x4;  grossly normal neurologically. Skin:  Intact without significant lesions or rashes. Cervical Nodes:  No significant cervical adenopathy. Psych:  Alert and cooperative. Normal mood and affect.  Intake/Output from previous day: 09/10 0701 - 09/11 0700 In: 1378.7 [I.V.:278.7; IV Piggyback:1100] Out: -  Intake/Output this shift: Total I/O In: 0  Out: 100 [Urine:100]  Lab Results: Recent Labs    03/05/21 2215 03/06/21 0605  WBC 6.5 4.0  HGB  15.9 13.4  HCT 46.4 40.2  PLT 149* 131*   BMET Recent Labs    03/05/21 2215 03/06/21 0605  NA 133* 135  K 3.4* 3.6  CL 95* 97*  CO2 26 28  GLUCOSE 139* 103*  BUN 8 7  CREATININE 0.84 0.69  CALCIUM 9.4 9.0   LFT Recent Labs    03/05/21 2215 03/06/21 0605  PROT 8.2* 6.4*  ALBUMIN 3.9 3.0*  AST 53* 38  ALT 18 13  ALKPHOS 110 82  BILITOT 1.5* 1.0   PT/INR Recent Labs    03/06/21 0605  LABPROT 12.8  INR 1.0   Hepatitis Panel No results for input(s): HEPBSAG, HCVAB, HEPAIGM, HEPBIGM in the last 72 hours. C-Diff No results for input(s): CDIFFTOX in the last 72 hours.  Studies/Results: DG Chest Portable 1 View  Result Date: 03/05/2021 CLINICAL DATA:  Hypertension EXAM: PORTABLE CHEST 1 VIEW COMPARISON:  02/12/2015 FINDINGS: Small bilateral pleural effusions. No focal consolidation. Normal cardiac size. Aortic atherosclerosis. No pneumothorax. Probable air distension of esophagus with fluid level. IMPRESSION: 1. Small bilateral effusions. 2. Suspected air distension of esophagus, with possible fluid level Electronically Signed   By: Donavan Foil M.D.   On: 03/05/2021 22:41    Impression: *Esophageal food impaction-resolved *Dysphagia-chronic *Recurrent esophageal strictures  *Adenocarcinoma of the esophagus status post esophagectomy 2016  Plan: Appears that patient suffered a food bolus from ingestion of a hot dog on 03/02/2021.  This spontaneously resolved last evening in the ER.  Patient states he is no longer obstructed, tolerating secretions.  I have recommended that he undergo EGD with dilation today.  Patient is adamant that he would like to have this done as an outpatient.  Counseled that he may become obstructed if we do not proceed now and he is insistent.  Okay to place on clear liquids.  If tolerated, okay  to discharge home.    I called and discussed case with Dr. Laural Golden who will try to get him in for urgent endoscopy with possible dilation this  week.  Recommend twice daily PPI in the meantime.  Counseled patient on keeping his diet to liquids only for now.  Thank you for the consultation.  Please call with any questions or concerns.  Elon Alas. Abbey Chatters, D.O. Gastroenterology and Hepatology [facility]    LOS: 0 days     03/06/2021, 11:47 AM

## 2021-03-07 ENCOUNTER — Encounter (INDEPENDENT_AMBULATORY_CARE_PROVIDER_SITE_OTHER): Payer: Self-pay | Admitting: *Deleted

## 2021-03-08 ENCOUNTER — Encounter (INDEPENDENT_AMBULATORY_CARE_PROVIDER_SITE_OTHER): Payer: Self-pay

## 2021-03-08 ENCOUNTER — Other Ambulatory Visit (INDEPENDENT_AMBULATORY_CARE_PROVIDER_SITE_OTHER): Payer: Self-pay

## 2021-03-08 ENCOUNTER — Encounter (HOSPITAL_COMMUNITY)
Admission: RE | Admit: 2021-03-08 | Discharge: 2021-03-08 | Disposition: A | Discharge status: Home / Self Care | Payer: Medicare Other | Source: Ambulatory Visit | Attending: Internal Medicine | Admitting: Internal Medicine

## 2021-03-08 ENCOUNTER — Other Ambulatory Visit: Payer: Self-pay

## 2021-03-08 DIAGNOSIS — Z01810 Encounter for preprocedural cardiovascular examination: Secondary | ICD-10-CM | POA: Insufficient documentation

## 2021-03-08 DIAGNOSIS — K222 Esophageal obstruction: Secondary | ICD-10-CM

## 2021-03-08 NOTE — Patient Instructions (Signed)
Jimmy Tucker  03/08/2021     '@PREFPERIOPPHARMACY'$ @   Your procedure is scheduled on 03/10/2021.       Report to [facility] at  1230 P.M.   Call this number if you have problems the morning of surgery:  [phone]   Remember:  Follow the diet instructions given to you by the office.      Use your inhaler before you come and bring your rescue inhaler with you.    Take these medicines the morning of surgery with A SIP OF WATER         adderall, levothyroxine, oxycodone (if needed), protonix.    Do not wear jewelry, make-up or nail polish.  Do not wear lotions, powders, or perfumes, or deodorant.  Do not shave 48 hours prior to surgery.  Men may shave face and neck.  Do not bring valuables to the hospital.  [facility] is not responsible for any belongings or valuables.  Contacts, dentures or bridgework may not be worn into surgery.  Leave your suitcase in the car.  After surgery it may be brought to your room.  For patients admitted to the hospital, discharge time will be determined by your treatment team.  Patients discharged the day of surgery will not be allowed to drive home and must have someone with them for 24 hours.    Special instructions:   DO NOT smoke tobacco or vape for 24 hours before your procedure.  Please read over the following fact sheets that you were given. Anesthesia Post-op Instructions and Care and Recovery After Surgery      Upper Endoscopy, Adult, Care After This sheet gives you information about how to care for yourself after your procedure. Your health care provider may also give you more specific instructions. If you have problems or questions, contact your health care provider. What can I expect after the procedure? After the procedure, it is common to have: A sore throat. Mild stomach pain or discomfort. Bloating. Nausea. Follow these instructions at home:  Follow instructions from your health care provider about what to  eat or drink after your procedure. Return to your normal activities as told by your health care provider. Ask your health care provider what activities are safe for you. Take over-the-counter and prescription medicines only as told by your health care provider. If you were given a sedative during the procedure, it can affect you for several hours. Do not drive or operate machinery until your health care provider says that it is safe. Keep all follow-up visits as told by your health care provider. This is important. Contact a health care provider if you have: A sore throat that lasts longer than one day. Trouble swallowing. Get help right away if: You vomit blood or your vomit looks like coffee grounds. You have: A fever. Bloody, black, or tarry stools. A severe sore throat or you cannot swallow. Difficulty breathing. Severe pain in your chest or abdomen. Summary After the procedure, it is common to have a sore throat, mild stomach discomfort, bloating, and nausea. If you were given a sedative during the procedure, it can affect you for several hours. Do not drive or operate machinery until your health care provider says that it is safe. Follow instructions from your health care provider about what to eat or drink after your procedure. Return to your normal activities as told by your health care provider. This information is not intended to replace  advice given to you by your health care provider. Make sure you discuss any questions you have with your health care provider. Document Revised: 06/10/2019 Document Reviewed: 11/12/2017 Elsevier Patient Education  2022 [place]. Esophageal Dilatation Esophageal dilatation, also called esophageal dilation, is a procedure to widen or open a blocked or narrowed part of the esophagus. The esophagus is the part of the body that moves food and liquid from the mouth to the stomach. You may need this procedure if: You have a buildup of scar tissue in  your esophagus that makes it difficult, painful, or impossible to swallow. This can be caused by gastroesophageal reflux disease (GERD). You have cancer of the esophagus. There is a problem with how food moves through your esophagus. In some cases, you may need this procedure repeated at a later time to dilate the esophagus gradually. Tell a health care provider about: Any allergies you have. All medicines you are taking, including vitamins, herbs, eye drops, creams, and over-the-counter medicines. Any problems you or family members have had with anesthetic medicines. Any blood disorders you have. Any surgeries you have had. Any medical conditions you have. Any antibiotic medicines you are required to take before dental procedures. Whether you are pregnant or may be pregnant. What are the risks? Generally, this is a safe procedure. However, problems may occur, including: Bleeding due to a tear in the lining of the esophagus. A hole, or perforation, in the esophagus. What happens before the procedure? Ask your health care provider about: Changing or stopping your regular medicines. This is especially important if you are taking diabetes medicines or blood thinners. Taking medicines such as aspirin and ibuprofen. These medicines can thin your blood. Do not take these medicines unless your health care provider tells you to take them. Taking over-the-counter medicines, vitamins, herbs, and supplements. Follow instructions from your health care provider about eating or drinking restrictions. Plan to have a responsible adult take you home from the hospital or clinic. Plan to have a responsible adult care for you for the time you are told after you leave the hospital or clinic. This is important. What happens during the procedure? You may be given a medicine to help you relax (sedative). A numbing medicine may be sprayed into the back of your throat, or you may gargle the medicine. Your health  care provider may perform the dilatation using various surgical instruments, such as: Simple dilators. This instrument is carefully placed in the esophagus to stretch it. Guided wire bougies. This involves using an endoscope to insert a wire into the esophagus. A dilator is passed over this wire to enlarge the esophagus. Then the wire is removed. Balloon dilators. An endoscope with a small balloon is inserted into the esophagus. The balloon is inflated to stretch the esophagus and open it up. The procedure may vary among health care providers and hospitals. What can I expect after the procedure? Your blood pressure, heart rate, breathing rate, and blood oxygen level will be monitored until you leave the hospital or clinic. Your throat may feel slightly sore and numb. This will get better over time. You will not be allowed to eat or drink until your throat is no longer numb. When you are able to drink, urinate, and sit on the edge of the bed without nausea or dizziness, you may be able to return home. Follow these instructions at home: Take over-the-counter and prescription medicines only as told by your health care provider. If you were given a  sedative during the procedure, it can affect you for several hours. Do not drive or operate machinery until your health care provider says that it is safe. Plan to have a responsible adult care for you for the time you are told. This is important. Follow instructions from your health care provider about any eating or drinking restrictions. Do not use any products that contain nicotine or tobacco, such as cigarettes, e-cigarettes, and chewing tobacco. If you need help quitting, ask your health care provider. Keep all follow-up visits. This is important. Contact a health care provider if: You have a fever. You have pain that is not relieved by medicine. Get help right away if: You have chest pain. You have trouble breathing. You have trouble  swallowing. You vomit blood. You have black, tarry, or bloody stools. These symptoms may represent a serious problem that is an emergency. Do not wait to see if the symptoms will go away. Get medical help right away. Call your local emergency services (911 in the [place] Do not drive yourself to the hospital. Summary Esophageal dilatation, also called esophageal dilation, is a procedure to widen or open a blocked or narrowed part of the esophagus. Plan to have a responsible adult take you home from the hospital or clinic. For this procedure, a numbing medicine may be sprayed into the back of your throat, or you may gargle the medicine. Do not drive or operate machinery until your health care provider says that it is safe. This information is not intended to replace advice given to you by your health care provider. Make sure you discuss any questions you have with your health care provider. Document Revised: 10/29/2019 Document Reviewed: 10/29/2019 Elsevier Patient Education  [place] After This sheet gives you information about how to care for yourself after your procedure. Your health care provider may also give you more specific instructions. If you have problems or questions, contact your health care provider. What can I expect after the procedure? After the procedure, it is common to have: Tiredness. Forgetfulness about what happened after the procedure. Impaired judgment for important decisions. Nausea or vomiting. Some difficulty with balance. Follow these instructions at home: For the time period you were told by your health care provider:   Rest as needed. Do not participate in activities where you could fall or become injured. Do not drive or use machinery. Do not drink alcohol. Do not take sleeping pills or medicines that cause drowsiness. Do not make important decisions or sign legal documents. Do not take care of children on your  own. Eating and drinking Follow the diet that is recommended by your health care provider. Drink enough fluid to keep your urine pale yellow. If you vomit: Drink water, juice, or soup when you can drink without vomiting. Make sure you have little or no nausea before eating solid foods. General instructions Have a responsible adult stay with you for the time you are told. It is important to have someone help care for you until you are awake and alert. Take over-the-counter and prescription medicines only as told by your health care provider. If you have sleep apnea, surgery and certain medicines can increase your risk for breathing problems. Follow instructions from your health care provider about wearing your sleep device: Anytime you are sleeping, including during daytime naps. While taking prescription pain medicines, sleeping medicines, or medicines that make you drowsy. Avoid smoking. Keep all follow-up visits as told by your health care  provider. This is important. Contact a health care provider if: You keep feeling nauseous or you keep vomiting. You feel light-headed. You are still sleepy or having trouble with balance after 24 hours. You develop a rash. You have a fever. You have redness or swelling around the IV site. Get help right away if: You have trouble breathing. You have new-onset confusion at home. Summary For several hours after your procedure, you may feel tired. You may also be forgetful and have poor judgment. Have a responsible adult stay with you for the time you are told. It is important to have someone help care for you until you are awake and alert. Rest as told. Do not drive or operate machinery. Do not drink alcohol or take sleeping pills. Get help right away if you have trouble breathing, or if you suddenly become confused. This information is not intended to replace advice given to you by your health care provider. Make sure you discuss any questions you  have with your health care provider. Document Revised: 02/26/2020 Document Reviewed: 05/15/2019 Elsevier Patient Education  2022 [employer].

## 2021-03-10 ENCOUNTER — Telehealth (INDEPENDENT_AMBULATORY_CARE_PROVIDER_SITE_OTHER): Payer: Self-pay | Admitting: Internal Medicine

## 2021-03-10 ENCOUNTER — Ambulatory Visit (HOSPITAL_COMMUNITY): Payer: Medicare Other | Admitting: Anesthesiology

## 2021-03-10 ENCOUNTER — Encounter (HOSPITAL_COMMUNITY)
Admission: RE | Disposition: A | Discharge status: Home / Self Care | Payer: Self-pay | Source: Home / Self Care | Attending: Internal Medicine

## 2021-03-10 ENCOUNTER — Encounter (HOSPITAL_COMMUNITY): Payer: Self-pay | Admitting: Internal Medicine

## 2021-03-10 ENCOUNTER — Ambulatory Visit (HOSPITAL_COMMUNITY)
Admission: RE | Admit: 2021-03-10 | Discharge: 2021-03-10 | Disposition: A | Discharge status: Home / Self Care | Payer: Medicare Other | Attending: Internal Medicine | Admitting: Internal Medicine

## 2021-03-10 DIAGNOSIS — Z79890 Hormone replacement therapy (postmenopausal): Secondary | ICD-10-CM | POA: Diagnosis not present

## 2021-03-10 DIAGNOSIS — K2289 Other specified disease of esophagus: Secondary | ICD-10-CM | POA: Insufficient documentation

## 2021-03-10 DIAGNOSIS — Z9049 Acquired absence of other specified parts of digestive tract: Secondary | ICD-10-CM | POA: Insufficient documentation

## 2021-03-10 DIAGNOSIS — K222 Esophageal obstruction: Secondary | ICD-10-CM | POA: Diagnosis not present

## 2021-03-10 DIAGNOSIS — R1310 Dysphagia, unspecified: Secondary | ICD-10-CM | POA: Diagnosis not present

## 2021-03-10 DIAGNOSIS — K229 Disease of esophagus, unspecified: Secondary | ICD-10-CM | POA: Diagnosis not present

## 2021-03-10 DIAGNOSIS — Z955 Presence of coronary angioplasty implant and graft: Secondary | ICD-10-CM | POA: Insufficient documentation

## 2021-03-10 DIAGNOSIS — Z8501 Personal history of malignant neoplasm of esophagus: Secondary | ICD-10-CM | POA: Diagnosis not present

## 2021-03-10 DIAGNOSIS — F17210 Nicotine dependence, cigarettes, uncomplicated: Secondary | ICD-10-CM | POA: Insufficient documentation

## 2021-03-10 DIAGNOSIS — R1314 Dysphagia, pharyngoesophageal phase: Secondary | ICD-10-CM | POA: Insufficient documentation

## 2021-03-10 DIAGNOSIS — Z91048 Other nonmedicinal substance allergy status: Secondary | ICD-10-CM | POA: Diagnosis not present

## 2021-03-10 DIAGNOSIS — Z79899 Other long term (current) drug therapy: Secondary | ICD-10-CM | POA: Insufficient documentation

## 2021-03-10 DIAGNOSIS — I2510 Atherosclerotic heart disease of native coronary artery without angina pectoris: Secondary | ICD-10-CM | POA: Diagnosis not present

## 2021-03-10 DIAGNOSIS — Z888 Allergy status to other drugs, medicaments and biological substances status: Secondary | ICD-10-CM | POA: Insufficient documentation

## 2021-03-10 DIAGNOSIS — Z9889 Other specified postprocedural states: Secondary | ICD-10-CM | POA: Diagnosis not present

## 2021-03-10 DIAGNOSIS — Z7982 Long term (current) use of aspirin: Secondary | ICD-10-CM | POA: Insufficient documentation

## 2021-03-10 HISTORY — PX: ESOPHAGEAL DILATION: SHX303

## 2021-03-10 HISTORY — PX: ESOPHAGOGASTRODUODENOSCOPY (EGD) WITH PROPOFOL: SHX5813

## 2021-03-10 LAB — KOH PREP: Special Requests: NORMAL

## 2021-03-10 SURGERY — ESOPHAGOGASTRODUODENOSCOPY (EGD) WITH PROPOFOL
Anesthesia: General

## 2021-03-10 MED ORDER — PROPOFOL 10 MG/ML IV BOLUS
INTRAVENOUS | Status: DC | PRN
  Administered 2021-03-10: 30 mg via INTRAVENOUS
  Administered 2021-03-10: 80 mg via INTRAVENOUS
  Administered 2021-03-10: 50 mg via INTRAVENOUS
  Administered 2021-03-10: 40 mg via INTRAVENOUS

## 2021-03-10 MED ORDER — LIDOCAINE HCL (CARDIAC) PF 100 MG/5ML IV SOSY
PREFILLED_SYRINGE | INTRAVENOUS | Status: DC | PRN
  Administered 2021-03-10: 100 mg via INTRAVENOUS

## 2021-03-10 MED ORDER — LACTATED RINGERS IV SOLN: INTRAVENOUS | Status: DC

## 2021-03-10 MED ORDER — SODIUM CHLORIDE FLUSH 0.9 % IV SOLN
INTRAVENOUS | Status: AC
  Filled 2021-03-10: qty 10

## 2021-03-10 MED ORDER — STERILE WATER FOR IRRIGATION IR SOLN
Status: DC | PRN
  Administered 2021-03-10: 1.5 mL

## 2021-03-10 MED ORDER — NYSTATIN 100000 UNIT/ML MT SUSP: 5.0000 mL | Freq: Four times a day (QID) | OROMUCOSAL | 1 refills | Status: DC

## 2021-03-10 NOTE — Discharge Instructions (Addendum)
No aspirin or NSAIDs for 3 days. Mycostatin suspension 1 teaspoonful swish and swallow 4 times a day until prescription runs out. Resume other medications and mechanical soft diet. Please go to [employer] store in [place] and buy foods that you can swallow. Repeat dilation in 3 to 4 weeks.    OFFICE TO CONTACT PATIENT WITH FOLLOW UP APPOINTMENT FOR REPEAT DILATION

## 2021-03-10 NOTE — Transfer of Care (Signed)
Immediate Anesthesia Transfer of Care Note  Patient: Jimmy Tucker  Procedure(s) Performed: ESOPHAGOGASTRODUODENOSCOPY (EGD) WITH PROPOFOL ESOPHAGEAL DILATION  Patient Location: Short Stay  Anesthesia Type:General  Level of Consciousness: awake, alert  and oriented  Airway & Oxygen Therapy: Patient Spontanous Breathing  Post-op Assessment: Report given to RN and Post -op Vital signs reviewed and stable  Post vital signs: Reviewed and stable  Last Vitals:  Vitals Value Taken Time  BP    Temp    Pulse 90 03/10/21  1443  Resp 15 03/10/21  1443  SpO2 100 03/10/21  1443    Last Pain:  Vitals:   03/10/21 1417  TempSrc:   PainSc: 9          Complications: No notable events documented.

## 2021-03-10 NOTE — Op Note (Signed)
[facility] Patient Name: Jimmy Tucker Procedure Date: 03/10/2021 1:39 PM MRN: [id number] Date of Birth: [birth year] Attending MD: Hildred Laser , MD CSN: [id number] Age: 60 Admit Type: Outpatient Procedure:                Upper GI endoscopy Indications:              Esophageal dysphagia. Patient history of                            esophagectomy and anastamotic stricture Providers:                Hildred Laser, MD, Charlsie Quest. Theda Sers RN, RN,                            Raphael Gibney, Technician Referring MD:             [name], MD Medicines:                Propofol per Anesthesia Complications:            No immediate complications. Estimated Blood Loss:     Estimated blood loss was minimal. Procedure:                Pre-Anesthesia Assessment:                           - Prior to the procedure, a History and Physical                            was performed, and patient medications and                            allergies were reviewed. The patient's tolerance of                            previous anesthesia was also reviewed. The risks                            and benefits of the procedure and the sedation                            options and risks were discussed with the patient.                            All questions were answered, and informed consent                            was obtained. Prior Anticoagulants: The patient has                            taken no previous anticoagulant or antiplatelet                            agents except for aspirin. ASA Grade Assessment:  III - A patient with severe systemic disease. After                            reviewing the risks and benefits, the patient was                            deemed in satisfactory condition to undergo the                            procedure.                           After obtaining informed consent, the endoscope was                            passed under  direct vision. Throughout the                            procedure, the patient's blood pressure, pulse, and                            oxygen saturations were monitored continuously. The                            [id number]-[id number]) scope was introduced through the                            mouth, and advanced to the second part of duodenum.                            The upper GI endoscopy was accomplished without                            difficulty. The patient tolerated the procedure                            well. Scope In: 2:23:47 PM Scope Out: 2:37:41 PM Total Procedure Duration: 0 hours 13 minutes 54 seconds  Findings:      The hypopharynx was normal.      Patchy, white plaques were found in the proximal esophagus. Cells for       cytology were obtained by brushing.      One benign-appearing, intrinsic severe stenosis was found 23 cm from the       incisors. This stenosis measured 7 mm (inner diameter) x 1 cm (in       length). The stenosis was traversed after downsizing scope. The dilation       site was examined and showed mild mucosal disruption, mild improvement       in luminal narrowing and no perforation.      The entire examined stomach was normal.      The duodenal bulb and second portion of the duodenum were normal. Impression:               - Normal hypopharynx.                           -  Esophageal plaques were found, suspicious for                            candidiasis. Cells for cytology obtained.                           - Benign-appearing esophageal stenosis.                           - Normal stomach.                           - Normal duodenal bulb and second portion of the                            duodenum. Moderate Sedation:      Per Anesthesia Care Recommendation:           - Patient has a contact number available for                            emergencies. The signs and symptoms of potential                            delayed complications were  discussed with the                            patient. Return to normal activities tomorrow.                            Written discharge instructions were provided to the                            patient.                           - Mechanical soft diet today.                           - Continue present medications.                           - No aspirin, ibuprofen, naproxen, or other                            non-steroidal anti-inflammatory drugs for 3 days.                           - Repeat upper endoscopy in 4 weeks. Procedure Code(s):        --- Professional ---                           [phone], Esophagogastroduodenoscopy, flexible,                            transoral; diagnostic, including collection of  specimen(s) by brushing or washing, when performed                            (separate procedure) Diagnosis Code(s):        --- Professional ---                           K22.9, Disease of esophagus, unspecified                           K22.2, Esophageal obstruction                           R13.14, Dysphagia, pharyngoesophageal phase CPT copyright 2019 American Medical Association. All rights reserved. The codes documented in this report are preliminary and upon coder review may  be revised to meet current compliance requirements. Hildred Laser, MD Hildred Laser, MD 03/10/2021 2:53:37 PM This report has been signed electronically. Number of Addenda: 0

## 2021-03-10 NOTE — Anesthesia Postprocedure Evaluation (Signed)
Anesthesia Post Note  Patient: Jimmy Tucker  Procedure(s) Performed: ESOPHAGOGASTRODUODENOSCOPY (EGD) WITH PROPOFOL ESOPHAGEAL DILATION  Patient location during evaluation: Phase II Anesthesia Type: General Level of consciousness: awake and alert and oriented Pain management: pain level controlled Vital Signs Assessment: post-procedure vital signs reviewed and stable Respiratory status: spontaneous breathing and respiratory function stable Cardiovascular status: blood pressure returned to baseline and stable Postop Assessment: no apparent nausea or vomiting Anesthetic complications: no   No notable events documented.   Last Vitals:  Vitals:   03/10/21 1251 03/10/21 1441  BP: (!) 159/83 126/84  Pulse: 70 83  Resp: 19 16  Temp: 36.8 C (!) 36.3 C  SpO2: 100% 100%    Last Pain:  Vitals:   03/10/21 1441  TempSrc: Axillary  PainSc: 0-No pain                 Branon Sabine C Powell Halbert

## 2021-03-10 NOTE — Telephone Encounter (Signed)
Christine from [facility] called stated patient needs a repeat EGD in 3 to 4 weeks

## 2021-03-10 NOTE — H&P (Signed)
Jimmy Tucker is an 60 y.o. male.   Chief Complaint: Patient is here for esophagogastroduodenoscopy with esophageal dilation. 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  He developed recalcitrant anastomotic stricture which has been dilated on multiple occasions.  Most recently dilation was in March 2021.  He was briefly hospitalized over the weekend for food impaction resolved spontaneously.  He wanted to be discharged and come for an outpatient dilation.  He continues to lose weight.  He is not able to swallow because he has very poor dentition.  He has not been able to find a dentist who would help him. His last visit with oncologist at [facility] in November 2021 and he had no evidence of recurrent disease.  Past Medical History:  Diagnosis Date   Abnormal nuclear stress test    Anxiety    Cancer (HCC)    Carotid artery disease (HCC)    Chronic back pain    Chronic lower back pain    "L4-5"   COPD (chronic obstructive pulmonary disease) (HCC)    Esophageal cancer (HCC)    Heavy cigarette smoker (20-39 per day)    Unmotivated   Hyperlipidemia LDL goal <70    Intolerant to statins   Hypertension    Lumbar disc disease    PAD (peripheral artery disease) ([place]) 10/30/2012   a) 2011 LEA Dopplers: Left ATA occlusion; b) 10/2013: LEA Dopplers - L Common Iliac & CFA occlusion - w/ short reconstitution at the branch point of the external and internal iliac, the external iliac is occluded through the common femoral and proximal SFA; reconstitutes in the proximal SFA. Two-vessel runoff beyond.; c) s/p LCIA Stent & CFA EA w/ patch angioplasty --> f/u doppler 12/01/2013 patent   Pollen allergies    Presence of stent in artery     Past Surgical History:  Procedure Laterality Date   BACK SURGERY  02/2008   CARDIAC CATHETERIZATION N/A 02/15/2015   Procedure: Left Heart Cath and Coronary Angiography;   Surgeon: Lorretta Harp, MD;  Location: [facility];  Service: Cardiovascular;  Laterality: N/A;   COLONOSCOPY  07/24/2011   Procedure: COLONOSCOPY;  Surgeon: Daneil Dolin, MD;  [facility];  Service: Endoscopy;  Laterality: N/A;  8:15   CORONARY STENT PLACEMENT     ENDARTERECTOMY FEMORAL Left 11/11/2013   Procedure: Left Femoral Endarterectomy with Patch Angioplasty ;  Surgeon: Angelia Mould, MD;  Location: [place];  Service: Vascular;  Laterality: Left;   ESOPHAGEAL DILATION N/A 08/29/2019   Procedure: ESOPHAGEAL DILATION;  Surgeon: Rogene Houston, MD;  [facility];  Service: Endoscopy;  Laterality: N/A;   ESOPHAGOGASTRODUODENOSCOPY N/A 10/09/2014   Procedure: ESOPHAGOGASTRODUODENOSCOPY (EGD);  Surgeon: Rogene Houston, MD;  [facility];  Service: Endoscopy;  Laterality: N/A;  230 - moved to 9:55 - Ann notified pt to arrive at 8:55   ESOPHAGOGASTRODUODENOSCOPY (EGD) WITH PROPOFOL N/A 07/21/2019   Procedure: ESOPHAGOGASTRODUODENOSCOPY (EGD) WITH PROPOFOL;  Surgeon: Rogene Houston, MD;  [facility];  Service: Endoscopy;  Laterality: N/A;  11:55   ESOPHAGOGASTRODUODENOSCOPY (EGD) WITH PROPOFOL N/A 08/29/2019   Procedure: ESOPHAGOGASTRODUODENOSCOPY (EGD) WITH PROPOFOL;  Surgeon: Rogene Houston, MD;  [facility];  Service: Endoscopy;  Laterality: N/A;  730   ILIAC ARTERY STENT Left 11/03/2013   Dr. Judithann Sauger   IR FLUORO GUIDED NEEDLE [facility] ASPIRATION/INJECTION LOC  08/03/2018   LOWER EXTREMITY ANGIOGRAM N/A 11/03/2013   Procedure: LOWER EXTREMITY ANGIOGRAM;  Surgeon: Lorretta Harp, MD;  Location: [facility] CATH LAB;  Service: Cardiovascular;  Laterality: N/A;   LUMBAR MICRODISCECTOMY Left 02/2008   L4-5   NM MYOCAR PERF WALL MOTION  07/2009   persantine - normal perfusion   SURGERY SCROTAL / TESTICULAR Left    cyst excision   THORACIC ESOPHAGUS REPLACEMENT      Family History  Problem Relation Age of Onset   Heart attack Father 75        Cardiac arrest   Sudden death Father    Stroke Brother 60   Stroke Mother    Colon cancer Neg Hx    Social History:  reports that he has been smoking cigarettes. He has a 20.00 pack-year smoking history. He has never used smokeless tobacco. He reports current alcohol use of about 21.0 standard drinks per week. He reports that he does not use drugs.  Allergies:  Allergies  Allergen Reactions   Drug [Tape]     PLEASE USE COBAN ,,,, ADHESIVE TAPE TEARS SKIN    Glycopyrrolate Other (See Comments)    Intolerance   Warfarin And Related Other (See Comments)    Excessive bleeding    Medications Prior to Admission  Medication Sig Dispense Refill   ALPRAZolam (XANAX) 1 MG tablet Take 1 mg by mouth at bedtime as needed for anxiety.     amphetamine-dextroamphetamine (ADDERALL) 5 MG tablet Take 1 tablet by mouth 2 (two) times daily.     Cholecalciferol (VITAMIN D3) 50 MCG (2000 UT) CHEW Chew 4,000 Units by mouth daily.     diphenoxylate-atropine (LOMOTIL) 2.5-0.025 MG tablet Take 1 tablet by mouth every 6 (six) hours as needed for diarrhea or loose stools.      folic acid (FOLVITE) 1 MG tablet Take 1 tablet (1 mg total) by mouth daily. 30 tablet 5   levothyroxine (SYNTHROID) 50 MCG tablet Take 50 mcg by mouth daily before breakfast.      Oxycodone HCl 20 MG TABS Take 1 tablet (20 mg total) by mouth every 4 (four) hours as needed (pain). 30 tablet 0   pantoprazole (PROTONIX) 40 MG tablet Take 1 tablet (40 mg total) by mouth 2 (two) times daily. 30 tablet 1   propranolol (INDERAL) 20 MG tablet Take 20 mg by mouth 2 (two) times daily.      albuterol (VENTOLIN HFA) 108 (90 Base) MCG/ACT inhaler Inhale 2 puffs into the lungs every 4 (four) hours as needed for wheezing or shortness of breath.      aspirin EC 81 MG tablet Take 1 tablet (81 mg total) by mouth daily.     nystatin (MYCOSTATIN) 100000 UNIT/ML suspension Take 5 mLs (500,000 Units total) by mouth 4 (four) times daily. (Patient not  taking: No sig reported) 240 mL 2   Tiotropium Bromide-Olodaterol 2.5-2.5 MCG/ACT AERS Inhale 2 puffs into the lungs daily.      No results found for this or any previous visit (from the past 48 hour(s)). No results found.  Review of Systems  Blood pressure (!) 159/83, pulse 70, temperature 98.2 F (36.8 C), temperature source Oral, resp. rate 19, SpO2 100 %. Physical Exam Constitutional:      Comments: Patient is very thin.  HENT:     Mouth/Throat:     Mouth: Mucous membranes are moist.     Pharynx: Oropharynx is clear.  Eyes:     General: No scleral icterus.  Conjunctiva/sclera: Conjunctivae normal.  Cardiovascular:     Rate and Rhythm: Normal rate and regular rhythm.     Heart sounds: Normal heart sounds. No murmur heard. Pulmonary:     Effort: Pulmonary effort is normal.     Breath sounds: Normal breath sounds.  Abdominal:     General: Abdomen is flat. There is no distension.     Palpations: There is no mass.     Tenderness: There is no abdominal tenderness.  Musculoskeletal:        General: No swelling.     Cervical back: Neck supple.  Lymphadenopathy:     Cervical: No cervical adenopathy.  Skin:    General: Skin is warm and dry.  Neurological:     Mental Status: He is alert.     Assessment/Plan  Esophageal dysphagia secondary to known esophagogastric anastomotic stricture. Status post esophagectomy for adenocarcinoma 6 years ago. Esophagogastroduodenoscopy with esophageal dilation.  Hildred Laser, MD 03/10/2021, 2:15 PM

## 2021-03-10 NOTE — Anesthesia Preprocedure Evaluation (Addendum)
Anesthesia Evaluation  Patient identified by MRN, date of birth, ID band Patient awake  General Assessment Comment:Reports difficulty swallowing after an anesthetic  Unable to name drug  Will ask Dr. Laural Golden if he remembers  Reviewed: Allergy & Precautions, NPO status , Patient's Chart, lab work & pertinent test results, reviewed documented beta blocker date and time   History of Anesthesia Complications (+) history of anesthetic complications  Airway Mallampati: II  TM Distance: >3 FB Neck ROM: Full    Dental  (+) Poor Dentition, Dental Advisory Given, Missing, Chipped Multiple rotten /Broken teeth :   Pulmonary COPD,  COPD inhaler, Current Smoker and Patient abstained from smoking.,  esophageal cancer, h/o tracheostomy   Pulmonary exam normal breath sounds clear to auscultation       Cardiovascular Exercise Tolerance: Good hypertension, Pt. on medications and Pt. on home beta blockers + CAD, + Cardiac Stents and + Peripheral Vascular Disease  Normal cardiovascular exam Rhythm:Regular Rate:Normal  Denies recent CP or ever using NTG H/o lower stent -kept on anticoagulation   Neuro/Psych PSYCHIATRIC DISORDERS Anxiety negative neurological ROS  negative psych ROS   GI/Hepatic GERD  Medicated and Controlled,(+)     substance abuse  alcohol use, H/o Esophageal Ca s/p resection with stomach pull through, reports complications after  Here for EGD/poss dil    Endo/Other  Hypothyroidism   Renal/GU Renal disease (hyponatremia - 127)  negative genitourinary   Musculoskeletal  (+) Arthritis  (chronic back pain),   Abdominal   Peds negative pediatric ROS (+)  Hematology   Anesthesia Other Findings   Reproductive/Obstetrics negative OB ROS                            Anesthesia Physical  Anesthesia Plan  ASA: 3  Anesthesia Plan: General   Post-op Pain Management:    Induction:  Intravenous  PONV Risk Score and Plan: 1 and TIVA  Airway Management Planned: Nasal Cannula and Natural Airway  Additional Equipment:   Intra-op Plan:   Post-operative Plan:   Informed Consent: I have reviewed the patients History and Physical, chart, labs and discussed the procedure including the risks, benefits and alternatives for the proposed anesthesia with the patient or authorized representative who has indicated his/her understanding and acceptance.     Dental advisory given  Plan Discussed with: CRNA and Surgeon  Anesthesia Plan Comments:         Anesthesia Quick Evaluation

## 2021-03-24 ENCOUNTER — Ambulatory Visit (INDEPENDENT_AMBULATORY_CARE_PROVIDER_SITE_OTHER): Payer: Medicare Other | Admitting: Gastroenterology

## 2021-03-24 ENCOUNTER — Encounter (HOSPITAL_COMMUNITY): Payer: Self-pay | Admitting: Internal Medicine

## 2021-03-24 ENCOUNTER — Other Ambulatory Visit: Payer: Self-pay

## 2021-03-24 ENCOUNTER — Other Ambulatory Visit (INDEPENDENT_AMBULATORY_CARE_PROVIDER_SITE_OTHER): Payer: Self-pay

## 2021-03-24 ENCOUNTER — Encounter (INDEPENDENT_AMBULATORY_CARE_PROVIDER_SITE_OTHER): Payer: Self-pay

## 2021-03-24 VITALS — BP 97/61 | HR 80 | Temp 98.1°F | Ht 67.0 in | Wt 121.7 lb

## 2021-03-24 DIAGNOSIS — F17210 Nicotine dependence, cigarettes, uncomplicated: Secondary | ICD-10-CM

## 2021-03-24 DIAGNOSIS — K222 Esophageal obstruction: Secondary | ICD-10-CM | POA: Diagnosis not present

## 2021-03-24 DIAGNOSIS — F1010 Alcohol abuse, uncomplicated: Secondary | ICD-10-CM

## 2021-03-24 MED ORDER — OMEPRAZOLE 40 MG PO CPDR: 40.0000 mg | DELAYED_RELEASE_CAPSULE | Freq: Every day | ORAL | 3 refills | Status: DC

## 2021-03-24 NOTE — Progress Notes (Signed)
Jimmy Tucker, M.D. Gastroenterology & Hepatology [facility] [address] [place], [place] [address]  Primary Care Physician: [name], MD [address] [place] [address]  I will communicate my assessment and recommendations to the referring MD via EMR.  Problems: Recurrent esophagogastric anastomotic stricture History of esophageal adenocarcinoma s/p esophagectomy Candida esophagitis Chronic alcohol abuse  History of Present Illness: Jimmy Tucker is a 60 y.o. male PMH esophageal adenocarcinoma s/p esophagectomy on 6063 at [facility] complicated by postoperative leak and recurrent strictures, COPD, HLD , HTN, chronic smoking and alcohol use, anxiety and PAD, who presents for follow up of esophageal stricture.  The patient was last seen on 09/01/2020. At that time, the patient was scheduled to undergo an EGD for possible dilation as he was presenting worsening dysphagia, he was also advised to avoid alcohol and tobacco. The patient had blood testing ordered to evaluate for possible biochemical evidence of cirrhosis but he did not perform this testing.  In fact he did not proceed with undergoing with the esophagogastroduodenospy as his wife was diagnosed with malignancy and he postponed the management of his esophageal symptoms.  However, the patient was hospitalized on 03/05/2021 as he presented severe worsening dysphagia that did not allow him to take any of his medications.  Due to this, the gastroenterology team was consulted and he was offered to undergo an EGD but he was able to tolerate some liquids and was discharged home.  During this hospitalization, he had blood testing performed that showed an INR of 1.0, CBC with platelet count of 131, rest of cell lines within normal limits, CMP with albumin of 3.0, total bilirubin 1.0, AST of 38, ALT of 13 and normal renal function.  The patient was set up for an outpatient EGD which he  underwent on 03/10/2021.  This procedure was performed by Dr. Laural Golden.  The patient was found to have white flakes in the proximal esophagus consistent with Candida esophagitis (he was given a prescription for nystatin).  Was also found to have an intrinsic severe stenosis at 33 cm from the incisors which measured 7 mm x 1 cm in length.  The stenosis was transversed by downsizing the scope and it showed presence of mild distal mucosal disruption without major alteration.  Patient states that he is feeling well.  He reports that he feels that after his dilation he has felt better than before but he still has some mild dysphagia occasionally. Patient was 48.6 kg on 03/06/2021 and now is 55 Kg. He is eating more and has tolerated them adequately. He ate yesterday mashed potatoes and breaded beef, also eggs and vegetables. No episodes of dysphagia, no heartburn or odynophagia. He states he feels some soreness in his throat when he talks for long time.  He is able to take his medications.  He has been taking Nystatin compliantly but has not finished his course yet.  He is not taking any PPI.  The patient denies having any nausea, vomiting, fever, chills, hematochezia, melena, hematemesis, abdominal distention, abdominal pain, diarrhea, jaundice, pruritus.  Patient is still smoking and is not interested yet in quitting.  Last EGD: As above Last Colonoscopy: 2013 - diverticulosis and hemorrhoids  Past Medical History: Past Medical History:  Diagnosis Date   Abnormal nuclear stress test    Anxiety    Cancer ([place])    Carotid artery disease (HCC)    Chronic back pain    Chronic lower back pain    "  L4-5"   COPD (chronic obstructive pulmonary disease) (HCC)    Esophageal cancer (HCC)    Heavy cigarette smoker (20-39 per day)    Unmotivated   Hyperlipidemia LDL goal <70    Intolerant to statins   Hypertension    Lumbar disc disease    PAD (peripheral artery disease) ([place]) 10/30/2012   a) 2011 LEA  Dopplers: Left ATA occlusion; b) 10/2013: LEA Dopplers - L Common Iliac & CFA occlusion - w/ short reconstitution at the branch point of the external and internal iliac, the external iliac is occluded through the common femoral and proximal SFA; reconstitutes in the proximal SFA. Two-vessel runoff beyond.; c) s/p LCIA Stent & CFA EA w/ patch angioplasty --> f/u doppler 12/01/2013 patent   Pollen allergies    Presence of stent in artery     Past Surgical History: Past Surgical History:  Procedure Laterality Date   BACK SURGERY  02/2008   CARDIAC CATHETERIZATION N/A 02/15/2015   Procedure: Left Heart Cath and Coronary Angiography;  Surgeon: Lorretta Harp, MD;  Location: [place] [facility];  Service: Cardiovascular;  Laterality: N/A;   COLONOSCOPY  07/24/2011   Procedure: COLONOSCOPY;  Surgeon: Daneil Dolin, MD;  [facility];  Service: Endoscopy;  Laterality: N/A;  8:15   CORONARY STENT PLACEMENT     ENDARTERECTOMY FEMORAL Left 11/11/2013   Procedure: Left Femoral Endarterectomy with Patch Angioplasty ;  Surgeon: Angelia Mould, MD;  Location: [place];  Service: Vascular;  Laterality: Left;   ESOPHAGEAL DILATION N/A 08/29/2019   Procedure: ESOPHAGEAL DILATION;  Surgeon: Rogene Houston, MD;  [facility];  Service: Endoscopy;  Laterality: N/A;   ESOPHAGEAL DILATION N/A 03/10/2021   Procedure: ESOPHAGEAL DILATION;  Surgeon: Rogene Houston, MD;  [facility];  Service: Endoscopy;  Laterality: N/A;   ESOPHAGOGASTRODUODENOSCOPY N/A 10/09/2014   Procedure: ESOPHAGOGASTRODUODENOSCOPY (EGD);  Surgeon: Rogene Houston, MD;  [facility];  Service: Endoscopy;  Laterality: N/A;  230 - moved to 9:55 - Ann notified pt to arrive at 8:55   ESOPHAGOGASTRODUODENOSCOPY (EGD) WITH PROPOFOL N/A 07/21/2019   Procedure: ESOPHAGOGASTRODUODENOSCOPY (EGD) WITH PROPOFOL;  Surgeon: Rogene Houston, MD;  [facility];  Service: Endoscopy;  Laterality: N/A;  11:55    ESOPHAGOGASTRODUODENOSCOPY (EGD) WITH PROPOFOL N/A 08/29/2019   Procedure: ESOPHAGOGASTRODUODENOSCOPY (EGD) WITH PROPOFOL;  Surgeon: Rogene Houston, MD;  [facility];  Service: Endoscopy;  Laterality: N/A;  730   ESOPHAGOGASTRODUODENOSCOPY (EGD) WITH PROPOFOL N/A 03/10/2021   Procedure: ESOPHAGOGASTRODUODENOSCOPY (EGD) WITH PROPOFOL;  Surgeon: Rogene Houston, MD;  [facility];  Service: Endoscopy;  Laterality: N/A;  1:20 / Following Dr Gala Romney in Room 3 at the end Per Dr Laural Golden - per office ok'd by Dr. Rolanda Lundborg ARTERY STENT Left 11/03/2013   Dr. Judithann Sauger   IR FLUORO GUIDED NEEDLE [facility] ASPIRATION/INJECTION LOC  08/03/2018   LOWER EXTREMITY ANGIOGRAM N/A 11/03/2013   Procedure: LOWER EXTREMITY ANGIOGRAM;  Surgeon: Lorretta Harp, MD;  Location: [facility] CATH LAB;  Service: Cardiovascular;  Laterality: N/A;   LUMBAR MICRODISCECTOMY Left 02/2008   L4-5   NM MYOCAR PERF WALL MOTION  07/2009   persantine - normal perfusion   SURGERY SCROTAL / TESTICULAR Left    cyst excision   THORACIC ESOPHAGUS REPLACEMENT      Family History: Family History  Problem Relation Age of Onset   Heart attack Father 74  Cardiac arrest   Sudden death Father    Stroke Brother 27   Stroke Mother    Colon cancer Neg Hx     Social History: Social History   Tobacco Use  Smoking Status Every Day   Packs/day: 0.50   Years: 40.00   Pack years: 20.00   Types: Cigarettes   Last attempt to quit: 11/02/2013   Years since quitting: 7.3  Smokeless Tobacco Never   Social History   Substance and Sexual Activity  Alcohol Use Yes   Alcohol/week: 21.0 standard drinks   Types: 21 Cans of beer per week   Comment: 6-8 beers per week   Social History   Substance and Sexual Activity  Drug Use No    Allergies: Allergies  Allergen Reactions   Drug [Tape]     PLEASE USE COBAN ,,,, ADHESIVE TAPE TEARS SKIN    Glycopyrrolate Other (See Comments)    Intolerance   Warfarin And Related Other (See  Comments)    Excessive bleeding    Medications: Current Outpatient Medications  Medication Sig Dispense Refill   albuterol (VENTOLIN HFA) 108 (90 Base) MCG/ACT inhaler Inhale 2 puffs into the lungs every 4 (four) hours as needed for wheezing or shortness of breath.      ALPRAZolam (XANAX) 1 MG tablet Take 1 mg by mouth at bedtime as needed for anxiety.     amphetamine-dextroamphetamine (ADDERALL) 5 MG tablet Take 1 tablet by mouth 2 (two) times daily.     aspirin EC 81 MG tablet Take 1 tablet (81 mg total) by mouth daily. 30 tablet 11   Cholecalciferol (VITAMIN D3) 50 MCG (2000 UT) CHEW Chew 4,000 Units by mouth daily.     diphenoxylate-atropine (LOMOTIL) 2.5-0.025 MG tablet Take 1 tablet by mouth every 6 (six) hours as needed for diarrhea or loose stools.      levothyroxine (SYNTHROID) 50 MCG tablet Take 50 mcg by mouth daily before breakfast.      nystatin (MYCOSTATIN) 100000 UNIT/ML suspension Take 5 mLs (500,000 Units total) by mouth 4 (four) times daily. 240 mL 1   Oxycodone HCl 20 MG TABS Take 1 tablet (20 mg total) by mouth every 4 (four) hours as needed (pain). 30 tablet 0   Tiotropium Bromide-Olodaterol 2.5-2.5 MCG/ACT AERS Inhale 2 puffs into the lungs daily.     folic acid (FOLVITE) 1 MG tablet Take 1 tablet (1 mg total) by mouth daily. (Patient not taking: Reported on 03/24/2021) 30 tablet 5   No current facility-administered medications for this visit.    Review of Systems: GENERAL: negative for malaise, night sweats HEENT: No changes in hearing or vision, no nose bleeds or other nasal problems. NECK: Negative for lumps, goiter, pain and significant neck swelling RESPIRATORY: Negative for cough, wheezing CARDIOVASCULAR: Negative for chest pain, leg swelling, palpitations, orthopnea GI: SEE HPI MUSCULOSKELETAL: Negative for joint pain or swelling, back pain, and muscle pain. SKIN: Negative for lesions, rash PSYCH: Negative for sleep disturbance, mood disorder and recent  psychosocial stressors. HEMATOLOGY Negative for prolonged bleeding, bruising easily, and swollen nodes. ENDOCRINE: Negative for cold or heat intolerance, polyuria, polydipsia and goiter. NEURO: negative for tremor, gait imbalance, syncope and seizures. The remainder of the review of systems is noncontributory.   Physical Exam: BP 97/61 (BP Location: Left Arm, Patient Position: Sitting, Cuff Size: Small)   Pulse 80   Temp 98.1 F (36.7 C) (Oral)   Ht 5\' 7"  (1.702 m)   Wt 121 lb 11.2 oz (55.2 kg)  BMI 19.06 kg/m  GENERAL: The patient is AO x3, in no acute distress. Frail. Uses a cane. HEENT: Head is normocephalic and atraumatic. EOMI are intact. Mouth is well hydrated and without lesions. NECK: Supple. No masses LUNGS: Clear to auscultation. No presence of rhonchi/wheezing/rales. Adequate chest expansion HEART: RRR, normal s1 and s2. ABDOMEN: Soft, nontender, no guarding, no peritoneal signs, and nondistended. BS +. No masses. EXTREMITIES: Without any cyanosis, clubbing, rash, lesions or edema. NEUROLOGIC: AOx3, no focal motor deficit. SKIN: no jaundice, no rashes  Imaging/Labs: as above  I personally reviewed and interpreted the available labs, imaging and endoscopic files.  Impression and Plan: Jimmy Tucker is a 60 y.o. male PMH esophageal adenocarcinoma s/p esophagectomy on 4462 at [facility] complicated by postoperative leak and recurrent strictures, COPD, HLD , HTN, chronic smoking and alcohol use, anxiety and PAD, who presents for follow up of esophageal stricture.  The patient has presented recurrent symptoms of dysphagia in the past that required endoscopic evaluation, where he was found to have recurrent strictures at the gastroesophageal anastomosis.  He recently required a repeat endoscopic evaluation where he was dilated with the scope successfully as he has presented improvement of his symptoms.  At this point, given the presence of mild dysphagia we will repeat an EGD 4 weeks  after his last endoscopic evaluation.  The patient understood and agreed with this plan.  I will also start him on omeprazole 40 mg every day which he should keep taking indefinitely as this may slow down the recurrence of his symptoms.  I emphasized to the patient importance of avoiding smoking as this will also increase the reflux events and worsening scarring.  Notably, the patient has a history of chronic alcohol intake, he should quit alcohol.  He had evidence of some AST to ALT inversion.  We will consider performing a liver elastography for him to evaluate for advanced fibrosis.  We will obtain updated MELD labs the day of his endoscopy.  - Schedule EGD with dilation - Start omeprazole 40 mg qday - swallow granules from capsule - Will check MELD labs on the day of his repeat EGD - Alcohol and smoking cessation  All questions were answered.      Jimmy Quale, MD Gastroenterology and Hepatology [facility]

## 2021-03-24 NOTE — H&P (View-Only) (Signed)
Maylon Peppers, M.D. Gastroenterology & Hepatology [facility] [address] [place], [place] [address]  Primary Care Physician: [name], MD [address] [place] [address]  I will communicate my assessment and recommendations to the referring MD via EMR.  Problems: Recurrent esophagogastric anastomotic stricture History of esophageal adenocarcinoma s/p esophagectomy Candida esophagitis Chronic alcohol abuse  History of Present Illness: Pearson Picou [place] is a 60 y.o. male PMH esophageal adenocarcinoma s/p esophagectomy on 3614 at [facility] complicated by postoperative leak and recurrent strictures, COPD, HLD , HTN, chronic smoking and alcohol use, anxiety and PAD, who presents for follow up of esophageal stricture.  The patient was last seen on 09/01/2020. At that time, the patient was scheduled to undergo an EGD for possible dilation as he was presenting worsening dysphagia, he was also advised to avoid alcohol and tobacco. The patient had blood testing ordered to evaluate for possible biochemical evidence of cirrhosis but he did not perform this testing.  In fact he did not proceed with undergoing with the esophagogastroduodenospy as his wife was diagnosed with malignancy and he postponed the management of his esophageal symptoms.  However, the patient was hospitalized on 03/05/2021 as he presented severe worsening dysphagia that did not allow him to take any of his medications.  Due to this, the gastroenterology team was consulted and he was offered to undergo an EGD but he was able to tolerate some liquids and was discharged home.  During this hospitalization, he had blood testing performed that showed an INR of 1.0, CBC with platelet count of 131, rest of cell lines within normal limits, CMP with albumin of 3.0, total bilirubin 1.0, AST of 38, ALT of 13 and normal renal function.  The patient was set up for an outpatient EGD which he  underwent on 03/10/2021.  This procedure was performed by Dr. Laural Golden.  The patient was found to have white flakes in the proximal esophagus consistent with Candida esophagitis (he was given a prescription for nystatin).  Was also found to have an intrinsic severe stenosis at 33 cm from the incisors which measured 7 mm x 1 cm in length.  The stenosis was transversed by downsizing the scope and it showed presence of mild distal mucosal disruption without major alteration.  Patient states that he is feeling well.  He reports that he feels that after his dilation he has felt better than before but he still has some mild dysphagia occasionally. Patient was 48.6 kg on 03/06/2021 and now is 55 Kg. He is eating more and has tolerated them adequately. He ate yesterday mashed potatoes and breaded beef, also eggs and vegetables. No episodes of dysphagia, no heartburn or odynophagia. He states he feels some soreness in his throat when he talks for long time.  He is able to take his medications.  He has been taking Nystatin compliantly but has not finished his course yet.  He is not taking any PPI.  The patient denies having any nausea, vomiting, fever, chills, hematochezia, melena, hematemesis, abdominal distention, abdominal pain, diarrhea, jaundice, pruritus.  Patient is still smoking and is not interested yet in quitting.  Last EGD: As above Last Colonoscopy: 2013 - diverticulosis and hemorrhoids  Past Medical History: Past Medical History:  Diagnosis Date   Abnormal nuclear stress test    Anxiety    Cancer ([place])    Carotid artery disease (HCC)    Chronic back pain    Chronic lower back pain    "  L4-5"   COPD (chronic obstructive pulmonary disease) (HCC)    Esophageal cancer (HCC)    Heavy cigarette smoker (20-39 per day)    Unmotivated   Hyperlipidemia LDL goal <70    Intolerant to statins   Hypertension    Lumbar disc disease    PAD (peripheral artery disease) ([place]) 10/30/2012   a) 2011 LEA  Dopplers: Left ATA occlusion; b) 10/2013: LEA Dopplers - L Common Iliac & CFA occlusion - w/ short reconstitution at the branch point of the external and internal iliac, the external iliac is occluded through the common femoral and proximal SFA; reconstitutes in the proximal SFA. Two-vessel runoff beyond.; c) s/p LCIA Stent & CFA EA w/ patch angioplasty --> f/u doppler 12/01/2013 patent   Pollen allergies    Presence of stent in artery     Past Surgical History: Past Surgical History:  Procedure Laterality Date   BACK SURGERY  02/2008   CARDIAC CATHETERIZATION N/A 02/15/2015   Procedure: Left Heart Cath and Coronary Angiography;  Surgeon: Lorretta Harp, MD;  Location: [place] [facility];  Service: Cardiovascular;  Laterality: N/A;   COLONOSCOPY  07/24/2011   Procedure: COLONOSCOPY;  Surgeon: Daneil Dolin, MD;  [facility];  Service: Endoscopy;  Laterality: N/A;  8:15   CORONARY STENT PLACEMENT     ENDARTERECTOMY FEMORAL Left 11/11/2013   Procedure: Left Femoral Endarterectomy with Patch Angioplasty ;  Surgeon: Angelia Mould, MD;  Location: [place];  Service: Vascular;  Laterality: Left;   ESOPHAGEAL DILATION N/A 08/29/2019   Procedure: ESOPHAGEAL DILATION;  Surgeon: Rogene Houston, MD;  [facility];  Service: Endoscopy;  Laterality: N/A;   ESOPHAGEAL DILATION N/A 03/10/2021   Procedure: ESOPHAGEAL DILATION;  Surgeon: Rogene Houston, MD;  [facility];  Service: Endoscopy;  Laterality: N/A;   ESOPHAGOGASTRODUODENOSCOPY N/A 10/09/2014   Procedure: ESOPHAGOGASTRODUODENOSCOPY (EGD);  Surgeon: Rogene Houston, MD;  [facility];  Service: Endoscopy;  Laterality: N/A;  230 - moved to 9:55 - Ann notified pt to arrive at 8:55   ESOPHAGOGASTRODUODENOSCOPY (EGD) WITH PROPOFOL N/A 07/21/2019   Procedure: ESOPHAGOGASTRODUODENOSCOPY (EGD) WITH PROPOFOL;  Surgeon: Rogene Houston, MD;  [facility];  Service: Endoscopy;  Laterality: N/A;  11:55    ESOPHAGOGASTRODUODENOSCOPY (EGD) WITH PROPOFOL N/A 08/29/2019   Procedure: ESOPHAGOGASTRODUODENOSCOPY (EGD) WITH PROPOFOL;  Surgeon: Rogene Houston, MD;  [facility];  Service: Endoscopy;  Laterality: N/A;  730   ESOPHAGOGASTRODUODENOSCOPY (EGD) WITH PROPOFOL N/A 03/10/2021   Procedure: ESOPHAGOGASTRODUODENOSCOPY (EGD) WITH PROPOFOL;  Surgeon: Rogene Houston, MD;  [facility];  Service: Endoscopy;  Laterality: N/A;  1:20 / Following Dr Gala Romney in Room 3 at the end Per Dr Laural Golden - per office ok'd by Dr. Rolanda Lundborg ARTERY STENT Left 11/03/2013   Dr. Judithann Sauger   IR FLUORO GUIDED NEEDLE PLC ASPIRATION/INJECTION LOC  08/03/2018   LOWER EXTREMITY ANGIOGRAM N/A 11/03/2013   Procedure: LOWER EXTREMITY ANGIOGRAM;  Surgeon: Lorretta Harp, MD;  Location: [facility];  Service: Cardiovascular;  Laterality: N/A;   LUMBAR MICRODISCECTOMY Left 02/2008   L4-5   NM MYOCAR PERF WALL MOTION  07/2009   persantine - normal perfusion   SURGERY SCROTAL / TESTICULAR Left    cyst excision   THORACIC ESOPHAGUS REPLACEMENT      Family History: Family History  Problem Relation Age of Onset   Heart attack Father 49  Cardiac arrest   Sudden death Father    Stroke Brother 39   Stroke Mother    Colon cancer Neg Hx     Social History: Social History   Tobacco Use  Smoking Status Every Day   Packs/day: 0.50   Years: 40.00   Pack years: 20.00   Types: Cigarettes   Last attempt to quit: 11/02/2013   Years since quitting: 7.3  Smokeless Tobacco Never   Social History   Substance and Sexual Activity  Alcohol Use Yes   Alcohol/week: 21.0 standard drinks   Types: 21 Cans of beer per week   Comment: 6-8 beers per week   Social History   Substance and Sexual Activity  Drug Use No    Allergies: Allergies  Allergen Reactions   Drug [Tape]     PLEASE USE COBAN ,,,, ADHESIVE TAPE TEARS SKIN    Glycopyrrolate Other (See Comments)    Intolerance   Warfarin And Related Other (See  Comments)    Excessive bleeding    Medications: Current Outpatient Medications  Medication Sig Dispense Refill   albuterol (VENTOLIN HFA) 108 (90 Base) MCG/ACT inhaler Inhale 2 puffs into the lungs every 4 (four) hours as needed for wheezing or shortness of breath.      ALPRAZolam (XANAX) 1 MG tablet Take 1 mg by mouth at bedtime as needed for anxiety.     amphetamine-dextroamphetamine (ADDERALL) 5 MG tablet Take 1 tablet by mouth 2 (two) times daily.     aspirin EC 81 MG tablet Take 1 tablet (81 mg total) by mouth daily. 30 tablet 11   Cholecalciferol (VITAMIN D3) 50 MCG (2000 UT) CHEW Chew 4,000 Units by mouth daily.     diphenoxylate-atropine (LOMOTIL) 2.5-0.025 MG tablet Take 1 tablet by mouth every 6 (six) hours as needed for diarrhea or loose stools.      levothyroxine (SYNTHROID) 50 MCG tablet Take 50 mcg by mouth daily before breakfast.      nystatin (MYCOSTATIN) 100000 UNIT/ML suspension Take 5 mLs (500,000 Units total) by mouth 4 (four) times daily. 240 mL 1   Oxycodone HCl 20 MG TABS Take 1 tablet (20 mg total) by mouth every 4 (four) hours as needed (pain). 30 tablet 0   Tiotropium Bromide-Olodaterol 2.5-2.5 MCG/ACT AERS Inhale 2 puffs into the lungs daily.     folic acid (FOLVITE) 1 MG tablet Take 1 tablet (1 mg total) by mouth daily. (Patient not taking: Reported on 03/24/2021) 30 tablet 5   No current facility-administered medications for this visit.    Review of Systems: GENERAL: negative for malaise, night sweats HEENT: No changes in hearing or vision, no nose bleeds or other nasal problems. NECK: Negative for lumps, goiter, pain and significant neck swelling RESPIRATORY: Negative for cough, wheezing CARDIOVASCULAR: Negative for chest pain, leg swelling, palpitations, orthopnea GI: SEE HPI MUSCULOSKELETAL: Negative for joint pain or swelling, back pain, and muscle pain. SKIN: Negative for lesions, rash PSYCH: Negative for sleep disturbance, mood disorder and recent  psychosocial stressors. HEMATOLOGY Negative for prolonged bleeding, bruising easily, and swollen nodes. ENDOCRINE: Negative for cold or heat intolerance, polyuria, polydipsia and goiter. NEURO: negative for tremor, gait imbalance, syncope and seizures. The remainder of the review of systems is noncontributory.   Physical Exam: BP 97/61 (BP Location: Left Arm, Patient Position: Sitting, Cuff Size: Small)   Pulse 80   Temp 98.1 F (36.7 C) (Oral)   Ht 5\' 7"  (1.702 m)   Wt 121 lb 11.2 oz (55.2 kg)  BMI 19.06 kg/m  GENERAL: The patient is AO x3, in no acute distress. Frail. Uses a cane. HEENT: Head is normocephalic and atraumatic. EOMI are intact. Mouth is well hydrated and without lesions. NECK: Supple. No masses LUNGS: Clear to auscultation. No presence of rhonchi/wheezing/rales. Adequate chest expansion HEART: RRR, normal s1 and s2. ABDOMEN: Soft, nontender, no guarding, no peritoneal signs, and nondistended. BS +. No masses. EXTREMITIES: Without any cyanosis, clubbing, rash, lesions or edema. NEUROLOGIC: AOx3, no focal motor deficit. SKIN: no jaundice, no rashes  Imaging/Labs: as above  I personally reviewed and interpreted the available labs, imaging and endoscopic files.  Impression and Plan: Yurem T [place] is a 60 y.o. male PMH esophageal adenocarcinoma s/p esophagectomy on 4680 at [facility] complicated by postoperative leak and recurrent strictures, COPD, HLD , HTN, chronic smoking and alcohol use, anxiety and PAD, who presents for follow up of esophageal stricture.  The patient has presented recurrent symptoms of dysphagia in the past that required endoscopic evaluation, where he was found to have recurrent strictures at the gastroesophageal anastomosis.  He recently required a repeat endoscopic evaluation where he was dilated with the scope successfully as he has presented improvement of his symptoms.  At this point, given the presence of mild dysphagia we will repeat an EGD 4 weeks  after his last endoscopic evaluation.  The patient understood and agreed with this plan.  I will also start him on omeprazole 40 mg every day which he should keep taking indefinitely as this may slow down the recurrence of his symptoms.  I emphasized to the patient importance of avoiding smoking as this will also increase the reflux events and worsening scarring.  Notably, the patient has a history of chronic alcohol intake, he should quit alcohol.  He had evidence of some AST to ALT inversion.  We will consider performing a liver elastography for him to evaluate for advanced fibrosis.  We will obtain updated MELD labs the day of his endoscopy.  - Schedule EGD with dilation - Start omeprazole 40 mg qday - swallow granules from capsule - Will check MELD labs on the day of his repeat EGD - Alcohol and smoking cessation  All questions were answered.      Harvel Quale, MD Gastroenterology and Hepatology [facility]

## 2021-03-24 NOTE — Patient Instructions (Signed)
Schedule EGD with dilation Start omeprazole 40 mg qday - swallow granules from capsule

## 2021-03-25 ENCOUNTER — Encounter (INDEPENDENT_AMBULATORY_CARE_PROVIDER_SITE_OTHER): Payer: Self-pay

## 2021-03-25 ENCOUNTER — Other Ambulatory Visit (INDEPENDENT_AMBULATORY_CARE_PROVIDER_SITE_OTHER): Payer: Self-pay

## 2021-03-30 DIAGNOSIS — C159 Malignant neoplasm of esophagus, unspecified: Secondary | ICD-10-CM | POA: Diagnosis not present

## 2021-03-30 DIAGNOSIS — G894 Chronic pain syndrome: Secondary | ICD-10-CM | POA: Diagnosis not present

## 2021-04-06 NOTE — Patient Instructions (Signed)
Jimmy Tucker  04/06/2021     @PREFPERIOPPHARMACY @   Your procedure is scheduled on  04/12/2021.   Report to [name] at  0700 A.M.   Call this number if you have problems the morning of surgery:  [phone]   Remember:  Follow the diet and prep instructions given to you by the office.    Take these medicines the morning of surgery with A SIP OF WATER       adderall, levothyroxine, omeprazole, propranolol.     Do not wear jewelry, make-up or nail polish.  Do not wear lotions, powders, or perfumes, or deodorant.  Do not shave 48 hours prior to surgery.  Men may shave face and neck.  Do not bring valuables to the hospital.  [facility] is not responsible for any belongings or valuables.  Contacts, dentures or bridgework may not be worn into surgery.  Leave your suitcase in the car.  After surgery it may be brought to your room.  For patients admitted to the hospital, discharge time will be determined by your treatment team.  Patients discharged the day of surgery will not be allowed to drive home and must have someone with them for 24 hours.    Special instructions:  DO NOT smoke tobacco or vape for 24 hours before your procedure.  Please read over the following fact sheets that you were given. Anesthesia Post-op Instructions and Care and Recovery After Surgery      Esophageal Dilatation Esophageal dilatation, also called esophageal dilation, is a procedure to widen or open a blocked or narrowed part of the esophagus. The esophagus is the part of the body that moves food and liquid from the mouth to the stomach. You may need this procedure if: You have a buildup of scar tissue in your esophagus that makes it difficult, painful, or impossible to swallow. This can be caused by gastroesophageal reflux disease (GERD). You have cancer of the esophagus. There is a problem with how food moves through your esophagus. In some cases, you may need this procedure  repeated at a later time to dilate the esophagus gradually. Tell a health care provider about: Any allergies you have. All medicines you are taking, including vitamins, herbs, eye drops, creams, and over-the-counter medicines. Any problems you or family members have had with anesthetic medicines. Any blood disorders you have. Any surgeries you have had. Any medical conditions you have. Any antibiotic medicines you are required to take before dental procedures. Whether you are pregnant or may be pregnant. What are the risks? Generally, this is a safe procedure. However, problems may occur, including: Bleeding due to a tear in the lining of the esophagus. A hole, or perforation, in the esophagus. What happens before the procedure? Ask your health care provider about: Changing or stopping your regular medicines. This is especially important if you are taking diabetes medicines or blood thinners. Taking medicines such as aspirin and ibuprofen. These medicines can thin your blood. Do not take these medicines unless your health care provider tells you to take them. Taking over-the-counter medicines, vitamins, herbs, and supplements. Follow instructions from your health care provider about eating or drinking restrictions. Plan to have a responsible adult take you home from the hospital or clinic. Plan to have a responsible adult care for you for the time you are told after you leave the hospital or clinic. This is important. What happens during the procedure? You may be given  a medicine to help you relax (sedative). A numbing medicine may be sprayed into the back of your throat, or you may gargle the medicine. Your health care provider may perform the dilatation using various surgical instruments, such as: Simple dilators. This instrument is carefully placed in the esophagus to stretch it. Guided wire bougies. This involves using an endoscope to insert a wire into the esophagus. A dilator is  passed over this wire to enlarge the esophagus. Then the wire is removed. Balloon dilators. An endoscope with a small balloon is inserted into the esophagus. The balloon is inflated to stretch the esophagus and open it up. The procedure may vary among health care providers and hospitals. What can I expect after the procedure? Your blood pressure, heart rate, breathing rate, and blood oxygen level will be monitored until you leave the hospital or clinic. Your throat may feel slightly sore and numb. This will get better over time. You will not be allowed to eat or drink until your throat is no longer numb. When you are able to drink, urinate, and sit on the edge of the bed without nausea or dizziness, you may be able to return home. Follow these instructions at home: Take over-the-counter and prescription medicines only as told by your health care provider. If you were given a sedative during the procedure, it can affect you for several hours. Do not drive or operate machinery until your health care provider says that it is safe. Plan to have a responsible adult care for you for the time you are told. This is important. Follow instructions from your health care provider about any eating or drinking restrictions. Do not use any products that contain nicotine or tobacco, such as cigarettes, e-cigarettes, and chewing tobacco. If you need help quitting, ask your health care provider. Keep all follow-up visits. This is important. Contact a health care provider if: You have a fever. You have pain that is not relieved by medicine. Get help right away if: You have chest pain. You have trouble breathing. You have trouble swallowing. You vomit blood. You have black, tarry, or bloody stools. These symptoms may represent a serious problem that is an emergency. Do not wait to see if the symptoms will go away. Get medical help right away. Call your local emergency services (911 in the [place] Do not drive  yourself to the hospital. Summary Esophageal dilatation, also called esophageal dilation, is a procedure to widen or open a blocked or narrowed part of the esophagus. Plan to have a responsible adult take you home from the hospital or clinic. For this procedure, a numbing medicine may be sprayed into the back of your throat, or you may gargle the medicine. Do not drive or operate machinery until your health care provider says that it is safe. This information is not intended to replace advice given to you by your health care provider. Make sure you discuss any questions you have with your health care provider. Document Revised: 10/29/2019 Document Reviewed: 10/29/2019 Elsevier Patient Education  2022 [facility], Adult, Care After This sheet gives you information about how to care for yourself after your procedure. Your health care provider may also give you more specific instructions. If you have problems or questions, contact your health care provider. What can I expect after the procedure? After the procedure, it is common to have: A sore throat. Mild stomach pain or discomfort. Bloating. Nausea. Follow these instructions at home:  Follow instructions from your health  care provider about what to eat or drink after your procedure. Return to your normal activities as told by your health care provider. Ask your health care provider what activities are safe for you. Take over-the-counter and prescription medicines only as told by your health care provider. If you were given a sedative during the procedure, it can affect you for several hours. Do not drive or operate machinery until your health care provider says that it is safe. Keep all follow-up visits as told by your health care provider. This is important. Contact a health care provider if you have: A sore throat that lasts longer than one day. Trouble swallowing. Get help right away if: You vomit blood or your vomit  looks like coffee grounds. You have: A fever. Bloody, black, or tarry stools. A severe sore throat or you cannot swallow. Difficulty breathing. Severe pain in your chest or abdomen. Summary After the procedure, it is common to have a sore throat, mild stomach discomfort, bloating, and nausea. If you were given a sedative during the procedure, it can affect you for several hours. Do not drive or operate machinery until your health care provider says that it is safe. Follow instructions from your health care provider about what to eat or drink after your procedure. Return to your normal activities as told by your health care provider. This information is not intended to replace advice given to you by your health care provider. Make sure you discuss any questions you have with your health care provider. Document Revised: 06/10/2019 Document Reviewed: 11/12/2017 Elsevier Patient Education  2022 [place] After This sheet gives you information about how to care for yourself after your procedure. Your health care provider may also give you more specific instructions. If you have problems or questions, contact your health care provider. What can I expect after the procedure? After the procedure, it is common to have: Tiredness. Forgetfulness about what happened after the procedure. Impaired judgment for important decisions. Nausea or vomiting. Some difficulty with balance. Follow these instructions at home: For the time period you were told by your health care provider:   Rest as needed. Do not participate in activities where you could fall or become injured. Do not drive or use machinery. Do not drink alcohol. Do not take sleeping pills or medicines that cause drowsiness. Do not make important decisions or sign legal documents. Do not take care of children on your own. Eating and drinking Follow the diet that is recommended by your health care  provider. Drink enough fluid to keep your urine pale yellow. If you vomit: Drink water, juice, or soup when you can drink without vomiting. Make sure you have little or no nausea before eating solid foods. General instructions Have a responsible adult stay with you for the time you are told. It is important to have someone help care for you until you are awake and alert. Take over-the-counter and prescription medicines only as told by your health care provider. If you have sleep apnea, surgery and certain medicines can increase your risk for breathing problems. Follow instructions from your health care provider about wearing your sleep device: Anytime you are sleeping, including during daytime naps. While taking prescription pain medicines, sleeping medicines, or medicines that make you drowsy. Avoid smoking. Keep all follow-up visits as told by your health care provider. This is important. Contact a health care provider if: You keep feeling nauseous or you keep vomiting. You feel light-headed. You are  still sleepy or having trouble with balance after 24 hours. You develop a rash. You have a fever. You have redness or swelling around the IV site. Get help right away if: You have trouble breathing. You have new-onset confusion at home. Summary For several hours after your procedure, you may feel tired. You may also be forgetful and have poor judgment. Have a responsible adult stay with you for the time you are told. It is important to have someone help care for you until you are awake and alert. Rest as told. Do not drive or operate machinery. Do not drink alcohol or take sleeping pills. Get help right away if you have trouble breathing, or if you suddenly become confused. This information is not intended to replace advice given to you by your health care provider. Make sure you discuss any questions you have with your health care provider. Document Revised: 02/26/2020 Document Reviewed:  05/15/2019 Elsevier Patient Education  2022 [employer].

## 2021-04-08 ENCOUNTER — Other Ambulatory Visit: Payer: Self-pay

## 2021-04-08 ENCOUNTER — Encounter (HOSPITAL_COMMUNITY)
Admission: RE | Admit: 2021-04-08 | Discharge: 2021-04-08 | Disposition: A | Discharge status: Home / Self Care | Payer: Medicare Other | Source: Ambulatory Visit | Attending: Gastroenterology | Admitting: Gastroenterology

## 2021-04-12 ENCOUNTER — Encounter (HOSPITAL_COMMUNITY): Payer: Self-pay | Admitting: Gastroenterology

## 2021-04-12 ENCOUNTER — Encounter (HOSPITAL_COMMUNITY)
Admission: RE | Disposition: A | Discharge status: Home / Self Care | Payer: Self-pay | Source: Home / Self Care | Attending: Gastroenterology

## 2021-04-12 ENCOUNTER — Other Ambulatory Visit (INDEPENDENT_AMBULATORY_CARE_PROVIDER_SITE_OTHER): Payer: Self-pay

## 2021-04-12 ENCOUNTER — Ambulatory Visit (HOSPITAL_COMMUNITY)
Admission: RE | Admit: 2021-04-12 | Discharge: 2021-04-12 | Disposition: A | Discharge status: Home / Self Care | Payer: Medicare Other | Attending: Gastroenterology | Admitting: Gastroenterology

## 2021-04-12 ENCOUNTER — Ambulatory Visit (HOSPITAL_COMMUNITY): Payer: Medicare Other | Admitting: Anesthesiology

## 2021-04-12 ENCOUNTER — Other Ambulatory Visit: Payer: Self-pay

## 2021-04-12 DIAGNOSIS — Z7982 Long term (current) use of aspirin: Secondary | ICD-10-CM | POA: Diagnosis not present

## 2021-04-12 DIAGNOSIS — Z9889 Other specified postprocedural states: Secondary | ICD-10-CM | POA: Diagnosis not present

## 2021-04-12 DIAGNOSIS — I10 Essential (primary) hypertension: Secondary | ICD-10-CM | POA: Insufficient documentation

## 2021-04-12 DIAGNOSIS — K222 Esophageal obstruction: Secondary | ICD-10-CM

## 2021-04-12 DIAGNOSIS — Z8501 Personal history of malignant neoplasm of esophagus: Secondary | ICD-10-CM | POA: Diagnosis not present

## 2021-04-12 DIAGNOSIS — I739 Peripheral vascular disease, unspecified: Secondary | ICD-10-CM | POA: Diagnosis not present

## 2021-04-12 DIAGNOSIS — K2900 Acute gastritis without bleeding: Secondary | ICD-10-CM | POA: Diagnosis not present

## 2021-04-12 DIAGNOSIS — K259 Gastric ulcer, unspecified as acute or chronic, without hemorrhage or perforation: Secondary | ICD-10-CM | POA: Diagnosis not present

## 2021-04-12 DIAGNOSIS — Z79890 Hormone replacement therapy (postmenopausal): Secondary | ICD-10-CM | POA: Insufficient documentation

## 2021-04-12 DIAGNOSIS — J449 Chronic obstructive pulmonary disease, unspecified: Secondary | ICD-10-CM | POA: Insufficient documentation

## 2021-04-12 DIAGNOSIS — Z9049 Acquired absence of other specified parts of digestive tract: Secondary | ICD-10-CM | POA: Diagnosis not present

## 2021-04-12 DIAGNOSIS — K2950 Unspecified chronic gastritis without bleeding: Secondary | ICD-10-CM | POA: Diagnosis not present

## 2021-04-12 DIAGNOSIS — F17210 Nicotine dependence, cigarettes, uncomplicated: Secondary | ICD-10-CM | POA: Diagnosis not present

## 2021-04-12 DIAGNOSIS — K9189 Other postprocedural complications and disorders of digestive system: Secondary | ICD-10-CM

## 2021-04-12 DIAGNOSIS — F419 Anxiety disorder, unspecified: Secondary | ICD-10-CM | POA: Insufficient documentation

## 2021-04-12 DIAGNOSIS — B3781 Candidal esophagitis: Secondary | ICD-10-CM | POA: Insufficient documentation

## 2021-04-12 DIAGNOSIS — I2510 Atherosclerotic heart disease of native coronary artery without angina pectoris: Secondary | ICD-10-CM | POA: Diagnosis not present

## 2021-04-12 DIAGNOSIS — Z79899 Other long term (current) drug therapy: Secondary | ICD-10-CM | POA: Diagnosis not present

## 2021-04-12 DIAGNOSIS — F1010 Alcohol abuse, uncomplicated: Secondary | ICD-10-CM | POA: Insufficient documentation

## 2021-04-12 HISTORY — PX: ESOPHAGOGASTRODUODENOSCOPY (EGD) WITH PROPOFOL: SHX5813

## 2021-04-12 HISTORY — PX: BIOPSY: SHX5522

## 2021-04-12 SURGERY — ESOPHAGOGASTRODUODENOSCOPY (EGD) WITH PROPOFOL
Anesthesia: General

## 2021-04-12 MED ORDER — PHENYLEPHRINE HCL (PRESSORS) 10 MG/ML IV SOLN
INTRAVENOUS | Status: DC | PRN
  Administered 2021-04-12 (×2): 80 ug via INTRAVENOUS

## 2021-04-12 MED ORDER — LACTATED RINGERS IV SOLN
INTRAVENOUS | Status: DC | PRN
  Administered 2021-04-12: 1000 mL via INTRAVENOUS

## 2021-04-12 MED ORDER — PROPOFOL 10 MG/ML IV BOLUS
INTRAVENOUS | Status: DC | PRN
  Administered 2021-04-12: 50 mg via INTRAVENOUS
  Administered 2021-04-12: 100 mg via INTRAVENOUS
  Administered 2021-04-12: 50 mg via INTRAVENOUS
  Administered 2021-04-12: 120 mg via INTRAVENOUS

## 2021-04-12 MED ORDER — LACTATED RINGERS IV SOLN: INTRAVENOUS | Status: DC

## 2021-04-12 MED ORDER — OMEPRAZOLE 40 MG PO CPDR: 40.0000 mg | DELAYED_RELEASE_CAPSULE | Freq: Two times a day (BID) | ORAL | 3 refills | Status: DC

## 2021-04-12 MED ORDER — LIDOCAINE HCL (CARDIAC) PF 50 MG/5ML IV SOSY
PREFILLED_SYRINGE | INTRAVENOUS | Status: DC | PRN
  Administered 2021-04-12: 50 mg via INTRAVENOUS

## 2021-04-12 NOTE — Interval H&P Note (Signed)
History and Physical Interval Note:  04/12/2021 7:41 AM  Jimmy Tucker is a 60 y.o. male PMH esophageal adenocarcinoma s/p esophagectomy on 0156 at [facility] complicated by postoperative leak and recurrent strictures, COPD, HLD , HTN, chronic smoking and alcohol use, anxiety and PAD, who presents for follow up of esophageal stricture.  No dysphagia at this moment.  There were no vitals taken for this visit. GENERAL: The patient is AO x3, in no acute distress. HEENT: Head is normocephalic and atraumatic. EOMI are intact. Mouth is well hydrated and without lesions. NECK: Supple. No masses LUNGS: Clear to auscultation. No presence of rhonchi/wheezing/rales. Adequate chest expansion HEART: RRR, normal s1 and s2. ABDOMEN: Soft, nontender, no guarding, no peritoneal signs, and nondistended. BS +. No masses. EXTREMITIES: Without any cyanosis, clubbing, rash, lesions or edema. NEUROLOGIC: AOx3, no focal motor deficit. SKIN: no jaundice, no rashes   GENERAL: The patient is AO x3, in no acute distress. HEENT: Head is normocephalic and atraumatic. EOMI are intact. Mouth is well hydrated and without lesions. NECK: Supple. No masses LUNGS: Clear to auscultation. No presence of rhonchi/wheezing/rales. Adequate chest expansion HEART: RRR, normal s1 and s2. ABDOMEN: Soft, nontender, no guarding, no peritoneal signs, and nondistended. BS +. No masses. EXTREMITIES: Without any cyanosis, clubbing, rash, lesions or edema. NEUROLOGIC: AOx3, no focal motor deficit. SKIN: no jaundice, no rashes   Jimmy Tucker  has presented today for surgery, with the diagnosis of Esophageal Stricture.  The various methods of treatment have been discussed with the patient and family. After consideration of risks, benefits and other options for treatment, the patient has consented to  Procedure(s) with comments: ESOPHAGOGASTRODUODENOSCOPY (EGD) WITH PROPOFOL (N/A) - 8:30 ESOPHAGEAL DILATION (N/A) as a surgical intervention.   The patient's history has been reviewed, patient examined, no change in status, stable for surgery.  I have reviewed the patient's chart and labs.  Questions were answered to the patient's satisfaction.     [name]

## 2021-04-12 NOTE — Anesthesia Postprocedure Evaluation (Signed)
Anesthesia Post Note  Patient: Jimmy Tucker  Procedure(s) Performed: ESOPHAGOGASTRODUODENOSCOPY (EGD) WITH PROPOFOL BIOPSY  Patient location during evaluation: Phase II Anesthesia Type: General Level of consciousness: awake Pain management: pain level controlled Vital Signs Assessment: post-procedure vital signs reviewed and stable Respiratory status: spontaneous breathing and respiratory function stable Cardiovascular status: blood pressure returned to baseline and stable Postop Assessment: no headache and no apparent nausea or vomiting Anesthetic complications: no Comments: Late entry   No notable events documented.   Last Vitals:  Vitals:   04/12/21 0744 04/12/21 0921  BP: (!) 146/71 (!) 129/52  Pulse:  81  Resp:  13  Temp:  36.9 C  SpO2:  97%    Last Pain:  Vitals:   04/12/21 0921  TempSrc: Oral  PainSc: 0-No pain                 Louann Sjogren

## 2021-04-12 NOTE — Op Note (Signed)
[facility] Patient Name: Jimmy Tucker Procedure Date: 04/12/2021 8:32 AM MRN: [id number] Date of Birth: [birth year] Attending MD: Maylon Peppers ,  CSN: [id number] Age: 60 Admit Type: Outpatient Procedure:                Upper GI endoscopy Indications:              Follow-up of post-surgical anastomotic stenosis Providers:                Maylon Peppers, Caprice Kluver, Kristine L. Risa Grill, Technician Referring MD:              Medicines:                Monitored Anesthesia Care Complications:            No immediate complications. Estimated Blood Loss:     Estimated blood loss: none. Procedure:                Pre-Anesthesia Assessment:                           - Prior to the procedure, a History and Physical                            was performed, and patient medications, allergies                            and sensitivities were reviewed. The patient's                            tolerance of previous anesthesia was reviewed.                           - The risks and benefits of the procedure and the                            sedation options and risks were discussed with the                            patient. All questions were answered and informed                            consent was obtained.                           - ASA Grade Assessment: III - A patient with severe                            systemic disease.                           After obtaining informed consent, the endoscope was                            passed under direct vision. Throughout the  procedure, the patient's blood pressure, pulse, and                            oxygen saturations were monitored continuously. The                            [id number]-[id number] ([id number]) scope was introduced through the                            mouth, and advanced to the esophageal anastomosis.                            The upper GI endoscopy was accomplished  without                            difficulty. The patient tolerated the procedure                            well. The [id number]-[id number] ([id number]) scope was introduced                            through the mouth, and advanced to the second part                            of duodenum. Scope In: 8:53:23 AM Scope Out: 9:10:50 AM Total Procedure Duration: 0 hours 17 minutes 27 seconds  Findings:      One benign-appearing, intrinsic severe (stenosis; an endoscope cannot       pass) stenosis was found 23 cm from the incisors at the area of the       gastroesophageal anastomosis. This stenosis measured 1 cm (inner       diameter) x 3 cm (in length). The stenosis was traversed after       downsizing scope to a ultraslim scope. Unfortunately, given the presence       of a deep gastric ulcer in the gastric body, no dilation was attempted       due to risk of perforation. There was some scant amount of food debris       in the esophagus.      One non-bleeding cratered gastric ulcer with a clean ulcer base (Forrest       Class III) was found on the proximal area of the greater curvature of       the gastric body. The lesion was 10 mm in largest dimension and was deep       but had smooth edges. The base of the ulcer had a pinhole of 1 mm.       Biopsies from the margin of the ulcers were taken with a cold forceps       for histology.      A suture was seen proximal to the area of ulceration.      The examined duodenum was normal. Impression:               - Benign-appearing esophageal stenosis.                           - Non-bleeding deep gastric ulcer with  a clean                            ulcer base (Forrest Class III). Biopsied.                           - Possible pinhole in ulcer site.                           - Normal examined duodenum. Moderate Sedation:      Per Anesthesia Care Recommendation:           - Discharge patient to home (ambulatory).                           - Resume previous  diet.                           - Await pathology results.                           - Schedule upper GI series xray.                           - Use Prilosec (omeprazole) 40 mg PO BID.                           - Repeat upper endoscopy in 2 months for                            surveillance. Procedure Code(s):        --- Professional ---                           [phone], Esophagogastroduodenoscopy, flexible,                            transoral; with biopsy, single or multiple Diagnosis Code(s):        --- Professional ---                           K22.2, Esophageal obstruction                           K25.9, Gastric ulcer, unspecified as acute or                            chronic, without hemorrhage or perforation                           K91.89, Other postprocedural complications and                            disorders of digestive system CPT copyright 2019 American Medical Association. All rights reserved. The codes documented in this report are preliminary and upon coder review may  be revised to meet current compliance requirements. Maylon Peppers, MD Maylon Peppers,  04/12/2021 9:24:31 AM This report has been signed electronically.  Number of Addenda: 0 

## 2021-04-12 NOTE — Anesthesia Procedure Notes (Signed)
Date/Time: 04/12/2021 8:46 AM Performed by: Vista Deck, CRNA Pre-anesthesia Checklist: Patient identified, Emergency Drugs available, Suction available and Patient being monitored Patient Re-evaluated:Patient Re-evaluated prior to induction Oxygen Delivery Method: Nasal cannula Induction Type: IV induction Placement Confirmation: positive ETCO2

## 2021-04-12 NOTE — Anesthesia Preprocedure Evaluation (Addendum)
Anesthesia Evaluation  Patient identified by MRN, date of birth, ID band Patient awake    Reviewed: Allergy & Precautions, H&P , NPO status , Patient's Chart, lab work & pertinent test results, reviewed documented beta blocker date and time   Airway Mallampati: II  TM Distance: >3 FB Neck ROM: full    Dental  (+) Missing, Dental Advisory Given, Chipped, Poor Dentition   Pulmonary COPD, Current Smoker and Patient abstained from smoking.,    Pulmonary exam normal breath sounds clear to auscultation       Cardiovascular Exercise Tolerance: Good hypertension, + CAD and + Peripheral Vascular Disease   Rhythm:regular Rate:Normal     Neuro/Psych PSYCHIATRIC DISORDERS Anxiety negative neurological ROS     GI/Hepatic negative GI ROS, Neg liver ROS,   Endo/Other  Hypothyroidism   Renal/GU negative Renal ROS  negative genitourinary   Musculoskeletal   Abdominal   Peds  Hematology negative hematology ROS (+)   Anesthesia Other Findings   Reproductive/Obstetrics negative OB ROS                            Anesthesia Physical Anesthesia Plan  ASA: 3  Anesthesia Plan: General   Post-op Pain Management:    Induction:   PONV Risk Score and Plan: Propofol infusion  Airway Management Planned:   Additional Equipment:   Intra-op Plan:   Post-operative Plan:   Informed Consent: I have reviewed the patients History and Physical, chart, labs and discussed the procedure including the risks, benefits and alternatives for the proposed anesthesia with the patient or authorized representative who has indicated his/her understanding and acceptance.     Dental Advisory Given  Plan Discussed with: CRNA  Anesthesia Plan Comments:         Anesthesia Quick Evaluation

## 2021-04-12 NOTE — Transfer of Care (Signed)
Immediate Anesthesia Transfer of Care Note  Patient: Jimmy Tucker  Procedure(s) Performed: ESOPHAGOGASTRODUODENOSCOPY (EGD) WITH PROPOFOL BIOPSY  Patient Location: Short Stay  Anesthesia Type:General  Level of Consciousness: awake and patient cooperative  Airway & Oxygen Therapy: Patient Spontanous Breathing  Post-op Assessment: Report given to RN and Post -op Vital signs reviewed and stable  Post vital signs: Reviewed and stable  Last Vitals:  Vitals Value Taken Time  BP    Temp    Pulse    Resp    SpO2      Last Pain:  Vitals:   04/12/21 0843  TempSrc:   PainSc: 9          Complications: No notable events documented.

## 2021-04-12 NOTE — Discharge Instructions (Addendum)
You are being discharged to home.  Resume your previous diet - chew food thoroughly We are waiting for your pathology results.  Take Prilosec (omeprazole) 40 mg by mouth twice a day.  Schedule upper GI series. Your physician has recommended a repeat upper endoscopy in two months for surveillance.

## 2021-04-14 ENCOUNTER — Other Ambulatory Visit (INDEPENDENT_AMBULATORY_CARE_PROVIDER_SITE_OTHER): Payer: Self-pay | Admitting: Gastroenterology

## 2021-04-14 DIAGNOSIS — K257 Chronic gastric ulcer without hemorrhage or perforation: Secondary | ICD-10-CM

## 2021-04-14 LAB — SURGICAL PATHOLOGY

## 2021-04-14 MED ORDER — SUCRALFATE 1 GM/10ML PO SUSP: 1.0000 g | Freq: Three times a day (TID) | ORAL | 0 refills | Status: AC

## 2021-04-15 ENCOUNTER — Encounter (HOSPITAL_COMMUNITY): Payer: Self-pay | Admitting: Gastroenterology

## 2021-04-20 ENCOUNTER — Ambulatory Visit (HOSPITAL_COMMUNITY)
Admission: RE | Admit: 2021-04-20 | Discharge: 2021-04-20 | Disposition: A | Discharge status: Home / Self Care | Payer: Medicare Other | Source: Ambulatory Visit | Attending: Gastroenterology | Admitting: Gastroenterology

## 2021-04-20 ENCOUNTER — Other Ambulatory Visit: Payer: Self-pay

## 2021-04-20 ENCOUNTER — Other Ambulatory Visit (INDEPENDENT_AMBULATORY_CARE_PROVIDER_SITE_OTHER): Payer: Self-pay | Admitting: Gastroenterology

## 2021-04-20 DIAGNOSIS — K259 Gastric ulcer, unspecified as acute or chronic, without hemorrhage or perforation: Secondary | ICD-10-CM

## 2021-04-20 DIAGNOSIS — Z8501 Personal history of malignant neoplasm of esophagus: Secondary | ICD-10-CM | POA: Diagnosis not present

## 2021-04-20 MED ORDER — DIATRIZOATE MEGLUMINE & SODIUM 66-10 % PO SOLN
ORAL | Status: AC
  Administered 2021-04-20: 90 mL
  Filled 2021-04-20: qty 90

## 2021-04-21 ENCOUNTER — Encounter (INDEPENDENT_AMBULATORY_CARE_PROVIDER_SITE_OTHER): Payer: Self-pay

## 2021-04-21 ENCOUNTER — Other Ambulatory Visit (INDEPENDENT_AMBULATORY_CARE_PROVIDER_SITE_OTHER): Payer: Self-pay

## 2021-04-21 DIAGNOSIS — K222 Esophageal obstruction: Secondary | ICD-10-CM

## 2021-04-29 ENCOUNTER — Other Ambulatory Visit: Payer: Self-pay | Admitting: Internal Medicine

## 2021-04-29 ENCOUNTER — Ambulatory Visit (HOSPITAL_COMMUNITY)
Admission: RE | Admit: 2021-04-29 | Discharge: 2021-04-29 | Disposition: A | Discharge status: Home / Self Care | Payer: Medicare Other | Source: Ambulatory Visit | Attending: Internal Medicine | Admitting: Internal Medicine

## 2021-04-29 ENCOUNTER — Other Ambulatory Visit (HOSPITAL_COMMUNITY): Payer: Self-pay | Admitting: Internal Medicine

## 2021-04-29 ENCOUNTER — Other Ambulatory Visit: Payer: Self-pay

## 2021-04-29 DIAGNOSIS — Z681 Body mass index (BMI) 19 or less, adult: Secondary | ICD-10-CM | POA: Diagnosis not present

## 2021-04-29 DIAGNOSIS — M1991 Primary osteoarthritis, unspecified site: Secondary | ICD-10-CM | POA: Diagnosis not present

## 2021-04-29 DIAGNOSIS — I4891 Unspecified atrial fibrillation: Secondary | ICD-10-CM | POA: Diagnosis not present

## 2021-04-29 DIAGNOSIS — M5124 Other intervertebral disc displacement, thoracic region: Secondary | ICD-10-CM | POA: Diagnosis not present

## 2021-04-29 DIAGNOSIS — M4620 Osteomyelitis of vertebra, site unspecified: Secondary | ICD-10-CM

## 2021-04-29 DIAGNOSIS — G894 Chronic pain syndrome: Secondary | ICD-10-CM | POA: Diagnosis not present

## 2021-04-29 DIAGNOSIS — M5126 Other intervertebral disc displacement, lumbar region: Secondary | ICD-10-CM | POA: Diagnosis not present

## 2021-05-12 DIAGNOSIS — Z23 Encounter for immunization: Secondary | ICD-10-CM | POA: Diagnosis not present

## 2021-05-12 DIAGNOSIS — E878 Other disorders of electrolyte and fluid balance, not elsewhere classified: Secondary | ICD-10-CM | POA: Diagnosis not present

## 2021-05-12 DIAGNOSIS — J9811 Atelectasis: Secondary | ICD-10-CM | POA: Diagnosis not present

## 2021-05-12 DIAGNOSIS — E46 Unspecified protein-calorie malnutrition: Secondary | ICD-10-CM | POA: Diagnosis not present

## 2021-05-12 DIAGNOSIS — C159 Malignant neoplasm of esophagus, unspecified: Secondary | ICD-10-CM | POA: Diagnosis not present

## 2021-05-12 DIAGNOSIS — E871 Hypo-osmolality and hyponatremia: Secondary | ICD-10-CM | POA: Diagnosis not present

## 2021-05-12 DIAGNOSIS — C160 Malignant neoplasm of cardia: Secondary | ICD-10-CM | POA: Diagnosis not present

## 2021-05-12 DIAGNOSIS — Z9889 Other specified postprocedural states: Secondary | ICD-10-CM | POA: Diagnosis not present

## 2021-05-27 DIAGNOSIS — I4891 Unspecified atrial fibrillation: Secondary | ICD-10-CM | POA: Diagnosis not present

## 2021-05-27 DIAGNOSIS — G894 Chronic pain syndrome: Secondary | ICD-10-CM | POA: Diagnosis not present

## 2021-05-27 DIAGNOSIS — E063 Autoimmune thyroiditis: Secondary | ICD-10-CM | POA: Diagnosis not present

## 2021-05-27 DIAGNOSIS — Z681 Body mass index (BMI) 19 or less, adult: Secondary | ICD-10-CM | POA: Diagnosis not present

## 2021-05-27 DIAGNOSIS — M1991 Primary osteoarthritis, unspecified site: Secondary | ICD-10-CM | POA: Diagnosis not present

## 2021-05-27 DIAGNOSIS — J449 Chronic obstructive pulmonary disease, unspecified: Secondary | ICD-10-CM | POA: Diagnosis not present

## 2021-06-08 NOTE — Patient Instructions (Signed)
Jimmy Tucker  06/08/2021     @PREFPERIOPPHARMACY @   Your procedure is scheduled on  06/14/2021.   Report to [name] at  [phone]  A.M.   Call this number if you have problems the morning of surgery:  [phone]   Remember:  Follow the diet instructions given to you by the office.       Use your inhaler before you come abd bring your rescue inhaler with you.    Take these medicines the morning of surgery with A SIP OF WATER       levothyroxine, omeprazole, oxycodone(if needed), inderal.     Do not wear jewelry, make-up or nail polish.  Do not wear lotions, powders, or perfumes, or deodorant.  Do not shave 48 hours prior to surgery.  Men may shave face and neck.  Do not bring valuables to the hospital.  [facility] is not responsible for any belongings or valuables.  Contacts, dentures or bridgework may not be worn into surgery.  Leave your suitcase in the car.  After surgery it may be brought to your room.  For patients admitted to the hospital, discharge time will be determined by your treatment team.  Patients discharged the day of surgery will not be allowed to drive home and must have someone with them for 24 hours.    Special instructions:   DO NOT smoke tobacco or vape for 24 hours before your procedure.  Please read over the following fact sheets that you were given. Anesthesia Post-op Instructions and Care and Recovery After Surgery      Upper Endoscopy, Adult, Care After This sheet gives you information about how to care for yourself after your procedure. Your health care provider may also give you more specific instructions. If you have problems or questions, contact your health care provider. What can I expect after the procedure? After the procedure, it is common to have: A sore throat. Mild stomach pain or discomfort. Bloating. Nausea. Follow these instructions at home:  Follow instructions from your health care provider about what to  eat or drink after your procedure. Return to your normal activities as told by your health care provider. Ask your health care provider what activities are safe for you. Take over-the-counter and prescription medicines only as told by your health care provider. If you were given a sedative during the procedure, it can affect you for several hours. Do not drive or operate machinery until your health care provider says that it is safe. Keep all follow-up visits as told by your health care provider. This is important. Contact a health care provider if you have: A sore throat that lasts longer than one day. Trouble swallowing. Get help right away if: You vomit blood or your vomit looks like coffee grounds. You have: A fever. Bloody, black, or tarry stools. A severe sore throat or you cannot swallow. Difficulty breathing. Severe pain in your chest or abdomen. Summary After the procedure, it is common to have a sore throat, mild stomach discomfort, bloating, and nausea. If you were given a sedative during the procedure, it can affect you for several hours. Do not drive or operate machinery until your health care provider says that it is safe. Follow instructions from your health care provider about what to eat or drink after your procedure. Return to your normal activities as told by your health care provider. This information is not intended to replace advice given to you by  your health care provider. Make sure you discuss any questions you have with your health care provider. Document Revised: 04/18/2019 Document Reviewed: 11/12/2017 Elsevier Patient Education  2022 [place] After This sheet gives you information about how to care for yourself after your procedure. Your health care provider may also give you more specific instructions. If you have problems or questions, contact your health care provider. What can I expect after the procedure? After the  procedure, it is common to have: Tiredness. Forgetfulness about what happened after the procedure. Impaired judgment for important decisions. Nausea or vomiting. Some difficulty with balance. Follow these instructions at home: For the time period you were told by your health care provider:   Rest as needed. Do not participate in activities where you could fall or become injured. Do not drive or use machinery. Do not drink alcohol. Do not take sleeping pills or medicines that cause drowsiness. Do not make important decisions or sign legal documents. Do not take care of children on your own. Eating and drinking Follow the diet that is recommended by your health care provider. Drink enough fluid to keep your urine pale yellow. If you vomit: Drink water, juice, or soup when you can drink without vomiting. Make sure you have little or no nausea before eating solid foods. General instructions Have a responsible adult stay with you for the time you are told. It is important to have someone help care for you until you are awake and alert. Take over-the-counter and prescription medicines only as told by your health care provider. If you have sleep apnea, surgery and certain medicines can increase your risk for breathing problems. Follow instructions from your health care provider about wearing your sleep device: Anytime you are sleeping, including during daytime naps. While taking prescription pain medicines, sleeping medicines, or medicines that make you drowsy. Avoid smoking. Keep all follow-up visits as told by your health care provider. This is important. Contact a health care provider if: You keep feeling nauseous or you keep vomiting. You feel light-headed. You are still sleepy or having trouble with balance after 24 hours. You develop a rash. You have a fever. You have redness or swelling around the IV site. Get help right away if: You have trouble breathing. You have new-onset  confusion at home. Summary For several hours after your procedure, you may feel tired. You may also be forgetful and have poor judgment. Have a responsible adult stay with you for the time you are told. It is important to have someone help care for you until you are awake and alert. Rest as told. Do not drive or operate machinery. Do not drink alcohol or take sleeping pills. Get help right away if you have trouble breathing, or if you suddenly become confused. This information is not intended to replace advice given to you by your health care provider. Make sure you discuss any questions you have with your health care provider. Document Revised: 02/26/2020 Document Reviewed: 05/15/2019 Elsevier Patient Education  2022 [employer].

## 2021-06-10 ENCOUNTER — Encounter (HOSPITAL_COMMUNITY)
Admission: RE | Admit: 2021-06-10 | Discharge: 2021-06-10 | Disposition: A | Discharge status: Home / Self Care | Payer: Medicare Other | Source: Ambulatory Visit | Attending: Gastroenterology | Admitting: Gastroenterology

## 2021-06-10 ENCOUNTER — Other Ambulatory Visit: Payer: Self-pay

## 2021-06-14 ENCOUNTER — Ambulatory Visit (HOSPITAL_COMMUNITY): Payer: Medicare Other | Admitting: Anesthesiology

## 2021-06-14 ENCOUNTER — Encounter (HOSPITAL_COMMUNITY)
Admission: RE | Disposition: A | Discharge status: Home / Self Care | Payer: Self-pay | Source: Home / Self Care | Attending: Gastroenterology

## 2021-06-14 ENCOUNTER — Ambulatory Visit (HOSPITAL_COMMUNITY)
Admission: RE | Admit: 2021-06-14 | Discharge: 2021-06-14 | Disposition: A | Discharge status: Home / Self Care | Payer: Medicare Other | Attending: Gastroenterology | Admitting: Gastroenterology

## 2021-06-14 DIAGNOSIS — F419 Anxiety disorder, unspecified: Secondary | ICD-10-CM | POA: Diagnosis not present

## 2021-06-14 DIAGNOSIS — J449 Chronic obstructive pulmonary disease, unspecified: Secondary | ICD-10-CM | POA: Insufficient documentation

## 2021-06-14 DIAGNOSIS — K9189 Other postprocedural complications and disorders of digestive system: Secondary | ICD-10-CM | POA: Insufficient documentation

## 2021-06-14 DIAGNOSIS — K257 Chronic gastric ulcer without hemorrhage or perforation: Secondary | ICD-10-CM | POA: Insufficient documentation

## 2021-06-14 DIAGNOSIS — K259 Gastric ulcer, unspecified as acute or chronic, without hemorrhage or perforation: Secondary | ICD-10-CM | POA: Diagnosis not present

## 2021-06-14 DIAGNOSIS — I739 Peripheral vascular disease, unspecified: Secondary | ICD-10-CM | POA: Insufficient documentation

## 2021-06-14 DIAGNOSIS — F17210 Nicotine dependence, cigarettes, uncomplicated: Secondary | ICD-10-CM | POA: Insufficient documentation

## 2021-06-14 DIAGNOSIS — I10 Essential (primary) hypertension: Secondary | ICD-10-CM | POA: Diagnosis not present

## 2021-06-14 DIAGNOSIS — I2510 Atherosclerotic heart disease of native coronary artery without angina pectoris: Secondary | ICD-10-CM | POA: Diagnosis not present

## 2021-06-14 DIAGNOSIS — Z8501 Personal history of malignant neoplasm of esophagus: Secondary | ICD-10-CM | POA: Insufficient documentation

## 2021-06-14 DIAGNOSIS — K222 Esophageal obstruction: Secondary | ICD-10-CM | POA: Insufficient documentation

## 2021-06-14 DIAGNOSIS — E785 Hyperlipidemia, unspecified: Secondary | ICD-10-CM | POA: Diagnosis not present

## 2021-06-14 HISTORY — PX: ESOPHAGEAL DILATION: SHX303

## 2021-06-14 HISTORY — PX: ESOPHAGOGASTRODUODENOSCOPY (EGD) WITH PROPOFOL: SHX5813

## 2021-06-14 SURGERY — ESOPHAGOGASTRODUODENOSCOPY (EGD) WITH PROPOFOL
Anesthesia: General

## 2021-06-14 MED ORDER — PHENYLEPHRINE HCL (PRESSORS) 10 MG/ML IV SOLN
INTRAVENOUS | Status: DC | PRN
  Administered 2021-06-14: 100 ug via INTRAVENOUS

## 2021-06-14 MED ORDER — KETAMINE HCL 10 MG/ML IJ SOLN
INTRAMUSCULAR | Status: DC | PRN
  Administered 2021-06-14: 20 mg via INTRAVENOUS
  Administered 2021-06-14: 10 mg via INTRAVENOUS

## 2021-06-14 MED ORDER — IPRATROPIUM-ALBUTEROL 0.5-2.5 (3) MG/3ML IN SOLN
RESPIRATORY_TRACT | Status: AC
  Administered 2021-06-14: 07:00:00 3 mL via RESPIRATORY_TRACT
  Filled 2021-06-14: qty 3

## 2021-06-14 MED ORDER — PHENYLEPHRINE 40 MCG/ML (10ML) SYRINGE FOR IV PUSH (FOR BLOOD PRESSURE SUPPORT)
PREFILLED_SYRINGE | INTRAVENOUS | Status: AC
  Filled 2021-06-14: qty 10

## 2021-06-14 MED ORDER — IPRATROPIUM-ALBUTEROL 0.5-2.5 (3) MG/3ML IN SOLN: 3.0000 mL | Freq: Once | RESPIRATORY_TRACT | Status: AC

## 2021-06-14 MED ORDER — LIDOCAINE HCL (PF) 2 % IJ SOLN
INTRAMUSCULAR | Status: AC
  Filled 2021-06-14: qty 5

## 2021-06-14 MED ORDER — PROPOFOL 10 MG/ML IV BOLUS
INTRAVENOUS | Status: DC | PRN
  Administered 2021-06-14 (×4): 20 mg via INTRAVENOUS

## 2021-06-14 MED ORDER — KETAMINE HCL 50 MG/5ML IJ SOSY
PREFILLED_SYRINGE | INTRAMUSCULAR | Status: AC
  Filled 2021-06-14: qty 5

## 2021-06-14 MED ORDER — LACTATED RINGERS IV SOLN: INTRAVENOUS | Status: DC

## 2021-06-14 MED ORDER — LANSOPRAZOLE 30 MG PO CPDR: 30.0000 mg | DELAYED_RELEASE_CAPSULE | Freq: Two times a day (BID) | ORAL | 3 refills | Status: AC

## 2021-06-14 MED ORDER — PROPOFOL 1000 MG/100ML IV EMUL
INTRAVENOUS | Status: AC
  Filled 2021-06-14: qty 100

## 2021-06-14 NOTE — Op Note (Signed)
[facility] Patient Name: Jimmy Tucker Procedure Date: 06/14/2021 7:09 AM MRN: [id number] Date of Birth: 04-24-1961 Attending MD: Maylon Peppers ,  CSN: [id number] Age: 60 Admit Type: Outpatient Procedure:                Upper GI endoscopy Indications:              For therapy of post-surgical anastomotic stenosis,                            Follow-up of chronic gastric ulcer Providers:                Maylon Peppers, Caprice Kluver, Raphael Gibney,                            Technician Referring MD:              Medicines:                Monitored Anesthesia Care Complications:            No immediate complications. Estimated Blood Loss:     Estimated blood loss: none. Procedure:                Pre-Anesthesia Assessment:                           - Prior to the procedure, a History and Physical                            was performed, and patient medications, allergies                            and sensitivities were reviewed. The patient's                            tolerance of previous anesthesia was reviewed.                           - The risks and benefits of the procedure and the                            sedation options and risks were discussed with the                            patient. All questions were answered and informed                            consent was obtained.                           - ASA Grade Assessment: III - A patient with severe                            systemic disease.                           After obtaining informed consent, the endoscope was  passed under direct vision. Throughout the                            procedure, the patient's blood pressure, pulse, and                            oxygen saturations were monitored continuously. The                            [id number]-H190 ([id number]) scope was introduced through the                            mouth, and advanced to the esophageal anastomosis.                             The GIF-[id number] ([id number]) scope was introduced                            through the mouth, and advanced to the second part                            of duodenum. The upper GI endoscopy was                            accomplished without difficulty. The patient                            tolerated the procedure well. Scope In: 7:44:45 AM Scope Out: 8:00:31 AM Total Procedure Duration: 0 hours 15 minutes 46 seconds  Findings:      One benign-appearing, intrinsic severe (stenosis; an endoscope cannot       pass) stenosis was found 23 cm from the incisors. This stenosis measured       1 cm (inner diameter) x less than one cm (in length). There was some       scant amount of food debris in the esophagus. The stenosis was traversed       after downsizing scope to a ultraslim scope. A TTS dilator was passed       through the pediatric scope upon reinsertion. Dilation with an 02-01-09 mm       balloon dilator was performed to 10 mm. There was hematin upon       reinspection.      One non-obstructing non-bleeding chronic cratered gastric ulcer with a       clean ulcer base (Forrest Class III) was found in the gastric body,       slightly better compared to prior. The lesion was 15 mm in largest       dimension. There is no evidence of perforation.      The examined duodenum was normal. Impression:               - Benign-appearing esophageal stenosis. Dilated.                           - Non-obstructing non-bleeding gastric ulcer with a  clean ulcer base (Forrest Class III). There is no                            evidence of perforation.                           - Normal examined duodenum.                           - No specimens collected. Moderate Sedation:      Per Anesthesia Care Recommendation:           - Discharge patient to home (ambulatory).                           - Resume previous diet.                           - Repeat upper endoscopy in 4  weeks for retreatment.                           - Continue omeprazole 40 mg twice a day - open                            capsule.                           - Smoking cessation. Procedure Code(s):        --- Professional ---                           [phone], Esophagogastroduodenoscopy, flexible,                            transoral; with transendoscopic balloon dilation of                            esophagus (less than 30 mm diameter) Diagnosis Code(s):        --- Professional ---                           K22.2, Esophageal obstruction                           K25.9, Gastric ulcer, unspecified as acute or                            chronic, without hemorrhage or perforation                           K91.89, Other postprocedural complications and                            disorders of digestive system                           K25.7, Chronic gastric ulcer without hemorrhage or  perforation CPT copyright 2019 American Medical Association. All rights reserved. The codes documented in this report are preliminary and upon coder review may  be revised to meet current compliance requirements. Maylon Peppers, MD Maylon Peppers,  06/14/2021 8:10:11 AM This report has been signed electronically. Number of Addenda: 0

## 2021-06-14 NOTE — Transfer of Care (Signed)
Immediate Anesthesia Transfer of Care Note  Patient: Jimmy Tucker  Procedure(s) Performed: ESOPHAGOGASTRODUODENOSCOPY (EGD) WITH PROPOFOL ESOPHAGEAL DILATION  Patient Location: PACU  Anesthesia Type:General  Level of Consciousness: awake, alert , oriented and patient cooperative  Airway & Oxygen Therapy: Patient Spontanous Breathing and Patient connected to nasal cannula oxygen  Post-op Assessment: Report given to RN, Post -op Vital signs reviewed and stable and Patient moving all extremities X 4  Post vital signs: Reviewed and stable  Last Vitals:  Vitals Value Taken Time  BP 151/85 06/14/21 0804  Temp    Pulse 79 06/14/21 0805  Resp 18 06/14/21 0805  SpO2 96 % 06/14/21 0805  Vitals shown include unvalidated device data.  Last Pain:  Vitals:   06/14/21 0735  TempSrc:   PainSc: 9       Patients Stated Pain Goal: 6 (25/63/89 3734)  Complications: No notable events documented.

## 2021-06-14 NOTE — Discharge Instructions (Signed)
You are being discharged to home.  Resume your previous diet.  Your physician has recommended a repeat upper endoscopy in four weeks for retreatment.  Continue omeprazole 40 mg twice a day - open capsule. Smoking cessation.

## 2021-06-14 NOTE — Anesthesia Postprocedure Evaluation (Signed)
Anesthesia Post Note  Patient: Jimmy Tucker  Procedure(s) Performed: ESOPHAGOGASTRODUODENOSCOPY (EGD) WITH PROPOFOL ESOPHAGEAL DILATION  Patient location during evaluation: PACU Anesthesia Type: General Level of consciousness: awake and alert and oriented Pain management: pain level controlled Vital Signs Assessment: post-procedure vital signs reviewed and stable Respiratory status: spontaneous breathing, nonlabored ventilation and respiratory function stable Cardiovascular status: blood pressure returned to baseline and stable Postop Assessment: no apparent nausea or vomiting Anesthetic complications: no   No notable events documented.   Last Vitals:  Vitals:   06/14/21 0804 06/14/21 0815  BP: (!) 151/85 (!) 150/70  Pulse: 78 81  Resp: (!) 26 18  Temp: 36.8 C   SpO2: 97% 97%    Last Pain:  Vitals:   06/14/21 0804  TempSrc:   PainSc: 0-No pain                 Mattye Verdone C Blayke Cordrey

## 2021-06-14 NOTE — Anesthesia Preprocedure Evaluation (Signed)
Anesthesia Evaluation  Patient identified by MRN, date of birth, ID band Patient awake  General Assessment Comment:Reports difficulty swallowing after an anesthetic  Unable to name drug  Will ask Dr. Laural Golden if he remembers  Reviewed: Allergy & Precautions, NPO status , Patient's Chart, lab work & pertinent test results, reviewed documented beta blocker date and time   History of Anesthesia Complications Negative for: history of anesthetic complications  Airway Mallampati: II  TM Distance: >3 FB Neck ROM: Full    Dental  (+) Dental Advisory Given, Poor Dentition, Chipped, Missing Multiple rotten /Broken teeth :   Pulmonary COPD,  COPD inhaler, Current Smoker and Patient abstained from smoking.,  esophageal cancer, h/o tracheostomy   Pulmonary exam normal breath sounds clear to auscultation       Cardiovascular Exercise Tolerance: Good hypertension, Pt. on home beta blockers and Pt. on medications + CAD, + Cardiac Stents and + Peripheral Vascular Disease (Critical lower limb ischemia - poorly healing ulcer on left foot with occluded iliac, femoral artery on left)   Rhythm:Regular Rate:Normal + Systolic murmurs [id number] 16:08:39 [facility]-AP-OPS ROUTINE RECORD 21-Feb-1961 (42 yr) Male Caucasian Vent. rate 67 BPM PR interval 166 ms QRS duration 80 ms QT/QTcB 402/424 ms P-R-T axes 14 21 1  Normal sinus rhythm Normal ECG No significant change since last tracing Confirmed by Daneen Schick [phone]) on 03/08/2021 9:21:06 PM   Neuro/Psych PSYCHIATRIC DISORDERS Anxiety negative neurological ROS  negative psych ROS   GI/Hepatic GERD  Medicated,(+)     substance abuse (alcohol abuse)  alcohol use, Esophageal cancer   Endo/Other  Hypothyroidism   Renal/GU negative Renal ROS  negative genitourinary   Musculoskeletal  (+) Arthritis  (chronic back pain), Back sx   Abdominal   Peds negative pediatric ROS (+)  ADHD Hematology negative hematology ROS (+)   Anesthesia Other Findings Room air sats 90 - 92%  Reproductive/Obstetrics negative OB ROS                                                            Anesthesia Evaluation  Patient identified by MRN, date of birth, ID band Patient awake  General Assessment Comment:Reports difficulty swallowing after an anesthetic  Unable to name drug  Will ask Dr. Laural Golden if he remembers  Reviewed: Allergy & Precautions, NPO status , Patient's Chart, lab work & pertinent test results, reviewed documented beta blocker date and time   History of Anesthesia Complications (+) history of anesthetic complications  Airway Mallampati: II  TM Distance: >3 FB Neck ROM: Full    Dental  (+) Poor Dentition, Dental Advisory Given, Missing, Chipped Multiple rotten /Broken teeth :   Pulmonary COPD,  COPD inhaler, Current Smoker and Patient abstained from smoking.,  esophageal cancer, h/o tracheostomy   Pulmonary exam normal breath sounds clear to auscultation       Cardiovascular Exercise Tolerance: Good hypertension, Pt. on medications and Pt. on home beta blockers + CAD, + Cardiac Stents and + Peripheral Vascular Disease  Normal cardiovascular exam Rhythm:Regular Rate:Normal  Denies recent CP or ever using NTG H/o lower stent -kept on anticoagulation   Neuro/Psych PSYCHIATRIC DISORDERS Anxiety negative neurological ROS  negative psych ROS   GI/Hepatic GERD  Medicated and Controlled,(+)     substance abuse  alcohol use, H/o Esophageal Ca  s/p resection with stomach pull through, reports complications after  Here for EGD/poss dil    Endo/Other  Hypothyroidism   Renal/GU Renal disease (hyponatremia - 127)  negative genitourinary   Musculoskeletal  (+) Arthritis  (chronic back pain),   Abdominal   Peds negative pediatric ROS (+)  Hematology   Anesthesia Other Findings   Reproductive/Obstetrics negative OB  ROS                            Anesthesia Physical  Anesthesia Plan  ASA: 3  Anesthesia Plan: General   Post-op Pain Management:    Induction: Intravenous  PONV Risk Score and Plan: 1 and TIVA  Airway Management Planned: Nasal Cannula and Natural Airway  Additional Equipment:   Intra-op Plan:   Post-operative Plan:   Informed Consent: I have reviewed the patients History and Physical, chart, labs and discussed the procedure including the risks, benefits and alternatives for the proposed anesthesia with the patient or authorized representative who has indicated his/her understanding and acceptance.     Dental advisory given  Plan Discussed with: CRNA and Surgeon  Anesthesia Plan Comments:         Anesthesia Quick Evaluation  Anesthesia Physical  Anesthesia Plan  ASA: 3  Anesthesia Plan: General   Post-op Pain Management: Minimal or no pain anticipated   Induction: Intravenous  PONV Risk Score and Plan: 1 and TIVA  Airway Management Planned: Nasal Cannula and Natural Airway  Additional Equipment:   Intra-op Plan:   Post-operative Plan:   Informed Consent: I have reviewed the patients History and Physical, chart, labs and discussed the procedure including the risks, benefits and alternatives for the proposed anesthesia with the patient or authorized representative who has indicated his/her understanding and acceptance.     Dental advisory given  Plan Discussed with: CRNA and Surgeon  Anesthesia Plan Comments:         Anesthesia Quick Evaluation

## 2021-06-14 NOTE — H&P (Signed)
Jimmy Tucker is an 59 y.o. male.   Chief Complaint: follow up esophageal stricture and gastric ulcer HPI:  Jimmy Tucker is a 60 y.o. male PMH esophageal adenocarcinoma s/p esophagectomy on 8315 at [facility] complicated by postoperative leak and recurrent strictures, COPD, HLD , HTN, chronic smoking and alcohol use, anxiety and PAD, who presents for follow up of esophageal stricture and gastric ulcer.  Last EGD was performed on 04/12/2021 which showed: - Benign-appearing esophageal stenosis. - Non-bleeding deep gastric ulcer with a clean ulcer base (Forrest Class III). Biopsied. - Possible pinhole in ulcer site. - Normal examined duodenum.\  Upper GI series was negative for any fistula or perforation in the stomach.  Past Medical History:  Diagnosis Date   Abnormal nuclear stress test    Anxiety    Cancer (HCC)    Carotid artery disease (HCC)    Chronic back pain    Chronic lower back pain    "L4-5"   COPD (chronic obstructive pulmonary disease) (HCC)    Esophageal cancer (HCC)    Heavy cigarette smoker (20-39 per day)    Unmotivated   Hyperlipidemia LDL goal <70    Intolerant to statins   Hypertension    Lumbar disc disease    PAD (peripheral artery disease) ([place]) 10/30/2012   a) 2011 LEA Dopplers: Left ATA occlusion; b) 10/2013: LEA Dopplers - L Common Iliac & CFA occlusion - w/ short reconstitution at the branch point of the external and internal iliac, the external iliac is occluded through the common femoral and proximal SFA; reconstitutes in the proximal SFA. Two-vessel runoff beyond.; c) s/p LCIA Stent & CFA EA w/ patch angioplasty --> f/u doppler 12/01/2013 patent   Pollen allergies    Presence of stent in artery     Past Surgical History:  Procedure Laterality Date   BACK SURGERY  02/2008   BIOPSY  04/12/2021   Procedure: BIOPSY;  Surgeon: Harvel Quale, MD;  [facility];  Service: Gastroenterology;;   CARDIAC CATHETERIZATION N/A 02/15/2015   Procedure:  Left Heart Cath and Coronary Angiography;  Surgeon: Lorretta Harp, MD;  Location: [facility];  Service: Cardiovascular;  Laterality: N/A;   COLONOSCOPY  07/24/2011   Procedure: COLONOSCOPY;  Surgeon: Daneil Dolin, MD;  [facility];  Service: Endoscopy;  Laterality: N/A;  8:15   CORONARY STENT PLACEMENT     ENDARTERECTOMY FEMORAL Left 11/11/2013   Procedure: Left Femoral Endarterectomy with Patch Angioplasty ;  Surgeon: Angelia Mould, MD;  Location: [facility];  Service: Vascular;  Laterality: Left;   ESOPHAGEAL DILATION N/A 08/29/2019   Procedure: ESOPHAGEAL DILATION;  Surgeon: Rogene Houston, MD;  [facility];  Service: Endoscopy;  Laterality: N/A;   ESOPHAGEAL DILATION N/A 03/10/2021   Procedure: ESOPHAGEAL DILATION;  Surgeon: Rogene Houston, MD;  [facility];  Service: Endoscopy;  Laterality: N/A;   ESOPHAGOGASTRODUODENOSCOPY N/A 10/09/2014   Procedure: ESOPHAGOGASTRODUODENOSCOPY (EGD);  Surgeon: Rogene Houston, MD;  [facility];  Service: Endoscopy;  Laterality: N/A;  230 - moved to 9:55 - Ann notified pt to arrive at 8:55   ESOPHAGOGASTRODUODENOSCOPY (EGD) WITH PROPOFOL N/A 07/21/2019   Procedure: ESOPHAGOGASTRODUODENOSCOPY (EGD) WITH PROPOFOL;  Surgeon: Rogene Houston, MD;  [facility];  Service: Endoscopy;  Laterality: N/A;  11:55   ESOPHAGOGASTRODUODENOSCOPY (EGD) WITH PROPOFOL N/A 08/29/2019   Procedure: ESOPHAGOGASTRODUODENOSCOPY (EGD) WITH PROPOFOL;  Surgeon: Rogene Houston, MD;  [facility];  Service: Endoscopy;  Laterality: N/A;  730   ESOPHAGOGASTRODUODENOSCOPY (EGD) WITH PROPOFOL N/A 03/10/2021   Procedure: ESOPHAGOGASTRODUODENOSCOPY (EGD) WITH PROPOFOL;  Surgeon: Rogene Houston, MD;  [facility];  Service: Endoscopy;  Laterality: N/A;  1:20 / Following Dr Gala Romney in Room 3 at the end Per Dr Laural Golden - per office ok'd by Dr. Gala Romney   ESOPHAGOGASTRODUODENOSCOPY (EGD) WITH PROPOFOL N/A  04/12/2021   Procedure: ESOPHAGOGASTRODUODENOSCOPY (EGD) WITH PROPOFOL;  Surgeon: Harvel Quale, MD;  [facility];  Service: Gastroenterology;  Laterality: N/A;  8:30   ILIAC ARTERY STENT Left 11/03/2013   Dr. Judithann Sauger   IR FLUORO GUIDED NEEDLE [place] ASPIRATION/INJECTION LOC  08/03/2018   LOWER EXTREMITY ANGIOGRAM N/A 11/03/2013   Procedure: LOWER EXTREMITY ANGIOGRAM;  Surgeon: Lorretta Harp, MD;  Location: [facility] CATH LAB;  Service: Cardiovascular;  Laterality: N/A;   LUMBAR MICRODISCECTOMY Left 02/2008   L4-5   NM MYOCAR PERF WALL MOTION  07/2009   persantine - normal perfusion   SURGERY SCROTAL / TESTICULAR Left    cyst excision   THORACIC ESOPHAGUS REPLACEMENT      Family History  Problem Relation Age of Onset   Heart attack Father 38       Cardiac arrest   Sudden death Father    Stroke Brother 39   Stroke Mother    Colon cancer Neg Hx    Social History:  reports that he has been smoking cigarettes. He has a 20.00 pack-year smoking history. He has never used smokeless tobacco. He reports current alcohol use of about 21.0 standard drinks per week. He reports that he does not use drugs.  Allergies:  Allergies  Allergen Reactions   Drug [Tape]     PLEASE USE COBAN ,,,, ADHESIVE TAPE TEARS SKIN    Glycopyrrolate Other (See Comments)    Intolerance--unknown reaction   Warfarin And Related Other (See Comments)    Excessive bleeding    Medications Prior to Admission  Medication Sig Dispense Refill   albuterol (VENTOLIN HFA) 108 (90 Base) MCG/ACT inhaler Inhale 2 puffs into the lungs every 4 (four) hours as needed for wheezing or shortness of breath.      ALPRAZolam (XANAX) 1 MG tablet Take 1 mg by mouth at bedtime as needed for anxiety or sleep.     amphetamine-dextroamphetamine (ADDERALL) 5 MG tablet Take 5 mg by mouth 2 (two) times daily. Morning & 1400     chlorhexidine (PERIDEX) 0.12 % solution Use as directed 15 mLs in the mouth or throat 4 (four) times daily.      Cholecalciferol (VITAMIN D3) 50 MCG (2000 UT) CHEW Chew 4,000 Units by mouth daily.     levothyroxine (SYNTHROID) 50 MCG tablet Take 50 mcg by mouth daily before breakfast.      omeprazole (PRILOSEC) 40 MG capsule Take 1 capsule (40 mg total) by mouth 2 (two) times daily. 180 capsule 3   Oxycodone HCl 20 MG TABS Take 1 tablet (20 mg total) by mouth every 4 (four) hours as needed (pain). 30 tablet 0   propranolol (INDERAL) 20 MG tablet Take 20 mg by mouth in the morning.     Tiotropium Bromide-Olodaterol 2.5-2.5 MCG/ACT AERS Inhale 2 puffs into the lungs daily.     aspirin EC 81 MG tablet Take 1 tablet (81 mg total) by mouth daily. 30 tablet 11   diphenoxylate-atropine (LOMOTIL) 2.5-0.025 MG tablet Take 1 tablet by mouth every 6 (six) hours as needed for diarrhea or  loose stools.      sucralfate (CARAFATE) 1 GM/10ML suspension Take 10 mLs (1 g total) by mouth 4 (four) times daily -  with meals and at bedtime. 420 mL 0    No results found for this or any previous visit (from the past 48 hour(s)). No results found.  Review of Systems  Constitutional: Negative.   HENT: Negative.    Eyes: Negative.   Respiratory: Negative.    Cardiovascular: Negative.   Gastrointestinal: Negative.   Endocrine: Negative.   Genitourinary: Negative.   Musculoskeletal: Negative.   Skin: Negative.   Allergic/Immunologic: Negative.   Neurological: Negative.   Hematological: Negative.   Psychiatric/Behavioral: Negative.     Blood pressure 136/67, pulse 83, temperature 98.5 F (36.9 C), temperature source Oral, resp. rate 20, SpO2 93 %. Physical Exam  GENERAL: The patient is AO x3, in no acute distress. Underweight. HEENT: Head is normocephalic and atraumatic. EOMI are intact. Mouth is well hydrated and without lesions. NECK: Supple. No masses LUNGS: Clear to auscultation. No presence of rhonchi/wheezing/rales. Adequate chest expansion HEART: RRR, normal s1 and s2. ABDOMEN: Soft, nontender, no guarding,  no peritoneal signs, and nondistended. BS +. No masses. EXTREMITIES: Without any cyanosis, clubbing, rash, lesions or edema. NEUROLOGIC: AOx3, no focal motor deficit. SKIN: no jaundice, no rashes  Assessment/Plan Dayna Geurts Tucker is a 60 y.o. male PMH esophageal adenocarcinoma s/p esophagectomy on 3606 at [facility] complicated by postoperative leak and recurrent strictures, COPD, HLD , HTN, chronic smoking and alcohol use, anxiety and PAD, who presents for follow up of esophageal stricture and gastric ulcer.  We will proceed with EGD and possible esophageal dilation.  Harvel Quale, MD 06/14/2021, 7:31 AM

## 2021-06-16 ENCOUNTER — Encounter (HOSPITAL_COMMUNITY): Payer: Self-pay | Admitting: Gastroenterology

## 2021-06-28 DIAGNOSIS — G894 Chronic pain syndrome: Secondary | ICD-10-CM | POA: Diagnosis not present

## 2021-06-28 DIAGNOSIS — I4891 Unspecified atrial fibrillation: Secondary | ICD-10-CM | POA: Diagnosis not present

## 2021-06-28 DIAGNOSIS — Z681 Body mass index (BMI) 19 or less, adult: Secondary | ICD-10-CM | POA: Diagnosis not present

## 2021-06-28 DIAGNOSIS — J449 Chronic obstructive pulmonary disease, unspecified: Secondary | ICD-10-CM | POA: Diagnosis not present

## 2021-06-28 DIAGNOSIS — E063 Autoimmune thyroiditis: Secondary | ICD-10-CM | POA: Diagnosis not present

## 2021-07-26 ENCOUNTER — Other Ambulatory Visit (INDEPENDENT_AMBULATORY_CARE_PROVIDER_SITE_OTHER): Payer: Self-pay

## 2021-07-26 ENCOUNTER — Encounter (INDEPENDENT_AMBULATORY_CARE_PROVIDER_SITE_OTHER): Payer: Self-pay

## 2021-07-26 DIAGNOSIS — K222 Esophageal obstruction: Secondary | ICD-10-CM

## 2021-07-26 DIAGNOSIS — K257 Chronic gastric ulcer without hemorrhage or perforation: Secondary | ICD-10-CM

## 2021-07-29 DIAGNOSIS — G894 Chronic pain syndrome: Secondary | ICD-10-CM | POA: Diagnosis not present

## 2021-07-29 DIAGNOSIS — I4891 Unspecified atrial fibrillation: Secondary | ICD-10-CM | POA: Diagnosis not present

## 2021-07-29 DIAGNOSIS — J449 Chronic obstructive pulmonary disease, unspecified: Secondary | ICD-10-CM | POA: Diagnosis not present

## 2021-08-09 ENCOUNTER — Encounter: Payer: Self-pay | Admitting: Cardiovascular Disease

## 2021-08-17 ENCOUNTER — Encounter (HOSPITAL_COMMUNITY)
Admission: RE | Disposition: A | Discharge status: Home / Self Care | Payer: Self-pay | Source: Home / Self Care | Attending: Gastroenterology

## 2021-08-17 ENCOUNTER — Other Ambulatory Visit: Payer: Self-pay

## 2021-08-17 ENCOUNTER — Encounter (HOSPITAL_COMMUNITY): Payer: Self-pay | Admitting: Gastroenterology

## 2021-08-17 ENCOUNTER — Ambulatory Visit (HOSPITAL_COMMUNITY): Payer: Medicare Other | Admitting: Anesthesiology

## 2021-08-17 ENCOUNTER — Ambulatory Visit (HOSPITAL_BASED_OUTPATIENT_CLINIC_OR_DEPARTMENT_OTHER): Payer: Medicare Other | Admitting: Anesthesiology

## 2021-08-17 ENCOUNTER — Ambulatory Visit (HOSPITAL_COMMUNITY)
Admission: RE | Admit: 2021-08-17 | Discharge: 2021-08-17 | Disposition: A | Discharge status: Home / Self Care | Payer: Medicare Other | Attending: Gastroenterology | Admitting: Gastroenterology

## 2021-08-17 DIAGNOSIS — J449 Chronic obstructive pulmonary disease, unspecified: Secondary | ICD-10-CM | POA: Insufficient documentation

## 2021-08-17 DIAGNOSIS — K259 Gastric ulcer, unspecified as acute or chronic, without hemorrhage or perforation: Secondary | ICD-10-CM | POA: Diagnosis not present

## 2021-08-17 DIAGNOSIS — K219 Gastro-esophageal reflux disease without esophagitis: Secondary | ICD-10-CM | POA: Diagnosis not present

## 2021-08-17 DIAGNOSIS — F419 Anxiety disorder, unspecified: Secondary | ICD-10-CM | POA: Insufficient documentation

## 2021-08-17 DIAGNOSIS — F17210 Nicotine dependence, cigarettes, uncomplicated: Secondary | ICD-10-CM | POA: Diagnosis not present

## 2021-08-17 DIAGNOSIS — K257 Chronic gastric ulcer without hemorrhage or perforation: Secondary | ICD-10-CM

## 2021-08-17 DIAGNOSIS — F909 Attention-deficit hyperactivity disorder, unspecified type: Secondary | ICD-10-CM | POA: Diagnosis not present

## 2021-08-17 DIAGNOSIS — K222 Esophageal obstruction: Secondary | ICD-10-CM | POA: Insufficient documentation

## 2021-08-17 DIAGNOSIS — Z8501 Personal history of malignant neoplasm of esophagus: Secondary | ICD-10-CM | POA: Diagnosis not present

## 2021-08-17 DIAGNOSIS — Z7901 Long term (current) use of anticoagulants: Secondary | ICD-10-CM | POA: Insufficient documentation

## 2021-08-17 DIAGNOSIS — E039 Hypothyroidism, unspecified: Secondary | ICD-10-CM | POA: Diagnosis not present

## 2021-08-17 DIAGNOSIS — I739 Peripheral vascular disease, unspecified: Secondary | ICD-10-CM | POA: Insufficient documentation

## 2021-08-17 DIAGNOSIS — I10 Essential (primary) hypertension: Secondary | ICD-10-CM | POA: Diagnosis not present

## 2021-08-17 DIAGNOSIS — Z9049 Acquired absence of other specified parts of digestive tract: Secondary | ICD-10-CM | POA: Diagnosis not present

## 2021-08-17 DIAGNOSIS — I2510 Atherosclerotic heart disease of native coronary artery without angina pectoris: Secondary | ICD-10-CM | POA: Diagnosis not present

## 2021-08-17 DIAGNOSIS — E785 Hyperlipidemia, unspecified: Secondary | ICD-10-CM | POA: Insufficient documentation

## 2021-08-17 HISTORY — PX: BALLOON DILATION: SHX5330

## 2021-08-17 HISTORY — PX: KENALOG INJECTION: SHX5298

## 2021-08-17 HISTORY — PX: ESOPHAGOGASTRODUODENOSCOPY (EGD) WITH PROPOFOL: SHX5813

## 2021-08-17 SURGERY — ESOPHAGOGASTRODUODENOSCOPY (EGD) WITH PROPOFOL
Anesthesia: General

## 2021-08-17 MED ORDER — TRIAMCINOLONE ACETONIDE 40 MG/ML IJ SUSP
40.0000 mg | Freq: Once | INTRAMUSCULAR | Status: DC
  Filled 2021-08-17: qty 1

## 2021-08-17 MED ORDER — PROPOFOL 500 MG/50ML IV EMUL
INTRAVENOUS | Status: DC | PRN
Start: 2021-08-17 — End: 2021-08-17
  Administered 2021-08-17: 180 ug/kg/min via INTRAVENOUS

## 2021-08-17 MED ORDER — LIDOCAINE VISCOUS HCL 2 % MT SOLN
OROMUCOSAL | Status: AC
  Filled 2021-08-17: qty 15

## 2021-08-17 MED ORDER — PROPOFOL 10 MG/ML IV BOLUS
INTRAVENOUS | Status: DC | PRN
  Administered 2021-08-17: 60 mg via INTRAVENOUS

## 2021-08-17 MED ORDER — LACTATED RINGERS IV SOLN: INTRAVENOUS | Status: DC

## 2021-08-17 MED ORDER — SODIUM CHLORIDE FLUSH 0.9 % IV SOLN
INTRAVENOUS | Status: AC
  Filled 2021-08-17: qty 30

## 2021-08-17 MED ORDER — LIDOCAINE HCL (CARDIAC) PF 100 MG/5ML IV SOSY
PREFILLED_SYRINGE | INTRAVENOUS | Status: DC | PRN
  Administered 2021-08-17: 50 mg via INTRAVENOUS

## 2021-08-17 MED ORDER — TRIAMCINOLONE ACETONIDE 10 MG/ML IJ SUSP
40.0000 mg | Freq: Once | INTRAMUSCULAR | Status: DC
  Filled 2021-08-17: qty 4

## 2021-08-17 MED ORDER — TRIAMCINOLONE ACETONIDE 40 MG/ML IJ SUSP
INTRAMUSCULAR | Status: DC | PRN
  Administered 2021-08-17: 40 mg

## 2021-08-17 NOTE — Anesthesia Preprocedure Evaluation (Addendum)
Anesthesia Evaluation  Patient identified by MRN, date of birth, ID band Patient awake  General Assessment Comment:Reports difficulty swallowing after an anesthetic  Unable to name drug  Will ask Dr. Laural Golden if he remembers  Reviewed: Allergy & Precautions, NPO status , Patient's Chart, lab work & pertinent test results, reviewed documented beta blocker date and time   History of Anesthesia Complications Negative for: history of anesthetic complications  Airway Mallampati: II  TM Distance: >3 FB Neck ROM: Full    Dental  (+) Dental Advisory Given, Poor Dentition, Chipped, Missing Multiple rotten /Broken teeth :   Pulmonary neg shortness of breath, COPD,  COPD inhaler, Current Smoker and Patient abstained from smoking.,  esophageal cancer, h/o tracheostomy   Pulmonary exam normal breath sounds clear to auscultation       Cardiovascular Exercise Tolerance: Poor METS (unsteady gait): hypertension, Pt. on home beta blockers and Pt. on medications + CAD, + Cardiac Stents and + Peripheral Vascular Disease (Critical lower limb ischemia - poorly healing ulcer on left foot with occluded iliac, femoral artery on left)   Rhythm:Regular Rate:Normal + Systolic murmurs [id number] 16:08:39 [facility] ROUTINE RECORD 1960-12-27 (75 yr) Male Caucasian Vent. rate 67 BPM PR interval 166 ms QRS duration 80 ms QT/QTcB 402/424 ms P-R-T axes 14 21 1  Normal sinus rhythm Normal ECG No significant change since last tracing Confirmed by Daneen Schick [phone]) on 03/08/2021 9:21:06 PM   Neuro/Psych PSYCHIATRIC DISORDERS Anxiety negative neurological ROS  negative psych ROS   GI/Hepatic GERD  Medicated,(+)     substance abuse (alcohol abuse)  alcohol use, Esophageal cancer   Endo/Other  Hypothyroidism   Renal/GU negative Renal ROS  negative genitourinary   Musculoskeletal  (+) Arthritis  (chronic back pain), Back sx    Abdominal   Peds negative pediatric ROS (+) ADHD Hematology  (+) Blood dyscrasia (on xarelto), ,   Anesthesia Other Findings Room air sats 90 - 92%  Reproductive/Obstetrics negative OB ROS                                                             Anesthesia Evaluation  Patient identified by MRN, date of birth, ID band Patient awake  General Assessment Comment:Reports difficulty swallowing after an anesthetic  Unable to name drug  Will ask Dr. Laural Golden if he remembers  Reviewed: Allergy & Precautions, NPO status , Patient's Chart, lab work & pertinent test results, reviewed documented beta blocker date and time   History of Anesthesia Complications (+) history of anesthetic complications  Airway Mallampati: II  TM Distance: >3 FB Neck ROM: Full    Dental  (+) Poor Dentition, Dental Advisory Given, Missing, Chipped Multiple rotten /Broken teeth :   Pulmonary COPD,  COPD inhaler, Current Smoker and Patient abstained from smoking.,  esophageal cancer, h/o tracheostomy   Pulmonary exam normal breath sounds clear to auscultation       Cardiovascular Exercise Tolerance: Good hypertension, Pt. on medications and Pt. on home beta blockers + CAD, + Cardiac Stents and + Peripheral Vascular Disease  Normal cardiovascular exam Rhythm:Regular Rate:Normal  Denies recent CP or ever using NTG H/o lower stent -kept on anticoagulation   Neuro/Psych PSYCHIATRIC DISORDERS Anxiety negative neurological ROS  negative psych ROS   GI/Hepatic GERD  Medicated and Controlled,(+)  substance abuse  alcohol use, H/o Esophageal Ca s/p resection with stomach pull through, reports complications after  Here for EGD/poss dil    Endo/Other  Hypothyroidism   Renal/GU Renal disease (hyponatremia - 127)  negative genitourinary   Musculoskeletal  (+) Arthritis  (chronic back pain),   Abdominal   Peds negative pediatric ROS (+)  Hematology    Anesthesia Other Findings   Reproductive/Obstetrics negative OB ROS                            Anesthesia Physical  Anesthesia Plan  ASA: 3  Anesthesia Plan: General   Post-op Pain Management:    Induction: Intravenous  PONV Risk Score and Plan: 1 and TIVA  Airway Management Planned: Nasal Cannula and Natural Airway  Additional Equipment:   Intra-op Plan:   Post-operative Plan:   Informed Consent: I have reviewed the patients History and Physical, chart, labs and discussed the procedure including the risks, benefits and alternatives for the proposed anesthesia with the patient or authorized representative who has indicated his/her understanding and acceptance.     Dental advisory given  Plan Discussed with: CRNA and Surgeon  Anesthesia Plan Comments:         Anesthesia Quick Evaluation  Anesthesia Physical  Anesthesia Plan  ASA: 3  Anesthesia Plan: General   Post-op Pain Management: Minimal or no pain anticipated   Induction: Intravenous  PONV Risk Score and Plan: 1 and TIVA  Airway Management Planned: Nasal Cannula and Natural Airway  Additional Equipment:   Intra-op Plan:   Post-operative Plan:   Informed Consent: I have reviewed the patients History and Physical, chart, labs and discussed the procedure including the risks, benefits and alternatives for the proposed anesthesia with the patient or authorized representative who has indicated his/her understanding and acceptance.     Dental advisory given  Plan Discussed with: CRNA and Surgeon  Anesthesia Plan Comments:         Anesthesia Quick Evaluation

## 2021-08-17 NOTE — Discharge Instructions (Signed)
You are being discharged to home.  Advance your diet as tolerated.  Your physician has recommended a repeat upper endoscopy in six weeks for surveillance.  Do not take any ibuprofen (including Advil, Motrin or Nuprin), naproxen, or other non-steroidal anti-inflammatory drugs. Smoking cessation.  Continue open capsule lansoprazole 30 mg twice a day.

## 2021-08-17 NOTE — Anesthesia Postprocedure Evaluation (Signed)
Anesthesia Post Note  Patient: Audley Hinojos [place]  Procedure(s) Performed: ESOPHAGOGASTRODUODENOSCOPY (EGD) WITH PROPOFOL BALLOON DILATION KENALOG INJECTION  Patient location during evaluation: Endoscopy Anesthesia Type: General Level of consciousness: awake and alert and oriented Pain management: pain level controlled Vital Signs Assessment: post-procedure vital signs reviewed and stable Respiratory status: spontaneous breathing, nonlabored ventilation and respiratory function stable Cardiovascular status: blood pressure returned to baseline and stable Postop Assessment: no apparent nausea or vomiting Anesthetic complications: no   No notable events documented.   Last Vitals:  Vitals:   08/17/21 1117 08/17/21 1118  BP: (!) 102/54 (!) 98/52  Pulse: 72 73  Resp: 15 16  Temp:    SpO2: 100% 100%    Last Pain:  Vitals:   08/17/21 1125  TempSrc:   PainSc: 0-No pain                 Babacar Haycraft C Sabrina Keough

## 2021-08-17 NOTE — Transfer of Care (Signed)
Immediate Anesthesia Transfer of Care Note  Patient: Jimmy Tucker  Procedure(s) Performed: ESOPHAGOGASTRODUODENOSCOPY (EGD) WITH PROPOFOL BALLOON DILATION  Patient Location: PACU  Anesthesia Type:General  Level of Consciousness: awake, alert  and oriented  Airway & Oxygen Therapy: Patient Spontanous Breathing and Patient connected to nasal cannula oxygen  Post-op Assessment: Report given to RN, Post -op Vital signs reviewed and stable, Patient moving all extremities X 4 and Patient able to stick tongue midline  Post vital signs: Reviewed  Last Vitals:  Vitals Value Taken Time  BP 99/51   Temp 97.7   Pulse 72   Resp 18   SpO2 100     Last Pain:  Vitals:   08/17/21 1040  TempSrc:   PainSc: 8          Complications: No notable events documented.

## 2021-08-17 NOTE — H&P (Signed)
Jimmy Tucker is an 61 y.o. male.   Chief Complaint: esophageal stricture HPI: Jimmy Tucker is a 61 y.o. male PMH esophageal adenocarcinoma s/p esophagectomy on 8119 at [facility] complicated by postoperative leak and recurrent strictures, COPD, HLD , HTN, chronic smoking and alcohol use, anxiety and PAD, who presents for follow up of esophageal stricture and gastric ulcer.  Last EGD was performed on 06/14/21 One benign-appearing, intrinsic severe (stenosis; an endoscope cannot pass) stenosis was found 23 cm from the incisors. This stenosis measured 1 cm (inner diameter) x less than one cm (in length). There was some scant amount of food debris in the esophagus. The stenosis was traversed after downsizing scope to a ultraslim scope. A TTS dilator was passed through the pediatric scope upon reinsertion. Dilation with an 02-01-09 mm balloon dilator was performed to 10 mm. There was hematin upon reinspection. One non-obstructing non-bleeding chronic cratered gastric ulcer with a clean ulcer base (Forrest Class III) was found in the gastric body, slightly better compared to prior. The lesion was 15 mm in largest dimension. There is no evidence of perforation. The examined duodenum was normal.  Past Medical History:  Diagnosis Date   Abnormal nuclear stress test    Anxiety    Cancer (HCC)    Carotid artery disease (HCC)    Chronic back pain    Chronic lower back pain    "L4-5"   COPD (chronic obstructive pulmonary disease) (HCC)    Esophageal cancer (HCC)    Heavy cigarette smoker (20-39 per day)    Unmotivated   Hyperlipidemia LDL goal <70    Intolerant to statins   Hypertension    Lumbar disc disease    PAD (peripheral artery disease) ([place]) 10/30/2012   a) 2011 LEA Dopplers: Left ATA occlusion; b) 10/2013: LEA Dopplers - L Common Iliac & CFA occlusion - w/ short reconstitution at the branch point of the external and internal iliac, the external iliac is occluded through the common femoral and  proximal SFA; reconstitutes in the proximal SFA. Two-vessel runoff beyond.; c) s/p LCIA Stent & CFA EA w/ patch angioplasty --> f/u doppler 12/01/2013 patent   Pollen allergies    Presence of stent in artery     Past Surgical History:  Procedure Laterality Date   BACK SURGERY  02/2008   BIOPSY  04/12/2021   Procedure: BIOPSY;  Surgeon: Harvel Quale, MD;  [facility];  Service: Gastroenterology;;   CARDIAC CATHETERIZATION N/A 02/15/2015   Procedure: Left Heart Cath and Coronary Angiography;  Surgeon: Lorretta Harp, MD;  Location: [place] [facility];  Service: Cardiovascular;  Laterality: N/A;   COLONOSCOPY  07/24/2011   Procedure: COLONOSCOPY;  Surgeon: Daneil Dolin, MD;  [facility];  Service: Endoscopy;  Laterality: N/A;  8:15   CORONARY STENT PLACEMENT     ENDARTERECTOMY FEMORAL Left 11/11/2013   Procedure: Left Femoral Endarterectomy with Patch Angioplasty ;  Surgeon: Angelia Mould, MD;  Location: [place];  Service: Vascular;  Laterality: Left;   ESOPHAGEAL DILATION N/A 08/29/2019   Procedure: ESOPHAGEAL DILATION;  Surgeon: Rogene Houston, MD;  [facility];  Service: Endoscopy;  Laterality: N/A;   ESOPHAGEAL DILATION N/A 03/10/2021   Procedure: ESOPHAGEAL DILATION;  Surgeon: Rogene Houston, MD;  [facility];  Service: Endoscopy;  Laterality: N/A;   ESOPHAGEAL DILATION  06/14/2021   Procedure: ESOPHAGEAL DILATION;  Surgeon: Harvel Quale, MD;  [facility];  Service:  Gastroenterology;;   ESOPHAGOGASTRODUODENOSCOPY N/A 10/09/2014   Procedure: ESOPHAGOGASTRODUODENOSCOPY (EGD);  Surgeon: Rogene Houston, MD;  [facility];  Service: Endoscopy;  Laterality: N/A;  230 - moved to 9:55 - Ann notified pt to arrive at 8:55   ESOPHAGOGASTRODUODENOSCOPY (EGD) WITH PROPOFOL N/A 07/21/2019   Procedure: ESOPHAGOGASTRODUODENOSCOPY (EGD) WITH PROPOFOL;  Surgeon: Rogene Houston, MD;  [facility];  Service: Endoscopy;  Laterality: N/A;  11:55   ESOPHAGOGASTRODUODENOSCOPY (EGD) WITH PROPOFOL N/A 08/29/2019   Procedure: ESOPHAGOGASTRODUODENOSCOPY (EGD) WITH PROPOFOL;  Surgeon: Rogene Houston, MD;  [facility];  Service: Endoscopy;  Laterality: N/A;  730   ESOPHAGOGASTRODUODENOSCOPY (EGD) WITH PROPOFOL N/A 03/10/2021   Procedure: ESOPHAGOGASTRODUODENOSCOPY (EGD) WITH PROPOFOL;  Surgeon: Rogene Houston, MD;  [facility];  Service: Endoscopy;  Laterality: N/A;  1:20 / Following Dr Gala Romney in Room 3 at the end Per Dr Laural Golden - per office ok'd by Dr. Gala Romney   ESOPHAGOGASTRODUODENOSCOPY (EGD) WITH PROPOFOL N/A 04/12/2021   Procedure: ESOPHAGOGASTRODUODENOSCOPY (EGD) WITH PROPOFOL;  Surgeon: Harvel Quale, MD;  [facility];  Service: Gastroenterology;  Laterality: N/A;  8:30   ESOPHAGOGASTRODUODENOSCOPY (EGD) WITH PROPOFOL N/A 06/14/2021   Procedure: ESOPHAGOGASTRODUODENOSCOPY (EGD) WITH PROPOFOL;  Surgeon: Harvel Quale, MD;  [facility];  Service: Gastroenterology;  Laterality: N/A;  7:30   ILIAC ARTERY STENT Left 11/03/2013   Dr. Judithann Sauger   IR FLUORO GUIDED NEEDLE [place] ASPIRATION/INJECTION LOC  08/03/2018   LOWER EXTREMITY ANGIOGRAM N/A 11/03/2013   Procedure: LOWER EXTREMITY ANGIOGRAM;  Surgeon: Lorretta Harp, MD;  Location: [facility] C[facility];  Service: Cardiovascular;  Laterality: N/A;   LUMBAR MICRODISCECTOMY Left 02/2008   L4-5   NM MYOCAR PERF WALL MOTION  07/2009   persantine - normal perfusion   SURGERY SCROTAL / TESTICULAR Left    cyst excision   THORACIC ESOPHAGUS REPLACEMENT      Family History  Problem Relation Age of Onset   Heart attack Father 43       Cardiac arrest   Sudden death Father    Stroke Brother 34   Stroke Mother    Colon cancer Neg Hx    Social History:  reports that he has been smoking cigarettes. He has a 20.00 pack-year smoking history. He has never used smokeless tobacco. He reports current  alcohol use of about 21.0 standard drinks per week. He reports that he does not use drugs.  Allergies:  Allergies  Allergen Reactions   Drug [Tape]     PLEASE USE COBAN ,,,, ADHESIVE TAPE TEARS SKIN    Glycopyrrolate Other (See Comments)    Intolerance--unknown reaction   Warfarin And Related Other (See Comments)    Excessive bleeding    Medications Prior to Admission  Medication Sig Dispense Refill   albuterol (VENTOLIN HFA) 108 (90 Base) MCG/ACT inhaler Inhale 2 puffs into the lungs every 4 (four) hours as needed for wheezing or shortness of breath.      ALPRAZolam (XANAX) 1 MG tablet Take 1 mg by mouth at bedtime as needed for anxiety or sleep.     amphetamine-dextroamphetamine (ADDERALL) 5 MG tablet Take 5 mg by mouth 2 (two) times daily. Morning & 1400     aspirin EC 81 MG tablet Take 1 tablet (81 mg total) by mouth daily. 30 tablet 11   chlorhexidine (PERIDEX) 0.12 % solution Use as directed 15 mLs in the mouth or throat 4 (four) times daily.  Cholecalciferol (VITAMIN D3) 50 MCG (2000 UT) CHEW Chew 4,000 Units by mouth daily.     diphenoxylate-atropine (LOMOTIL) 2.5-0.025 MG tablet Take 1 tablet by mouth every 6 (six) hours as needed for diarrhea or loose stools.      lansoprazole (PREVACID) 30 MG capsule Take 1 capsule (30 mg total) by mouth 2 (two) times daily before a meal. 180 capsule 3   levothyroxine (SYNTHROID) 50 MCG tablet Take 50 mcg by mouth daily before breakfast.      Oxycodone HCl 20 MG TABS Take 1 tablet (20 mg total) by mouth every 4 (four) hours as needed (pain). 30 tablet 0   propranolol (INDERAL) 20 MG tablet Take 20 mg by mouth in the morning.     sucralfate (CARAFATE) 1 GM/10ML suspension Take 10 mLs (1 g total) by mouth 4 (four) times daily -  with meals and at bedtime. 420 mL 0   Tiotropium Bromide-Olodaterol 2.5-2.5 MCG/ACT AERS Inhale 2 puffs into the lungs daily.      No results found for this or any previous visit (from the past 48 hour(s)). No  results found.  Review of Systems  Constitutional: Negative.   HENT: Negative.    Eyes: Negative.   Respiratory: Negative.    Cardiovascular: Negative.   Gastrointestinal: Negative.   Endocrine: Negative.   Genitourinary: Negative.   Musculoskeletal: Negative.   Allergic/Immunologic: Negative.   Neurological: Negative.   Hematological: Negative.   Psychiatric/Behavioral: Negative.     Blood pressure 136/73, pulse 76, temperature 98 F (36.7 C), temperature source Oral, resp. rate 15, height 5\' 6"  (1.676 m), weight 50.8 kg, SpO2 97 %. Physical Exam  GENERAL: The patient is AO x3, in no acute distress. Underweight. HEENT: Head is normocephalic and atraumatic. EOMI are intact. Mouth is well hydrated and without lesions. NECK: Supple. No masses LUNGS: Clear to auscultation. No presence of rhonchi/wheezing/rales. Adequate chest expansion HEART: RRR, normal s1 and s2. ABDOMEN: Soft, nontender, no guarding, no peritoneal signs, and nondistended. BS +. No masses. EXTREMITIES: Without any cyanosis, clubbing, rash, lesions or edema. NEUROLOGIC: AOx3, no focal motor deficit. SKIN: no jaundice, no rashes  Assessment/Plan Hridaan Bouse Tucker is a 61 y.o. male PMH esophageal adenocarcinoma s/p esophagectomy on 3329 at [facility] complicated by postoperative leak and recurrent strictures, COPD, HLD , HTN, chronic smoking and alcohol use, anxiety and PAD, who presents for follow up of esophageal stricture and gastric ulcer.  We will proceed with EGD.  Harvel Quale, MD 08/17/2021, 10:29 AM

## 2021-08-17 NOTE — Op Note (Signed)
[facility] Patient Name: Jimmy Tucker Procedure Date: 08/17/2021 10:27 AM MRN: [id number] Date of Birth: 26-Aug-1960 Attending MD: Maylon Peppers ,  CSN: [id number] Age: 61 Admit Type: Outpatient Procedure:                Upper GI endoscopy Indications:              For therapy of esophageal stricture Providers:                Maylon Peppers, Janeece Riggers, RN, Hughie Closs RN,                            RN, Aram Candela Referring MD:              Medicines:                Monitored Anesthesia Care Complications:            No immediate complications. Estimated Blood Loss:     Estimated blood loss: none. Procedure:                Pre-Anesthesia Assessment:                           - Prior to the procedure, a History and Physical                            was performed, and patient medications, allergies                            and sensitivities were reviewed. The patient's                            tolerance of previous anesthesia was reviewed.                           - The risks and benefits of the procedure and the                            sedation options and risks were discussed with the                            patient. All questions were answered and informed                            consent was obtained.                           - ASA Grade Assessment: III - A patient with severe                            systemic disease.                           After obtaining informed consent, the endoscope was                            passed under direct vision. Throughout  the                            procedure, the patient's blood pressure, pulse, and                            oxygen saturations were monitored continuously. The                            [id number]-[id number] ([id number]) scope was introduced through                            the mouth, and advanced to the second part of                            duodenum. The upper GI endoscopy was accomplished                             without difficulty. The patient tolerated the                            procedure well. Scope In: 10:45:59 AM Scope Out: 11:04:19 AM Total Procedure Duration: 0 hours 18 minutes 20 seconds  Findings:      One benign-appearing, intrinsic severe (stenosis; an endoscope cannot       pass) stenosis was found 25 cm from the incisors. This stenosis measured       8 mm (inner diameter) x 2 cm (in length). The stenosis was traversed       after downsizing scope to Ultraslim pediatric and dilating. A TTS       dilator was passed through the scope. Dilation with a 04-06-11 mm       balloon dilator was performed to 11 mm. Mucosal disruption was seen upon       reinspection. Area was successfully injected with 4 mL Kenalog 10 mg/mL       for prevention of recurrent stricture, although only 2 mL could be       effectively injected given the severity of scarring and "stiffness" upon       injection.      One non-bleeding cratered gastric ulcer with a clean ulcer base (Forrest       Class III) was found on the greater curvature of the stomach. The lesion       was 5 mm in largest dimension.      The examined duodenum was normal. Impression:               - Benign-appearing esophageal stenosis. Dilated.                            Injected.                           - Non-bleeding gastric ulcer with a clean ulcer                            base (Forrest Class III).                           -  Normal examined duodenum.                           - No specimens collected. Moderate Sedation:      Per Anesthesia Care Recommendation:           - Discharge patient to home (ambulatory).                           - Advance diet as tolerated.                           - Continue open capsule lansoprazole 30 mg twice a                            day.                           - Repeat upper endoscopy in 6 weeks for                            surveillance.                           - Smoking  cessation.                           - No ibuprofen, naproxen, or other non-steroidal                            anti-inflammatory drugs. Procedure Code(s):        --- Professional ---                           [phone], Esophagogastroduodenoscopy, flexible,                            transoral; with transendoscopic balloon dilation of                            esophagus (less than 30 mm diameter) Diagnosis Code(s):        --- Professional ---                           K22.2, Esophageal obstruction                           K25.9, Gastric ulcer, unspecified as acute or                            chronic, without hemorrhage or perforation CPT copyright 2019 [employer]. All rights reserved. The codes documented in this report are preliminary and upon coder review may  be revised to meet current compliance requirements. Maylon Peppers, MD Maylon Peppers,  08/17/2021 11:47:24 AM This report has been signed electronically. Number of Addenda: 0

## 2021-08-23 ENCOUNTER — Encounter (HOSPITAL_COMMUNITY): Payer: Self-pay | Admitting: Gastroenterology

## 2021-08-28 ENCOUNTER — Encounter (HOSPITAL_COMMUNITY): Payer: Self-pay | Admitting: *Deleted

## 2021-08-28 ENCOUNTER — Emergency Department (HOSPITAL_COMMUNITY): Payer: Medicare Other

## 2021-08-28 ENCOUNTER — Other Ambulatory Visit: Payer: Self-pay

## 2021-08-28 ENCOUNTER — Inpatient Hospital Stay (HOSPITAL_COMMUNITY)
Admission: EM | Admit: 2021-08-28 | Discharge: 2021-09-24 | DRG: 308 | Disposition: E | Payer: Medicare Other | Attending: Family Medicine | Admitting: Family Medicine

## 2021-08-28 DIAGNOSIS — R443 Hallucinations, unspecified: Secondary | ICD-10-CM | POA: Diagnosis present

## 2021-08-28 DIAGNOSIS — E876 Hypokalemia: Secondary | ICD-10-CM | POA: Diagnosis not present

## 2021-08-28 DIAGNOSIS — W19XXXA Unspecified fall, initial encounter: Secondary | ICD-10-CM

## 2021-08-28 DIAGNOSIS — G8929 Other chronic pain: Secondary | ICD-10-CM | POA: Diagnosis present

## 2021-08-28 DIAGNOSIS — I4891 Unspecified atrial fibrillation: Secondary | ICD-10-CM

## 2021-08-28 DIAGNOSIS — Z681 Body mass index (BMI) 19 or less, adult: Secondary | ICD-10-CM

## 2021-08-28 DIAGNOSIS — K222 Esophageal obstruction: Secondary | ICD-10-CM | POA: Diagnosis not present

## 2021-08-28 DIAGNOSIS — F05 Delirium due to known physiological condition: Secondary | ICD-10-CM | POA: Diagnosis present

## 2021-08-28 DIAGNOSIS — E872 Acidosis, unspecified: Secondary | ICD-10-CM | POA: Diagnosis not present

## 2021-08-28 DIAGNOSIS — Z7189 Other specified counseling: Secondary | ICD-10-CM | POA: Diagnosis not present

## 2021-08-28 DIAGNOSIS — Z20822 Contact with and (suspected) exposure to covid-19: Secondary | ICD-10-CM | POA: Diagnosis present

## 2021-08-28 DIAGNOSIS — F410 Panic disorder [episodic paroxysmal anxiety] without agoraphobia: Secondary | ICD-10-CM | POA: Diagnosis present

## 2021-08-28 DIAGNOSIS — E43 Unspecified severe protein-calorie malnutrition: Secondary | ICD-10-CM | POA: Diagnosis present

## 2021-08-28 DIAGNOSIS — Z8501 Personal history of malignant neoplasm of esophagus: Secondary | ICD-10-CM | POA: Diagnosis not present

## 2021-08-28 DIAGNOSIS — F17210 Nicotine dependence, cigarettes, uncomplicated: Secondary | ICD-10-CM | POA: Diagnosis not present

## 2021-08-28 DIAGNOSIS — R34 Anuria and oliguria: Secondary | ICD-10-CM | POA: Diagnosis not present

## 2021-08-28 DIAGNOSIS — T461X5A Adverse effect of calcium-channel blockers, initial encounter: Secondary | ICD-10-CM | POA: Diagnosis not present

## 2021-08-28 DIAGNOSIS — Z515 Encounter for palliative care: Secondary | ICD-10-CM

## 2021-08-28 DIAGNOSIS — E039 Hypothyroidism, unspecified: Secondary | ICD-10-CM | POA: Diagnosis not present

## 2021-08-28 DIAGNOSIS — D539 Nutritional anemia, unspecified: Secondary | ICD-10-CM | POA: Diagnosis not present

## 2021-08-28 DIAGNOSIS — I480 Paroxysmal atrial fibrillation: Secondary | ICD-10-CM | POA: Diagnosis not present

## 2021-08-28 DIAGNOSIS — J90 Pleural effusion, not elsewhere classified: Secondary | ICD-10-CM | POA: Diagnosis not present

## 2021-08-28 DIAGNOSIS — K660 Peritoneal adhesions (postprocedural) (postinfection): Secondary | ICD-10-CM | POA: Diagnosis present

## 2021-08-28 DIAGNOSIS — J9811 Atelectasis: Secondary | ICD-10-CM | POA: Diagnosis not present

## 2021-08-28 DIAGNOSIS — F1010 Alcohol abuse, uncomplicated: Secondary | ICD-10-CM | POA: Diagnosis present

## 2021-08-28 DIAGNOSIS — Z888 Allergy status to other drugs, medicaments and biological substances status: Secondary | ICD-10-CM

## 2021-08-28 DIAGNOSIS — R627 Adult failure to thrive: Secondary | ICD-10-CM | POA: Diagnosis present

## 2021-08-28 DIAGNOSIS — Z978 Presence of other specified devices: Secondary | ICD-10-CM

## 2021-08-28 DIAGNOSIS — Z79899 Other long term (current) drug therapy: Secondary | ICD-10-CM

## 2021-08-28 DIAGNOSIS — R1310 Dysphagia, unspecified: Secondary | ICD-10-CM

## 2021-08-28 DIAGNOSIS — R0600 Dyspnea, unspecified: Secondary | ICD-10-CM | POA: Diagnosis not present

## 2021-08-28 DIAGNOSIS — K259 Gastric ulcer, unspecified as acute or chronic, without hemorrhage or perforation: Secondary | ICD-10-CM | POA: Diagnosis present

## 2021-08-28 DIAGNOSIS — I2510 Atherosclerotic heart disease of native coronary artery without angina pectoris: Secondary | ICD-10-CM | POA: Diagnosis present

## 2021-08-28 DIAGNOSIS — G9341 Metabolic encephalopathy: Secondary | ICD-10-CM | POA: Diagnosis not present

## 2021-08-28 DIAGNOSIS — A419 Sepsis, unspecified organism: Secondary | ICD-10-CM | POA: Diagnosis not present

## 2021-08-28 DIAGNOSIS — E871 Hypo-osmolality and hyponatremia: Secondary | ICD-10-CM | POA: Diagnosis present

## 2021-08-28 DIAGNOSIS — E875 Hyperkalemia: Secondary | ICD-10-CM | POA: Diagnosis not present

## 2021-08-28 DIAGNOSIS — J929 Pleural plaque without asbestos: Secondary | ICD-10-CM | POA: Diagnosis not present

## 2021-08-28 DIAGNOSIS — I10 Essential (primary) hypertension: Secondary | ICD-10-CM | POA: Diagnosis present

## 2021-08-28 DIAGNOSIS — I708 Atherosclerosis of other arteries: Secondary | ICD-10-CM | POA: Diagnosis present

## 2021-08-28 DIAGNOSIS — J449 Chronic obstructive pulmonary disease, unspecified: Secondary | ICD-10-CM | POA: Diagnosis not present

## 2021-08-28 DIAGNOSIS — G479 Sleep disorder, unspecified: Secondary | ICD-10-CM | POA: Diagnosis present

## 2021-08-28 DIAGNOSIS — Z8241 Family history of sudden cardiac death: Secondary | ICD-10-CM

## 2021-08-28 DIAGNOSIS — Z823 Family history of stroke: Secondary | ICD-10-CM

## 2021-08-28 DIAGNOSIS — Y92009 Unspecified place in unspecified non-institutional (private) residence as the place of occurrence of the external cause: Secondary | ICD-10-CM | POA: Diagnosis not present

## 2021-08-28 DIAGNOSIS — Z66 Do not resuscitate: Secondary | ICD-10-CM | POA: Diagnosis not present

## 2021-08-28 DIAGNOSIS — E878 Other disorders of electrolyte and fluid balance, not elsewhere classified: Secondary | ICD-10-CM | POA: Diagnosis not present

## 2021-08-28 DIAGNOSIS — Z8711 Personal history of peptic ulcer disease: Secondary | ICD-10-CM

## 2021-08-28 DIAGNOSIS — R112 Nausea with vomiting, unspecified: Secondary | ICD-10-CM

## 2021-08-28 DIAGNOSIS — R6521 Severe sepsis with septic shock: Secondary | ICD-10-CM | POA: Diagnosis not present

## 2021-08-28 DIAGNOSIS — R140 Abdominal distension (gaseous): Secondary | ICD-10-CM | POA: Diagnosis not present

## 2021-08-28 DIAGNOSIS — C159 Malignant neoplasm of esophagus, unspecified: Secondary | ICD-10-CM | POA: Diagnosis present

## 2021-08-28 DIAGNOSIS — N179 Acute kidney failure, unspecified: Secondary | ICD-10-CM | POA: Diagnosis not present

## 2021-08-28 DIAGNOSIS — J9601 Acute respiratory failure with hypoxia: Secondary | ICD-10-CM | POA: Diagnosis not present

## 2021-08-28 DIAGNOSIS — I739 Peripheral vascular disease, unspecified: Secondary | ICD-10-CM | POA: Diagnosis present

## 2021-08-28 DIAGNOSIS — Z7982 Long term (current) use of aspirin: Secondary | ICD-10-CM

## 2021-08-28 DIAGNOSIS — F419 Anxiety disorder, unspecified: Secondary | ICD-10-CM | POA: Diagnosis present

## 2021-08-28 DIAGNOSIS — R0602 Shortness of breath: Secondary | ICD-10-CM | POA: Diagnosis not present

## 2021-08-28 DIAGNOSIS — G9340 Encephalopathy, unspecified: Secondary | ICD-10-CM | POA: Diagnosis not present

## 2021-08-28 DIAGNOSIS — E785 Hyperlipidemia, unspecified: Secondary | ICD-10-CM | POA: Diagnosis present

## 2021-08-28 DIAGNOSIS — J439 Emphysema, unspecified: Secondary | ICD-10-CM | POA: Diagnosis not present

## 2021-08-28 DIAGNOSIS — R64 Cachexia: Secondary | ICD-10-CM | POA: Diagnosis present

## 2021-08-28 DIAGNOSIS — Z955 Presence of coronary angioplasty implant and graft: Secondary | ICD-10-CM

## 2021-08-28 DIAGNOSIS — J984 Other disorders of lung: Secondary | ICD-10-CM | POA: Diagnosis not present

## 2021-08-28 DIAGNOSIS — J690 Pneumonitis due to inhalation of food and vomit: Secondary | ICD-10-CM | POA: Diagnosis not present

## 2021-08-28 DIAGNOSIS — Z95820 Peripheral vascular angioplasty status with implants and grafts: Secondary | ICD-10-CM

## 2021-08-28 DIAGNOSIS — R54 Age-related physical debility: Secondary | ICD-10-CM | POA: Diagnosis present

## 2021-08-28 DIAGNOSIS — I952 Hypotension due to drugs: Secondary | ICD-10-CM | POA: Diagnosis not present

## 2021-08-28 DIAGNOSIS — R296 Repeated falls: Secondary | ICD-10-CM | POA: Diagnosis present

## 2021-08-28 DIAGNOSIS — R531 Weakness: Secondary | ICD-10-CM | POA: Diagnosis not present

## 2021-08-28 DIAGNOSIS — L89322 Pressure ulcer of left buttock, stage 2: Secondary | ICD-10-CM | POA: Diagnosis present

## 2021-08-28 DIAGNOSIS — Z7901 Long term (current) use of anticoagulants: Secondary | ICD-10-CM

## 2021-08-28 DIAGNOSIS — Z8249 Family history of ischemic heart disease and other diseases of the circulatory system: Secondary | ICD-10-CM

## 2021-08-28 DIAGNOSIS — Z79890 Hormone replacement therapy (postmenopausal): Secondary | ICD-10-CM

## 2021-08-28 DIAGNOSIS — D649 Anemia, unspecified: Secondary | ICD-10-CM | POA: Diagnosis present

## 2021-08-28 LAB — COMPREHENSIVE METABOLIC PANEL
ALT: 14 U/L (ref 0–44)
AST: 23 U/L (ref 15–41)
Albumin: 3.5 g/dL (ref 3.5–5.0)
Alkaline Phosphatase: 57 U/L (ref 38–126)
Anion gap: 17 — ABNORMAL HIGH (ref 5–15)
BUN: 10 mg/dL (ref 6–20)
CO2: 23 mmol/L (ref 22–32)
Calcium: 9.8 mg/dL (ref 8.9–10.3)
Chloride: 84 mmol/L — ABNORMAL LOW (ref 98–111)
Creatinine, Ser: 0.64 mg/dL (ref 0.61–1.24)
GFR, Estimated: >60 mL/min (ref >60)
Glucose, Bld: 116 mg/dL — ABNORMAL HIGH (ref 70–99)
Potassium: 3 mmol/L — ABNORMAL LOW (ref 3.5–5.1)
Sodium: 124 mmol/L — ABNORMAL LOW (ref 135–145)
Total Bilirubin: 3 mg/dL — ABNORMAL HIGH (ref 0.3–1.2)
Total Protein: 7.9 g/dL (ref 6.5–8.1)

## 2021-08-28 LAB — D-DIMER, QUANTITATIVE: D-Dimer, Quant: 1.27 ug/mL-FEU — ABNORMAL HIGH (ref 0.00–0.50)

## 2021-08-28 LAB — CBC WITH DIFFERENTIAL/PLATELET
Abs Immature Granulocytes: 0.04 10*3/uL (ref 0.00–0.07)
Basophils Absolute: 0 10*3/uL (ref 0.0–0.1)
Basophils Relative: 0 %
Eosinophils Absolute: 0.1 10*3/uL (ref 0.0–0.5)
Eosinophils Relative: 1 %
HCT: 31 % — ABNORMAL LOW (ref 39.0–52.0)
Hemoglobin: 10.5 g/dL — ABNORMAL LOW (ref 13.0–17.0)
Immature Granulocytes: 1 %
Lymphocytes Relative: 13 %
Lymphs Abs: 0.7 10*3/uL (ref 0.7–4.0)
MCH: 32.3 pg (ref 26.0–34.0)
MCHC: 33.9 g/dL (ref 30.0–36.0)
MCV: 95.4 fL (ref 80.0–100.0)
Monocytes Absolute: 0.4 10*3/uL (ref 0.1–1.0)
Monocytes Relative: 7 %
Neutro Abs: 3.9 10*3/uL (ref 1.7–7.7)
Neutrophils Relative %: 78 %
Platelets: 145 10*3/uL — ABNORMAL LOW (ref 150–400)
RBC: 3.25 MIL/uL — ABNORMAL LOW (ref 4.22–5.81)
RDW: 12.4 % (ref 11.5–15.5)
WBC: 5.1 10*3/uL (ref 4.0–10.5)
nRBC: 0 % (ref 0.0–0.2)

## 2021-08-28 LAB — URINALYSIS, ROUTINE W REFLEX MICROSCOPIC
Bilirubin Urine: NEGATIVE
Glucose, UA: NEGATIVE mg/dL
Hgb urine dipstick: NEGATIVE
Ketones, ur: 20 mg/dL — AB
Leukocytes,Ua: NEGATIVE
Nitrite: NEGATIVE
Protein, ur: NEGATIVE mg/dL
Specific Gravity, Urine: 1.005 (ref 1.005–1.030)
pH: 7 (ref 5.0–8.0)

## 2021-08-28 LAB — MAGNESIUM: Magnesium: 1.8 mg/dL (ref 1.7–2.4)

## 2021-08-28 LAB — TROPONIN I (HIGH SENSITIVITY)
Troponin I (High Sensitivity): 7 ng/L (ref <18)
Troponin I (High Sensitivity): 11 ng/L (ref <18)

## 2021-08-28 LAB — RESP PANEL BY RT-PCR (FLU A&B, COVID) ARPGX2
Influenza A by PCR: NEGATIVE
Influenza B by PCR: NEGATIVE
SARS Coronavirus 2 by RT PCR: NEGATIVE

## 2021-08-28 LAB — TSH: TSH: 4.699 u[IU]/mL — ABNORMAL HIGH (ref 0.350–4.500)

## 2021-08-28 LAB — BRAIN NATRIURETIC PEPTIDE: B Natriuretic Peptide: 105 pg/mL — ABNORMAL HIGH (ref 0.0–100.0)

## 2021-08-28 MED ORDER — DILTIAZEM LOAD VIA INFUSION
10.0000 mg | Freq: Once | INTRAVENOUS | Status: AC
  Administered 2021-08-28: 10 mg via INTRAVENOUS
  Filled 2021-08-28: qty 10

## 2021-08-28 MED ORDER — LACTATED RINGERS IV BOLUS
1000.0000 mL | Freq: Once | INTRAVENOUS | Status: AC
  Administered 2021-08-28: 1000 mL via INTRAVENOUS

## 2021-08-28 MED ORDER — IOHEXOL 350 MG/ML SOLN
75.0000 mL | Freq: Once | INTRAVENOUS | Status: AC | PRN
  Administered 2021-08-28: 75 mL via INTRAVENOUS

## 2021-08-28 MED ORDER — FENTANYL CITRATE PF 50 MCG/ML IJ SOSY
50.0000 ug | PREFILLED_SYRINGE | Freq: Once | INTRAMUSCULAR | Status: AC
  Administered 2021-08-28: 50 ug via INTRAVENOUS
  Filled 2021-08-28: qty 1

## 2021-08-28 MED ORDER — DILTIAZEM HCL-DEXTROSE 125-5 MG/125ML-% IV SOLN (PREMIX)
5.0000 mg/h | INTRAVENOUS | Status: DC
  Administered 2021-08-28: 5 mg/h via INTRAVENOUS
  Filled 2021-08-28: qty 125

## 2021-08-28 MED ORDER — POTASSIUM CHLORIDE 10 MEQ/100ML IV SOLN
10.0000 meq | INTRAVENOUS | Status: AC
  Administered 2021-08-28 (×5): 10 meq via INTRAVENOUS
  Filled 2021-08-28 (×5): qty 100

## 2021-08-28 MED ORDER — HEPARIN (PORCINE) 25000 UT/250ML-% IV SOLN
1250.0000 [IU]/h | INTRAVENOUS | Status: DC
Start: 2021-08-28 — End: 2021-08-30
  Administered 2021-08-28: 750 [IU]/h via INTRAVENOUS
  Administered 2021-08-29: 850 [IU]/h via INTRAVENOUS
  Administered 2021-08-30: 1250 [IU]/h via INTRAVENOUS
  Filled 2021-08-28 (×3): qty 250

## 2021-08-28 MED ORDER — AMIODARONE HCL IN DEXTROSE 360-4.14 MG/200ML-% IV SOLN
60.0000 mg/h | INTRAVENOUS | Status: AC
Start: 2021-08-28 — End: 2021-08-29
  Administered 2021-08-28: 60 mg/h via INTRAVENOUS
  Filled 2021-08-28 (×2): qty 200

## 2021-08-28 MED ORDER — AMIODARONE HCL IN DEXTROSE 360-4.14 MG/200ML-% IV SOLN
30.0000 mg/h | INTRAVENOUS | Status: DC
  Administered 2021-08-29 – 2021-09-02 (×7): 30 mg/h via INTRAVENOUS
  Filled 2021-08-28 (×9): qty 200

## 2021-08-28 MED ORDER — AMIODARONE LOAD VIA INFUSION
150.0000 mg | Freq: Once | INTRAVENOUS | Status: AC
  Administered 2021-08-28: 150 mg via INTRAVENOUS
  Filled 2021-08-28: qty 83.34

## 2021-08-28 MED ORDER — IPRATROPIUM-ALBUTEROL 0.5-2.5 (3) MG/3ML IN SOLN
3.0000 mL | Freq: Once | RESPIRATORY_TRACT | Status: AC
  Administered 2021-08-28: 3 mL via RESPIRATORY_TRACT
  Filled 2021-08-28: qty 3

## 2021-08-28 MED ORDER — KCL IN DEXTROSE-NACL 20-5-0.9 MEQ/L-%-% IV SOLN
INTRAVENOUS | Status: AC
  Filled 2021-08-28 (×2): qty 1000

## 2021-08-28 MED ORDER — KCL IN DEXTROSE-NACL 40-5-0.9 MEQ/L-%-% IV SOLN: INTRAVENOUS | Status: DC

## 2021-08-28 MED ORDER — IPRATROPIUM-ALBUTEROL 0.5-2.5 (3) MG/3ML IN SOLN
3.0000 mL | RESPIRATORY_TRACT | Status: DC | PRN
Start: 2021-08-28 — End: 2021-09-05
  Administered 2021-09-02 – 2021-09-03 (×4): 3 mL via RESPIRATORY_TRACT
  Filled 2021-08-28 (×6): qty 3

## 2021-08-28 MED ORDER — HEPARIN BOLUS VIA INFUSION
3000.0000 [IU] | Freq: Once | INTRAVENOUS | Status: AC
  Administered 2021-08-28: 3000 [IU] via INTRAVENOUS

## 2021-08-28 NOTE — Assessment & Plan Note (Addendum)
-  With failure to thrive.  Recurrent issue for patient,, has had multiple EGDs and dilatation related to esophageal cancer for which he had surgery in the past.  ?-Gastroenterology service has been consulted and will follow recommendations.  ?-Continue n.p.o. status for now.  ?-Continue PPI. ? ?

## 2021-08-28 NOTE — ED Notes (Signed)
Patient transported to CT 

## 2021-08-28 NOTE — ED Notes (Signed)
Performed EKG on patient due to HR at 145. EKG given to Dr. Doren Custard, awaiting orders  ?

## 2021-08-28 NOTE — ED Provider Notes (Signed)
[facility] EMERGENCY DEPARTMENT Provider Note   CSN: [id number] Arrival date & time: 08/29/2021  1503     History  Chief Complaint  Patient presents with   Shortness of Breath    Jimmy Tucker is a 61 y.o. male.   Shortness of Breath Patient presents for altered mental status.  Family called EMS due to confusion.  They also state he has had no p.o. intake.  Per chart review, medical history includes esophageal adenocarcinoma s/p esophagectomy 2016, recurrent esophageal strictures, COPD, HLD, HTN, alcohol use, anxiety, PAD.  He underwent EGD with balloon dilation 2 weeks ago.  Since that time, he has had shortness of breath and very poor p.o. intake and has not been able to tolerate even sips of water at times.  He continues to have worsening generalized weakness.  This has led to 2 different falls.  Patient does not feel that he injured anything during these falls.  Patient resents today to the ED with his daughter due to concerns of failure to thrive.    Home Medications Prior to Admission medications   Medication Sig Start Date End Date Taking? Authorizing Provider  albuterol (VENTOLIN HFA) 108 (90 Base) MCG/ACT inhaler Inhale 2 puffs into the lungs every 4 (four) hours as needed for wheezing or shortness of breath.     [provider]  ALPRAZolam Duanne Moron) 1 MG tablet Take 1 mg by mouth at bedtime as needed for anxiety or sleep. 05/22/11   [provider]  amphetamine-dextroamphetamine (ADDERALL) 5 MG tablet Take 5 mg by mouth 2 (two) times daily. Morning & 1400 03/03/21   [provider]  aspirin EC 81 MG tablet Take 1 tablet (81 mg total) by mouth daily. 03/13/21   Rehman, Mechele Dawley, MD  chlorhexidine (PERIDEX) 0.12 % solution Use as directed 15 mLs in the mouth or throat 4 (four) times daily.    [provider]  Cholecalciferol (VITAMIN D3) 50 MCG (2000 UT) CHEW Chew 4,000 Units by mouth daily.    [provider]  diphenoxylate-atropine  (LOMOTIL) 2.5-0.025 MG tablet Take 1 tablet by mouth every 6 (six) hours as needed for diarrhea or loose stools.     [provider]  lansoprazole (PREVACID) 30 MG capsule Take 1 capsule (30 mg total) by mouth 2 (two) times daily before a meal. 06/14/21   Montez Morita, Quillian Quince, MD  levothyroxine (SYNTHROID) 50 MCG tablet Take 50 mcg by mouth daily before breakfast.     [provider]  Oxycodone HCl 20 MG TABS Take 1 tablet (20 mg total) by mouth every 4 (four) hours as needed (pain). 11/11/13   Rhyne, Hulen Shouts, PA-C  propranolol (INDERAL) 20 MG tablet Take 20 mg by mouth in the morning. 03/18/21   [provider]  sucralfate (CARAFATE) 1 GM/10ML suspension Take 10 mLs (1 g total) by mouth 4 (four) times daily -  with meals and at bedtime. 04/14/21   Harvel Quale, MD  Tiotropium Bromide-Olodaterol 2.5-2.5 MCG/ACT AERS Inhale 2 puffs into the lungs daily.    [provider]      Allergies    Drug [tape], Glycopyrrolate, and Warfarin and related    Review of Systems   Review of Systems  Constitutional:  Positive for appetite change, fatigue and unexpected weight change.  HENT:  Positive for trouble swallowing.   Respiratory:  Positive for shortness of breath.   Musculoskeletal:  Positive for back pain.  Psychiatric/Behavioral:  Positive for confusion.   All  other systems reviewed and are negative.  Physical Exam Updated Vital Signs BP 117/76    Pulse 90    Temp 97.6 F (36.4 C) (Oral)    Resp 15    Ht '5\' 6"'$  (1.676 m)    Wt 46.6 kg    SpO2 (!) 88%    BMI 16.58 kg/m  Physical Exam Constitutional:      General: He is awake. He is not in acute distress.    Appearance: He is cachectic. He is ill-appearing (Chronically). He is not toxic-appearing or diaphoretic.  HENT:     Head: Normocephalic and atraumatic.     Mouth/Throat:     Mouth: Mucous membranes are dry.     Comments: Baseline hoarse voice Eyes:     Extraocular Movements:  Extraocular movements intact.  Cardiovascular:     Rate and Rhythm: Tachycardia present.  Pulmonary:     Effort: Pulmonary effort is normal. No tachypnea or accessory muscle usage.     Breath sounds: No decreased breath sounds, wheezing, rhonchi or rales.  Chest:     Chest wall: No tenderness.  Abdominal:     Palpations: Abdomen is soft.     Tenderness: There is no abdominal tenderness.  Musculoskeletal:     Right lower leg: No edema.     Left lower leg: No edema.  Skin:    General: Skin is warm and dry.     Comments: Bruising and skin breakdown to areas of back.  Diffuse bruising to forearms.  Neurological:     General: No focal deficit present.     Mental Status: He is alert and oriented to person, place, and time.     Cranial Nerves: No cranial nerve deficit.     Motor: No weakness.  Psychiatric:        Mood and Affect: Mood normal.        Behavior: Behavior normal.    ED Results / Procedures / Treatments   Labs (all labs ordered are listed, but only abnormal results are displayed) Labs Reviewed  COMPREHENSIVE METABOLIC PANEL - Abnormal; Notable for the following components:      Result Value   Sodium 124 (*)    Potassium 3.0 (*)    Chloride 84 (*)    Glucose, Bld 116 (*)    Total Bilirubin 3.0 (*)    Anion gap 17 (*)    All other components within normal limits  D-DIMER, QUANTITATIVE - Abnormal; Notable for the following components:   D-Dimer, Quant 1.27 (*)    All other components within normal limits  BRAIN NATRIURETIC PEPTIDE - Abnormal; Notable for the following components:   B Natriuretic Peptide 105.0 (*)    All other components within normal limits  URINALYSIS, ROUTINE W REFLEX MICROSCOPIC - Abnormal; Notable for the following components:   Ketones, ur 20 (*)    All other components within normal limits  CBC WITH DIFFERENTIAL/PLATELET - Abnormal; Notable for the following components:   RBC 3.25 (*)    Hemoglobin 10.5 (*)    HCT 31.0 (*)    Platelets 145  (*)    All other components within normal limits  TSH - Abnormal; Notable for the following components:   TSH 4.699 (*)    All other components within normal limits  HEPARIN LEVEL (UNFRACTIONATED) - Abnormal; Notable for the following components:   Heparin Unfractionated <0.10 (*)    All other components within normal limits  CBC - Abnormal; Notable for the following components:  RBC 3.28 (*)    Hemoglobin 10.8 (*)    HCT 30.9 (*)    Platelets 137 (*)    All other components within normal limits  HEPARIN LEVEL (UNFRACTIONATED) - Abnormal; Notable for the following components:   Heparin Unfractionated 0.28 (*)    All other components within normal limits  CBC - Abnormal; Notable for the following components:   RBC 3.09 (*)    Hemoglobin 10.6 (*)    HCT 29.3 (*)    MCH 34.3 (*)    MCHC 36.2 (*)    All other components within normal limits  BASIC METABOLIC PANEL - Abnormal; Notable for the following components:   Sodium 127 (*)    Potassium 3.1 (*)    Chloride 93 (*)    CO2 21 (*)    Glucose, Bld 156 (*)    Creatinine, Ser 0.57 (*)    All other components within normal limits  PHOSPHORUS - Abnormal; Notable for the following components:   Phosphorus 1.5 (*)    All other components within normal limits  RESP PANEL BY RT-PCR (FLU A&B, COVID) ARPGX2  MRSA NEXT GEN BY PCR, NASAL  MAGNESIUM  HEPARIN LEVEL (UNFRACTIONATED)  CBC WITH DIFFERENTIAL/PLATELET  CBC  HEPARIN LEVEL (UNFRACTIONATED)  BASIC METABOLIC PANEL  MAGNESIUM  AMMONIA  VITAMIN B12  TROPONIN I (HIGH SENSITIVITY)  TROPONIN I (HIGH SENSITIVITY)    EKG EKG Interpretation  Date/Time:  Sunday August 28 2021 17:33:18 EST Ventricular Rate:  165 PR Interval:    QRS Duration: 80 QT Interval:  265 QTC Calculation: 439 R Axis:   67 Text Interpretation: Atrial fibrillation with rapid V-rate Repolarization abnormality, prob rate related 12 Lead; Mason-Likar Confirmed by Godfrey Pick [phone]) on 08/24/2021 5:36:44  PM  Radiology CT Head Wo Contrast  Result Date: 08/25/2021 CLINICAL DATA:  Altered mental status. EXAM: CT HEAD WITHOUT CONTRAST TECHNIQUE: Contiguous axial images were obtained from the base of the skull through the vertex without intravenous contrast. RADIATION DOSE REDUCTION: This exam was performed according to the departmental dose-optimization program which includes automated exposure control, adjustment of the mA and/or kV according to patient size and/or use of iterative reconstruction technique. COMPARISON:  February 11, 2007 FINDINGS: Brain: There is mild to moderate severity cerebral atrophy with widening of the extra-axial spaces and ventricular dilatation. There are areas of decreased attenuation within the white matter tracts of the supratentorial brain, consistent with microvascular disease changes. Vascular: No hyperdense vessel or unexpected calcification. Skull: Negative for an acute fracture. A chronic fracture of the left zygomatic arch is seen. Sinuses/Orbits: No acute finding. Other: None. IMPRESSION: Generalized cerebral atrophy without evidence of an acute intracranial abnormality. Electronically Signed   By: Virgina Norfolk M.D.   On: 09/21/2021 20:29   CT Angio Chest PE W and/or Wo Contrast  Result Date: 08/26/2021 CLINICAL DATA:  PE suspected, shortness of breath, confusion, history of esophageal cancer EXAM: CT ANGIOGRAPHY CHEST WITH CONTRAST TECHNIQUE: Multidetector CT imaging of the chest was performed using the standard protocol during bolus administration of intravenous contrast. Multiplanar CT image reconstructions and MIPs were obtained to evaluate the vascular anatomy. RADIATION DOSE REDUCTION: This exam was performed according to the departmental dose-optimization program which includes automated exposure control, adjustment of the mA and/or kV according to patient size and/or use of iterative reconstruction technique. CONTRAST:  27m OMNIPAQUE IOHEXOL 350 MG/ML SOLN  COMPARISON:  10/20/2014 FINDINGS: Cardiovascular: Satisfactory opacification of the pulmonary arteries to the segmental level. No evidence of pulmonary embolism. Normal  heart size. Extensive three-vessel coronary artery calcifications and/or stents. No pericardial effusion. Aortic atherosclerosis Mediastinum/Nodes: No enlarged mediastinal, hilar, or axillary lymph nodes. Status post pull-through esophagectomy. Thyroid gland and trachea demonstrate no significant findings. Lungs/Pleura: Mild, diffuse bilateral bronchial wall thickening. Bandlike scarring of the bilateral lung bases. No pleural effusion or pneumothorax. Upper Abdomen: No acute abnormality. Musculoskeletal: No chest wall abnormality. No acute osseous findings. Review of the MIP images confirms the above findings. IMPRESSION: 1. Negative examination for pulmonary embolism. 2. Mild, diffuse bilateral bronchial wall thickening, consistent with nonspecific infectious or inflammatory bronchitis. 3. Status post pull-through esophagectomy. No evidence of malignant recurrence in the chest. 4. Coronary artery disease. Aortic Atherosclerosis (ICD10-I70.0). Electronically Signed   By: Delanna Ahmadi M.D.   On: 09/16/2021 20:34   DG Chest Port 1 View  Result Date: 08/27/2021 CLINICAL DATA:  shortness of breath EXAM: PORTABLE CHEST 1 VIEW COMPARISON:  March 05, 2021 FINDINGS: The cardiomediastinal silhouette is unchanged in contour.Atherosclerotic calcifications of the aorta. Unchanged blunting of the RIGHT costophrenic angle. No large pleural effusion. No pneumothorax. No acute pleuroparenchymal abnormality. Visualized abdomen is unremarkable. IMPRESSION: No acute cardiopulmonary abnormality. Electronically Signed   By: Valentino Saxon M.D.   On: 09/04/2021 16:25   ECHOCARDIOGRAM COMPLETE  Result Date: 08/29/2021    ECHOCARDIOGRAM REPORT   Patient Name:   Maalik T Tucker Date of Exam: 08/29/2021 Medical Rec #:  [id number]     Height:       66.0 in Accession  #:    [id number]    Weight:       110.0 lb Date of Birth:  06/13/1961     BSA:          1.551 m Patient Age:    69 years      BP:           153/95 mmHg Patient Gender: M             HR:           100 bpm. Exam Location:  [name] Procedure: 2D Echo, Cardiac Doppler and Color Doppler Indications:    Atrial Fibrillation  History:        Patient has no prior history of Echocardiogram examinations.                 CAD, PAD, Arrythmias:Atrial Fibrillation; Risk                 Factors:Hypertension, Dyslipidemia and Current Smoker. Alcohol                 abuse.  Sonographer:    Wenda Low Referring Phys: [phone] Leanne Chang [facility]  Sonographer Comments: Technically difficult study due to poor echo windows. Image acquisition challenging due to patient body habitus. IMPRESSIONS  1. Patient in NSR during exam . Left ventricular ejection fraction, by estimation, is 60 to 65%. The left ventricle has normal function. The left ventricle has no regional wall motion abnormalities. There is mild left ventricular hypertrophy. Left ventricular diastolic parameters are consistent with Grade I diastolic dysfunction (impaired relaxation).  2. Right ventricular systolic function is normal. The right ventricular size is normal. There is severely elevated pulmonary artery systolic pressure.  3. The mitral valve is abnormal. Trivial mitral valve regurgitation. No evidence of mitral stenosis.  4. The aortic valve is tricuspid. There is mild calcification of the aortic valve. There is mild thickening of the aortic valve. Aortic valve regurgitation is not visualized. Aortic valve sclerosis is  present, with no evidence of aortic valve stenosis.  5. The inferior vena cava is normal in size with greater than 50% respiratory variability, suggesting right atrial pressure of 3 mmHg. FINDINGS  Left Ventricle: Patient in NSR during exam. Left ventricular ejection fraction, by estimation, is 60 to 65%. The left ventricle has normal function.  The left ventricle has no regional wall motion abnormalities. The left ventricular internal cavity size was normal in size. There is mild left ventricular hypertrophy. Left ventricular diastolic parameters are consistent with Grade I diastolic dysfunction (impaired relaxation). Right Ventricle: The right ventricular size is normal. No increase in right ventricular wall thickness. Right ventricular systolic function is normal. There is severely elevated pulmonary artery systolic pressure. The tricuspid regurgitant velocity is 4.14 m/s, and with an assumed right atrial pressure of 8 mmHg, the estimated right ventricular systolic pressure is 04.8 mmHg. Left Atrium: Left atrial size was normal in size. Right Atrium: Right atrial size was normal in size. Pericardium: There is no evidence of pericardial effusion. Mitral Valve: The mitral valve is abnormal. There is mild thickening of the mitral valve leaflet(s). There is mild calcification of the mitral valve leaflet(s). Mild mitral annular calcification. Trivial mitral valve regurgitation. No evidence of mitral valve stenosis. MV peak gradient, 3.5 mmHg. The mean mitral valve gradient is 2.0 mmHg. Tricuspid Valve: The tricuspid valve is normal in structure. Tricuspid valve regurgitation is not demonstrated. No evidence of tricuspid stenosis. Aortic Valve: The aortic valve is tricuspid. There is mild calcification of the aortic valve. There is mild thickening of the aortic valve. Aortic valve regurgitation is not visualized. Aortic valve sclerosis is present, with no evidence of aortic valve stenosis. Aortic valve mean gradient measures 1.0 mmHg. Aortic valve peak gradient measures 3.5 mmHg. Aortic valve area, by VTI measures 2.40 cm. Pulmonic Valve: The pulmonic valve was normal in structure. Pulmonic valve regurgitation is not visualized. No evidence of pulmonic stenosis. Aorta: The aortic root is normal in size and structure. Venous: The inferior vena cava is normal  in size with greater than 50% respiratory variability, suggesting right atrial pressure of 3 mmHg. IAS/Shunts: The interatrial septum was not well visualized.  LEFT VENTRICLE PLAX 2D LVIDd:         3.40 cm   Diastology LVIDs:         2.30 cm   LV e' medial:    7.51 cm/s LV PW:         1.20 cm   LV E/e' medial:  8.8 LV IVS:        1.20 cm   LV e' lateral:   7.29 cm/s LVOT diam:     1.90 cm   LV E/e' lateral: 9.1 LV SV:         41 LV SV Index:   27 LVOT Area:     2.84 cm  RIGHT VENTRICLE RV Basal diam:  2.95 cm RV Mid diam:    3.00 cm RV S prime:     14.00 cm/s TAPSE (M-mode): 2.3 cm LEFT ATRIUM         Index       RIGHT ATRIUM           Index LA diam:    2.90 cm 1.87 cm/m  RA Area:     10.50 cm  RA Volume:   18.00 ml  11.60 ml/m  AORTIC VALVE                    PULMONIC VALVE AV Area (Vmax):    2.38 cm     PV Vmax:       0.77 m/s AV Area (Vmean):   2.55 cm     PV Peak grad:  2.4 mmHg AV Area (VTI):     2.40 cm AV Vmax:           94.00 cm/s AV Vmean:          53.100 cm/s AV VTI:            0.171 m AV Peak Grad:      3.5 mmHg AV Mean Grad:      1.0 mmHg LVOT Vmax:         79.00 cm/s LVOT Vmean:        47.800 cm/s LVOT VTI:          0.145 m LVOT/AV VTI ratio: 0.85  AORTA Ao Root diam: 3.30 cm MITRAL VALVE               TRICUSPID VALVE MV Area (PHT): 3.97 cm    TR Peak grad:   68.6 mmHg MV Area VTI:   1.83 cm    TR Vmax:        414.00 cm/s MV Peak grad:  3.5 mmHg MV Mean grad:  2.0 mmHg    SHUNTS MV Vmax:       0.93 m/s    Systemic VTI:  0.14 m MV Vmean:      62.7 cm/s   Systemic Diam: 1.90 cm MV Decel Time: 191 msec MV E velocity: 66.00 cm/s MV A velocity: 82.30 cm/s MV E/A ratio:  0.80 Jenkins Rouge MD Electronically signed by Jenkins Rouge MD Signature Date/Time: 08/29/2021/12:17:46 PM    Final     Procedures Procedures    Medications Ordered in ED Medications  amiodarone (NEXTERONE) 1.8 mg/mL load via infusion 150 mg (150 mg Intravenous Bolus from Bag 09/12/2021 1930)     Followed by  amiodarone (NEXTERONE PREMIX) 360-4.14 MG/200ML-% (1.8 mg/mL) IV infusion (0 mg/hr Intravenous Stopped 08/29/21 0130)    Followed by  amiodarone (NEXTERONE PREMIX) 360-4.14 MG/200ML-% (1.8 mg/mL) IV infusion (30 mg/hr Intravenous Bolus from Bag 08/29/21 1951)  heparin bolus via infusion 3,000 Units (3,000 Units Intravenous Bolus from Bag 09/06/2021 2031)    Followed by  heparin ADULT infusion 100 units/mL (25000 units/225m) (850 Units/hr Intravenous New Bag/Given 08/29/21 1949)  acetaminophen (TYLENOL) tablet 650 mg (has no administration in time range)    Or  acetaminophen (TYLENOL) suppository 650 mg (has no administration in time range)  ondansetron (ZOFRAN) tablet 4 mg (has no administration in time range)    Or  ondansetron (ZOFRAN) injection 4 mg (has no administration in time range)  polyethylene glycol (MIRALAX / GLYCOLAX) packet 17 g (has no administration in time range)  ipratropium-albuterol (DUONEB) 0.5-2.5 (3) MG/3ML nebulizer solution 3 mL (has no administration in time range)  dextrose 5 % and 0.9 % NaCl with KCl 20 mEq/L infusion ( Intravenous Infusion Verify 08/29/21 1500)  Chlorhexidine Gluconate Cloth 2 % PADS 6 each (6 each Topical Given 08/29/21 1117)  metoprolol tartrate (LOPRESSOR) injection 2.5 mg (2.5 mg Intravenous Given 08/29/21 2014)  pantoprazole (PROTONIX) injection 40 mg (40 mg Intravenous Given 08/29/21 2015)  chlorhexidine (PERIDEX) 0.12 % solution 15 mL (15 mLs Mouth Rinse  Given 08/29/21 2009)  MEDLINE mouth rinse (15 mLs Mouth Rinse Not Given 08/29/21 1502)  hydrALAZINE (APRESOLINE) injection 10 mg (10 mg Intravenous Given 08/29/21 1722)  potassium PHOSPHATE 30 mmol in dextrose 5 % 500 mL infusion (30 mmol Intravenous New Bag/Given 08/29/21 2200)  lactated ringers bolus 1,000 mL (0 mLs Intravenous Stopped 09/01/2021 1852)  diltiazem (CARDIZEM) 1 mg/mL load via infusion 10 mg (10 mg Intravenous Bolus from Bag 09/23/2021 1832)  fentaNYL (SUBLIMAZE) injection 50 mcg (50 mcg  Intravenous Given 09/23/2021 1825)  potassium chloride 10 mEq in 100 mL IVPB (0 mEq Intravenous Stopped 09/22/2021 2345)  iohexol (OMNIPAQUE) 350 MG/ML injection 75 mL (75 mLs Intravenous Contrast Given 09/23/2021 2004)  ipratropium-albuterol (DUONEB) 0.5-2.5 (3) MG/3ML nebulizer solution 3 mL (3 mLs Nebulization Given 08/26/2021 2357)  heparin bolus via infusion 2,000 Units (2,000 Units Intravenous Bolus from Bag 08/29/21 0314)  heparin bolus via infusion 1,000 Units (1,000 Units Intravenous Bolus from Bag 08/29/21 1154)  morphine (PF) 2 MG/ML injection 2 mg (2 mg Intravenous Given 08/29/21 1944)  LORazepam (ATIVAN) injection 1 mg (1 mg Intravenous Given 08/29/21 2222)    ED Course/ Medical Decision Making/ A&P                           Medical Decision Making Amount and/or Complexity of Data Reviewed Labs: ordered. Radiology: ordered.  Risk Prescription drug management. Decision regarding hospitalization.   This patient presents to the ED for concern of failure to thrive, this involves an extensive number of treatment options, and is a complaint that carries with it a high risk of complications and morbidity.  The differential diagnosis includes dehydration, malnutrition, bacterial infection, viral illness, metabolic derangements, depression   Co morbidities that complicate the patient evaluation  esophageal adenocarcinoma s/p esophagectomy 2016, recurrent esophageal strictures, COPD, HLD, HTN, alcohol use, anxiety, PAD   Additional history obtained:  Additional history obtained from patient's daughter External records from outside source obtained and reviewed including EMR   Lab Tests:  I Ordered, and personally interpreted labs.  The pertinent results include: Hyponatremia, hypokalemia, decreased hemoglobin from 5 months ago, no leukocytosis, mildly elevated D-dimer.   Imaging Studies ordered:  I ordered imaging studies including chest x-ray, CT head, CTA chest I independently visualized and  interpreted imaging which showed no acute findings I agree with the radiologist interpretation   Cardiac Monitoring:  The patient was maintained on a cardiac monitor.  I personally viewed and interpreted the cardiac monitored which showed an underlying rhythm of: Atrial fibrillation with RVR   Medicines ordered and prescription drug management:  I ordered medication including IV fluids for decreased p.o. intake; potassium chloride for hypokalemia, Cardizem for rate control; amiodarone for rate/rhythm control in setting of hypotension; heparin for atrial fibrillation Reevaluation of the patient after these medicines showed that the patient improved I have reviewed the patients home medicines and have made adjustments as needed   Critical Interventions:  Rate control for A-fib with RVR, consultation with cardiology, admission to hospital   Consultations Obtained:  I requested consultation with the cardiologist,  and discussed lab and imaging findings as well as pertinent plan - they recommend: Amiodarone and heparin load and gtt., further, cardiology consultation in the morning   Problem List / ED Course:  Patient arrives from home with his daughter due to concerns of failure to thrive.  He also reports that he has felt short of breath and his family has had concerns of  confusion.  Patient underwent an EGD with balloon dilation of his esophagus 2 weeks ago.  Since that time, he has had very poor p.o. intake.  Patient is cachectic on exam.  He has diffuse bruising to his upper extremities and areas of his back.  His vital signs are notable for tachycardia.  Given his very limited p.o. intake, bolus of IV fluids was ordered.  Laboratory work-up was initiated to assess for underlying metabolic and/or infectious causes contributing to his recent symptoms.  Patient was bedded in the emergency department and placed on bedside cardiac monitor.  At this time, he is found to be in atrial  fibrillation with RVR.  This was confirmed on EKG.  Patient is not currently on any AV nodal agents or any blood thinners.  10 mg of diltiazem was given and patient was started on a diltiazem gtt. at 5 mg/h.  After this, he was found to have decreased blood pressures.  Diltiazem gtt. was held.  Cardiology was consulted who agrees with amiodarone bolus and drip.  Patient was also started on heparin gtt.  Diagnostic work-up was notable for hypokalemia and this was replaced with IV potassium chloride due to his difficulty swallowing.  Other notable findings were hyponatremia.  His D-dimer was mildly elevated which prompted a CTA of chest.  No acute findings were noted on imaging.  Patient does have bronchial wall thickening, the acuity of which is unknown.  Following amiodarone, patient did have normalization of heart rate.  His blood pressures remained stable.  Patient was admitted to hospitalist for further management.    Social Determinants of Health:  Chronically ill, difficulty eating and drinking  CRITICAL CARE Performed by: Godfrey Pick   Total critical care time: 40 minutes  Critical care time was exclusive of separately billable procedures and treating other patients.  Critical care was necessary to treat or prevent imminent or life-threatening deterioration.  Critical care was time spent personally by me on the following activities: development of treatment plan with patient and/or surrogate as well as nursing, discussions with consultants, evaluation of patient's response to treatment, examination of patient, obtaining history from patient or surrogate, ordering and performing treatments and interventions, ordering and review of laboratory studies, ordering and review of radiographic studies, pulse oximetry and re-evaluation of patient's condition.          Final Clinical Impression(s) / ED Diagnoses Final diagnoses:  Atrial fibrillation with rapid ventricular response ([place])   Hypokalemia    Rx / DC Orders ED Discharge Orders          Ordered    Amb referral to [facility]        09/03/2021 1754              Godfrey Pick, MD 09/01/2021 [phone]

## 2021-08-28 NOTE — H&P (Signed)
History and Physical    Jimmy Tucker WUJ:[id number] DOB: [birth year] DOA: 09/13/2021  PCP: [name], MD   Patient coming from: Home  I have personally briefly reviewed patient's old medical records in [place]  Chief Complaint: Weakness  HPI: Jimmy Tucker is a 61 y.o. male with medical history significant for esophageal cancer, hypertension, coronary artery disease, atrial fibrillation, COPD, alcohol abuse. Patient was brought to the ED from home with reports of multiple complaints.  Family-spouse and daughter are present at bedside and assist with the history.  They report confusion today.  But over the past several days, since patient had his esophageal dilatation 2/22, he reports poor oral intake, has barely eaten, neurolysed weakness, patient unable to stand over the past few days, cannot walk.  He reports feeling dizzy a week ago, resulting in a fall twice that day.  Reports a feeling of panic attack today with transient difficulty breathing.  But denies specific palpitations.  He denies chest pain.  ED Course: Tachycardic, heart rate up to 165 on EKG.  Temperature 97.6.  Respiratory rate 17-32.  Blood pressure systolic initially 956/21.  Sodium 124.  Potassium 3.  Magnesium 1.8.  TSH 4.699.  Troponin 7 > 11 EKG shows atrial fibrillation with RVR.  He was given Cardizem with subsequent drop in blood pressure to 74/64.  1 L bolus was given.  Cardizem discontinued.  ED provider talked to cardiology, okay with amiodarone and heparin drip.  And cardiology team to see in the morning.  Review of Systems: As per HPI all other systems reviewed and negative.  Past Medical History:  Diagnosis Date   Abnormal nuclear stress test    Anxiety    Cancer (HCC)    Carotid artery disease (HCC)    Chronic back pain    Chronic lower back pain    "L4-5"   COPD (chronic obstructive pulmonary disease) (HCC)    Esophageal cancer (HCC)    Heavy cigarette smoker (20-39 per day)     Unmotivated   Hyperlipidemia LDL goal <70    Intolerant to statins   Hypertension    Lumbar disc disease    PAD (peripheral artery disease) ([place]) 10/30/2012   a) 2011 LEA Dopplers: Left ATA occlusion; b) 10/2013: LEA Dopplers - L Common Iliac & CFA occlusion - w/ short reconstitution at the branch point of the external and internal iliac, the external iliac is occluded through the common femoral and proximal SFA; reconstitutes in the proximal SFA. Two-vessel runoff beyond.; c) s/p LCIA Stent & CFA EA w/ patch angioplasty --> f/u doppler 12/01/2013 patent   Pollen allergies    Presence of stent in artery     Past Surgical History:  Procedure Laterality Date   BACK SURGERY  02/2008   BALLOON DILATION  08/17/2021   Procedure: BALLOON DILATION;  Surgeon: Harvel Quale, MD;  [facility];  Service: Gastroenterology;;   BIOPSY  04/12/2021   Procedure: BIOPSY;  Surgeon: Harvel Quale, MD;  [facility];  Service: Gastroenterology;;   CARDIAC CATHETERIZATION N/A 02/15/2015   Procedure: Left Heart Cath and Coronary Angiography;  Surgeon: Lorretta Harp, MD;  Location: [facility];  Service: Cardiovascular;  Laterality: N/A;   COLONOSCOPY  07/24/2011   Procedure: COLONOSCOPY;  Surgeon: Daneil Dolin, MD;  [facility];  Service: Endoscopy;  Laterality: N/A;  8:15   CORONARY STENT PLACEMENT     ENDARTERECTOMY FEMORAL Left 11/11/2013  Procedure: Left Femoral Endarterectomy with Patch Angioplasty ;  Surgeon: Angelia Mould, MD;  Location: [place];  Service: Vascular;  Laterality: Left;   ESOPHAGEAL DILATION N/A 08/29/2019   Procedure: ESOPHAGEAL DILATION;  Surgeon: Rogene Houston, MD;  [facility];  Service: Endoscopy;  Laterality: N/A;   ESOPHAGEAL DILATION N/A 03/10/2021   Procedure: ESOPHAGEAL DILATION;  Surgeon: Rogene Houston, MD;  [facility];  Service: Endoscopy;  Laterality: N/A;   ESOPHAGEAL DILATION   06/14/2021   Procedure: ESOPHAGEAL DILATION;  Surgeon: Harvel Quale, MD;  [facility];  Service: Gastroenterology;;   ESOPHAGOGASTRODUODENOSCOPY N/A 10/09/2014   Procedure: ESOPHAGOGASTRODUODENOSCOPY (EGD);  Surgeon: Rogene Houston, MD;  [facility];  Service: Endoscopy;  Laterality: N/A;  230 - moved to 9:55 - Ann notified pt to arrive at 8:55   ESOPHAGOGASTRODUODENOSCOPY (EGD) WITH PROPOFOL N/A 07/21/2019   Procedure: ESOPHAGOGASTRODUODENOSCOPY (EGD) WITH PROPOFOL;  Surgeon: Rogene Houston, MD;  [facility];  Service: Endoscopy;  Laterality: N/A;  11:55   ESOPHAGOGASTRODUODENOSCOPY (EGD) WITH PROPOFOL N/A 08/29/2019   Procedure: ESOPHAGOGASTRODUODENOSCOPY (EGD) WITH PROPOFOL;  Surgeon: Rogene Houston, MD;  [facility];  Service: Endoscopy;  Laterality: N/A;  730   ESOPHAGOGASTRODUODENOSCOPY (EGD) WITH PROPOFOL N/A 03/10/2021   Procedure: ESOPHAGOGASTRODUODENOSCOPY (EGD) WITH PROPOFOL;  Surgeon: Rogene Houston, MD;  [facility];  Service: Endoscopy;  Laterality: N/A;  1:20 / Following Dr Gala Romney in Room 3 at the end Per Dr Laural Golden - per office ok'd by Dr. Gala Romney   ESOPHAGOGASTRODUODENOSCOPY (EGD) WITH PROPOFOL N/A 04/12/2021   Procedure: ESOPHAGOGASTRODUODENOSCOPY (EGD) WITH PROPOFOL;  Surgeon: Harvel Quale, MD;  [facility];  Service: Gastroenterology;  Laterality: N/A;  8:30   ESOPHAGOGASTRODUODENOSCOPY (EGD) WITH PROPOFOL N/A 06/14/2021   Procedure: ESOPHAGOGASTRODUODENOSCOPY (EGD) WITH PROPOFOL;  Surgeon: Harvel Quale, MD;  [facility];  Service: Gastroenterology;  Laterality: N/A;  7:30   ESOPHAGOGASTRODUODENOSCOPY (EGD) WITH PROPOFOL N/A 08/17/2021   Procedure: ESOPHAGOGASTRODUODENOSCOPY (EGD) WITH PROPOFOL;  Surgeon: Harvel Quale, MD;  [facility];  Service: Gastroenterology;  Laterality: N/A;  [place] STENT Left 11/03/2013   Dr. Judithann Sauger    IR FLUORO GUIDED NEEDLE [place] ASPIRATION/INJECTION LOC  08/03/2018   KENALOG INJECTION  08/17/2021   Procedure: KENALOG INJECTION;  Surgeon: Harvel Quale, MD;  [facility];  Service: Gastroenterology;;   LOWER EXTREMITY ANGIOGRAM N/A 11/03/2013   Procedure: LOWER EXTREMITY ANGIOGRAM;  Surgeon: Lorretta Harp, MD;  [facility]: [facility];  Service: Cardiovascular;  Laterality: N/A;   LUMBAR MICRODISCECTOMY Left 02/2008   L4-5   NM MYOCAR PERF WALL MOTION  07/2009   persantine - normal perfusion   SURGERY SCROTAL / TESTICULAR Left    cyst excision   THORACIC ESOPHAGUS REPLACEMENT       reports that he has been smoking cigarettes. He has a 20.00 pack-year smoking history. He has never used smokeless tobacco. He reports current alcohol use of about 21.0 standard drinks per week. He reports that he does not use drugs.  Allergies  Allergen Reactions   Drug [Tape]     PLEASE USE COBAN ,,,, ADHESIVE TAPE TEARS SKIN    Glycopyrrolate Other (See Comments)    Intolerance--unknown reaction   Warfarin And Related Other (See Comments)    Excessive bleeding    Family History  Problem Relation Age of Onset   Heart attack Father 21  Cardiac arrest   Sudden death Father    Stroke Brother 38   Stroke Mother    Colon cancer Neg Hx     Prior to Admission medications   Medication Sig Start Date End Date Taking? Authorizing Provider  albuterol (VENTOLIN HFA) 108 (90 Base) MCG/ACT inhaler Inhale 2 puffs into the lungs every 4 (four) hours as needed for wheezing or shortness of breath.     [provider]  ALPRAZolam Duanne Moron) 1 MG tablet Take 1 mg by mouth at bedtime as needed for anxiety or sleep. 05/22/11   [provider]  amphetamine-dextroamphetamine (ADDERALL) 5 MG tablet Take 5 mg by mouth 2 (two) times daily. Morning & 1400 03/03/21   [provider]  aspirin EC 81 MG tablet Take 1 tablet (81 mg total) by mouth daily. 03/13/21   Rehman, Mechele Dawley,  MD  chlorhexidine (PERIDEX) 0.12 % solution Use as directed 15 mLs in the mouth or throat 4 (four) times daily.    [provider]  Cholecalciferol (VITAMIN D3) 50 MCG (2000 UT) CHEW Chew 4,000 Units by mouth daily.    [provider]  diphenoxylate-atropine (LOMOTIL) 2.5-0.025 MG tablet Take 1 tablet by mouth every 6 (six) hours as needed for diarrhea or loose stools.     [provider]  lansoprazole (PREVACID) 30 MG capsule Take 1 capsule (30 mg total) by mouth 2 (two) times daily before a meal. 06/14/21   Montez Morita, Quillian Quince, MD  levothyroxine (SYNTHROID) 50 MCG tablet Take 50 mcg by mouth daily before breakfast.     [provider]  Oxycodone HCl 20 MG TABS Take 1 tablet (20 mg total) by mouth every 4 (four) hours as needed (pain). 11/11/13   Rhyne, Hulen Shouts, PA-C  propranolol (INDERAL) 20 MG tablet Take 20 mg by mouth in the morning. 03/18/21   [provider]  sucralfate (CARAFATE) 1 GM/10ML suspension Take 10 mLs (1 g total) by mouth 4 (four) times daily -  with meals and at bedtime. 04/14/21   Harvel Quale, MD  Tiotropium Bromide-Olodaterol 2.5-2.5 MCG/ACT AERS Inhale 2 puffs into the lungs daily.    [provider]    Physical Exam: Vitals:   09/11/2021 2145 09/22/2021 2200 09/06/2021 2215 09/22/2021 2230  BP:  (!) 171/86  (!) 153/95  Pulse: 87 86 87 89  Resp: '19 16 17 18  '$ Temp:      TempSrc:      SpO2: 100% 100% 100% 100%  Weight:      Height:        Constitutional: Cachectic, acutely and chronically ill-appearing, calm, comfortable Vitals:   08/26/2021 2145 09/17/2021 2200 09/14/2021 2215 09/21/2021 2230  BP:  (!) 171/86  (!) 153/95  Pulse: 87 86 87 89  Resp: '19 16 17 18  '$ Temp:      TempSrc:      SpO2: 100% 100% 100% 100%  Weight:      Height:       Eyes: PERRL, lids and conjunctivae normal ENMT: Mucous membranes are dry.  Neck: normal, supple, no masses, no thyromegaly Respiratory: clear to auscultation  bilaterally, no wheezing, no crackles. Normal respiratory effort. No accessory muscle use.  Cardiovascular: Regular rate and rhythm, no murmurs / rubs / gallops. No extremity edema.  Lower extremities warm. Abdomen: no tenderness, no masses palpated. No hepatosplenomegaly. Bowel sounds positive.  Musculoskeletal: no clubbing / cyanosis. No joint deformity upper and lower extremities. Good ROM, no contractures. Normal muscle tone.  Skin: no rashes, lesions, ulcers. No induration Neurologic: No apparent cranial nerve abnormality, moving extremities spontaneously.  Psychiatric: Awake and alert oriented to person place time and situation.  Speech occasionally confused.  Labs on Admission: I have personally reviewed following labs and imaging studies  CBC: Recent Labs  Lab 08/31/2021 1652  WBC 5.1  NEUTROABS 3.9  HGB 10.5*  HCT 31.0*  MCV 95.4  PLT 937*   Basic Metabolic Panel: Recent Labs  Lab 09/22/2021 1615  NA 124*  K 3.0*  CL 84*  CO2 23  GLUCOSE 116*  BUN 10  CREATININE 0.64  CALCIUM 9.8  MG 1.8   GFR: Estimated Creatinine Clearance: 69.3 mL/min (by C-G formula based on SCr of 0.64 mg/dL). Liver Function Tests: Recent Labs  Lab 09/12/2021 1615  AST 23  ALT 14  ALKPHOS 57  BILITOT 3.0*  PROT 7.9  ALBUMIN 3.5   Thyroid Function Tests: Recent Labs    09/16/2021 1803  TSH 4.699*   Anemia Panel: No results for input(s): VITAMINB12, FOLATE, FERRITIN, TIBC, IRON, RETICCTPCT in the last 72 hours. Urine analysis:    Component Value Date/Time   COLORURINE YELLOW 08/31/2021 1546   APPEARANCEUR CLEAR 08/25/2021 1546   LABSPEC 1.005 09/15/2021 1546   PHURINE 7.0 09/03/2021 1546   GLUCOSEU NEGATIVE 08/29/2021 1546   HGBUR NEGATIVE 08/26/2021 1546   BILIRUBINUR NEGATIVE 09/15/2021 1546   KETONESUR 20 (A) 08/31/2021 1546   PROTEINUR NEGATIVE 08/25/2021 1546   UROBILINOGEN 1.0 11/11/2013 0618   NITRITE NEGATIVE 09/17/2021 1546   LEUKOCYTESUR NEGATIVE 09/20/2021 1546     Radiological Exams on Admission: CT Head Wo Contrast  Result Date: 09/13/2021 CLINICAL DATA:  Altered mental status. EXAM: CT HEAD WITHOUT CONTRAST TECHNIQUE: Contiguous axial images were obtained from the base of the skull through the vertex without intravenous contrast. RADIATION DOSE REDUCTION: This exam was performed according to the departmental dose-optimization program which includes automated exposure control, adjustment of the mA and/or kV according to patient size and/or use of iterative reconstruction technique. COMPARISON:  February 11, 2007 FINDINGS: Brain: There is mild to moderate severity cerebral atrophy with widening of the extra-axial spaces and ventricular dilatation. There are areas of decreased attenuation within the white matter tracts of the supratentorial brain, consistent with microvascular disease changes. Vascular: No hyperdense vessel or unexpected calcification. Skull: Negative for an acute fracture. A chronic fracture of the left zygomatic arch is seen. Sinuses/Orbits: No acute finding. Other: None. IMPRESSION: Generalized cerebral atrophy without evidence of an acute intracranial abnormality. Electronically Signed   By: Virgina Norfolk M.D.   On: 08/26/2021 20:29   CT Angio Chest PE W and/or Wo Contrast  Result Date: 09/12/2021 CLINICAL DATA:  PE suspected, shortness of breath, confusion, history of esophageal cancer EXAM: CT ANGIOGRAPHY CHEST WITH CONTRAST TECHNIQUE: Multidetector CT imaging of the chest was performed using the standard protocol during bolus administration of intravenous contrast. Multiplanar CT image reconstructions and MIPs were obtained to evaluate the vascular anatomy. RADIATION DOSE REDUCTION: This exam was performed according to the departmental dose-optimization program which includes automated exposure control, adjustment of the mA and/or kV according to patient size and/or use of iterative reconstruction technique. CONTRAST:  37m OMNIPAQUE  IOHEXOL 350 MG/ML SOLN COMPARISON:  10/20/2014 FINDINGS: Cardiovascular: Satisfactory opacification of the pulmonary arteries to the segmental level. No evidence of pulmonary embolism. Normal heart size. Extensive three-vessel coronary artery calcifications and/or stents. No pericardial effusion. Aortic atherosclerosis Mediastinum/Nodes: No enlarged mediastinal, hilar, or axillary lymph nodes. Status post pull-through  esophagectomy. Thyroid gland and trachea demonstrate no significant findings. Lungs/Pleura: Mild, diffuse bilateral bronchial wall thickening. Bandlike scarring of the bilateral lung bases. No pleural effusion or pneumothorax. Upper Abdomen: No acute abnormality. Musculoskeletal: No chest wall abnormality. No acute osseous findings. Review of the MIP images confirms the above findings. IMPRESSION: 1. Negative examination for pulmonary embolism. 2. Mild, diffuse bilateral bronchial wall thickening, consistent with nonspecific infectious or inflammatory bronchitis. 3. Status post pull-through esophagectomy. No evidence of malignant recurrence in the chest. 4. Coronary artery disease. Aortic Atherosclerosis (ICD10-I70.0). Electronically Signed   By: Delanna Ahmadi M.D.   On: 09/01/2021 20:34   DG Chest Port 1 View  Result Date: 09/06/2021 CLINICAL DATA:  shortness of breath EXAM: PORTABLE CHEST 1 VIEW COMPARISON:  March 05, 2021 FINDINGS: The cardiomediastinal silhouette is unchanged in contour.Atherosclerotic calcifications of the aorta. Unchanged blunting of the RIGHT costophrenic angle. No large pleural effusion. No pneumothorax. No acute pleuroparenchymal abnormality. Visualized abdomen is unremarkable. IMPRESSION: No acute cardiopulmonary abnormality. Electronically Signed   By: Valentino Saxon M.D.   On: 09/07/2021 16:25    EKG: Independently reviewed.   Assessment/Plan Principal Problem:   Atrial fibrillation with rapid ventricular response (HCC) Active Problems:   Hypertension    Esophageal stricture   Failure to thrive in adult   Assessment and Plan: * Atrial fibrillation with rapid ventricular response ([place]) He has a history of paroxysmal A-fib for which he was on anticoagulation with Xarelto per notes from 2020.  Today heart rates up to 165 on EKG.  Reports sensation of panic attack today, and dizziness a week ago- 2/27.  No chest pain.  Hypokalemia potassium of 3.  Normal magnesium 1.8, TSH 4.4. CHaDsVasc score-at least 2 for hypertension and PAD history. -IV Cardizem 10 mg given with subsequent drop in blood pressure systolic down to 78G.  1 L bolus given. - EDP talked to Cardiology on-call, recommended Cardizem 150 bolus given and subsequently started on drip.  With improvement in heart rate and blood pressure. -Formal cardiology consult in the morning -Obtain echocardiogram -Continue heparin and amiodarone drip -GI consult, recent EGD showing nonbleeding gastric ulcer.  Failure to thrive in adult With falls.  Likely from poor oral intake, generalized weakness.  Head CT without acute abnormality.  Transient dyspnea likely panic attack.  Chest CTA negative for PE, suggest nonspecific infectious or inflammatory bronchitis.  He is afebrile without leukocytosis. -DuoNebs as needed -Steroids held for now  Esophageal stricture With failure to thrive.  Recurrent issue for patient,, has had multiple EGDs and dilatation related to esophageal cancer for which he had surgery.  Recent EGD with esophageal dilatation 08/17/21 by Dr. Jenetta Downer.  Patient reports he has not been able to eat episodes of coughing and choking with oral intake. - Benign-appearing esophageal stenosis. Dilated. Injected. Non-bleeding gastric ulcer with a clean ulcer base ([place] Class III). Normal examined duodenum. -GI consult, also determine risk for anticoagulation considering recent EGD findings -N.p.o. for now - D5 N/s + 20 kcl 75cc/hr x 1 day  Hypertension Blood pressure down to 70s after  Cardizem, 1 L bolus given with improvement in blood pressure, currently on amiodarone drip.Marland Kitchen   - Hold home propranolol.   DVT prophylaxis: Heparin Code Status: Full Family Communication: Daughter and spouse at bedside Disposition Plan: > 2 days Consults called: Cardiology, GI  admission status: Inpatient, stepdown I certify that at the point of admission it is my clinical judgment that the patient will require inpatient hospital care spanning beyond 2  midnights from the point of admission due to high intensity of service, high risk for further deterioration and high frequency of surveillance required.    Bethena Roys MD [facility]  09/13/2021, 11:43 PM

## 2021-08-28 NOTE — Assessment & Plan Note (Addendum)
-  Patient with significant quality decline and multiple falls at home in the setting of failure to thrive, deconditioning and poor balance. ?-Chest CTA negative for PE, suggest nonspecific infectious or inflammatory bronchitis.  He is afebrile and without leukocytosis. ?-CT head without acute intracranial abnormalities. ?-Will request physical therapy evaluation once medically stable. ?-Overall poor prognosis and guarded condition; palliative care has been consulted and will follow results of goals of care discussion.Marland Kitchen ?

## 2021-08-28 NOTE — ED Triage Notes (Addendum)
Pt with hx of esophagus CA . Pt presents here for SOB. Family member states pt has been "talking out of his head"  family states they called EMS and EMS won't take pt here to [facility] and family did not want pt to go to [facility].  BP low at home.  Pt not able to walk.   ?

## 2021-08-28 NOTE — ED Notes (Signed)
Pt confused per family member and states pt is not eating.  ?

## 2021-08-28 NOTE — Assessment & Plan Note (Addendum)
-  He has a history of paroxysmal A-fib for which he was on anticoagulation with Xarelto per notes from 2020.   ?-Normal TSH ?-2D echo demonstrating grade 1 diastolic dysfunction; no wall motion abnormalities and preserved ejection fraction. ?-While attempting Cardizem drip blood pressure drops and patient became hypotensive at time of admission initially.. ?-Following cardiology recommendations amiodarone drip and Lopressor has been used; patient has now cardiovert to sinus rhythm. ?-Short-term and anticoagulation with heparin drip; patient is not a candidate for long-term anticoagulation.   ?-Continue to follow cardiology service recommendations.   ?-CHADsVASC score 2-3 ?

## 2021-08-28 NOTE — Progress Notes (Signed)
ANTICOAGULATION CONSULT NOTE - Initial Consult ? ?Pharmacy Consult for Heparin ?Indication: atrial fibrillation ? ?Allergies  ?Allergen Reactions  ? Drug [Tape]   ?  PLEASE USE COBAN ,,,, ADHESIVE TAPE TEARS SKIN ?  ? Glycopyrrolate Other (See Comments)  ?  Intolerance--unknown reaction  ? Warfarin And Related Other (See Comments)  ?  Excessive bleeding  ? ? ?Patient Measurements: ?Height: '5\' 6"'$  (167.6 cm) ?Weight: 49.9 kg (110 lb) (per family) ?IBW/kg (Calculated) : 63.8 ?HEPARIN DW (KG): 49.9  ? ?Vital Signs: ?Temp: 97.6 ?F (36.4 ?C) (03/05 1513) ?Temp Source: Oral (03/05 1513) ?BP: 119/85 (03/05 1915) ?Pulse Rate: 40 (03/05 1900) ? ?Labs: ?Recent Labs  ?  08/31/2021 ?1615 08/26/2021 ?1652 09/22/2021 ?1804  ?HGB  --  10.5*  --   ?HCT  --  31.0*  --   ?PLT  --  145*  --   ?CREATININE 0.64  --   --   ?TROPONINIHS 7  --  11  ? ? ?Estimated Creatinine Clearance: 69.3 mL/min (by C-G formula based on SCr of 0.64 mg/dL). ? ? ?Medical History: ?Past Medical History:  ?Diagnosis Date  ? Abnormal nuclear stress test   ? Anxiety   ? Cancer [facility])   ? Carotid artery disease ([place])   ? Chronic back pain   ? Chronic lower back pain   ? "L4-5"  ? COPD (chronic obstructive pulmonary disease) ([place])   ? Esophageal cancer ([place])   ? Heavy cigarette smoker (20-39 per day)   ? Unmotivated  ? Hyperlipidemia LDL goal <70   ? Intolerant to statins  ? Hypertension   ? Lumbar disc disease   ? PAD (peripheral artery disease) ([place]) 10/30/2012  ? a) 2011 LEA Dopplers: Left ATA occlusion; b) 10/2013: LEA Dopplers - L Common Iliac & CFA occlusion - w/ short reconstitution at the branch point of the external and internal iliac, the external iliac is occluded through the common femoral and proximal SFA; reconstitutes in the proximal SFA. Two-vessel runoff beyond.; c) s/p LCIA Stent & CFA EA w/ patch angioplasty --> f/u doppler 12/01/2013 patent  ? Pollen allergies   ? Presence of stent in artery   ? ? ?Medications:  ?See med rec ? ?Assessment: ?Patient  presents for altered mental status. Patient with new onset afib. Not on oral anticoagulants. Pharmacy asked to start heparin ? ?Goal of Therapy:  ?Heparin level 0.3-0.7 units/ml ?Monitor platelets by anticoagulation protocol: Yes ?  ?Plan:  ?Give 3000 units bolus x 1 ?Start heparin infusion at 750 units/hr ?Check anti-Xa level in ~6  hours and daily while on heparin ?Continue to monitor H&H and platelets ? ?Isac Sarna, BS Pharm D, BCPS ?Clinical Pharmacist ?Pager [phone] ?09/13/2021,7:51 PM ? ? ?

## 2021-08-28 NOTE — Assessment & Plan Note (Addendum)
-  Soft blood pressure at time of presentation in the setting of arrhythmia and Cardizem drip ?-Now stable. ?-Continue Lopressor as recommended by cardiology service and as needed hydralazine. ?

## 2021-08-29 ENCOUNTER — Inpatient Hospital Stay (HOSPITAL_COMMUNITY): Payer: Medicare Other

## 2021-08-29 ENCOUNTER — Encounter (HOSPITAL_COMMUNITY): Payer: Self-pay | Admitting: Internal Medicine

## 2021-08-29 DIAGNOSIS — I4891 Unspecified atrial fibrillation: Secondary | ICD-10-CM

## 2021-08-29 DIAGNOSIS — R627 Adult failure to thrive: Secondary | ICD-10-CM | POA: Diagnosis not present

## 2021-08-29 DIAGNOSIS — G9341 Metabolic encephalopathy: Secondary | ICD-10-CM

## 2021-08-29 DIAGNOSIS — G9340 Encephalopathy, unspecified: Secondary | ICD-10-CM | POA: Diagnosis not present

## 2021-08-29 DIAGNOSIS — E878 Other disorders of electrolyte and fluid balance, not elsewhere classified: Secondary | ICD-10-CM | POA: Diagnosis not present

## 2021-08-29 DIAGNOSIS — I2510 Atherosclerotic heart disease of native coronary artery without angina pectoris: Secondary | ICD-10-CM

## 2021-08-29 DIAGNOSIS — K222 Esophageal obstruction: Secondary | ICD-10-CM | POA: Diagnosis not present

## 2021-08-29 LAB — BASIC METABOLIC PANEL
Anion gap: 13 (ref 5–15)
BUN: 6 mg/dL (ref 6–20)
CO2: 21 mmol/L — ABNORMAL LOW (ref 22–32)
Calcium: 9.1 mg/dL (ref 8.9–10.3)
Chloride: 93 mmol/L — ABNORMAL LOW (ref 98–111)
Creatinine, Ser: 0.57 mg/dL — ABNORMAL LOW (ref 0.61–1.24)
GFR, Estimated: >60 mL/min (ref >60)
Glucose, Bld: 156 mg/dL — ABNORMAL HIGH (ref 70–99)
Potassium: 3.1 mmol/L — ABNORMAL LOW (ref 3.5–5.1)
Sodium: 127 mmol/L — ABNORMAL LOW (ref 135–145)

## 2021-08-29 LAB — ECHOCARDIOGRAM COMPLETE
AR max vel: 2.38 cm2
AV Area VTI: 2.4 cm2
AV Area mean vel: 2.55 cm2
AV Mean grad: 1 mmHg
AV Peak grad: 3.5 mmHg
Ao pk vel: 0.94 m/s
Area-P 1/2: 3.97 cm2
Height: 66 in
MV VTI: 1.83 cm2
S' Lateral: 2.3 cm
Weight: 1760 oz

## 2021-08-29 LAB — CBC
HCT: 30.9 % — ABNORMAL LOW (ref 39.0–52.0)
HCT: 29.3 % — ABNORMAL LOW (ref 39.0–52.0)
Hemoglobin: 10.8 g/dL — ABNORMAL LOW (ref 13.0–17.0)
Hemoglobin: 10.6 g/dL — ABNORMAL LOW (ref 13.0–17.0)
MCH: 32.9 pg (ref 26.0–34.0)
MCH: 34.3 pg — ABNORMAL HIGH (ref 26.0–34.0)
MCHC: 35 g/dL (ref 30.0–36.0)
MCHC: 36.2 g/dL — ABNORMAL HIGH (ref 30.0–36.0)
MCV: 94.2 fL (ref 80.0–100.0)
MCV: 94.8 fL (ref 80.0–100.0)
Platelets: 137 10*3/uL — ABNORMAL LOW (ref 150–400)
Platelets: 155 10*3/uL (ref 150–400)
RBC: 3.28 MIL/uL — ABNORMAL LOW (ref 4.22–5.81)
RBC: 3.09 MIL/uL — ABNORMAL LOW (ref 4.22–5.81)
RDW: 12.5 % (ref 11.5–15.5)
RDW: 12.6 % (ref 11.5–15.5)
WBC: 4.8 10*3/uL (ref 4.0–10.5)
WBC: 5.2 10*3/uL (ref 4.0–10.5)
nRBC: 0 % (ref 0.0–0.2)
nRBC: 0 % (ref 0.0–0.2)

## 2021-08-29 LAB — MRSA NEXT GEN BY PCR, NASAL: MRSA by PCR Next Gen: NOT DETECTED

## 2021-08-29 LAB — HEPARIN LEVEL (UNFRACTIONATED)
Heparin Unfractionated: <0.1 IU/mL — ABNORMAL LOW (ref 0.30–0.70)
Heparin Unfractionated: 0.28 IU/mL — ABNORMAL LOW (ref 0.30–0.70)
Heparin Unfractionated: 0.36 IU/mL (ref 0.30–0.70)

## 2021-08-29 LAB — PHOSPHORUS: Phosphorus: 1.5 mg/dL — ABNORMAL LOW (ref 2.5–4.6)

## 2021-08-29 MED ORDER — CHLORHEXIDINE GLUCONATE CLOTH 2 % EX PADS
6.0000 | MEDICATED_PAD | Freq: Every day | CUTANEOUS | Status: DC
  Administered 2021-08-29 – 2021-09-05 (×8): 6 via TOPICAL

## 2021-08-29 MED ORDER — CHLORHEXIDINE GLUCONATE 0.12 % MT SOLN
15.0000 mL | Freq: Two times a day (BID) | OROMUCOSAL | Status: DC
  Administered 2021-08-29 – 2021-09-03 (×10): 15 mL via OROMUCOSAL
  Filled 2021-08-29 (×7): qty 15

## 2021-08-29 MED ORDER — POTASSIUM PHOSPHATES 15 MMOLE/5ML IV SOLN
30.0000 mmol | Freq: Once | INTRAVENOUS | Status: AC
  Administered 2021-08-29: 30 mmol via INTRAVENOUS
  Filled 2021-08-29 (×2): qty 10

## 2021-08-29 MED ORDER — ONDANSETRON HCL 4 MG/2ML IJ SOLN
4.0000 mg | Freq: Four times a day (QID) | INTRAMUSCULAR | Status: DC | PRN
  Administered 2021-09-01: 09:00:00 4 mg via INTRAVENOUS
  Filled 2021-08-29: qty 2

## 2021-08-29 MED ORDER — HEPARIN BOLUS VIA INFUSION
1000.0000 [IU] | Freq: Once | INTRAVENOUS | Status: AC
  Administered 2021-08-29: 1000 [IU] via INTRAVENOUS
  Filled 2021-08-29: qty 1000

## 2021-08-29 MED ORDER — LORAZEPAM 2 MG/ML IJ SOLN
1.0000 mg | Freq: Once | INTRAMUSCULAR | Status: AC
  Administered 2021-08-29: 1 mg via INTRAVENOUS
  Filled 2021-08-29: qty 1

## 2021-08-29 MED ORDER — HYDRALAZINE HCL 20 MG/ML IJ SOLN
10.0000 mg | Freq: Three times a day (TID) | INTRAMUSCULAR | Status: DC | PRN
  Administered 2021-08-29: 10 mg via INTRAVENOUS
  Filled 2021-08-29: qty 1

## 2021-08-29 MED ORDER — ONDANSETRON HCL 4 MG PO TABS: 4.0000 mg | ORAL_TABLET | Freq: Four times a day (QID) | ORAL | Status: DC | PRN

## 2021-08-29 MED ORDER — ACETAMINOPHEN 650 MG RE SUPP: 650.0000 mg | Freq: Four times a day (QID) | RECTAL | Status: DC | PRN

## 2021-08-29 MED ORDER — POLYETHYLENE GLYCOL 3350 17 G PO PACK: 17.0000 g | PACK | Freq: Every day | ORAL | Status: DC | PRN

## 2021-08-29 MED ORDER — PANTOPRAZOLE SODIUM 40 MG IV SOLR
40.0000 mg | Freq: Two times a day (BID) | INTRAVENOUS | Status: DC
  Administered 2021-08-29 – 2021-09-05 (×15): 40 mg via INTRAVENOUS
  Filled 2021-08-29 (×15): qty 10

## 2021-08-29 MED ORDER — ORAL CARE MOUTH RINSE
15.0000 mL | Freq: Two times a day (BID) | OROMUCOSAL | Status: DC
  Administered 2021-08-30 – 2021-09-03 (×6): 15 mL via OROMUCOSAL

## 2021-08-29 MED ORDER — HEPARIN BOLUS VIA INFUSION
2000.0000 [IU] | Freq: Once | INTRAVENOUS | Status: AC
  Administered 2021-08-29: 2000 [IU] via INTRAVENOUS

## 2021-08-29 MED ORDER — METOPROLOL TARTRATE 5 MG/5ML IV SOLN
2.5000 mg | Freq: Three times a day (TID) | INTRAVENOUS | Status: DC
  Administered 2021-08-29 – 2021-08-31 (×6): 2.5 mg via INTRAVENOUS
  Filled 2021-08-29 (×6): qty 5

## 2021-08-29 MED ORDER — MORPHINE SULFATE (PF) 2 MG/ML IV SOLN
2.0000 mg | Freq: Once | INTRAVENOUS | Status: AC
  Administered 2021-08-29: 2 mg via INTRAVENOUS
  Filled 2021-08-29: qty 1

## 2021-08-29 MED ORDER — ACETAMINOPHEN 325 MG PO TABS: 650.0000 mg | ORAL_TABLET | Freq: Four times a day (QID) | ORAL | Status: DC | PRN

## 2021-08-29 NOTE — Assessment & Plan Note (Addendum)
-  Patient with hyponatremia, hypokalemia, hypomagnesemia and hypophosphatemia ?-Continue to follow electrolytes trend and further replete them as needed ?-Continue telemetry monitoring. ?-Potassium and phosphorus has been repleted; will provide IV magnesium and follow electrolytes stability. ?-Gentle fluid resuscitation with potassium will be also provided for maintenance. ?

## 2021-08-29 NOTE — Progress Notes (Addendum)
ANTICOAGULATION CONSULT NOTE  ? ?Pharmacy Consult for Heparin ?Indication: atrial fibrillation ? ?Allergies  ?Allergen Reactions  ? Drug [Tape]   ?  PLEASE USE COBAN ,,,, ADHESIVE TAPE TEARS SKIN ?  ? Glycopyrrolate Other (See Comments)  ?  Intolerance--unknown reaction  ? Warfarin And Related Other (See Comments)  ?  Excessive bleeding  ? ? ?Patient Measurements: ?Height: '5\' 6"'$  (167.6 cm) ?Weight: 49.9 kg (110 lb) (per family) ?IBW/kg (Calculated) : 63.8 ?HEPARIN DW (KG): 49.9  ? ?Vital Signs: ?Temp: 97.9 ?F (36.6 ?C) (03/06 1009) ?Temp Source: Oral (03/06 1009) ?BP: 155/96 (03/06 1057) ?Pulse Rate: 88 (03/06 0930) ? ?Labs: ?Recent Labs  ?  09/01/2021 ?1615 09/12/2021 ?1652 09/06/2021 ?1652 08/31/2021 ?1804 08/29/21 ?0213 08/29/21 ?1002  ?HGB  --  10.5*   < >  --  10.8* 10.6*  ?HCT  --  31.0*  --   --  30.9* 29.3*  ?PLT  --  145*  --   --  137* 155  ?HEPARINUNFRC  --   --   --   --  <0.10* 0.28*  ?CREATININE 0.64  --   --   --   --  0.57*  ?TROPONINIHS 7  --   --  11  --   --   ? < > = values in this interval not displayed.  ? ? ? ?Estimated Creatinine Clearance: 69.3 mL/min (A) (by C-G formula based on SCr of 0.57 mg/dL (L)). ? ? ?Medical History: ?Past Medical History:  ?Diagnosis Date  ? Abnormal nuclear stress test   ? Anxiety   ? Cancer [facility])   ? Carotid artery disease ([place])   ? Chronic back pain   ? Chronic lower back pain   ? "L4-5"  ? COPD (chronic obstructive pulmonary disease) ([place])   ? Esophageal cancer ([place])   ? Heavy cigarette smoker (20-39 per day)   ? Unmotivated  ? Hyperlipidemia LDL goal <70   ? Intolerant to statins  ? Hypertension   ? Lumbar disc disease   ? PAD (peripheral artery disease) ([place]) 10/30/2012  ? a) 2011 LEA Dopplers: Left ATA occlusion; b) 10/2013: LEA Dopplers - L Common Iliac & CFA occlusion - w/ short reconstitution at the branch point of the external and internal iliac, the external iliac is occluded through the common femoral and proximal SFA; reconstitutes in the proximal SFA. Two-vessel  runoff beyond.; c) s/p LCIA Stent & CFA EA w/ patch angioplasty --> f/u doppler 12/01/2013 patent  ? Pollen allergies   ? Presence of stent in artery   ? ? ?Assessment: ?Patient presents for altered mental status. Patient with new onset afib. Not on oral anticoagulants. Pharmacy asked to start heparin. Note that recent EGD showed non-bleeding gastric ulcer, GI consult pending.  ? ?Heparin infusing at 750 units/hr ?HL 0.28, just below goal.  No bleeding or IV issues noted. CBC stable on recheck.  ? ?Goal of Therapy:  ?Heparin level 0.3-0.7 units/ml ?Monitor platelets by anticoagulation protocol: Yes ?  ?Plan:  ?Bolus heparin 1000 units, and  Increase heparin rate to 850 units/hr ?Check anti-Xa level in ~6  hours and daily while on heparin ?Continue to monitor H&H and platelets ? ?Isac Sarna, BS Pharm D, BCPS ?Clinical Pharmacist ?Pager [phone] ?08/29/2021 11:44 AM ? ?

## 2021-08-29 NOTE — ED Notes (Signed)
Hospitalist at bedside 

## 2021-08-29 NOTE — Plan of Care (Signed)
°  Problem: Coping: °Goal: Level of anxiety will decrease °Outcome: Progressing °  °

## 2021-08-29 NOTE — Consult Note (Signed)
Gastroenterology Consult   Referring Provider: No ref. provider found Primary Care Physician:  [name], MD Primary Gastroenterologist:  Dr. Jenetta Downer  Patient ID: Raylon Lamson [place]; [id number]; 20-Dec-1960   Admit date: 09/01/2021  LOS: 1 day   Date of Consultation: 08/29/2021  Reason for Consultation:  hx of esophageal cancer and strictures, persistent dysphagia  History of Present Illness   Junior Huezo [place] is a 61 y.o. year old male with history of esophageal adenocarcinoma s/p esophagectomy in 3710 at [facility] complicated by postoperative leak and recurrent strictures s/p multiple dilations; COPD, HTN, HLD, anxiety, PAD, CAD, chronic smoker and alcohol use. Presented to the ED from home due to confusion, weakness, immobility, and poor oral intake since most recent EGD with dilation on 08/17/21. Family reported confusion and that he has been unable to stand or walk over the last few days. They report that he had some dizziness about a week ago and fell twice on that day.    Patient admitted for esophageal stricture in September 2022. - Hgb normal however however folate low and B12 normal at that time.  Last Iron panel checked in 2020, with normal Iron, ferritin, and TIBC, with saturation 65%.  Last EGD 08/17/21 - 1 severe intrinsic stenosis 25 cm from incisors, dilated with ultraslim pediatric, injected with 58m of kenalog, 1 non-bleeding gastric ulcer with clean base (Forrest Classs III), normal duodenum (Recommended open capsule lansoprazole '30mg'$  BID).  Last weight on 08/17/21 - 50.8kg; Current weight today 49.9kg - no significant weight loss since procedure.  ED Course: BNP 105 Bilirubin 3 Na 124, K 3 D-dimer 1.27 Hgb 10.5 TSH 4.699 CT angio - negative for PE, bronchial wall thickening consistent with bronchitis, CAD, evidence of esophagectomy. CT head - generalized cerebral atrophy, no abnormalities otherwise Hr was elevated to 145, EKG performed and found to be in Afib RVR and was  given cardizem with subsequent hypotension, then switched to amiodarone and heparin drip.   Today: Patient reports that he feels well and is hoping to go home in a few hours. Patient denies any abdominal pain. He did report that he has had very little to eat or drink in the last few days. Reports dysphagia however is able to take medications fine. He reports no issues with BM and denied diarrhea, melena, or hematochezia. However all of this is not that reliable considering patient confusion. Most of history provided from chart review and documented history in the computer from H&P that family provided to ED staff.    Past Medical History:  Diagnosis Date   Abnormal nuclear stress test    Anxiety    Cancer (HCC)    Carotid artery disease (HCC)    Chronic back pain    Chronic lower back pain    "L4-5"   COPD (chronic obstructive pulmonary disease) (HCC)    Esophageal cancer (HCC)    Heavy cigarette smoker (20-39 per day)    Unmotivated   Hyperlipidemia LDL goal <70    Intolerant to statins   Hypertension    Lumbar disc disease    PAD [place]ipheral artery disease) [place] 10/[place]   a) 2011 LEA Dopplers: Left ATA occlusion; b) 10/2013: LEA Dopplers - L Common Iliac & CFA occlusion - w/ short reconstitution at the branch point of the external and internal iliac, the external iliac is occluded through the common femoral and proximal SFA; reconstitutes in the proximal SFA. Two-vessel runoff beyond.; c) s/p LCIA Stent & CFA EA w/ patch angioplasty -->  f/u doppler 12/01/2013 patent   Pollen allergies    Presence of stent in artery     Past Surgical History:  Procedure Laterality Date   BACK SURGERY  02/2008   BALLOON DILATION  08/17/2021   Procedure: BALLOON DILATION;  Surgeon: Harvel Quale, MD;  [facility];  Service: Gastroenterology;;   BIOPSY  04/12/2021   Procedure: BIOPSY;  Surgeon: Harvel Quale, MD;  [facility];  Service:  Gastroenterology;;   CARDIAC CATHETERIZATION N/A 02/15/2015   Procedure: Left Heart Cath and Coronary Angiography;  Surgeon: Lorretta Harp, MD;  Location: [place] [facility];  Service: Cardiovascular;  Laterality: N/A;   COLONOSCOPY  07/24/2011   Procedure: COLONOSCOPY;  Surgeon: Daneil Dolin, MD;  [facility];  Service: Endoscopy;  Laterality: N/A;  8:15   CORONARY STENT PLACEMENT     ENDARTERECTOMY FEMORAL Left 11/11/2013   Procedure: Left Femoral Endarterectomy with Patch Angioplasty ;  Surgeon: Angelia Mould, MD;  Location: [place];  Service: Vascular;  Laterality: Left;   ESOPHAGEAL DILATION N/A 08/29/2019   Procedure: ESOPHAGEAL DILATION;  Surgeon: Rogene Houston, MD;  [facility];  Service: Endoscopy;  Laterality: N/A;   ESOPHAGEAL DILATION N/A 03/10/2021   Procedure: ESOPHAGEAL DILATION;  Surgeon: Rogene Houston, MD;  [facility];  Service: Endoscopy;  Laterality: N/A;   ESOPHAGEAL DILATION  06/14/2021   Procedure: ESOPHAGEAL DILATION;  Surgeon: Harvel Quale, MD;  [facility];  Service: Gastroenterology;;   ESOPHAGOGASTRODUODENOSCOPY N/A 10/09/2014   Procedure: ESOPHAGOGASTRODUODENOSCOPY (EGD);  Surgeon: Rogene Houston, MD;  [facility];  Service: Endoscopy;  Laterality: N/A;  230 - moved to 9:55 - Ann notified pt to arrive at 8:55   ESOPHAGOGASTRODUODENOSCOPY (EGD) WITH PROPOFOL N/A 07/21/2019   Procedure: ESOPHAGOGASTRODUODENOSCOPY (EGD) WITH PROPOFOL;  Surgeon: Rogene Houston, MD;  [facility];  Service: Endoscopy;  Laterality: N/A;  11:55   ESOPHAGOGASTRODUODENOSCOPY (EGD) WITH PROPOFOL N/A 08/29/2019   Procedure: ESOPHAGOGASTRODUODENOSCOPY (EGD) WITH PROPOFOL;  Surgeon: Rogene Houston, MD;  [facility];  Service: Endoscopy;  Laterality: N/A;  730   ESOPHAGOGASTRODUODENOSCOPY (EGD) WITH PROPOFOL N/A 03/10/2021   Procedure: ESOPHAGOGASTRODUODENOSCOPY (EGD) WITH  PROPOFOL;  Surgeon: Rogene Houston, MD;  [facility];  Service: Endoscopy;  Laterality: N/A;  1:20 / Following Dr Gala Romney in Room 3 at the end Per Dr Laural Golden - per office ok'd by Dr. Gala Romney   ESOPHAGOGASTRODUODENOSCOPY (EGD) WITH PROPOFOL N/A 04/12/2021   Procedure: ESOPHAGOGASTRODUODENOSCOPY (EGD) WITH PROPOFOL;  Surgeon: Harvel Quale, MD;  [facility];  Service: Gastroenterology;  Laterality: N/A;  8:30   ESOPHAGOGASTRODUODENOSCOPY (EGD) WITH PROPOFOL N/A 06/14/2021   Procedure: ESOPHAGOGASTRODUODENOSCOPY (EGD) WITH PROPOFOL;  Surgeon: Harvel Quale, MD;  [facility];  Service: Gastroenterology;  Laterality: N/A;  7:30   ESOPHAGOGASTRODUODENOSCOPY (EGD) WITH PROPOFOL N/A 08/17/2021   1 severe intrinsic stenosis 25 cm from incisors, dilated with ultraslim pediatric, injected with 61m of kenalog, 1 non-bleeding gastric ulcer with clean base (Forrest Classs III), normal duodenum   ILIAC ARTERY STENT Left 11/03/2013   Dr. BJudithann Sauger  [place]  08/03/2018   KENALOG INJECTION  08/17/2021   Procedure: KENALOG INJECTION;  Surgeon: CHarvel Quale MD;  [facility];  Service: Gastroenterology;;   LOWER EXTREMITY ANGIOGRAM N/A 11/03/2013   Procedure: LOWER EXTREMITY ANGIOGRAM;  Surgeon: JLorretta Harp MD;  Location: [place] [facility];  Service: Cardiovascular;  Laterality: N/A;   LUMBAR MICRODISCECTOMY Left 02/2008   L4-5   NM MYOCAR PERF WALL MOTION  07/2009   persantine - normal perfusion   SURGERY SCROTAL / TESTICULAR Left    cyst excision   THORACIC ESOPHAGUS REPLACEMENT      Prior to Admission medications   Medication Sig Start Date End Date Taking? Authorizing Provider  albuterol (VENTOLIN HFA) 108 (90 Base) MCG/ACT inhaler Inhale 2 puffs into the lungs every 4 (four) hours as needed for wheezing or shortness of breath.     [provider]  ALPRAZolam Duanne Moron) 1 MG  tablet Take 1 mg by mouth at bedtime as needed for anxiety or sleep. 05/22/11   [provider]  amphetamine-dextroamphetamine (ADDERALL) 5 MG tablet Take 5 mg by mouth 2 (two) times daily. Morning & 1400 03/03/21   [provider]  aspirin EC 81 MG tablet Take 1 tablet (81 mg total) by mouth daily. 03/13/21   Rehman, Mechele Dawley, MD  chlorhexidine (PERIDEX) 0.12 % solution Use as directed 15 mLs in the mouth or throat 4 (four) times daily.    [provider]  Cholecalciferol (VITAMIN D3) 50 MCG (2000 UT) CHEW Chew 4,000 Units by mouth daily.    [provider]  diphenoxylate-atropine (LOMOTIL) 2.5-0.025 MG tablet Take 1 tablet by mouth every 6 (six) hours as needed for diarrhea or loose stools.     [provider]  lansoprazole (PREVACID) 30 MG capsule Take 1 capsule (30 mg total) by mouth 2 (two) times daily before a meal. 06/14/21   Montez Morita, Quillian Quince, MD  levothyroxine (SYNTHROID) 50 MCG tablet Take 50 mcg by mouth daily before breakfast.     [provider]  Oxycodone HCl 20 MG TABS Take 1 tablet (20 mg total) by mouth every 4 (four) hours as needed (pain). 11/11/13   Rhyne, Hulen Shouts, PA-C  propranolol (INDERAL) 20 MG tablet Take 20 mg by mouth in the morning. 03/18/21   [provider]  sucralfate (CARAFATE) 1 GM/10ML suspension Take 10 mLs (1 g total) by mouth 4 (four) times daily -  with meals and at bedtime. 04/14/21   Harvel Quale, MD  Tiotropium Bromide-Olodaterol 2.5-2.5 MCG/ACT AERS Inhale 2 puffs into the lungs daily.    [provider]    Current Facility-Administered Medications  Medication Dose Route Frequency Provider Last Rate Last Admin   acetaminophen (TYLENOL) tablet 650 mg  650 mg Oral Q6H PRN Emokpae, Ejiroghene E, MD       Or   acetaminophen (TYLENOL) suppository 650 mg  650 mg Rectal Q6H PRN Emokpae, Ejiroghene E, MD       amiodarone (NEXTERONE PREMIX) 360-4.14 MG/200ML-% (1.8 mg/mL) IV  infusion  30 mg/hr Intravenous Continuous Emokpae, Ejiroghene E, MD 16.67 mL/hr at 08/29/21 0740 30 mg/hr at 08/29/21 0740   dextrose 5 % and 0.9 % NaCl with KCl 20 mEq/L infusion   Intravenous Continuous Emokpae, Ejiroghene E, MD 75 mL/hr at 08/29/21 0126 New Bag at 08/29/21 0126   heparin ADULT infusion 100 units/mL (25000 units/252m)  750 Units/hr Intravenous Continuous Emokpae, Ejiroghene E, MD 7.5 mL/hr at 08/29/21 0753 750 Units/hr at 08/29/21 0753   ipratropium-albuterol (DUONEB) 0.5-2.5 (3) MG/3ML nebulizer solution 3 mL  3 mL Nebulization Q4H PRN Emokpae, Ejiroghene E, MD       ondansetron (ZOFRAN) tablet 4 mg  4 mg Oral Q6H PRN Emokpae, ELeanne Chang MD  Or   ondansetron (ZOFRAN) injection 4 mg  4 mg Intravenous Q6H PRN Emokpae, Ejiroghene E, MD       polyethylene glycol (MIRALAX / GLYCOLAX) packet 17 g  17 g Oral Daily PRN Emokpae, Ejiroghene E, MD       Current Outpatient Medications  Medication Sig Dispense Refill   albuterol (VENTOLIN HFA) 108 (90 Base) MCG/ACT inhaler Inhale 2 puffs into the lungs every 4 (four) hours as needed for wheezing or shortness of breath.      ALPRAZolam (XANAX) 1 MG tablet Take 1 mg by mouth at bedtime as needed for anxiety or sleep.     amphetamine-dextroamphetamine (ADDERALL) 5 MG tablet Take 5 mg by mouth 2 (two) times daily. Morning & 1400     aspirin EC 81 MG tablet Take 1 tablet (81 mg total) by mouth daily. 30 tablet 11   chlorhexidine (PERIDEX) 0.12 % solution Use as directed 15 mLs in the mouth or throat 4 (four) times daily.     Cholecalciferol (VITAMIN D3) 50 MCG (2000 UT) CHEW Chew 4,000 Units by mouth daily.     diphenoxylate-atropine (LOMOTIL) 2.5-0.025 MG tablet Take 1 tablet by mouth every 6 (six) hours as needed for diarrhea or loose stools.      lansoprazole (PREVACID) 30 MG capsule Take 1 capsule (30 mg total) by mouth 2 (two) times daily before a meal. 180 capsule 3   levothyroxine (SYNTHROID) 50 MCG tablet Take 50 mcg by mouth  daily before breakfast.      Oxycodone HCl 20 MG TABS Take 1 tablet (20 mg total) by mouth every 4 (four) hours as needed (pain). 30 tablet 0   propranolol (INDERAL) 20 MG tablet Take 20 mg by mouth in the morning.     sucralfate (CARAFATE) 1 GM/10ML suspension Take 10 mLs (1 g total) by mouth 4 (four) times daily -  with meals and at bedtime. 420 mL 0   Tiotropium Bromide-Olodaterol 2.5-2.5 MCG/ACT AERS Inhale 2 puffs into the lungs daily.      Allergies as of 09/08/2021 - Review Complete 09/13/2021  Allergen Reaction Noted   Drug [tape]  07/21/2019   Glycopyrrolate Other (See Comments) 04/19/2017   Warfarin and related Other (See Comments) 08/02/2018    Family History  Problem Relation Age of Onset   Heart attack Father 21       Cardiac arrest   Sudden death Father    Stroke Brother 40   Stroke Mother    Colon cancer Neg Hx     Social History   Socioeconomic History   Marital status: Married    Spouse name: Not on file   Number of children: Not on file   Years of education: Not on file   Highest education level: Not on file  Occupational History   Occupation: [employer]     Comment: in [place] Use   Smoking status: Every Day    Packs/day: 0.50    Years: 40.00    Pack years: 20.00    Types: Cigarettes    Last attempt to quit: 11/02/2013    Years since quitting: 7.8   Smokeless tobacco: Never  Vaping Use   Vaping Use: Former  Substance and Sexual Activity   Alcohol use: Yes    Alcohol/week: 21.0 standard drinks    Types: 21 Cans of beer per week    Comment: 6-8 beers per week   Drug use: No   Sexual activity: Not Currently  Other Topics Concern  Not on file  Social History Narrative   Married, still smokes about a pack a day. Unable to work currently because of his claudication.   He does work for [employer] in [place], [place].   Social Determinants of Health   Financial Resource Strain: Not on file  Food Insecurity: Not on  file  Transportation Needs: Not on file  Physical Activity: Not on file  Stress: Not on file  Social Connections: Not on file  Intimate Partner Violence: Not on file     Review of Systems   Gen: Denies any fever, chills, loss of appetite, change in weight or weight loss CV: Denies chest pain, heart palpitations, syncope, edema  Resp: Denies shortness of breath with rest, cough, wheezing, coughing up blood, and pleurisy. GI: see HPI GU : Denies urinary burning, blood in urine, urinary frequency, and urinary incontinence. MS: + weakness. Denies joint pain, swelling, cramps, and atrophy.  Derm: Denies rash, itching, dry skin, hives. Psych: + confusion. Denies depression, anxiety, memory loss, hallucinations Heme: Denies bruising or bleeding Neuro:  + dizziness and confusion. Denies any headaches, paresthesias, shaking  Physical Exam   Vital Signs in last 24 hours: Temp:  [97.6 F (36.4 C)] 97.6 F (36.4 C) (03/05 1513) Pulse Rate:  [37-145] 96 (03/06 0800) Resp:  [10-32] 20 (03/06 0800) BP: (74-187)/(64-101) 153/95 (03/06 0800) SpO2:  [96 %-100 %] 100 % (03/06 0800) Weight:  [49.9 kg] 49.9 kg (03/05 1508)    General:   Alert,  thin appearing, pleasant and cooperative in NAD Head:  Normocephalic and atraumatic. Eyes:  Sclera clear, no icterus. Conjunctiva pink. Ears:  Normal auditory acuity. Mouth:  Poor dentition. Neck:  Supple; no masses Lungs:  Clear throughout to auscultation.   No wheezes, crackles, or rhonchi. No acute distress. Mild cough Heart:  irregular rhythm, rate controlled; no murmurs, clicks, rubs,  or gallops. Abdomen:  Soft, flat, nontender and nondistended. No masses, hepatosplenomegaly or hernias noted. Normal bowel sounds, without guarding, and without rebound.   Rectal: deferred Msk:  Symmetrical without gross deformities. Weak Extremities:  Without clubbing or edema. Neurologic:  Alert and oriented x2. Disoriented to month and place.  Skin:  Intact  without significant lesions or rashes. Psych:  Alert and cooperative. Interactive.  Intake/Output from previous day: 03/05 0701 - 03/06 0700 In: 1685.4 [I.V.:226.7; IV Piggyback:1458.7] Out: 1100 [Urine:1100] Intake/Output this shift: No intake/output data recorded.  Weight: 49.9 kg   Labs/Studies   Recent Labs Recent Labs    08/29/2021 1652 08/29/21 0213  WBC 5.1 4.8  HGB 10.5* 10.8*  HCT 31.0* 30.9*  PLT 145* 137*   BMET Recent Labs    09/10/2021 1615  NA 124*  K 3.0*  CL 84*  CO2 23  GLUCOSE 116*  BUN 10  CREATININE 0.64  CALCIUM 9.8   LFT Recent Labs    08/29/2021 1615  PROT 7.9  ALBUMIN 3.5  AST 23  ALT 14  ALKPHOS 57  BILITOT 3.0*   PT/INR No results for input(s): LABPROT, INR in the last 72 hours. Hepatitis Panel No results for input(s): HEPBSAG, HCVAB, HEPAIGM, HEPBIGM in the last 72 hours. C-Diff No results for input(s): CDIFFTOX in the last 72 hours.  Radiology/Studies CT Head Wo Contrast  Result Date: 09/23/2021 CLINICAL DATA:  Altered mental status. EXAM: CT HEAD WITHOUT CONTRAST TECHNIQUE: Contiguous axial images were obtained from the base of the skull through the vertex without intravenous contrast. RADIATION DOSE REDUCTION: This exam was performed according  to the departmental dose-optimization program which includes automated exposure control, adjustment of the mA and/or kV according to patient size and/or use of iterative reconstruction technique. COMPARISON:  February 11, 2007 FINDINGS: Brain: There is mild to moderate severity cerebral atrophy with widening of the extra-axial spaces and ventricular dilatation. There are areas of decreased attenuation within the white matter tracts of the supratentorial brain, consistent with microvascular disease changes. Vascular: No hyperdense vessel or unexpected calcification. Skull: Negative for an acute fracture. A chronic fracture of the left zygomatic arch is seen. Sinuses/Orbits: No acute finding. Other:  None. IMPRESSION: Generalized cerebral atrophy without evidence of an acute intracranial abnormality. Electronically Signed   By: Virgina Norfolk M.D.   On: 09/10/2021 20:29   CT Angio Chest PE W and/or Wo Contrast  Result Date: 09/18/2021 CLINICAL DATA:  PE suspected, shortness of breath, confusion, history of esophageal cancer EXAM: CT ANGIOGRAPHY CHEST WITH CONTRAST TECHNIQUE: Multidetector CT imaging of the chest was performed using the standard protocol during bolus administration of intravenous contrast. Multiplanar CT image reconstructions and MIPs were obtained to evaluate the vascular anatomy. RADIATION DOSE REDUCTION: This exam was performed according to the departmental dose-optimization program which includes automated exposure control, adjustment of the mA and/or kV according to patient size and/or use of iterative reconstruction technique. CONTRAST:  80m OMNIPAQUE IOHEXOL 350 MG/ML SOLN COMPARISON:  10/20/2014 FINDINGS: Cardiovascular: Satisfactory opacification of the pulmonary arteries to the segmental level. No evidence of pulmonary embolism. Normal heart size. Extensive three-vessel coronary artery calcifications and/or stents. No pericardial effusion. Aortic atherosclerosis Mediastinum/Nodes: No enlarged mediastinal, hilar, or axillary lymph nodes. Status post pull-through esophagectomy. Thyroid gland and trachea demonstrate no significant findings. Lungs/Pleura: Mild, diffuse bilateral bronchial wall thickening. Bandlike scarring of the bilateral lung bases. No pleural effusion or pneumothorax. Upper Abdomen: No acute abnormality. Musculoskeletal: No chest wall abnormality. No acute osseous findings. Review of the MIP images confirms the above findings. IMPRESSION: 1. Negative examination for pulmonary embolism. 2. Mild, diffuse bilateral bronchial wall thickening, consistent with nonspecific infectious or inflammatory bronchitis. 3. Status post pull-through esophagectomy. No evidence of  malignant recurrence in the chest. 4. Coronary artery disease. Aortic Atherosclerosis (ICD10-I70.0). Electronically Signed   By: ADelanna AhmadiM.D.   On: 09/04/2021 20:34   DG Chest Port 1 View  Result Date: 09/17/2021 CLINICAL DATA:  shortness of breath EXAM: PORTABLE CHEST 1 VIEW COMPARISON:  March 05, 2021 FINDINGS: The cardiomediastinal silhouette is unchanged in contour.Atherosclerotic calcifications of the aorta. Unchanged blunting of the RIGHT costophrenic angle. No large pleural effusion. No pneumothorax. No acute pleuroparenchymal abnormality. Visualized abdomen is unremarkable. IMPRESSION: No acute cardiopulmonary abnormality. Electronically Signed   By: SValentino SaxonM.D.   On: 08/26/2021 16:25     Assessment   BElsie SakumaSMadagascaris a 61y.o. year old male with [facility] and recurrent strictures s/p multiple dilations; COPD, HTN, HLD, anxiety, PAD, CAD, chronic smoker and alcohol use. Presented to the ED from home due ot confusion, weakness, immobility, and poor oral intake since most recent EGD with dilation on 08/17/21. GI consulted for persistent dysphagia and evaluation for risk of bleeding on anticoagulation for Afib given recent finding of gastric ulcer.   Anemia: Hgb 10.5 on arrival, has remained in 10 range since presentation yesterday. Last hgb from September 2022 15.9 and 13.4. On heparin infusion for Afib, will continue to monitor CBC and for signs of overt GI bleeding while on  heparin.  Dysphagia: Has experienced coughing and choking episodes with oral intake since dilation on 08/17/21. Has reported inability to eat. Difficulty mostly with food, some liquids, but able to take pills. Will keep NPO for possible consideration of EGD with Dr. Jenetta Downer tomorrow, whom the patient is well known by. Concern for malnutrition, talks of [place] vs means for nutrition should be addressed.    Esophageal stricture and gastric ulcer: Esophagectomy in 1751 with complications and recurrent strictures s/p multiple dilations. Recent EGD with dilation by Dr. Jenetta Downer 08/17/21, severe stenosis dilated and steroid injection completed, non-bleeding gastric ulcer found, recommended follow up EGD in 6 weeks and to continue open capsule lansoprazole. Poor PO intake since procedure, given severe stenosis on prior EGD, will consult with Dr. Abbey Chatters on need for EGD vs possible stent placement given ongoing issues. Given patients history with multiple issues with intake and recurrent stricture/stenosis, and now failure to thrive and subsequent confusion (possible hepatic encephalopathy) and falls, palliative care should be considered for [place] discussions.    Plan / Recommendations   Pantoprazole IV 40 mg BID.  Monitor for anemia and overt signs of bleeding.   Continue to monitor H/H. NPO for possible EGD tomorrow.  Possible EGD tomorrow to re-evaluate persistent dysphagia, will be determined by Dr. Jenetta Downer tomorrow. Consider palliative care involvement for [place] discussions given failure to thrive.      08/29/2021, 8:58 AM  Venetia Night, MSN, FNP-BC, AGACNP-BC [facility]

## 2021-08-29 NOTE — Progress Notes (Signed)
Dr. Dyann Kief responded back to secure chat and is aware of patient's blood pressure being elevated 170/105. MD acknowledged and stated that blood pressure is borderline and to continue to monitor at this time. No orders given.  ?

## 2021-08-29 NOTE — Progress Notes (Signed)
Sent Dr. Dyann Kief secure chat and let MD know that patient's blood pressure improved briefly with IV metoprolol and is now elevated again with BP 170/105. Waiting for response from MD.  ?

## 2021-08-29 NOTE — Progress Notes (Signed)
?Progress Note ? ? ?Patient: Jimmy Tucker NWG:[id number] DOB: 31-May-1961 DOA: 09/18/2021     1 ?DOS: the patient was seen and examined on 08/29/2021 ?  ?Brief hospital admission narrative course ?As per H&P written by Dr. Denton Brick on 09/16/2021 ?Jimmy Tucker is a 61 y.o. male with medical history significant for esophageal cancer, hypertension, coronary artery disease, atrial fibrillation, COPD, alcohol abuse. ?Patient was brought to the ED from home with reports of multiple complaints.  Family-spouse and daughter are present at bedside and assist with the history.  They report confusion today.  But over the past several days, since patient had his esophageal dilatation 2/22, he reports poor oral intake, has barely eaten, neurolysed weakness, patient unable to stand over the past few days, cannot walk.  He reports feeling dizzy a week ago, resulting in a fall twice that day.  Reports a feeling of panic attack today with transient difficulty breathing.  But denies specific palpitations.  He denies chest pain. ?  ?ED Course: Tachycardic, heart rate up to 165 on EKG.  Temperature 97.6.  Respiratory rate 17-32.  Blood pressure systolic initially 578/46.  Sodium 124.  Potassium 3.  Magnesium 1.8.  TSH 4.699.  Troponin 7 > 11 EKG shows atrial fibrillation with RVR.  He was given Cardizem with subsequent drop in blood pressure to 74/64.  1 L bolus was given.  Cardizem discontinued.  ED provider talked to cardiology, okay with amiodarone and heparin drip.  And cardiology team to see in the morning. ? ?Assessment and Plan: ?* Atrial fibrillation with rapid ventricular response ([place]) ?-He has a history of paroxysmal A-fib for which he was on anticoagulation with Xarelto per notes from 2020.   ?-Normal TSH ?-2D echo pending ?-While attempting Cardizem drip blood pressure drops and patient became hypotensive. ?-Cardiology has recommended to use amiodarone drip and Lopressor.   ?-Short-term and anticoagulation with heparin drip;  patient is not a candidate for long-term anticoagulation.   ?-Continue to follow cardiology service recommendations.   ?-CHADsVASC score 3 ? ?Fall at home, initial encounter ?- As already mentioned once medically stable will request physical therapy evaluation. ?-Checking B12 and ammonia level. ?-TSH within normal limits. ? ?Electrolyte abnormality ?-Continue to follow electrolytes trend and further replete them as needed ?-Continue telemetry monitoring. ?-Hypokalemia with potassium of 3.1 currently; magnesium 1.8; low phosphorus. ? ?Failure to thrive in adult ?-Patient with significant quality decline and multiple falls at home in the setting of failure to thrive, deconditioning and poor balance. ?-Chest CTA negative for PE, suggest nonspecific infectious or inflammatory bronchitis.  He is afebrile and without leukocytosis. ?-CT head without acute intracranial abnormalities. ?-Will request physical therapy evaluation once medically stable. ?-Overall poor prognosis and guarded condition; palliative care has been consulted. ? ?Esophageal stricture ?-With failure to thrive.  Recurrent issue for patient,, has had multiple EGDs and dilatation related to esophageal cancer for which he had surgery in the past.  ?-Gastroenterology service has been consulted and will follow recommendations.  ?-Continue n.p.o. status for now.  ?-Continue PPI. ? ? ?Hypertension ?-Soft blood pressure at time of presentation in the setting of arrhythmia and Cardizem drip ?-Now climbing ?-Continue Lopressor as recommended by cardiology service and as needed hydralazine. ? ?Stage II pressure injury:  ?-Appreciated in left buttocks present at time of admission ?-Continue constant repositioning and barrier measures. ? ?Subjective:  ?Oriented only to person; reports no chest pain, no nausea, no vomiting.  Good saturation on room air; no shortness of breath.  Chronically ill in appearance and very cachectic.  Patient is weak and  deconditioned. ? ?Physical Exam: ?Vitals:  ? 08/29/21 1558 08/29/21 1600 08/29/21 1700 08/29/21 1722  ?BP:  (!) 170/105 (!) 194/104 (!) 194/104  ?Pulse:  82 87 89  ?Resp:  (!) 23 (!) 25 (!) 21  ?Temp: 97.6 ?F (36.4 ?C)     ?TempSrc: Oral     ?SpO2:  100% 100% 100%  ?Weight:      ?Height:      ? ?General exam: Alert, awake, oriented x 1; poor insight; no chest pain, no nausea, no vomiting. ?Respiratory system: Clear to auscultation. Respiratory effort normal.  No using accessory muscles.  Good saturation on room air. ?Cardiovascular system: rate controlled after amiodarone, no rubs or gallops. ?Gastrointestinal system: Abdomen is nondistended, soft and nontender. No organomegaly or masses felt. Normal bowel sounds heard. ?Central nervous system: Moving 4 limbs spontaneously; no focal deficits. ?Extremities: No cyanosis or clubbing. ?Skin: No petechiae; patient with stage II pressure injuries appreciated on his left buttock.  No signs of superimposed infection.  Mid back also with some abrasions from recent fall.  No active drainage. ?Psychiatry: Judgement and insight appear impaired due to acute encephalopathic process. ? ?Data Reviewed: ?Sodium 127, potassium 3.1; magnesium 1.8. ?CT scan of the head without acute intracranial abnormality; CT angiogram of the chest without findings of PE. ? ?2D echo pending. ? ?-Checking phosphorus level. ? ?Family Communication: Wife at bedside; daughter over the phone. ? ?Disposition: ?Status is: Inpatient ? ?Remains inpatient appropriate because: In need of IV therapy to control A-fib with RVR and also pending evaluation from gastroenterology standpoint due to inability to tolerate diet and oral meds. ? ? Planned Discharge Destination: To be determined currently; physical therapy evaluation to be done once medically stable. ? ? ?Author: ?Barton Dubois, MD ?08/29/2021 6:00 PM ? ?For on call review [email or web address].  ?

## 2021-08-29 NOTE — Progress Notes (Signed)
Sent Dr. Dyann Kief secure chat and let MD know that patient's blood pressure has been elevated with current BP 150/119 and HR 80's NSR. MD placed order for metoprolol. Also made Dr. Dyann Kief aware that patients daughter and wife are at bedside and asking to see doctor.  ?

## 2021-08-29 NOTE — ED Notes (Signed)
GI at bedside

## 2021-08-29 NOTE — ED Notes (Signed)
Hospitalist made aware of pt wounds.  ?

## 2021-08-29 NOTE — TOC Progression Note (Signed)
Transition of Care (TOC) - Progression Note  ? ?Transition of Care (TOC) Screening Note ? ? ?Patient Details  ?Name: Jimmy Tucker ?Date of Birth: 02-15-1961 ? ? ?Transition of Care (TOC) CM/SW Contact:    ?Boneta Lucks, RN ?Phone Number: ?08/29/2021, 1:13 PM ? ?Palliative consulted- patient from home, failure to thrive, not eating, bed bound.  ? ?Transition of Care Department [facility]) has reviewed patient and no TOC needs have been identified at this time. We will continue to monitor patient advancement through interdisciplinary progression rounds. If new patient transition needs arise, please place a TOC consult. ? ? ?  ?Barriers to Discharge: Continued Medical Work up ?  ?

## 2021-08-29 NOTE — ED Notes (Signed)
ED TO INPATIENT HANDOFF REPORT  ED Nurse Name and Phone #: [phone]  S Name/Age/Gender Jimmy Tucker 61 y.o. male Room/Bed: APA14/APA14  Code Status   Code Status: Full Code  Home/SNF/Other Home Patient oriented to: self Is this baseline? Yes   Triage Complete: Triage complete  Chief Complaint Atrial fibrillation with rapid ventricular response ([place]) [I48.91]  Triage Note Pt with hx of esophagus CA . Pt presents here for SOB. Family member states pt has been "talking out of his head"  family states they called EMS and EMS won't take pt here to [facility] and family did not want pt to go to [facility].  BP low at home.  Pt not able to walk.     Allergies Allergies  Allergen Reactions   Drug [Tape]     PLEASE USE COBAN ,,,, ADHESIVE TAPE TEARS SKIN    Glycopyrrolate Other (See Comments)    Intolerance--unknown reaction   Warfarin And Related Other (See Comments)    Excessive bleeding    Level of Care/Admitting Diagnosis ED Disposition     ED Disposition  Admit   Condition  --   [place]: [facility] [[id number]]  Level of Care: Stepdown [14]  Covid Evaluation: Confirmed COVID Negative  Diagnosis: Atrial fibrillation with rapid ventricular response [facility]) [[id number]]  Admitting Physician: Bethena Roys [phone]  Attending Physician: Bethena Roys [phone]  Estimated length of stay: past midnight tomorrow  Certification:: I certify this patient will need inpatient services for at least 2 midnights          B Medical/Surgery History Past Medical History:  Diagnosis Date   Abnormal nuclear stress test    Anxiety    Cancer ([place])    Carotid artery disease ([place])    Chronic back pain    Chronic lower back pain    "L4-5"   COPD (chronic obstructive pulmonary disease) ([place])    Esophageal cancer ([place])    Heavy cigarette smoker (20-39 per day)    Unmotivated   Hyperlipidemia LDL goal <70    Intolerant to statins   Hypertension    Lumbar disc  disease    PAD (peripheral artery disease) ([place]) 10/30/2012   a) 2011 LEA Dopplers: Left ATA occlusion; b) 10/2013: LEA Dopplers - L Common Iliac & CFA occlusion - w/ short reconstitution at the branch point of the external and internal iliac, the external iliac is occluded through the common femoral and proximal SFA; reconstitutes in the proximal SFA. Two-vessel runoff beyond.; c) s/p LCIA Stent & CFA EA w/ patch angioplasty --> f/u doppler 12/01/2013 patent   Pollen allergies    Presence of stent in artery    Past Surgical History:  Procedure Laterality Date   BACK SURGERY  02/2008   BALLOON DILATION  08/17/2021   Procedure: BALLOON DILATION;  Surgeon: Harvel Quale, MD;  [facility];  Service: Gastroenterology;;   BIOPSY  04/12/2021   Procedure: BIOPSY;  Surgeon: Harvel Quale, MD;  [facility];  Service: Gastroenterology;;   CARDIAC CATHETERIZATION N/A 02/15/2015   Procedure: Left Heart Cath and Coronary Angiography;  Surgeon: Lorretta Harp, MD;  Location: [place] CV LAB;  Service: Cardiovascular;  Laterality: N/A;   COLONOSCOPY  07/24/2011   Procedure: COLONOSCOPY;  Surgeon: Daneil Dolin, MD;  Location[facility];  Service: Endoscopy;  Laterality: N/A;  8:15   CORONARY STENT PLACEMENT     ENDARTERECTOMY FEMORAL Left 11/11/2013   Procedure:  Left Femoral Endarterectomy with Patch Angioplasty ;  Surgeon: Angelia Mould, MD;  Location: [place];  Service: Vascular;  Laterality: Left;   ESOPHAGEAL DILATION N/A 08/29/2019   Procedure: ESOPHAGEAL DILATION;  Surgeon: Rogene Houston, MD;  Location[facility];  Service: Endoscopy;  Laterality: N/A;   ESOPHAGEAL DILATION N/A 03/10/2021   Procedure: ESOPHAGEAL DILATION;  Surgeon: Rogene Houston, MD;  [facility];  Service: Endoscopy;  Laterality: N/A;   ESOPHAGEAL DILATION  06/14/2021   Procedure: ESOPHAGEAL DILATION;  Surgeon: Harvel Quale, MD;  [facility];  Service: Gastroenterology;;   ESOPHAGOGASTRODUODENOSCOPY N/A 10/09/2014   Procedure: ESOPHAGOGASTRODUODENOSCOPY (EGD);  Surgeon: Rogene Houston, MD;  [facility];  Service: Endoscopy;  Laterality: N/A;  230 - moved to 9:55 - Ann notified pt to arrive at 8:55   ESOPHAGOGASTRODUODENOSCOPY (EGD) WITH PROPOFOL N/A 07/21/2019   Procedure: ESOPHAGOGASTRODUODENOSCOPY (EGD) WITH PROPOFOL;  Surgeon: Rogene Houston, MD;  [facility];  Service: Endoscopy;  Laterality: N/A;  11:55   ESOPHAGOGASTRODUODENOSCOPY (EGD) WITH PROPOFOL N/A 08/29/2019   Procedure: ESOPHAGOGASTRODUODENOSCOPY (EGD) WITH PROPOFOL;  Surgeon: Rogene Houston, MD;  [facility];  Service: Endoscopy;  Laterality: N/A;  730   ESOPHAGOGASTRODUODENOSCOPY (EGD) WITH PROPOFOL N/A 03/10/2021   Procedure: ESOPHAGOGASTRODUODENOSCOPY (EGD) WITH PROPOFOL;  Surgeon: Rogene Houston, MD;  [facility];  Service: Endoscopy;  Laterality: N/A;  1:20 / Following Dr Gala Romney in Room 3 at the end Per Dr Laural Golden - per office ok'd by Dr. Gala Romney   ESOPHAGOGASTRODUODENOSCOPY (EGD) WITH PROPOFOL N/A 04/12/2021   Procedure: ESOPHAGOGASTRODUODENOSCOPY (EGD) WITH PROPOFOL;  Surgeon: Harvel Quale, MD;  [facility];  Service: Gastroenterology;  Laterality: N/A;  8:30   ESOPHAGOGASTRODUODENOSCOPY (EGD) WITH PROPOFOL N/A 06/14/2021   Procedure: ESOPHAGOGASTRODUODENOSCOPY (EGD) WITH PROPOFOL;  Surgeon: Harvel Quale, MD;  [facility];  Service: Gastroenterology;  Laterality: N/A;  7:30   ESOPHAGOGASTRODUODENOSCOPY (EGD) WITH PROPOFOL N/A 08/17/2021   Procedure: ESOPHAGOGASTRODUODENOSCOPY (EGD) WITH PROPOFOL;  Surgeon: Harvel Quale, MD;  [facility];  Service: Gastroenterology;  Laterality: N/A;  [place] STENT Left 11/03/2013   Dr. Judithann Sauger   IR FLUORO GUIDED NEEDLE [place] ASPIRATION/INJECTION LOC  08/03/2018   KENALOG INJECTION  08/17/2021    Procedure: KENALOG INJECTION;  Surgeon: Harvel Quale, MD;  Location[facility];  Service: Gastroenterology;;   LOWER EXTREMITY ANGIOGRAM N/A 11/03/2013   Procedure: LOWER EXTREMITY ANGIOGRAM;  Surgeon: Lorretta Harp, MD;  Location: [facility] CATH LAB;  Service: Cardiovascular;  Laterality: N/A;   LUMBAR MICRODISCECTOMY Left 02/2008   L4-5   NM MYOCAR PERF WALL MOTION  07/2009   persantine - normal perfusion   SURGERY SCROTAL / TESTICULAR Left    cyst excision   THORACIC ESOPHAGUS REPLACEMENT       A IV Location/Drains/Wounds Patient Lines/Drains/Airways Status     Active Line/Drains/Airways     Name Placement date Placement time Site Days   Peripheral IV 09/20/2021 22 G Left Hand 09/01/2021  1840  Hand  1   Peripheral IV 08/27/2021 20 G Right Hand 09/06/2021  1825  Hand  1   Peripheral IV 09/23/2021 20 G Anterior;Distal;Left;Upper Arm 09/21/2021  1859  Arm  1   Incision (Closed) 11/11/13 Groin Left 11/11/13  0952  -- 2848   Pressure Injury 08/02/18 Stage II -  Partial thickness loss of dermis presenting as a shallow open ulcer with  a red, pink wound bed without slough. 08/02/18  1721  -- 1123   Wound / Incision (Open or Dehisced) 11/11/13 Other (Comment) Toe (Comment  which one) Left arterial 11/11/13  1700  Toe (Comment  which one)  2848   Wound / Incision (Open or Dehisced) 11/11/13 Foot Left;Lateral arterial 11/11/13  1700  Foot  2848            Intake/Output Last 24 hours  Intake/Output Summary (Last 24 hours) at 08/29/2021 0750 Last data filed at 08/29/2021 9622 Gross per 24 hour  Intake 1685.4 ml  Output 1100 ml  Net 585.4 ml    Labs/Imaging Results for orders placed or performed during the hospital encounter of 09/16/2021 (from the past 48 hour(s))  Urinalysis, Routine w reflex microscopic Urine, Clean Catch     Status: Abnormal   Collection Time: 08/27/2021  3:46 PM  Result Value Ref Range   Color, Urine YELLOW YELLOW   APPearance CLEAR CLEAR   Specific Gravity, Urine  1.005 1.005 - 1.030   pH 7.0 5.0 - 8.0   Glucose, UA NEGATIVE NEGATIVE mg/dL   Hgb urine dipstick NEGATIVE NEGATIVE   Bilirubin Urine NEGATIVE NEGATIVE   Ketones, ur 20 (A) NEGATIVE mg/dL   Protein, ur NEGATIVE NEGATIVE mg/dL   Nitrite NEGATIVE NEGATIVE   Leukocytes,Ua NEGATIVE NEGATIVE    Comment: Performed at [facility], [address] [place], [place] [address]  Resp Panel by RT-PCR (Flu A&B, Covid) Nasopharyngeal Swab     Status: None   Collection Time: 09/10/2021  3:47 PM   Specimen: Nasopharyngeal Swab; Nasopharyngeal(NP) swabs in vial transport medium  Result Value Ref Range   SARS Coronavirus 2 by RT PCR NEGATIVE NEGATIVE    Comment: (NOTE) SARS-CoV-2 target nucleic acids are NOT DETECTED.  The SARS-CoV-2 RNA is generally detectable in upper respiratory specimens during the acute phase of infection. The lowest concentration of SARS-CoV-2 viral copies this assay can detect is 138 copies/mL. A negative result does not preclude SARS-Cov-2 infection and should not be used as the sole basis for treatment or other patient management decisions. A negative result may occur with  improper specimen collection/handling, submission of specimen other than nasopharyngeal swab, presence of viral mutation(s) within the areas targeted by this assay, and inadequate number of viral copies(<138 copies/mL). A negative result must be combined with clinical observations, patient history, and epidemiological information. The expected result is Negative.  Fact Sheet for Patients:  EntrepreneurPulse.com.au  Fact Sheet for Healthcare Providers:  IncredibleEmployment.be  This test is no t yet approved or cleared by the [place] FDA and  has been authorized for detection and/or diagnosis of SARS-CoV-2 by FDA under an Emergency Use Authorization (EUA). This EUA will remain  in effect (meaning this test can be used) for the duration of the COVID-19  declaration under Section 564(b)(1) of the Act, 21 U.S.C.section 360bbb-3(b)(1), unless the authorization is terminated  or revoked sooner.       Influenza A by PCR NEGATIVE NEGATIVE   Influenza B by PCR NEGATIVE NEGATIVE    Comment: (NOTE) The Xpert Xpress SARS-CoV-2/FLU/RSV plus assay is intended as an aid in the diagnosis of influenza from Nasopharyngeal swab specimens and should not be used as a sole basis for treatment. Nasal washings and aspirates are unacceptable for Xpert Xpress SARS-CoV-2/FLU/RSV testing.  Fact Sheet for Patients: EntrepreneurPulse.com.au  Fact Sheet for Healthcare Providers: IncredibleEmployment.be  This test is not yet approved or cleared by the [place] FDA and has been  authorized for detection and/or diagnosis of SARS-CoV-2 by FDA under an Emergency Use Authorization (EUA). This EUA will remain in effect (meaning this test can be used) for the duration of the COVID-19 declaration under Section 564(b)(1) of the Act, 21 U.S.C. section 360bbb-3(b)(1), unless the authorization is terminated or revoked.  Performed at [facility], [address], [place], [place] [address]   Comprehensive metabolic panel     Status: Abnormal   Collection Time: 09/15/2021  4:15 PM  Result Value Ref Range   Sodium 124 (L) 135 - 145 mmol/L   Potassium 3.0 (L) 3.5 - 5.1 mmol/L   Chloride 84 (L) 98 - 111 mmol/L   CO2 23 22 - 32 mmol/L   Glucose, Bld 116 (H) 70 - 99 mg/dL    Comment: Glucose reference range applies only to samples taken after fasting for at least 8 hours.   BUN 10 6 - 20 mg/dL   Creatinine, Ser 0.64 0.61 - 1.24 mg/dL   Calcium 9.8 8.9 - 10.3 mg/dL   Total Protein 7.9 6.5 - 8.1 g/dL   Albumin 3.5 3.5 - 5.0 g/dL   AST 23 15 - 41 U/L   ALT 14 0 - 44 U/L   Alkaline Phosphatase 57 38 - 126 U/L   Total Bilirubin 3.0 (H) 0.3 - 1.2 mg/dL   GFR, Estimated >60 >60 mL/min    Comment: (NOTE) Calculated using the CKD-EPI  Creatinine Equation (2021)    Anion gap 17 (H) 5 - 15    Comment: Performed at [facility], [address], [place], [place] [address]  D-dimer, quantitative     Status: Abnormal   Collection Time: 09/20/2021  4:15 PM  Result Value Ref Range   D-Dimer, Quant 1.27 (H) 0.00 - 0.50 ug/mL-FEU    Comment: (NOTE) At the manufacturer cut-off value of 0.5 g/mL FEU, this assay has a negative predictive value of 95-100%.This assay is intended for use in conjunction with a clinical pretest probability (PTP) assessment model to exclude pulmonary embolism (PE) and deep venous thrombosis (DVT) in outpatients suspected of PE or DVT. Results should be correlated with clinical presentation. Performed at [facility], [address], [place], [place] [address]   Troponin I (High Sensitivity)     Status: None   Collection Time: 09/20/2021  4:15 PM  Result Value Ref Range   Troponin I (High Sensitivity) 7 <18 ng/L    Comment: (NOTE) Elevated high sensitivity troponin I (hsTnI) values and significant  changes across serial measurements may suggest ACS but many other  chronic and acute conditions are known to elevate hsTnI results.  Refer to the "Links" section for chest pain algorithms and additional  guidance. Performed at [facility], [address] [place], [place] [address]   Magnesium     Status: None   Collection Time: 09/23/2021  4:15 PM  Result Value Ref Range   Magnesium 1.8 1.7 - 2.4 mg/dL    Comment: Performed at [facility], [address], [place], [place] [address]  Brain natriuretic peptide     Status: Abnormal   Collection Time: 08/29/2021  4:25 PM  Result Value Ref Range   B Natriuretic Peptide 105.0 (H) 0.0 - 100.0 pg/mL    Comment: Performed at [facility], [address] [place], [place] [address]  CBC with Differential/Platelet     Status: Abnormal   Collection Time: 09/04/2021  4:52 PM  Result Value Ref Range   WBC 5.1 4.0 - 10.5 K/uL  RBC 3.25 (L) 4.22 - 5.81 MIL/uL    Hemoglobin 10.5 (L) 13.0 - 17.0 g/dL   HCT 31.0 (L) 39.0 - 52.0 %   MCV 95.4 80.0 - 100.0 fL   MCH 32.3 26.0 - 34.0 pg   MCHC 33.9 30.0 - 36.0 g/dL   RDW 12.4 11.5 - 15.5 %   Platelets 145 (L) 150 - 400 K/uL   nRBC 0.0 0.0 - 0.2 %   Neutrophils Relative % 78 %   Neutro Abs 3.9 1.7 - 7.7 K/uL   Lymphocytes Relative 13 %   Lymphs Abs 0.7 0.7 - 4.0 K/uL   Monocytes Relative 7 %   Monocytes Absolute 0.4 0.1 - 1.0 K/uL   Eosinophils Relative 1 %   Eosinophils Absolute 0.1 0.0 - 0.5 K/uL   Basophils Relative 0 %   Basophils Absolute 0.0 0.0 - 0.1 K/uL   Immature Granulocytes 1 %   Abs Immature Granulocytes 0.04 0.00 - 0.07 K/uL    Comment: Performed at [facility], [address] [place], [place] [address]  TSH     Status: Abnormal   Collection Time: 08/31/2021  6:03 PM  Result Value Ref Range   TSH 4.699 (H) 0.350 - 4.500 uIU/mL    Comment: Performed by a 3rd Generation assay with a functional sensitivity of <=0.01 uIU/mL. Performed at [facility], [address] [place], [place] [address]   Troponin I (High Sensitivity)     Status: None   Collection Time: 08/31/2021  6:04 PM  Result Value Ref Range   Troponin I (High Sensitivity) 11 <18 ng/L    Comment: (NOTE) Elevated high sensitivity troponin I (hsTnI) values and significant  changes across serial measurements may suggest ACS but many other  chronic and acute conditions are known to elevate hsTnI results.  Refer to the "Links" section for chest pain algorithms and additional  guidance. Performed at [facility], [address] [place], [place] [address]   Heparin level (unfractionated)     Status: Abnormal   Collection Time: 08/29/21  2:13 AM  Result Value Ref Range   Heparin Unfractionated <0.10 (L) 0.30 - 0.70 IU/mL    Comment: (NOTE) The clinical reportable range upper limit is being lowered to >1.10 to align with the FDA approved guidance for the current laboratory assay.  If heparin results are below expected values,  and patient dosage has  been confirmed, suggest follow up testing of antithrombin III levels. Performed at [facility], [address] [place], [place] [address]   CBC     Status: Abnormal   Collection Time: 08/29/21  2:13 AM  Result Value Ref Range   WBC 4.8 4.0 - 10.5 K/uL   RBC 3.28 (L) 4.22 - 5.81 MIL/uL   Hemoglobin 10.8 (L) 13.0 - 17.0 g/dL   HCT 30.9 (L) 39.0 - 52.0 %   MCV 94.2 80.0 - 100.0 fL   MCH 32.9 26.0 - 34.0 pg   MCHC 35.0 30.0 - 36.0 g/dL   RDW 12.5 11.5 - 15.5 %   Platelets 137 (L) 150 - 400 K/uL   nRBC 0.0 0.0 - 0.2 %    Comment: Performed at [facility], [address], [place], [place] [address]   CT Head Wo Contrast  Result Date: 09/06/2021 CLINICAL DATA:  Altered mental status. EXAM: CT HEAD WITHOUT CONTRAST TECHNIQUE: Contiguous axial images were obtained from the base of the skull through the vertex without intravenous contrast. RADIATION DOSE REDUCTION: This exam was performed according  to the departmental dose-optimization program which includes automated exposure control, adjustment of the mA and/or kV according to patient size and/or use of iterative reconstruction technique. COMPARISON:  February 11, 2007 FINDINGS: Brain: There is mild to moderate severity cerebral atrophy with widening of the extra-axial spaces and ventricular dilatation. There are areas of decreased attenuation within the white matter tracts of the supratentorial brain, consistent with microvascular disease changes. Vascular: No hyperdense vessel or unexpected calcification. Skull: Negative for an acute fracture. A chronic fracture of the left zygomatic arch is seen. Sinuses/Orbits: No acute finding. Other: None. IMPRESSION: Generalized cerebral atrophy without evidence of an acute intracranial abnormality. Electronically Signed   By: Virgina Norfolk M.D.   On: 09/08/2021 20:29   CT Angio Chest PE W and/or Wo Contrast  Result Date: 08/27/2021 CLINICAL DATA:  PE suspected, shortness of breath,  confusion, history of esophageal cancer EXAM: CT ANGIOGRAPHY CHEST WITH CONTRAST TECHNIQUE: Multidetector CT imaging of the chest was performed using the standard protocol during bolus administration of intravenous contrast. Multiplanar CT image reconstructions and MIPs were obtained to evaluate the vascular anatomy. RADIATION DOSE REDUCTION: This exam was performed according to the departmental dose-optimization program which includes automated exposure control, adjustment of the mA and/or kV according to patient size and/or use of iterative reconstruction technique. CONTRAST:  40m OMNIPAQUE IOHEXOL 350 MG/ML SOLN COMPARISON:  10/20/2014 FINDINGS: Cardiovascular: Satisfactory opacification of the pulmonary arteries to the segmental level. No evidence of pulmonary embolism. Normal heart size. Extensive three-vessel coronary artery calcifications and/or stents. No pericardial effusion. Aortic atherosclerosis Mediastinum/Nodes: No enlarged mediastinal, hilar, or axillary lymph nodes. Status post pull-through esophagectomy. Thyroid gland and trachea demonstrate no significant findings. Lungs/Pleura: Mild, diffuse bilateral bronchial wall thickening. Bandlike scarring of the bilateral lung bases. No pleural effusion or pneumothorax. Upper Abdomen: No acute abnormality. Musculoskeletal: No chest wall abnormality. No acute osseous findings. Review of the MIP images confirms the above findings. IMPRESSION: 1. Negative examination for pulmonary embolism. 2. Mild, diffuse bilateral bronchial wall thickening, consistent with nonspecific infectious or inflammatory bronchitis. 3. Status post pull-through esophagectomy. No evidence of malignant recurrence in the chest. 4. Coronary artery disease. Aortic Atherosclerosis (ICD10-I70.0). Electronically Signed   By: ADelanna AhmadiM.D.   On: 09/06/2021 20:34   DG Chest Port 1 View  Result Date: 09/06/2021 CLINICAL DATA:  shortness of breath EXAM: PORTABLE CHEST 1 VIEW COMPARISON:   March 05, 2021 FINDINGS: The cardiomediastinal silhouette is unchanged in contour.Atherosclerotic calcifications of the aorta. Unchanged blunting of the RIGHT costophrenic angle. No large pleural effusion. No pneumothorax. No acute pleuroparenchymal abnormality. Visualized abdomen is unremarkable. IMPRESSION: No acute cardiopulmonary abnormality. Electronically Signed   By: SValentino SaxonM.D.   On: 09/03/2021 16:25    Pending Labs Unresulted Labs (From admission, onward)     Start     Ordered   09/03/2021 0500  Heparin level (unfractionated)  Daily,   R      09/14/2021 1956   08/29/21 1000  Heparin level (unfractionated)  Once-Timed,   TIMED        08/29/21 0302   08/29/21 0500  CBC  Daily,   R      09/16/2021 1956   08/29/21 0500  CBC  Tomorrow morning,   R        08/29/21 0320   08/29/21 00109 Basic metabolic panel  Tomorrow morning,   R        08/29/21 0320   09/16/2021 1546  CBC WITH DIFFERENTIAL  ONCE -  STAT,   STAT        09/08/2021 1547            Vitals/Pain Today's Vitals   08/29/21 0430 08/29/21 0500 08/29/21 0530 08/29/21 0730  BP: (!) 173/94 (!) 187/82 (!) 157/92 138/88  Pulse: 89 90 88 100  Resp: 20  20 (!) 28  Temp:      TempSrc:      SpO2: 100% 100% 100% 100%  Weight:      Height:      PainSc:        Isolation Precautions No active isolations  Medications Medications  amiodarone (NEXTERONE) 1.8 mg/mL load via infusion 150 mg (150 mg Intravenous Bolus from Bag 08/29/2021 1930)    Followed by  amiodarone (NEXTERONE PREMIX) 360-4.14 MG/200ML-% (1.8 mg/mL) IV infusion (0 mg/hr Intravenous Stopped 08/29/21 0130)    Followed by  amiodarone (NEXTERONE PREMIX) 360-4.14 MG/200ML-% (1.8 mg/mL) IV infusion (30 mg/hr Intravenous Rate/Dose Verify 08/29/21 0740)  heparin bolus via infusion 3,000 Units (3,000 Units Intravenous Bolus from Bag 08/31/2021 2031)    Followed by  heparin ADULT infusion 100 units/mL (25000 units/249m) (750 Units/hr Intravenous Infusion Verify 09/22/2021  2349)  acetaminophen (TYLENOL) tablet 650 mg (has no administration in time range)    Or  acetaminophen (TYLENOL) suppository 650 mg (has no administration in time range)  ondansetron (ZOFRAN) tablet 4 mg (has no administration in time range)    Or  ondansetron (ZOFRAN) injection 4 mg (has no administration in time range)  polyethylene glycol (MIRALAX / GLYCOLAX) packet 17 g (has no administration in time range)  ipratropium-albuterol (DUONEB) 0.5-2.5 (3) MG/3ML nebulizer solution 3 mL (has no administration in time range)  dextrose 5 % and 0.9 % NaCl with KCl 20 mEq/L infusion ( Intravenous New Bag/Given 08/29/21 0126)  lactated ringers bolus 1,000 mL (0 mLs Intravenous Stopped 09/12/2021 1852)  diltiazem (CARDIZEM) 1 mg/mL load via infusion 10 mg (10 mg Intravenous Bolus from Bag 09/07/2021 1832)  fentaNYL (SUBLIMAZE) injection 50 mcg (50 mcg Intravenous Given 09/18/2021 1825)  potassium chloride 10 mEq in 100 mL IVPB (0 mEq Intravenous Stopped 09/20/2021 2345)  iohexol (OMNIPAQUE) 350 MG/ML injection 75 mL (75 mLs Intravenous Contrast Given 09/01/2021 2004)  ipratropium-albuterol (DUONEB) 0.5-2.5 (3) MG/3ML nebulizer solution 3 mL (3 mLs Nebulization Given 09/19/2021 2357)  heparin bolus via infusion 2,000 Units (2,000 Units Intravenous Bolus from Bag 08/29/21 0314)    Mobility walks with device High fall risk   Focused Assessments    R Recommendations: See Admitting Provider Note  Report given to:   Additional Notes:

## 2021-08-29 NOTE — Consult Note (Signed)
CARDIOLOGY CONSULT NOTE       Patient ID: Jimmy Tucker MRN: [id number] DOB/AGE: 61/61/1962 61 y.o.  Admit date: 09/11/2021 Referring Physician: Denton Brick Primary Physician: [name], MD Primary Cardiologist: [name] Reason for Consultation: afib  Principal Problem:   Atrial fibrillation with rapid ventricular response [facility]) Active Problems:   Hypertension   Esophageal stricture   Failure to thrive in adult   Electrolyte abnormality   Fall at home, initial encounter   HPI:  61 y.o. with history of afib, moderate CAD, PVD post left left femoral endarterectomy esophageal cancer  ETOH abuse post esophageal dilatations 08/17/21 admitted with encephalopathy malaise and falls Found to be in rapid afib now controlled with amiodarone and cardizem d/c due to drop in BP. Not on anticoagulation prior to admission EF has been normal. This am no cardiac complaints still with confusion asking to eat. CT head negative Hct 31 PLT 145 normal renal function BNP only 105 CXR NAD   ROS All other systems reviewed and negative except as noted above  Past Medical History:  Diagnosis Date   Abnormal nuclear stress test    Anxiety    Cancer (HCC)    Carotid artery disease (HCC)    Chronic back pain    Chronic lower back pain    "L4-5"   COPD (chronic obstructive pulmonary disease) (HCC)    Esophageal cancer (HCC)    Heavy cigarette smoker (20-39 per day)    Unmotivated   Hyperlipidemia LDL goal <70    Intolerant to statins   Hypertension    Lumbar disc disease    PAD (peripheral artery disease) ([place]) 10/30/2012   a) 2011 LEA Dopplers: Left ATA occlusion; b) 10/2013: LEA Dopplers - L Common Iliac & CFA occlusion - w/ short reconstitution at the branch point of the external and internal iliac, the external iliac is occluded through the common femoral and proximal SFA; reconstitutes in the proximal SFA. Two-vessel runoff beyond.; c) s/p LCIA Stent & CFA EA w/ patch angioplasty --> f/u doppler  12/01/2013 patent   Pollen allergies    Presence of stent in artery     Family History  Problem Relation Age of Onset   Heart attack Father 52       Cardiac arrest   Sudden death Father    Stroke Brother 16   Stroke Mother    Colon cancer Neg Hx     Social History   Socioeconomic History   Marital status: Married    Spouse name: Not on file   Number of children: Not on file   Years of education: Not on file   Highest education level: Not on file  Occupational History   Occupation: [employer]     Comment: in [place] Use   Smoking status: Every Day    Packs/day: 0.50    Years: 40.00    Pack years: 20.00    Types: Cigarettes    Last attempt to quit: 11/02/2013    Years since quitting: 7.8   Smokeless tobacco: Never  Vaping Use   Vaping Use: Former  Substance and Sexual Activity   Alcohol use: Yes    Alcohol/week: 21.0 standard drinks    Types: 21 Cans of beer per week    Comment: 6-8 beers per week   Drug use: No   Sexual activity: Not Currently  Other Topics Concern   Not on file  Social History Narrative   Married, still smokes about a pack a day. Unable  to work currently because of his claudication.   He does work for [employer] in [place], [place].   Social Determinants of Health   Financial Resource Strain: Not on file  Food Insecurity: Not on file  Transportation Needs: Not on file  Physical Activity: Not on file  Stress: Not on file  Social Connections: Not on file  Intimate Partner Violence: Not on file    Past Surgical History:  Procedure Laterality Date   BACK SURGERY  02/2008   BALLOON DILATION  08/17/2021   Procedure: BALLOON DILATION;  Surgeon: Harvel Quale, MD;  [facility];  Service: Gastroenterology;;   BIOPSY  04/12/2021   Procedure: BIOPSY;  Surgeon: Harvel Quale, MD;  [facility];  Service: Gastroenterology;;   CARDIAC CATHETERIZATION N/A 02/15/2015   Procedure:  Left Heart Cath and Coronary Angiography;  Surgeon: Lorretta Harp, MD;  Location: [facility];  Service: Cardiovascular;  Laterality: N/A;   COLONOSCOPY  07/24/2011   Procedure: COLONOSCOPY;  Surgeon: Daneil Dolin, MD;  [facility];  Service: Endoscopy;  Laterality: N/A;  8:15   CORONARY STENT PLACEMENT     ENDARTERECTOMY FEMORAL Left 11/11/2013   Procedure: Left Femoral Endarterectomy with Patch Angioplasty ;  Surgeon: Angelia Mould, MD;  Location: [facility];  Service: Vascular;  Laterality: Left;   ESOPHAGEAL DILATION N/A 08/29/2019   Procedure: ESOPHAGEAL DILATION;  Surgeon: Rogene Houston, MD;  [facility];  Service: Endoscopy;  Laterality: N/A;   ESOPHAGEAL DILATION N/A 03/10/2021   Procedure: ESOPHAGEAL DILATION;  Surgeon: Rogene Houston, MD;  [facility];  Service: Endoscopy;  Laterality: N/A;   ESOPHAGEAL DILATION  06/14/2021   Procedure: ESOPHAGEAL DILATION;  Surgeon: Harvel Quale, MD;  [facility];  Service: Gastroenterology;;   ESOPHAGOGASTRODUODENOSCOPY N/A 10/09/2014   Procedure: ESOPHAGOGASTRODUODENOSCOPY (EGD);  Surgeon: Rogene Houston, MD;  [facility];  Service: Endoscopy;  Laterality: N/A;  230 - moved to 9:55 - Ann notified pt to arrive at 8:55   ESOPHAGOGASTRODUODENOSCOPY (EGD) WITH PROPOFOL N/A 07/21/2019   Procedure: ESOPHAGOGASTRODUODENOSCOPY (EGD) WITH PROPOFOL;  Surgeon: Rogene Houston, MD;  [facility];  Service: Endoscopy;  Laterality: N/A;  11:55   ESOPHAGOGASTRODUODENOSCOPY (EGD) WITH PROPOFOL N/A 08/29/2019   Procedure: ESOPHAGOGASTRODUODENOSCOPY (EGD) WITH PROPOFOL;  Surgeon: Rogene Houston, MD;  [facility];  Service: Endoscopy;  Laterality: N/A;  730   ESOPHAGOGASTRODUODENOSCOPY (EGD) WITH PROPOFOL N/A 03/10/2021   Procedure: ESOPHAGOGASTRODUODENOSCOPY (EGD) WITH PROPOFOL;  Surgeon: Rogene Houston, MD;  [facility];  Service: Endoscopy;   Laterality: N/A;  1:20 / Following Dr Gala Romney in Room 3 at the end Per Dr Laural Golden - per office ok'd by Dr. Gala Romney   ESOPHAGOGASTRODUODENOSCOPY (EGD) WITH PROPOFOL N/A 04/12/2021   Procedure: ESOPHAGOGASTRODUODENOSCOPY (EGD) WITH PROPOFOL;  Surgeon: Harvel Quale, MD;  [facility];  Service: Gastroenterology;  Laterality: N/A;  8:30   ESOPHAGOGASTRODUODENOSCOPY (EGD) WITH PROPOFOL N/A 06/14/2021   Procedure: ESOPHAGOGASTRODUODENOSCOPY (EGD) WITH PROPOFOL;  Surgeon: Harvel Quale, MD;  [facility];  Service: Gastroenterology;  Laterality: N/A;  7:30   ESOPHAGOGASTRODUODENOSCOPY (EGD) WITH PROPOFOL N/A 08/17/2021   Procedure: ESOPHAGOGASTRODUODENOSCOPY (EGD) WITH PROPOFOL;  Surgeon: Harvel Quale, MD;  [facility];  Service: Gastroenterology;  Laterality: N/A;  [place] Left 11/03/2013   Dr. Judithann Sauger   IR [facility]  ASPIRATION/INJECTION LOC  08/03/2018   KENALOG INJECTION  08/17/2021   Procedure: KENALOG INJECTION;  Surgeon: Harvel Quale, MD;  [facility];  Service: Gastroenterology;;   LOWER EXTREMITY ANGIOGRAM N/A 11/03/2013   Procedure: LOWER EXTREMITY ANGIOGRAM;  Surgeon: Lorretta Harp, MD;  Location: [facility] C[facility];  Service: Cardiovascular;  Laterality: N/A;   LUMBAR MICRODISCECTOMY Left 02/2008   L4-5   NM MYOCAR PERF WALL MOTION  07/2009   persantine - normal perfusion   SURGERY SCROTAL / TESTICULAR Left    cyst excision   THORACIC ESOPHAGUS REPLACEMENT        Current Facility-Administered Medications:    acetaminophen (TYLENOL) tablet 650 mg, 650 mg, Oral, Q6H PRN **OR** acetaminophen (TYLENOL) suppository 650 mg, 650 mg, Rectal, Q6H PRN, Emokpae, Ejiroghene E, MD   [COMPLETED] amiodarone (NEXTERONE) 1.8 mg/mL load via infusion 150 mg, 150 mg, Intravenous, Once, 150 mg at 09/09/2021 1930 **FOLLOWED BY** [EXPIRED] amiodarone (NEXTERONE PREMIX) 360-4.14 MG/200ML-% (1.8 mg/mL) IV  infusion, 60 mg/hr, Intravenous, Continuous, Stopped at 08/29/21 0130 **FOLLOWED BY** amiodarone (NEXTERONE PREMIX) 360-4.14 MG/200ML-% (1.8 mg/mL) IV infusion, 30 mg/hr, Intravenous, Continuous, Emokpae, Ejiroghene E, MD, Last Rate: 16.67 mL/hr at 08/29/21 0740, 30 mg/hr at 08/29/21 0740   dextrose 5 % and 0.9 % NaCl with KCl 20 mEq/L infusion, , Intravenous, Continuous, Emokpae, Ejiroghene E, MD, Last Rate: 75 mL/hr at 08/29/21 0126, New Bag at 08/29/21 0126   [COMPLETED] heparin bolus via infusion 3,000 Units, 3,000 Units, Intravenous, Once, 3,000 Units at 09/23/2021 2031 **FOLLOWED BY** heparin ADULT infusion 100 units/mL (25000 units/254m), 750 Units/hr, Intravenous, Continuous, Emokpae, Ejiroghene E, MD, Last Rate: 7.5 mL/hr at 08/29/21 0753, 750 Units/hr at 08/29/21 0753   ipratropium-albuterol (DUONEB) 0.5-2.5 (3) MG/3ML nebulizer solution 3 mL, 3 mL, Nebulization, Q4H PRN, Emokpae, Ejiroghene E, MD   ondansetron (ZOFRAN) tablet 4 mg, 4 mg, Oral, Q6H PRN **OR** ondansetron (ZOFRAN) injection 4 mg, 4 mg, Intravenous, Q6H PRN, Emokpae, Ejiroghene E, MD   polyethylene glycol (MIRALAX / GLYCOLAX) packet 17 g, 17 g, Oral, Daily PRN, Emokpae, Ejiroghene E, MD  Current Outpatient Medications:    albuterol (VENTOLIN HFA) 108 (90 Base) MCG/ACT inhaler, Inhale 2 puffs into the lungs every 4 (four) hours as needed for wheezing or shortness of breath. , Disp: , Rfl:    ALPRAZolam (XANAX) 1 MG tablet, Take 1 mg by mouth at bedtime as needed for anxiety or sleep., Disp: , Rfl:    amphetamine-dextroamphetamine (ADDERALL) 5 MG tablet, Take 5 mg by mouth 2 (two) times daily. Morning & 1400, Disp: , Rfl:    aspirin EC 81 MG tablet, Take 1 tablet (81 mg total) by mouth daily., Disp: 30 tablet, Rfl: 11   chlorhexidine (PERIDEX) 0.12 % solution, Use as directed 15 mLs in the mouth or throat 4 (four) times daily., Disp: , Rfl:    Cholecalciferol (VITAMIN D3) 50 MCG (2000 UT) CHEW, Chew 4,000 Units by mouth daily.,  Disp: , Rfl:    diphenoxylate-atropine (LOMOTIL) 2.5-0.025 MG tablet, Take 1 tablet by mouth every 6 (six) hours as needed for diarrhea or loose stools. , Disp: , Rfl:    lansoprazole (PREVACID) 30 MG capsule, Take 1 capsule (30 mg total) by mouth 2 (two) times daily before a meal., Disp: 180 capsule, Rfl: 3   levothyroxine (SYNTHROID) 50 MCG tablet, Take 50 mcg by mouth daily before breakfast. , Disp: , Rfl:    Oxycodone HCl 20 MG TABS, Take 1 tablet (20 mg total) by mouth every  4 (four) hours as needed (pain)., Disp: 30 tablet, Rfl: 0   propranolol (INDERAL) 20 MG tablet, Take 20 mg by mouth in the morning., Disp: , Rfl:    sucralfate (CARAFATE) 1 GM/10ML suspension, Take 10 mLs (1 g total) by mouth 4 (four) times daily -  with meals and at bedtime., Disp: 420 mL, Rfl: 0   Tiotropium Bromide-Olodaterol 2.5-2.5 MCG/ACT AERS, Inhale 2 puffs into the lungs daily., Disp: , Rfl:    amiodarone 30 mg/hr (08/29/21 0740)   dextrose 5 % and 0.9 % NaCl with KCl 20 mEq/L 75 mL/hr at 08/29/21 0126   heparin 750 Units/hr (08/29/21 0753)    Physical Exam: Blood pressure (!) 153/95, pulse 96, temperature 97.6 F (36.4 C), temperature source Oral, resp. rate 20, height '5\' 6"'$  (1.676 m), weight 49.9 kg, SpO2 100 %.    Confused Poor dentition Lungs clear No murmur Abdomen benign No edema Decreased pedal pulses   Labs:   Lab Results  Component Value Date   WBC 4.8 08/29/2021   HGB 10.8 (L) 08/29/2021   HCT 30.9 (L) 08/29/2021   MCV 94.2 08/29/2021   PLT 137 (L) 08/29/2021    Recent Labs  Lab 09/01/2021 1615  NA 124*  K 3.0*  CL 84*  CO2 23  BUN 10  CREATININE 0.64  CALCIUM 9.8  PROT 7.9  BILITOT 3.0*  ALKPHOS 57  ALT 14  AST 23  GLUCOSE 116*   No results found for: CKTOTAL, CKMB, CKMBINDEX, TROPONINI  Lab Results  Component Value Date   CHOL 178 05/18/2014   Lab Results  Component Value Date   HDL 54 05/18/2014   Lab Results  Component Value Date   LDLCALC 62 05/18/2014    Lab Results  Component Value Date   TRIG 309 (H) 05/18/2014   Lab Results  Component Value Date   CHOLHDL 3.3 05/18/2014   No results found for: LDLDIRECT    Radiology: CT Head Wo Contrast  Result Date: 09/13/2021 CLINICAL DATA:  Altered mental status. EXAM: CT HEAD WITHOUT CONTRAST TECHNIQUE: Contiguous axial images were obtained from the base of the skull through the vertex without intravenous contrast. RADIATION DOSE REDUCTION: This exam was performed according to the departmental dose-optimization program which includes automated exposure control, adjustment of the mA and/or kV according to patient size and/or use of iterative reconstruction technique. COMPARISON:  February 11, 2007 FINDINGS: Brain: There is mild to moderate severity cerebral atrophy with widening of the extra-axial spaces and ventricular dilatation. There are areas of decreased attenuation within the white matter tracts of the supratentorial brain, consistent with microvascular disease changes. Vascular: No hyperdense vessel or unexpected calcification. Skull: Negative for an acute fracture. A chronic fracture of the left zygomatic arch is seen. Sinuses/Orbits: No acute finding. Other: None. IMPRESSION: Generalized cerebral atrophy without evidence of an acute intracranial abnormality. Electronically Signed   By: Virgina Norfolk M.D.   On: 09/01/2021 20:29   CT Angio Chest PE W and/or Wo Contrast  Result Date: 09/09/2021 CLINICAL DATA:  PE suspected, shortness of breath, confusion, history of esophageal cancer EXAM: CT ANGIOGRAPHY CHEST WITH CONTRAST TECHNIQUE: Multidetector CT imaging of the chest was performed using the standard protocol during bolus administration of intravenous contrast. Multiplanar CT image reconstructions and MIPs were obtained to evaluate the vascular anatomy. RADIATION DOSE REDUCTION: This exam was performed according to the departmental dose-optimization program which includes automated exposure  control, adjustment of the mA and/or kV according to patient size and/or use  of iterative reconstruction technique. CONTRAST:  24m OMNIPAQUE IOHEXOL 350 MG/ML SOLN COMPARISON:  10/20/2014 FINDINGS: Cardiovascular: Satisfactory opacification of the pulmonary arteries to the segmental level. No evidence of pulmonary embolism. Normal heart size. Extensive three-vessel coronary artery calcifications and/or stents. No pericardial effusion. Aortic atherosclerosis Mediastinum/Nodes: No enlarged mediastinal, hilar, or axillary lymph nodes. Status post pull-through esophagectomy. Thyroid gland and trachea demonstrate no significant findings. Lungs/Pleura: Mild, diffuse bilateral bronchial wall thickening. Bandlike scarring of the bilateral lung bases. No pleural effusion or pneumothorax. Upper Abdomen: No acute abnormality. Musculoskeletal: No chest wall abnormality. No acute osseous findings. Review of the MIP images confirms the above findings. IMPRESSION: 1. Negative examination for pulmonary embolism. 2. Mild, diffuse bilateral bronchial wall thickening, consistent with nonspecific infectious or inflammatory bronchitis. 3. Status post pull-through esophagectomy. No evidence of malignant recurrence in the chest. 4. Coronary artery disease. Aortic Atherosclerosis (ICD10-I70.0). Electronically Signed   By: ADelanna AhmadiM.D.   On: 09/06/2021 20:34   DG Chest Port 1 View  Result Date: 08/29/2021 CLINICAL DATA:  shortness of breath EXAM: PORTABLE CHEST 1 VIEW COMPARISON:  March 05, 2021 FINDINGS: The cardiomediastinal silhouette is unchanged in contour.Atherosclerotic calcifications of the aorta. Unchanged blunting of the RIGHT costophrenic angle. No large pleural effusion. No pneumothorax. No acute pleuroparenchymal abnormality. Visualized abdomen is unremarkable. IMPRESSION: No acute cardiopulmonary abnormality. Electronically Signed   By: SValentino SaxonM.D.   On: 09/01/2021 16:25    EKG: afib rate 165  inferior lateral ST depression   ASSESSMENT AND PLAN:  Afib.  Poor long term anticoagulation candidate with falls anemia , esophageal pathology and ETOH abuse with encephalopathy continue amiodarone and short term heparin for now TTE pending  Check ECG rates 90's may have converted  CAD: moderate RCA/LAD disease by cath 02/15/15 no chest pain R/O ECG with inferior lateral ST depression when in rapid afib See above start low dose lopressor  Encephalopathy  CT negative per primary service ETOH abuse check ammonia withdrawal precautions Esophageal Cancer.  Recent dilatation NPO swallowing study to r/o aspiration  PVD:  decreased pedal pulses post left femoral endarterectomy previosu ulcers healed  can update ABI;s as outpatient f/u Dr BGwenlyn Found  Signed: PJenkins Rouge3/11/2021, 8:37 AM

## 2021-08-29 NOTE — Assessment & Plan Note (Addendum)
-  As already mentioned once medically stable will request physical therapy evaluation. ?-B12, ammonia level and TSH within normal limits. ?-Family reporting difficulty sleeping and chronic use of pain medications and benzodiazepines as an outpatient. ?-Continue to be intermittently disoriented. ?

## 2021-08-29 NOTE — Progress Notes (Signed)
Attempted for 10 minutes to convince patient to allow lab to draw his heparin level and patient continues to refuse. Sent Dr. Dyann Kief secure chat to let him know that patient refusing heparin lab draw.  ?

## 2021-08-29 NOTE — Progress Notes (Signed)
Sent Dr. Dyann Kief secure chat and made MD aware that patients blood pressure is 194/104. MD placed orders for PRN hydralazine.  ?

## 2021-08-29 NOTE — Progress Notes (Signed)
*  PRELIMINARY RESULTS* ?Echocardiogram ?2D Echocardiogram has been performed. ? ?Jimmy Tucker ?08/29/2021, 11:37 AM ?

## 2021-08-29 NOTE — ED Notes (Signed)
Pt linens and gown changed. Sacral dressing placed on pts bottom. Pt's bottom noted to be red. Pt with 2 small wounds to mid and upper spine, states from fall. ABD pads applied to each wound.  ?

## 2021-08-29 NOTE — Progress Notes (Signed)
ANTICOAGULATION CONSULT NOTE  ? ?Pharmacy Consult for Heparin ?Indication: atrial fibrillation ? ?Allergies  ?Allergen Reactions  ? Drug [Tape]   ?  PLEASE USE COBAN ,,,, ADHESIVE TAPE TEARS SKIN ?  ? Glycopyrrolate Other (See Comments)  ?  Intolerance--unknown reaction  ? Warfarin And Related Other (See Comments)  ?  Excessive bleeding  ? ? ?Patient Measurements: ?Height: '5\' 6"'$  (167.6 cm) ?Weight: 49.9 kg (110 lb) (per family) ?IBW/kg (Calculated) : 63.8 ?HEPARIN DW (KG): 49.9  ? ?Vital Signs: ?Temp: 97.6 ?F (36.4 ?C) (03/05 1513) ?Temp Source: Oral (03/05 1513) ?BP: 153/87 (03/06 0230) ?Pulse Rate: 93 (03/06 0230) ? ?Labs: ?Recent Labs  ?  08/27/2021 ?1615 09/10/2021 ?1652 09/22/2021 ?1804 08/29/21 ?0213  ?HGB  --  10.5*  --  10.8*  ?HCT  --  31.0*  --  30.9*  ?PLT  --  145*  --  137*  ?HEPARINUNFRC  --   --   --  <0.10*  ?CREATININE 0.64  --   --   --   ?TROPONINIHS 7  --  11  --   ? ? ? ?Estimated Creatinine Clearance: 69.3 mL/min (by C-G formula based on SCr of 0.64 mg/dL). ? ? ?Medical History: ?Past Medical History:  ?Diagnosis Date  ? Abnormal nuclear stress test   ? Anxiety   ? Cancer [facility])   ? Carotid artery disease ([place])   ? Chronic back pain   ? Chronic lower back pain   ? "L4-5"  ? COPD (chronic obstructive pulmonary disease) ([place])   ? Esophageal cancer ([place])   ? Heavy cigarette smoker (20-39 per day)   ? Unmotivated  ? Hyperlipidemia LDL goal <70   ? Intolerant to statins  ? Hypertension   ? Lumbar disc disease   ? PAD (peripheral artery disease) ([place]) 10/30/2012  ? a) 2011 LEA Dopplers: Left ATA occlusion; b) 10/2013: LEA Dopplers - L Common Iliac & CFA occlusion - w/ short reconstitution at the branch point of the external and internal iliac, the external iliac is occluded through the common femoral and proximal SFA; reconstitutes in the proximal SFA. Two-vessel runoff beyond.; c) s/p LCIA Stent & CFA EA w/ patch angioplasty --> f/u doppler 12/01/2013 patent  ? Pollen allergies   ? Presence of stent in artery    ? ? ?Assessment: ?Patient presents for altered mental status. Patient with new onset afib. Not on oral anticoagulants. Pharmacy asked to start heparin. Note that recent EGD showed non-bleeding gastric ulcer, GI consult pending.  ? ?Initial level undetectable on 750 units/hr of heparin. No bleeding or IV issues noted. CBC stable on recheck.  ? ?Goal of Therapy:  ?Heparin level 0.3-0.7 units/ml ?Monitor platelets by anticoagulation protocol: Yes ?  ?Plan:  ?Rebolus heparin 2000 units then increase rate to 950 units/hr ?Recheck heparin level in 6 hours ? ?Erin Hearing PharmD., BCPS ?Clinical Pharmacist ?08/29/2021 3:00 AM ? ?

## 2021-08-29 NOTE — Progress Notes (Signed)
ANTICOAGULATION CONSULT NOTE  ? ?Pharmacy Consult for Heparin ?Indication: atrial fibrillation ? ?Allergies  ?Allergen Reactions  ? Drug [Tape]   ?  PLEASE USE COBAN ,,,, ADHESIVE TAPE TEARS SKIN ?  ? Glycopyrrolate Other (See Comments)  ?  Intolerance--unknown reaction  ? Warfarin And Related Other (See Comments)  ?  Excessive bleeding  ? ? ?Patient Measurements: ?Height: '5\' 6"'$  (167.6 cm) ?Weight: 46.6 kg (102 lb 11.8 oz) ?IBW/kg (Calculated) : 63.8 ?HEPARIN DW (KG): 46.6  ? ?Vital Signs: ?Temp: 97.6 ?F (36.4 ?C) (03/06 1558) ?Temp Source: Oral (03/06 1558) ?BP: 132/87 (03/06 1900) ?Pulse Rate: 91 (03/06 1900) ? ?Labs: ?Recent Labs  ?  08/25/2021 ?1615 08/31/2021 ?1652 09/23/2021 ?1652 09/22/2021 ?1804 08/29/21 ?0213 08/29/21 ?1002 08/29/21 ?1832  ?HGB  --  10.5*   < >  --  10.8* 10.6*  --   ?HCT  --  31.0*  --   --  30.9* 29.3*  --   ?PLT  --  145*  --   --  137* 155  --   ?HEPARINUNFRC  --   --   --   --  <0.10* 0.28* 0.36  ?CREATININE 0.64  --   --   --   --  0.57*  --   ?TROPONINIHS 7  --   --  11  --   --   --   ? < > = values in this interval not displayed.  ? ? ? ?Estimated Creatinine Clearance: 64.7 mL/min (A) (by C-G formula based on SCr of 0.57 mg/dL (L)). ? ? ?Medical History: ?Past Medical History:  ?Diagnosis Date  ? Abnormal nuclear stress test   ? Anxiety   ? Cancer [facility])   ? Carotid artery disease ([place])   ? Chronic back pain   ? Chronic lower back pain   ? "L4-5"  ? COPD (chronic obstructive pulmonary disease) ([place])   ? Esophageal cancer ([place])   ? Heavy cigarette smoker (20-39 per day)   ? Unmotivated  ? Hyperlipidemia LDL goal <70   ? Intolerant to statins  ? Hypertension   ? Lumbar disc disease   ? PAD (peripheral artery disease) ([place]) 10/30/2012  ? a) 2011 LEA Dopplers: Left ATA occlusion; b) 10/2013: LEA Dopplers - L Common Iliac & CFA occlusion - w/ short reconstitution at the branch point of the external and internal iliac, the external iliac is occluded through the common femoral and proximal SFA;  reconstitutes in the proximal SFA. Two-vessel runoff beyond.; c) s/p LCIA Stent & CFA EA w/ patch angioplasty --> f/u doppler 12/01/2013 patent  ? Pollen allergies   ? Presence of stent in artery   ? ? ?Assessment: ?Patient presents for altered mental status. Patient with new onset afib. Not on oral anticoagulants. Pharmacy asked to start heparin. Note that recent EGD showed non-bleeding gastric ulcer, GI consult pending.  ? ? ?HL 0.36- therapeutic ? ?Goal of Therapy:  ?Heparin level 0.3-0.7 units/ml ?Monitor platelets by anticoagulation protocol: Yes ?  ?Plan:  ?Continue heparin infusion at 850 units/hr ?Check anti-Xa level in ~6  hours and daily while on heparin ?Continue to monitor H&H and platelets ? ?Margot Ables, PharmD ?Clinical Pharmacist ?08/29/2021 8:08 PM ? ? ?

## 2021-08-30 ENCOUNTER — Encounter (HOSPITAL_COMMUNITY): Admission: EM | Disposition: E | Payer: Self-pay | Source: Home / Self Care | Attending: Family Medicine

## 2021-08-30 DIAGNOSIS — R627 Adult failure to thrive: Secondary | ICD-10-CM | POA: Diagnosis not present

## 2021-08-30 DIAGNOSIS — Z7189 Other specified counseling: Secondary | ICD-10-CM

## 2021-08-30 DIAGNOSIS — K222 Esophageal obstruction: Secondary | ICD-10-CM | POA: Diagnosis not present

## 2021-08-30 DIAGNOSIS — E878 Other disorders of electrolyte and fluid balance, not elsewhere classified: Secondary | ICD-10-CM | POA: Diagnosis not present

## 2021-08-30 DIAGNOSIS — Z515 Encounter for palliative care: Secondary | ICD-10-CM | POA: Diagnosis not present

## 2021-08-30 DIAGNOSIS — I4891 Unspecified atrial fibrillation: Secondary | ICD-10-CM | POA: Diagnosis not present

## 2021-08-30 DIAGNOSIS — R54 Age-related physical debility: Secondary | ICD-10-CM

## 2021-08-30 LAB — CBC
HCT: 29.6 % — ABNORMAL LOW (ref 39.0–52.0)
Hemoglobin: 10.4 g/dL — ABNORMAL LOW (ref 13.0–17.0)
MCH: 31.8 pg (ref 26.0–34.0)
MCHC: 35.1 g/dL (ref 30.0–36.0)
MCV: 90.5 fL (ref 80.0–100.0)
Platelets: 160 10*3/uL (ref 150–400)
RBC: 3.27 MIL/uL — ABNORMAL LOW (ref 4.22–5.81)
RDW: 12.4 % (ref 11.5–15.5)
WBC: 4.9 10*3/uL (ref 4.0–10.5)
nRBC: 0 % (ref 0.0–0.2)

## 2021-08-30 LAB — BASIC METABOLIC PANEL
Anion gap: 13 (ref 5–15)
BUN: <5 mg/dL — ABNORMAL LOW (ref 6–20)
CO2: 22 mmol/L (ref 22–32)
Calcium: 9.1 mg/dL (ref 8.9–10.3)
Chloride: 91 mmol/L — ABNORMAL LOW (ref 98–111)
Creatinine, Ser: 0.66 mg/dL (ref 0.61–1.24)
GFR, Estimated: >60 mL/min (ref >60)
Glucose, Bld: 120 mg/dL — ABNORMAL HIGH (ref 70–99)
Potassium: 3.6 mmol/L (ref 3.5–5.1)
Sodium: 126 mmol/L — ABNORMAL LOW (ref 135–145)

## 2021-08-30 LAB — HEPARIN LEVEL (UNFRACTIONATED)
Heparin Unfractionated: 0.25 IU/mL — ABNORMAL LOW (ref 0.30–0.70)
Heparin Unfractionated: 0.18 IU/mL — ABNORMAL LOW (ref 0.30–0.70)
Heparin Unfractionated: 1.02 IU/mL — ABNORMAL HIGH (ref 0.30–0.70)

## 2021-08-30 LAB — VITAMIN B12: Vitamin B-12: 830 pg/mL (ref 180–914)

## 2021-08-30 LAB — MAGNESIUM: Magnesium: 1.3 mg/dL — ABNORMAL LOW (ref 1.7–2.4)

## 2021-08-30 LAB — AMMONIA: Ammonia: 11 umol/L (ref 9–35)

## 2021-08-30 SURGERY — CARDIOVERSION
Anesthesia: General

## 2021-08-30 MED ORDER — HEPARIN (PORCINE) 25000 UT/250ML-% IV SOLN
1100.0000 [IU]/h | INTRAVENOUS | Status: DC
Start: 2021-08-30 — End: 2021-09-01
  Administered 2021-08-31: 1100 [IU]/h via INTRAVENOUS
  Filled 2021-08-30: qty 250

## 2021-08-30 MED ORDER — THIAMINE HCL 100 MG/ML IJ SOLN
100.0000 mg | Freq: Every day | INTRAMUSCULAR | Status: DC
  Administered 2021-08-30 – 2021-09-05 (×7): 100 mg via INTRAVENOUS
  Filled 2021-08-30 (×7): qty 2

## 2021-08-30 MED ORDER — DEXTROSE-NACL 5-0.9 % IV SOLN
INTRAVENOUS | Status: DC
  Filled 2021-08-30 (×6): qty 1000

## 2021-08-30 MED ORDER — HEPARIN BOLUS VIA INFUSION
2000.0000 [IU] | Freq: Once | INTRAVENOUS | Status: AC
  Administered 2021-08-30: 2000 [IU] via INTRAVENOUS
  Filled 2021-08-30: qty 2000

## 2021-08-30 MED ORDER — POTASSIUM CHLORIDE 2 MEQ/ML IV SOLN: INTRAVENOUS | Status: DC

## 2021-08-30 MED ORDER — HYDROMORPHONE HCL 1 MG/ML IJ SOLN
0.5000 mg | INTRAMUSCULAR | Status: DC | PRN
  Administered 2021-08-30 – 2021-09-02 (×9): 0.5 mg via INTRAVENOUS
  Filled 2021-08-30 (×9): qty 0.5

## 2021-08-30 MED ORDER — MAGNESIUM SULFATE 2 GM/50ML IV SOLN
2.0000 g | Freq: Once | INTRAVENOUS | Status: AC
  Administered 2021-08-30: 2 g via INTRAVENOUS
  Filled 2021-08-30: qty 50

## 2021-08-30 NOTE — Plan of Care (Signed)

## 2021-08-30 NOTE — Progress Notes (Signed)
ANTICOAGULATION CONSULT NOTE  ? ?Pharmacy Consult for Heparin ?Indication: atrial fibrillation ? ?Allergies  ?Allergen Reactions  ? Drug [Tape]   ?  PLEASE USE COBAN ,,,, ADHESIVE TAPE TEARS SKIN ?  ? Glycopyrrolate Other (See Comments)  ?  Intolerance--unknown reaction  ? Warfarin And Related Other (See Comments)  ?  Excessive bleeding  ? ? ?Patient Measurements: ?Height: '5\' 6"'$  (167.6 cm) ?Weight: 46.4 kg (102 lb 4.7 oz) ?IBW/kg (Calculated) : 63.8 ?HEPARIN DW (KG): 46.6  ? ?Vital Signs: ?Temp: 98 ?F (36.7 ?C) (03/07 1554) ?Temp Source: Axillary (03/07 1554) ?BP: 103/61 (03/07 1700) ?Pulse Rate: 82 (03/07 1619) ? ?Labs: ?Recent Labs  ?  09/06/2021 ?1615 09/01/2021 ?1652 09/14/2021 ?1804 08/29/21 ?0213 08/29/21 ?1002 08/29/21 ?1832 08/24/2021 ?0427 09/19/2021 ?1252 09/15/2021 ?2014  ?HGB  --    < >  --  10.8* 10.6*  --  10.4*  --   --   ?HCT  --    < >  --  30.9* 29.3*  --  29.6*  --   --   ?PLT  --    < >  --  137* 155  --  160  --   --   ?HEPARINUNFRC  --    < >  --  <0.10* 0.28*   < > 0.25* 0.18* 1.02*  ?CREATININE 0.64  --   --   --  0.57*  --  0.66  --   --   ?TROPONINIHS 7  --  11  --   --   --   --   --   --   ? < > = values in this interval not displayed.  ? ? ? ?Estimated Creatinine Clearance: 64.4 mL/min (by C-G formula based on SCr of 0.66 mg/dL). ? ? ?Medical History: ?Past Medical History:  ?Diagnosis Date  ? Abnormal nuclear stress test   ? Anxiety   ? Cancer [facility])   ? Carotid artery disease ([place])   ? Chronic back pain   ? Chronic lower back pain   ? "L4-5"  ? COPD (chronic obstructive pulmonary disease) ([place])   ? Esophageal cancer ([place])   ? Heavy cigarette smoker (20-39 per day)   ? Unmotivated  ? Hyperlipidemia LDL goal <70   ? Intolerant to statins  ? Hypertension   ? Lumbar disc disease   ? PAD (peripheral artery disease) ([place]) 10/30/2012  ? a) 2011 LEA Dopplers: Left ATA occlusion; b) 10/2013: LEA Dopplers - L Common Iliac & CFA occlusion - w/ short reconstitution at the branch point of the external and internal  iliac, the external iliac is occluded through the common femoral and proximal SFA; reconstitutes in the proximal SFA. Two-vessel runoff beyond.; c) s/p LCIA Stent & CFA EA w/ patch angioplasty --> f/u doppler 12/01/2013 patent  ? Pollen allergies   ? Presence of stent in artery   ? ? ?Assessment: ?Patient presents for altered mental status. Patient with new onset afib. Not on oral anticoagulants. Pharmacy asked to start heparin. Note that recent EGD showed non-bleeding gastric ulcer, GI consult pending.  ? ?Heparin level elevated, confirmed drawn on opposite arm with RN.  ? ?Goal of Therapy:  ?Heparin level 0.3-0.7 units/ml ?Monitor platelets by anticoagulation protocol: Yes ?  ?Plan:  ?Hold heparin x30 min then reduce to 1000 units/h ?Repeat heparin level in 8h ? ?Arrie Senate, PharmD, BCPS, BCCP ?Clinical Pharmacist ?Please check AMION for all [place] numbers ?09/08/2021 ? ? ?

## 2021-08-30 NOTE — Consult Note (Signed)
? ?                                                                                ?Consultation Note ?Date: 09/16/2021  ? ?Patient Name: Jimmy Tucker  ?DOB: 22-Sep-1960  MRN: [id number]  Age / Sex: 61 y.o., male  ?PCP: [name], MD ?Referring Physician: Barton Dubois, MD ? ?Reason for Consultation: Goals of care and advanced directives ? ?HPI/Patient Profile: 61 y.o. male  with past medical history of esophageal cancer s/p esophagectomy complicated by leaks and strictures, CAD, HTN, COPD, alcohol abuse- has had multiple dilatations, admitted on 09/07/2021 with confusion, poor oral intake, weakness, falls.  Work-up reveals A-fib with RVR-converted to sinus rhythm-plan to convert to p.o. meds when possible, failure to thrive-GI has been consulted-he is n.p.o.-electrolyte abnormalities due to poor oral intake.  Palliative medicine consulted for goals of care and advanced directive. ? ?Primary Decision Maker ?NEXT OF KIN -spouse, Lyndal Pulley ? ?Discussion: ?I have reviewed medical records including EPIC notes, labs and imaging, received report from RN and MD, assessed the patient and then met at the bedside along with him, his daughter and multiple other family members to discuss diagnosis prognosis, [place], EOL wishes, disposition and options. ? ?I introduced Palliative Medicine as specialized medical care for people living with serious illness. It focuses on providing relief from the symptoms and stress of a serious illness. The goal is to improve quality of life for both the patient and the family. ? ?Evaluated patient-he was alert but not oriented he needed reminders for place was not oriented to time.  He could only tell me that he was in the hospital due to his esophagus.  He could not have involved goals of care discussion due to his confusion. ? ?We discussed a brief life review of the patient and then focused on their current illness.  He lives at home with his wife.  6 weeks ago he was ambulatory and able to make  his own food.  He has had significant decline especially since his last dilation which was 2 weeks ago.  His current functional status is at a point where he primarily sits in his lounge chair, he does not get up to the toilet he uses a urinal, he is not able to eat or drink. ? ?I attempted to elicit values and goals of care important to the patient.  Family expresses that current goals of care are to try and get him stronger, get him to eat again and to be more independent and live longer. ? ?Advanced directives, concepts specific to code status, artifical feeding and hydration, and rehospitalization were considered and discussed.  Geoffry does not have advanced directives, family is interested in getting them completed.  I explained that unfortunately due to his confusion we cannot do advanced directives at this time but that his spouse is automatic surrogate decision maker based on [place] general statutes.  His family does express that he would not want to be on a ventilator, however they would be open to a feeding tube if this were going to accomplish the goal of giving him nutrition and increasing his strength and independence. ? ?We had a discussion  regarding CODE STATUS-they note that [name] had expressed in the past he would not want to be on a ventilator again.  We fairly discussed that due to his very frail state undergoing CPR in the event that he had no pulse and was not breathing would not restore him to an independent state or functional state better than he currently is.  All present agreed that a DO NOT RESUSCITATE order was appropriate and I informed them that I would place this while he is inpatient. ? ?Discussed the importance of continued conversation with family and the medical providers regarding overall plan of care and treatment options, ensuring decisions are within the context of the patient?s values and GOCs.   ? ? ? ?SUMMARY OF RECOMMENDATIONS ?-Failure to thrive in the setting of history of  esophageal cancer with multiple strictures which inhibit his ability to take an oral hydration and nutrition-current goals of medical care are to attempt to provide patient with nutrition and therapy for strengthening and try to get him back to living independently-they are interested in GI consult-they are agreeable to a feeding tube he has had 1 in the past, DNR. ? ?Code Status/Advance Care Planning: ?DNR ? ? ?Prognosis:   ?Unable to determine ? ?Discharge Planning: To Be Determined ? ?Primary Diagnoses: ?Present on Admission: ? Atrial fibrillation with rapid ventricular response ([place]) ? Hypertension ? Failure to thrive in adult ? ? ?Review of Systems ? ?Physical Exam ? ?Vital Signs: BP 135/69   Pulse (!) 40   Temp (!) 97.2 ?F (36.2 ?C) (Axillary)   Resp (!) 29   Ht 5' 6"  (1.676 m)   Wt 46.4 kg   SpO2 (!) 80%   BMI 16.51 kg/m?  ?Pain Scale: 0-10 ?  ?Pain Score: Asleep ? ? ?SpO2: SpO2: (!) 80 % ?O2 Device:SpO2: (!) 80 % ?O2 Flow Rate: .  ? ?IO: Intake/output summary:  ?Intake/Output Summary (Last 24 hours) at 09/17/2021 1015 ?Last data filed at 09/22/2021 0601 ?Gross per 24 hour  ?Intake 2120.3 ml  ?Output 1350 ml  ?Net 770.3 ml  ? ? ?LBM: Last BM Date :  (PTA) ?Baseline Weight: Weight: 49.9 kg (per family) ?Most recent weight: Weight: 46.4 kg     ?P ? ? ?Thank you for this consult. Palliative medicine will continue to follow and assist as needed.  ? ?Signed by: ?Mariana Kaufman, AGNP-C ?Palliative Medicine ? ?  ?Please contact Palliative Medicine Team phone at [phone] for questions and concerns.  ?For individual provider: See Amion ? ? ? ? ? ? ? ? ? ? ? ? ? ? ?

## 2021-08-30 NOTE — Progress Notes (Signed)
ANTICOAGULATION CONSULT NOTE  ? ?Pharmacy Consult for Heparin ?Indication: atrial fibrillation ? ?Allergies  ?Allergen Reactions  ? Drug [Tape]   ?  PLEASE USE COBAN ,,,, ADHESIVE TAPE TEARS SKIN ?  ? Glycopyrrolate Other (See Comments)  ?  Intolerance--unknown reaction  ? Warfarin And Related Other (See Comments)  ?  Excessive bleeding  ? ? ?Patient Measurements: ?Height: '5\' 6"'$  (167.6 cm) ?Weight: 46.4 kg (102 lb 4.7 oz) ?IBW/kg (Calculated) : 63.8 ?HEPARIN DW (KG): 46.6  ? ?Vital Signs: ?Temp: 97.5 ?F (36.4 ?C) (03/07 1136) ?Temp Source: Oral (03/07 1136) ?BP: 135/69 (03/07 0900) ?Pulse Rate: 40 (03/07 0800) ? ?Labs: ?Recent Labs  ?  09/06/2021 ?1615 09/18/2021 ?1652 09/16/2021 ?1804 08/29/21 ?0213 08/29/21 ?1002 08/29/21 ?1832 09/18/2021 ?0427 09/10/2021 ?1252  ?HGB  --    < >  --  10.8* 10.6*  --  10.4*  --   ?HCT  --    < >  --  30.9* 29.3*  --  29.6*  --   ?PLT  --    < >  --  137* 155  --  160  --   ?HEPARINUNFRC  --    < >  --  <0.10* 0.28* 0.36 0.25* 0.18*  ?CREATININE 0.64  --   --   --  0.57*  --  0.66  --   ?TROPONINIHS 7  --  11  --   --   --   --   --   ? < > = values in this interval not displayed.  ? ? ? ?Estimated Creatinine Clearance: 64.4 mL/min (by C-G formula based on SCr of 0.66 mg/dL). ? ? ?Medical History: ?Past Medical History:  ?Diagnosis Date  ? Abnormal nuclear stress test   ? Anxiety   ? Cancer [facility])   ? Carotid artery disease ([place])   ? Chronic back pain   ? Chronic lower back pain   ? "L4-5"  ? COPD (chronic obstructive pulmonary disease) ([place])   ? Esophageal cancer ([place])   ? Heavy cigarette smoker (20-39 per day)   ? Unmotivated  ? Hyperlipidemia LDL goal <70   ? Intolerant to statins  ? Hypertension   ? Lumbar disc disease   ? PAD (peripheral artery disease) ([place]) 10/30/2012  ? a) 2011 LEA Dopplers: Left ATA occlusion; b) 10/2013: LEA Dopplers - L Common Iliac & CFA occlusion - w/ short reconstitution at the branch point of the external and internal iliac, the external iliac is occluded through  the common femoral and proximal SFA; reconstitutes in the proximal SFA. Two-vessel runoff beyond.; c) s/p LCIA Stent & CFA EA w/ patch angioplasty --> f/u doppler 12/01/2013 patent  ? Pollen allergies   ? Presence of stent in artery   ? ? ?Assessment: ?Patient presents for altered mental status. Patient with new onset afib. Not on oral anticoagulants. Pharmacy asked to start heparin. Note that recent EGD showed non-bleeding gastric ulcer, GI consult pending.  ? ? ?HL 0.18- subtherapeutic ?CBC stable overnight, no bleeding noted.  ? ?Goal of Therapy:  ?Heparin level 0.3-0.7 units/ml ?Monitor platelets by anticoagulation protocol: Yes ?  ?Plan:  ?Heparin bolus 2000 units x 1 ?Increase heparin infusion to 1250 units/hr ?Check anti-Xa level in 6 hours ?Continue to monitor H&H and platelets ? ?Donna Christen Myquan [place], PharmD, MBA ?Clinical Pharmacist ? ?09/04/2021 2:34 PM ? ?

## 2021-08-30 NOTE — Progress Notes (Signed)
?Progress Note ? ? ?Patient: Jimmy Tucker JHE:[id number] DOB: 03-09-1961 DOA: 09/15/2021     2 ?DOS: the patient was seen and examined on 09/08/2021 ?  ?Brief hospital admission narrative course ?As per H&P written by Dr. Denton Brick on 09/10/2021 ?Jimmy Tucker is a 61 y.o. male with medical history significant for esophageal cancer, hypertension, coronary artery disease, atrial fibrillation, COPD, alcohol abuse. ?Patient was brought to the ED from home with reports of multiple complaints.  Family-spouse and daughter are present at bedside and assist with the history.  They report confusion today.  But over the past several days, since patient had his esophageal dilatation 2/22, he reports poor oral intake, has barely eaten, neurolysed weakness, patient unable to stand over the past few days, cannot walk.  He reports feeling dizzy a week ago, resulting in a fall twice that day.  Reports a feeling of panic attack today with transient difficulty breathing.  But denies specific palpitations.  He denies chest pain. ?  ?ED Course: Tachycardic, heart rate up to 165 on EKG.  Temperature 97.6.  Respiratory rate 17-32.  Blood pressure systolic initially 185/63.  Sodium 124.  Potassium 3.  Magnesium 1.8.  TSH 4.699.  Troponin 7 > 11 EKG shows atrial fibrillation with RVR.  He was given Cardizem with subsequent drop in blood pressure to 74/64.  1 L bolus was given.  Cardizem discontinued.  ED provider talked to cardiology, okay with amiodarone and heparin drip.  And cardiology team to see in the morning. ? ?Assessment and Plan: ?* Atrial fibrillation with rapid ventricular response ([place]) ?-He has a history of paroxysmal A-fib for which he was on anticoagulation with Xarelto per notes from 2020.   ?-Normal TSH ?-2D echo demonstrating grade 1 diastolic dysfunction; no wall motion abnormalities and preserved ejection fraction. ?-While attempting Cardizem drip blood pressure drops and patient became hypotensive at time of admission  initially.. ?-Following cardiology recommendations amiodarone drip and Lopressor has been used; patient has now cardiovert to sinus rhythm. ?-Short-term and anticoagulation with heparin drip; patient is not a candidate for long-term anticoagulation.   ?-Continue to follow cardiology service recommendations.   ?-CHADsVASC score 2-3 ? ?Fall at home, initial encounter ?-As already mentioned once medically stable will request physical therapy evaluation. ?-B12, ammonia level and TSH within normal limits. ?-Family reporting difficulty sleeping and chronic use of pain medications and benzodiazepines as an outpatient. ?-Continue to be intermittently disoriented. ? ?Electrolyte abnormality ?-Patient with hyponatremia, hypokalemia, hypomagnesemia and hypophosphatemia ?-Continue to follow electrolytes trend and further replete them as needed ?-Continue telemetry monitoring. ?-Potassium and phosphorus has been repleted; will provide IV magnesium and follow electrolytes stability. ?-Gentle fluid resuscitation with potassium will be also provided for maintenance. ? ?Failure to thrive in adult ?-Patient with significant quality decline and multiple falls at home in the setting of failure to thrive, deconditioning and poor balance. ?-Chest CTA negative for PE, suggest nonspecific infectious or inflammatory bronchitis.  He is afebrile and without leukocytosis. ?-CT head without acute intracranial abnormalities. ?-Will request physical therapy evaluation once medically stable. ?-Overall poor prognosis and guarded condition; palliative care has been consulted and will follow results of goals of care discussion.. ? ?Esophageal stricture ?-With failure to thrive.  Recurrent issue for patient,, has had multiple EGDs and dilatation related to esophageal cancer for which he had surgery in the past.  ?-Gastroenterology service has been consulted and will follow recommendations.  ?-Continue n.p.o. status for now.  ?-Continue  PPI. ? ? ?Hypertension ?-Soft blood  pressure at time of presentation in the setting of arrhythmia and Cardizem drip ?-Now stable. ?-Continue Lopressor as recommended by cardiology service and as needed hydralazine. ? ?Stage II pressure injury:  ?-Appreciated in left buttocks present at time of admission ?-Continue constant repositioning and barrier measures. ? ?Metabolic encephalopathy ?-In the setting of hospital-acquired delirium and most likely mild withdrawal from chronic pain medications and benzos that he normally uses as an outpatient. ?-Restarting as needed IV Dilaudid for chronic pain management ?-Patient with adverse reactions to the use of lorazepam ?-Planning to resume home Xanax when able to take by mouth. ? ?Subjective:  ?Continue to be intermittently disoriented; no chest pain, no nausea, no vomiting.  Currently sinus rhythm and rate controlled.  Receiving IV amiodarone, IV Lopressor and heparin drip.  Still with electrolytes and normalities and pending GI endoscopic evaluation. ? ?Physical Exam: ?Vitals:  ? 09/04/2021 0800 09/20/2021 0900 09/23/2021 1136 09/23/2021 1554  ?BP: (!) 151/128 135/69    ?Pulse: (!) 40     ?Resp: (!) 30 (!) 29    ?Temp:   (!) 97.5 ?F (36.4 ?C) 98 ?F (36.7 ?C)  ?TempSrc:   Oral Axillary  ?SpO2: (!) 80%     ?Weight:      ?Height:      ? ?General exam: Alert, awake, oriented x1 (only to person) intermittently disoriented; able to follow simple commands.  No chest pain, no abdominal pain.  Complaining of chronic back discomfort. ?Respiratory system: Clear to auscultation. Respiratory effort normal.  Good saturation on room air; no using accessory muscles. ?Cardiovascular system:Rate controlled and currently sinus rhythm; no rubs, no gallops, no JVD. ?Gastrointestinal system: Abdomen is nondistended, soft and nontender. No organomegaly or masses felt. Normal bowel sounds heard. ?Central nervous system: Alert and oriented. No focal neurological deficits. ?Extremities: No cyanosis or  clubbing. ?Skin: No petechiae. ?Psychiatry: Judgement and insight appear impaired secondary to encephalopathic process. ? ?Data Reviewed: ?CBC demonstrating hemoglobin of 10.4; WBCs 4.9 normal platelets count.   ?Normal B12, ammonia and TSH. ?Magnesium 1.3 ?Sodium 126, potassium 3.6, normal creatinine and bicarb 22. ?CT scan of the head without acute intracranial abnormality; CT angiogram of the chest without findings of PE. ? ?2D echo: With preserved ejection fraction, no wall motion abnormality; grade 1 diastolic dysfunction. ? ?Family Communication: Wife daughter at bedside. ? ?Disposition: ?Status is: Inpatient ? ?Remains inpatient appropriate because: In need of IV therapy to control A-fib with RVR and also pending evaluation from gastroenterology standpoint due to inability to tolerate diet and oral meds. ? ? Planned Discharge Destination: To be determined currently; physical therapy evaluation to be done once medically stable. ? ? ?Author: ?Barton Dubois, MD ?08/27/2021 4:08 PM ? ?For on call review [email or web address].  ?

## 2021-08-30 NOTE — Progress Notes (Signed)
Progress Note  Patient Name: Jimmy Tucker Date of Encounter: 09/04/2021  Primary Cardiologist: None   Subjective   Patient converted to SR overnight. He is still having swallowing issue; family notes this is a long standing issue Patient notes he is cold.  Does not engage in most of our conversation.  Inpatient Medications    Scheduled Meds:  chlorhexidine  15 mL Mouth Rinse BID   Chlorhexidine Gluconate Cloth  6 each Topical Q0600   mouth rinse  15 mL Mouth Rinse q12n4p   metoprolol tartrate  2.5 mg Intravenous Q8H   pantoprazole (PROTONIX) IV  40 mg Intravenous Q12H   Continuous Infusions:  amiodarone 30 mg/hr (08/29/21 1951)   heparin 950 Units/hr (08/29/2021 0601)   PRN Meds: acetaminophen **OR** acetaminophen, hydrALAZINE, ipratropium-albuterol, ondansetron **OR** ondansetron (ZOFRAN) IV, polyethylene glycol   Vital Signs    Vitals:   09/14/2021 0500 09/10/2021 0530 09/21/2021 0600 09/10/2021 0733  BP: (!) 151/75 (!) 152/110 (!) 172/86   Pulse: 80  87   Resp: '18 20 20 '$ (!) 28  Temp:    (!) 97.2 F (36.2 C)  TempSrc:    Axillary  SpO2: 99%  100%   Weight: 46.4 kg     Height:        Intake/Output Summary (Last 24 hours) at 08/24/2021 0914 Last data filed at 09/18/2021 0601 Gross per 24 hour  Intake 2120.3 ml  Output 1350 ml  Net 770.3 ml   Filed Weights   09/16/2021 1508 08/29/21 1229 09/22/2021 0500  Weight: 49.9 kg 46.6 kg 46.4 kg    Telemetry    AF-> SR - Personally Reviewed  Physical Exam   Gen: Mild distress, thin and malnourished   Neck: No JVD,  Cardiac: No Rubs or Gallops, RRR +2 pulses no murmur Respiratory: Clear to auscultation bilaterally, does not inspire with effort and speaks through exam, normal  respiratory rate GI: Soft, nontender, non-distended  MS: No  edema;  moves all extremities Integument: Skin feels warm Neuro:  At time of evaluation, alert and oriented to person recognizes family and to place; R hand asterixis during exam Psych:  Confused affect   Labs    Chemistry Recent Labs  Lab 09/04/2021 1615 08/29/21 1002 09/01/2021 0427  NA 124* 127* 126*  K 3.0* 3.1* 3.6  CL 84* 93* 91*  CO2 23 21* 22  GLUCOSE 116* 156* 120*  BUN 10 6 <5*  CREATININE 0.64 0.57* 0.66  CALCIUM 9.8 9.1 9.1  PROT 7.9  --   --   ALBUMIN 3.5  --   --   AST 23  --   --   ALT 14  --   --   ALKPHOS 57  --   --   BILITOT 3.0*  --   --   GFRNONAA >60 >60 >60  ANIONGAP 17* 13 13     Hematology Recent Labs  Lab 08/29/21 0213 08/29/21 1002 09/01/2021 0427  WBC 4.8 5.2 4.9  RBC 3.28* 3.09* 3.27*  HGB 10.8* 10.6* 10.4*  HCT 30.9* 29.3* 29.6*  MCV 94.2 94.8 90.5  MCH 32.9 34.3* 31.8  MCHC 35.0 36.2* 35.1  RDW 12.5 12.6 12.4  PLT 137* 155 160    Cardiac EnzymesNo results for input(s): TROPONINI in the last 168 hours. No results for input(s): TROPIPOC in the last 168 hours.   BNP Recent Labs  Lab 09/14/2021 1625  BNP 105.0*     DDimer  Recent Labs  Lab 09/07/2021 1615  DDIMER 1.27*     Radiology    CT Head Wo Contrast  Result Date: 09/14/2021 CLINICAL DATA:  Altered mental status. EXAM: CT HEAD WITHOUT CONTRAST TECHNIQUE: Contiguous axial images were obtained from the base of the skull through the vertex without intravenous contrast. RADIATION DOSE REDUCTION: This exam was performed according to the departmental dose-optimization program which includes automated exposure control, adjustment of the mA and/or kV according to patient size and/or use of iterative reconstruction technique. COMPARISON:  February 11, 2007 FINDINGS: Brain: There is mild to moderate severity cerebral atrophy with widening of the extra-axial spaces and ventricular dilatation. There are areas of decreased attenuation within the white matter tracts of the supratentorial brain, consistent with microvascular disease changes. Vascular: No hyperdense vessel or unexpected calcification. Skull: Negative for an acute fracture. A chronic fracture of the left zygomatic  arch is seen. Sinuses/Orbits: No acute finding. Other: None. IMPRESSION: Generalized cerebral atrophy without evidence of an acute intracranial abnormality. Electronically Signed   By: Virgina Norfolk M.D.   On: 08/24/2021 20:29   CT Angio Chest PE W and/or Wo Contrast  Result Date: 08/27/2021 CLINICAL DATA:  PE suspected, shortness of breath, confusion, history of esophageal cancer EXAM: CT ANGIOGRAPHY CHEST WITH CONTRAST TECHNIQUE: Multidetector CT imaging of the chest was performed using the standard protocol during bolus administration of intravenous contrast. Multiplanar CT image reconstructions and MIPs were obtained to evaluate the vascular anatomy. RADIATION DOSE REDUCTION: This exam was performed according to the departmental dose-optimization program which includes automated exposure control, adjustment of the mA and/or kV according to patient size and/or use of iterative reconstruction technique. CONTRAST:  33m OMNIPAQUE IOHEXOL 350 MG/ML SOLN COMPARISON:  10/20/2014 FINDINGS: Cardiovascular: Satisfactory opacification of the pulmonary arteries to the segmental level. No evidence of pulmonary embolism. Normal heart size. Extensive three-vessel coronary artery calcifications and/or stents. No pericardial effusion. Aortic atherosclerosis Mediastinum/Nodes: No enlarged mediastinal, hilar, or axillary lymph nodes. Status post pull-through esophagectomy. Thyroid gland and trachea demonstrate no significant findings. Lungs/Pleura: Mild, diffuse bilateral bronchial wall thickening. Bandlike scarring of the bilateral lung bases. No pleural effusion or pneumothorax. Upper Abdomen: No acute abnormality. Musculoskeletal: No chest wall abnormality. No acute osseous findings. Review of the MIP images confirms the above findings. IMPRESSION: 1. Negative examination for pulmonary embolism. 2. Mild, diffuse bilateral bronchial wall thickening, consistent with nonspecific infectious or inflammatory bronchitis. 3.  Status post pull-through esophagectomy. No evidence of malignant recurrence in the chest. 4. Coronary artery disease. Aortic Atherosclerosis (ICD10-I70.0). Electronically Signed   By: ADelanna AhmadiM.D.   On: 08/31/2021 20:34   DG Chest Port 1 View  Result Date: 09/06/2021 CLINICAL DATA:  shortness of breath EXAM: PORTABLE CHEST 1 VIEW COMPARISON:  March 05, 2021 FINDINGS: The cardiomediastinal silhouette is unchanged in contour.Atherosclerotic calcifications of the aorta. Unchanged blunting of the RIGHT costophrenic angle. No large pleural effusion. No pneumothorax. No acute pleuroparenchymal abnormality. Visualized abdomen is unremarkable. IMPRESSION: No acute cardiopulmonary abnormality. Electronically Signed   By: SValentino SaxonM.D.   On: 08/29/2021 16:25   ECHOCARDIOGRAM COMPLETE  Result Date: 08/29/2021    ECHOCARDIOGRAM REPORT   Patient Name:   Rockne T SMadagascarDate of Exam: 08/29/2021 Medical Rec #:  [id number]    Height:       66.0 in Accession #:    [id number]   Weight:       110.0 lb Date of Birth:  4Jul 22, 1962    BSA:  1.551 m Patient Age:    60 years      BP:           153/95 mmHg Patient Gender: M             HR:           100 bpm. Exam Location:  [name] Procedure: 2D Echo, Cardiac Doppler and Color Doppler Indications:    Atrial Fibrillation  History:        Patient has no prior history of Echocardiogram examinations.                 CAD, PAD, Arrythmias:Atrial Fibrillation; Risk                 Factors:Hypertension, Dyslipidemia and Current Smoker. Alcohol                 abuse.  Sonographer:    Wenda Low Referring Phys: [phone] Leanne Chang [facility]  Sonographer Comments: Technically difficult study due to poor echo windows. Image acquisition challenging due to patient body habitus. IMPRESSIONS  1. Patient in NSR during exam . Left ventricular ejection fraction, by estimation, is 60 to 65%. The left ventricle has normal function. The left ventricle has no regional wall  motion abnormalities. There is mild left ventricular hypertrophy. Left ventricular diastolic parameters are consistent with Grade I diastolic dysfunction (impaired relaxation).  2. Right ventricular systolic function is normal. The right ventricular size is normal. There is severely elevated pulmonary artery systolic pressure.  3. The mitral valve is abnormal. Trivial mitral valve regurgitation. No evidence of mitral stenosis.  4. The aortic valve is tricuspid. There is mild calcification of the aortic valve. There is mild thickening of the aortic valve. Aortic valve regurgitation is not visualized. Aortic valve sclerosis is present, with no evidence of aortic valve stenosis.  5. The inferior vena cava is normal in size with greater than 50% respiratory variability, suggesting right atrial pressure of 3 mmHg. FINDINGS  Left Ventricle: Patient in NSR during exam. Left ventricular ejection fraction, by estimation, is 60 to 65%. The left ventricle has normal function. The left ventricle has no regional wall motion abnormalities. The left ventricular internal cavity size was normal in size. There is mild left ventricular hypertrophy. Left ventricular diastolic parameters are consistent with Grade I diastolic dysfunction (impaired relaxation). Right Ventricle: The right ventricular size is normal. No increase in right ventricular wall thickness. Right ventricular systolic function is normal. There is severely elevated pulmonary artery systolic pressure. The tricuspid regurgitant velocity is 4.14 m/s, and with an assumed right atrial pressure of 8 mmHg, the estimated right ventricular systolic pressure is 12.4 mmHg. Left Atrium: Left atrial size was normal in size. Right Atrium: Right atrial size was normal in size. Pericardium: There is no evidence of pericardial effusion. Mitral Valve: The mitral valve is abnormal. There is mild thickening of the mitral valve leaflet(s). There is mild calcification of the mitral valve  leaflet(s). Mild mitral annular calcification. Trivial mitral valve regurgitation. No evidence of mitral valve stenosis. MV peak gradient, 3.5 mmHg. The mean mitral valve gradient is 2.0 mmHg. Tricuspid Valve: The tricuspid valve is normal in structure. Tricuspid valve regurgitation is not demonstrated. No evidence of tricuspid stenosis. Aortic Valve: The aortic valve is tricuspid. There is mild calcification of the aortic valve. There is mild thickening of the aortic valve. Aortic valve regurgitation is not visualized. Aortic valve sclerosis is present, with no evidence of aortic valve stenosis. Aortic valve mean  gradient measures 1.0 mmHg. Aortic valve peak gradient measures 3.5 mmHg. Aortic valve area, by VTI measures 2.40 cm. Pulmonic Valve: The pulmonic valve was normal in structure. Pulmonic valve regurgitation is not visualized. No evidence of pulmonic stenosis. Aorta: The aortic root is normal in size and structure. Venous: The inferior vena cava is normal in size with greater than 50% respiratory variability, suggesting right atrial pressure of 3 mmHg. IAS/Shunts: The interatrial septum was not well visualized.  LEFT VENTRICLE PLAX 2D LVIDd:         3.40 cm   Diastology LVIDs:         2.30 cm   LV e' medial:    7.51 cm/s LV PW:         1.20 cm   LV E/e' medial:  8.8 LV IVS:        1.20 cm   LV e' lateral:   7.29 cm/s LVOT diam:     1.90 cm   LV E/e' lateral: 9.1 LV SV:         41 LV SV Index:   27 LVOT Area:     2.84 cm  RIGHT VENTRICLE RV Basal diam:  2.95 cm RV Mid diam:    3.00 cm RV S prime:     14.00 cm/s TAPSE (M-mode): 2.3 cm LEFT ATRIUM         Index       RIGHT ATRIUM           Index LA diam:    2.90 cm 1.87 cm/m  RA Area:     10.50 cm                                 RA Volume:   18.00 ml  11.60 ml/m  AORTIC VALVE                    PULMONIC VALVE AV Area (Vmax):    2.38 cm     PV Vmax:       0.77 m/s AV Area (Vmean):   2.55 cm     PV Peak grad:  2.4 mmHg AV Area (VTI):     2.40 cm AV Vmax:            94.00 cm/s AV Vmean:          53.100 cm/s AV VTI:            0.171 m AV Peak Grad:      3.5 mmHg AV Mean Grad:      1.0 mmHg LVOT Vmax:         79.00 cm/s LVOT Vmean:        47.800 cm/s LVOT VTI:          0.145 m LVOT/AV VTI ratio: 0.85  AORTA Ao Root diam: 3.30 cm MITRAL VALVE               TRICUSPID VALVE MV Area (PHT): 3.97 cm    TR Peak grad:   68.6 mmHg MV Area VTI:   1.83 cm    TR Vmax:        414.00 cm/s MV Peak grad:  3.5 mmHg MV Mean grad:  2.0 mmHg    SHUNTS MV Vmax:       0.93 m/s    Systemic VTI:  0.14 m MV Vmean:      62.7 cm/s   Systemic Diam: 1.90  cm MV Decel Time: 191 msec MV E velocity: 66.00 cm/s MV A velocity: 82.30 cm/s MV E/A ratio:  0.80 Jenkins Rouge MD Electronically signed by Jenkins Rouge MD Signature Date/Time: 08/29/2021/12:17:46 PM    Final       Patient Profile     61 y.o. 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, esophageal cancer with prior dilations and falls not on chronic AC, COPD, PAD with prior left femoral endarectomy presenting with AMS  Assessment & Plan   PAF Moderate non obstructive CAD, alcohol misuse,  esophageal cancer with prior dilations Multiple falls COPD PAD   - CHADSVASC 1 - presently on AC in the setting of chemical cardioversion (was on amiodarone) who cannot swallow - given his falls, and dysphagia, he would be a poor long term candidate for [facility] and ASA would be advised - short term would benefit from 30 days of [facility] post cardioversion if tolerated - his esophageal cancer and dysphagia are a large burden of his illness presently, if he needs to stop heparin to for procedural intervention this  may be reasonable; I have discusses the risks and benefits of this with family - he is on low dose IV BB, will need conversion to PO meds  - given the above he has a high risk of overall morbidity,  After review with family there are no barriers to full goals of care based on their wishes    For  questions or updates, please contact [facility] Please consult [email or web address] for contact info under Cardiology/STEMI.      Signed, Werner Lean, MD  09/19/2021, 9:14 AM

## 2021-08-30 NOTE — Progress Notes (Signed)
ANTICOAGULATION CONSULT NOTE  ? ?Pharmacy Consult for Heparin ?Indication: atrial fibrillation ? ?Allergies  ?Allergen Reactions  ? Drug [Tape]   ?  PLEASE USE COBAN ,,,, ADHESIVE TAPE TEARS SKIN ?  ? Glycopyrrolate Other (See Comments)  ?  Intolerance--unknown reaction  ? Warfarin And Related Other (See Comments)  ?  Excessive bleeding  ? ? ?Patient Measurements: ?Height: '5\' 6"'$  (167.6 cm) ?Weight: 46.6 kg (102 lb 11.8 oz) ?IBW/kg (Calculated) : 63.8 ?HEPARIN DW (KG): 46.6  ? ?Vital Signs: ?BP: 154/96 (03/07 0230) ?Pulse Rate: 64 (03/07 0230) ? ?Labs: ?Recent Labs  ?  09/10/2021 ?1615 09/18/2021 ?1652 08/26/2021 ?1804 08/29/21 ?0213 08/29/21 ?1002 08/29/21 ?1832 09/23/2021 ?0427  ?HGB  --    < >  --  10.8* 10.6*  --  10.4*  ?HCT  --    < >  --  30.9* 29.3*  --  29.6*  ?PLT  --    < >  --  137* 155  --  160  ?HEPARINUNFRC  --    < >  --  <0.10* 0.28* 0.36 0.25*  ?CREATININE 0.64  --   --   --  0.57*  --   --   ?TROPONINIHS 7  --  11  --   --   --   --   ? < > = values in this interval not displayed.  ? ? ? ?Estimated Creatinine Clearance: 64.7 mL/min (A) (by C-G formula based on SCr of 0.57 mg/dL (L)). ? ? ?Medical History: ?Past Medical History:  ?Diagnosis Date  ? Abnormal nuclear stress test   ? Anxiety   ? Cancer [facility])   ? Carotid artery disease ([place])   ? Chronic back pain   ? Chronic lower back pain   ? "L4-5"  ? COPD (chronic obstructive pulmonary disease) ([place])   ? Esophageal cancer ()   ? Heavy cigarette smoker (20-39 per day)   ? Unmotivated  ? Hyperlipidemia LDL goal <70   ? Intolerant to statins  ? Hypertension   ? Lumbar disc disease   ? PAD (peripheral artery disease) ([place]) 10/30/2012  ? a) 2011 LEA Dopplers: Left ATA occlusion; b) 10/2013: LEA Dopplers - L Common Iliac & CFA occlusion - w/ short reconstitution at the branch point of the external and internal iliac, the external iliac is occluded through the common femoral and proximal SFA; reconstitutes in the proximal SFA. Two-vessel runoff beyond.; c) s/p  LCIA Stent & CFA EA w/ patch angioplasty --> f/u doppler 12/01/2013 patent  ? Pollen allergies   ? Presence of stent in artery   ? ? ?Assessment: ?Patient presents for altered mental status. Patient with new onset afib. Not on oral anticoagulants. Pharmacy asked to start heparin. Note that recent EGD showed non-bleeding gastric ulcer, GI consult pending.  ? ? ?HL 0.25- subtherapeutic ?CBC stable overnight, no bleeding noted.  ? ?Goal of Therapy:  ?Heparin level 0.3-0.7 units/ml ?Monitor platelets by anticoagulation protocol: Yes ?  ?Plan:  ?Increase heparin infusion to 950 units/hr ?Check anti-Xa level daily while on heparin ?Continue to monitor H&H and platelets ? ?Erin Hearing PharmD., BCPS ?Clinical Pharmacist ?09/14/2021 5:52 AM ? ?

## 2021-08-30 NOTE — Progress Notes (Signed)
Gastroenterology Progress Note   Referring Provider: No ref. provider found Primary Care Physician:  [name], MD Primary Gastroenterologist:  Dr. Jenetta Downer  Patient ID: Jimmy Tucker; [id number]; 06/29/1960    Subjective   Patient remains confused. He denies any abdominal pain. No diarrhea. No N/V. Many family members at bedside including wife, brother, niece/nephews, grand daughters. Daughter provided history that he has remained labile in his mood and has been having hallucinations, did not sleep last night, and has not had any Bms.    Objective   Vital signs in last 24 hours Temp:  [97.2 F (36.2 C)-97.9 F (36.6 C)] 97.2 F (36.2 C) (03/07 0733) Pulse Rate:  [29-153] 87 (03/07 0600) Resp:  [13-30] 28 (03/07 0733) BP: (83-206)/(56-125) 172/86 (03/07 0600) SpO2:  [72 %-100 %] 100 % (03/07 0600) Weight:  [46.4 kg-46.6 kg] 46.4 kg (03/07 0500) Last BM Date :  (PTA)  Physical Exam General:   Alert, thin, cooperative Head:  Normocephalic and atraumatic. Eyes:  No icterus, sclera clear. Conjuctiva pink.  Mouth:  poor dentition .  Heart:  NSR, S1,S2, occasional missed beats Lungs: Clear to auscultation bilaterally, without wheezing, rales, or rhonchi.  Abdomen:  Bowel sounds present, soft, non-tender, non-distended. No HSM or hernias noted. No rebound or guarding. No masses appreciated  Msk:  Symmetrical without gross deformities. Weak. Extremities:  Without clubbing or edema. Neurologic:  Alert and oriented to person and place; disoriented to time and situation Skin:  multiple bruises, some skin tears - wrapped in kerlix Cervical Nodes:  No significant cervical adenopathy. Psych:  Alert and cooperative. Labile mood. Delusions/hallucinations  Intake/Output from previous day: 03/06 0701 - 03/07 0700 In: 2120.3 [I.V.:1610.3; IV Piggyback:510] Out: 1350 [OHYWV:3710] Intake/Output this shift: No intake/output data recorded.  Lab Results  Recent Labs     08/29/21 0213 08/29/21 1002 09/14/2021 0427  WBC 4.8 5.2 4.9  HGB 10.8* 10.6* 10.4*  HCT 30.9* 29.3* 29.6*  PLT 137* 155 160   BMET Recent Labs    09/16/2021 1615 08/29/21 1002 09/04/2021 0427  NA 124* 127* 126*  K 3.0* 3.1* 3.6  CL 84* 93* 91*  CO2 23 21* 22  GLUCOSE 116* 156* 120*  BUN 10 6 <5*  CREATININE 0.64 0.57* 0.66  CALCIUM 9.8 9.1 9.1   LFT Recent Labs    09/12/2021 1615  PROT 7.9  ALBUMIN 3.5  AST 23  ALT 14  ALKPHOS 57  BILITOT 3.0*   PT/INR No results for input(s): LABPROT, INR in the last 72 hours. Hepatitis Panel No results for input(s): HEPBSAG, HCVAB, HEPAIGM, HEPBIGM in the last 72 hours.   Studies/Results CT Head Wo Contrast  Result Date: 09/15/2021 CLINICAL DATA:  Altered mental status. EXAM: CT HEAD WITHOUT CONTRAST TECHNIQUE: Contiguous axial images were obtained from the base of the skull through the vertex without intravenous contrast. RADIATION DOSE REDUCTION: This exam was performed according to the departmental dose-optimization program which includes automated exposure control, adjustment of the mA and/or kV according to patient size and/or use of iterative reconstruction technique. COMPARISON:  February 11, 2007 FINDINGS: Brain: There is mild to moderate severity cerebral atrophy with widening of the extra-axial spaces and ventricular dilatation. There are areas of decreased attenuation within the white matter tracts of the supratentorial brain, consistent with microvascular disease changes. Vascular: No hyperdense vessel or unexpected calcification. Skull: Negative for an acute fracture. A chronic fracture of the left zygomatic arch is seen. Sinuses/Orbits: No acute finding. Other: None. IMPRESSION: Generalized cerebral  atrophy without evidence of an acute intracranial abnormality. Electronically Signed   By: Virgina Norfolk M.D.   On: 09/18/2021 20:29   CT Angio Chest PE W and/or Wo Contrast  Result Date: 09/01/2021 CLINICAL DATA:  PE suspected,  shortness of breath, confusion, history of esophageal cancer EXAM: CT ANGIOGRAPHY CHEST WITH CONTRAST TECHNIQUE: Multidetector CT imaging of the chest was performed using the standard protocol during bolus administration of intravenous contrast. Multiplanar CT image reconstructions and MIPs were obtained to evaluate the vascular anatomy. RADIATION DOSE REDUCTION: This exam was performed according to the departmental dose-optimization program which includes automated exposure control, adjustment of the mA and/or kV according to patient size and/or use of iterative reconstruction technique. CONTRAST:  92m OMNIPAQUE IOHEXOL 350 MG/ML SOLN COMPARISON:  10/20/2014 FINDINGS: Cardiovascular: Satisfactory opacification of the pulmonary arteries to the segmental level. No evidence of pulmonary embolism. Normal heart size. Extensive three-vessel coronary artery calcifications and/or stents. No pericardial effusion. Aortic atherosclerosis Mediastinum/Nodes: No enlarged mediastinal, hilar, or axillary lymph nodes. Status post pull-through esophagectomy. Thyroid gland and trachea demonstrate no significant findings. Lungs/Pleura: Mild, diffuse bilateral bronchial wall thickening. Bandlike scarring of the bilateral lung bases. No pleural effusion or pneumothorax. Upper Abdomen: No acute abnormality. Musculoskeletal: No chest wall abnormality. No acute osseous findings. Review of the MIP images confirms the above findings. IMPRESSION: 1. Negative examination for pulmonary embolism. 2. Mild, diffuse bilateral bronchial wall thickening, consistent with nonspecific infectious or inflammatory bronchitis. 3. Status post pull-through esophagectomy. No evidence of malignant recurrence in the chest. 4. Coronary artery disease. Aortic Atherosclerosis (ICD10-I70.0). Electronically Signed   By: ADelanna AhmadiM.D.   On: 08/27/2021 20:34   DG Chest Port 1 View  Result Date: 09/11/2021 CLINICAL DATA:  shortness of breath EXAM: PORTABLE  CHEST 1 VIEW COMPARISON:  March 05, 2021 FINDINGS: The cardiomediastinal silhouette is unchanged in contour.Atherosclerotic calcifications of the aorta. Unchanged blunting of the RIGHT costophrenic angle. No large pleural effusion. No pneumothorax. No acute pleuroparenchymal abnormality. Visualized abdomen is unremarkable. IMPRESSION: No acute cardiopulmonary abnormality. Electronically Signed   By: SValentino SaxonM.D.   On: 09/04/2021 16:25   ECHOCARDIOGRAM COMPLETE  Result Date: 08/29/2021    ECHOCARDIOGRAM REPORT   Patient Name:   Marquez T SMadagascarDate of Exam: 08/29/2021 Medical Rec #:  [id number]    Height:       66.0 in Accession #:    [id number]   Weight:       110.0 lb Date of Birth:  4Feb 20, 1962    BSA:          1.551 m Patient Age:    89years      BP:           153/95 mmHg Patient Gender: M             HR:           100 bpm. [facility]am [facility]  [facility]cedure: 2D Echo, Cardiac Doppler and Color Doppler Indications:    Atrial Fibrillation  History:        Patient has no prior history of Echocardiogram examinations.                 CAD, PAD, Arrythmias:Atrial Fibrillation; Risk                 Factors:Hypertension, Dyslipidemia and Current Smoker. Alcohol                 abuse.  Sonographer:  Wenda Low Referring Phys: [id number] Leanne Chang [facility]  Sonographer Comments: Technically difficult study due to poor echo windows. Image acquisition challenging due to patient body habitus. IMPRESSIONS  1. Patient in NSR during exam . Left ventricular ejection fraction, by estimation, is 60 to 65%. The left ventricle has normal function. The left ventricle has no regional wall motion abnormalities. There is mild left ventricular hypertrophy. Left ventricular diastolic parameters are consistent with Grade I diastolic dysfunction (impaired relaxation).  2. Right ventricular systolic function is normal. The right ventricular size is normal. There is severely elevated pulmonary artery systolic pressure.  3.  The mitral valve is abnormal. Trivial mitral valve regurgitation. No evidence of mitral stenosis.  4. The aortic valve is tricuspid. There is mild calcification of the aortic valve. There is mild thickening of the aortic valve. Aortic valve regurgitation is not visualized. Aortic valve sclerosis is present, with no evidence of aortic valve stenosis.  5. The inferior vena cava is normal in size with greater than 50% respiratory variability, suggesting right atrial pressure of 3 mmHg. FINDINGS  Left Ventricle: Patient in NSR during exam. Left ventricular ejection fraction, by estimation, is 60 to 65%. The left ventricle has normal function. The left ventricle has no regional wall motion abnormalities. The left ventricular internal cavity size was normal in size. There is mild left ventricular hypertrophy. Left ventricular diastolic parameters are consistent with Grade I diastolic dysfunction (impaired relaxation). Right Ventricle: The right ventricular size is normal. No increase in right ventricular wall thickness. Right ventricular systolic function is normal. There is severely elevated pulmonary artery systolic pressure. The tricuspid regurgitant velocity is 4.14 m/s, and with an assumed right atrial pressure of 8 mmHg, the estimated right ventricular systolic pressure is 42.3 mmHg. Left Atrium: Left atrial size was normal in size. Right Atrium: Right atrial size was normal in size. Pericardium: There is no evidence of pericardial effusion. Mitral Valve: The mitral valve is abnormal. There is mild thickening of the mitral valve leaflet(s). There is mild calcification of the mitral valve leaflet(s). Mild mitral annular calcification. Trivial mitral valve regurgitation. No evidence of mitral valve stenosis. MV peak gradient, 3.5 mmHg. The mean mitral valve gradient is 2.0 mmHg. Tricuspid Valve: The tricuspid valve is normal in structure. Tricuspid valve regurgitation is not demonstrated. No evidence of tricuspid  stenosis. Aortic Valve: The aortic valve is tricuspid. There is mild calcification of the aortic valve. There is mild thickening of the aortic valve. Aortic valve regurgitation is not visualized. Aortic valve sclerosis is present, with no evidence of aortic valve stenosis. Aortic valve mean gradient measures 1.0 mmHg. Aortic valve peak gradient measures 3.5 mmHg. Aortic valve area, by VTI measures 2.40 cm. Pulmonic Valve: The pulmonic valve was normal in structure. Pulmonic valve regurgitation is not visualized. No evidence of pulmonic stenosis. Aorta: The aortic root is normal in size and structure. Venous: The inferior vena cava is normal in size with greater than 50% respiratory variability, suggesting right atrial pressure of 3 mmHg. IAS/Shunts: The interatrial septum was not well visualized.  LEFT VENTRICLE PLAX 2D LVIDd:         3.40 cm   Diastology LVIDs:         2.30 cm   LV e' medial:    7.51 cm/s LV PW:         1.20 cm   LV E/e' medial:  8.8 LV IVS:        1.20 cm   LV e' lateral:  7.29 cm/s LVOT diam:     1.90 cm   LV E/e' lateral: 9.1 LV SV:         41 LV SV Index:   27 LVOT Area:     2.84 cm  RIGHT VENTRICLE RV Basal diam:  2.95 cm RV Mid diam:    3.00 cm RV S prime:     14.00 cm/s TAPSE (M-mode): 2.3 cm LEFT ATRIUM         Index       RIGHT ATRIUM           Index LA diam:    2.90 cm 1.87 cm/m  RA Area:     10.50 cm                                 RA Volume:   18.00 ml  11.60 ml/m  AORTIC VALVE                    PULMONIC VALVE AV Area (Vmax):    2.38 cm     PV Vmax:       0.77 m/s AV Area (Vmean):   2.55 cm     PV Peak grad:  2.4 mmHg AV Area (VTI):     2.40 cm AV Vmax:           94.00 cm/s AV Vmean:          53.100 cm/s AV VTI:            0.171 m AV Peak Grad:      3.5 mmHg AV Mean Grad:      1.0 mmHg LVOT Vmax:         79.00 cm/s LVOT Vmean:        47.800 cm/s LVOT VTI:          0.145 m LVOT/AV VTI ratio: 0.85  AORTA Ao Root diam: 3.30 cm MITRAL VALVE               TRICUSPID VALVE MV Area  (PHT): 3.97 cm    TR Peak grad:   68.6 mmHg MV Area VTI:   1.83 cm    TR Vmax:        414.00 cm/s MV Peak grad:  3.5 mmHg MV Mean grad:  2.0 mmHg    SHUNTS MV Vmax:       0.93 m/s    Systemic VTI:  0.14 m MV Vmean:      62.7 cm/s   Systemic Diam: 1.90 cm MV Decel Time: 191 msec MV E velocity: 66.00 cm/s MV A velocity: 82.30 cm/s MV E/A ratio:  0.80 Jenkins Rouge MD Electronically signed by Jenkins Rouge MD Signature Date/Time: 08/29/2021/12:17:46 PM    Final     Assessment  61 y.o. male with a history of esophageal adenocarcinoma status post esophagectomy in 2016 with recurrent strictures status post multiple dilations, CAD, PAD, anxiety, COPD, HTN, HLD, chronic alcohol and tobacco use.  He presented to the ED from home due to confusion, weakness, immobility, and poor oral intake since his most recent EGD with dilation on 08/17/2021.  GI was consulted for persistent dysphagia and recent gastric ulcer with need to anticoagulate for A-fib.   Anemia: Hemoglobin 10.5 on arrival, has remained stable at 10.4 today.  His previous Hgb last September was 13.4-15.9.  He is currently on heparin for A-fib.  Since hemoglobin is stable we will continue  to monitor CBCs for worsening anemia, and signs of overt GI bleeding while on the heparin drip.  Dysphagia: He was reported to have coughing and choking episodes with oral intake since his last esophageal dilation.  Family and patient have reported inability to eat, mostly food, some liquids, but were able to take pills.  No EGD today given patient electrolyte abnormalities.  There is concern for malnutrition, plan of care is involved and spoke with patient's wife and other family members who stated that they would be okay with a feeding tube if needed.  They report that he has had 1 in the past.  They reported goal for the patient is to receive adequate nutrition, and get better, to live a longer life.  Pending patient ability to have a EGD performed, PEG tube likely needed  for nutrition as he has had no input for several days.  Esophageal stricture and gastric ulcer: Had an esophagectomy in 2016 and has had multiple recurrent strictures with dilations.  Most recent EGD with dilation was performed on 08/17/2021 by Dr. Jenetta Downer, which revealed severe stenosis that was dilated to 11 mm, and injected with Kenalog.  A nonbleeding gastric ulcer was found as well, and was recommended repeat EGD in 6 weeks.  Patient has had poor p.o. intake since this procedure.  Upon speaking with Dr. Jenetta Downer, considering electrolyte abnormalities at this time, will do EGD once his electrolytes improve, will reassess daily.   Plan / Recommendations  Pantoprazole IV 40 mg BID Monitor for Anemia and overt signs of bleeding. Continue electrolyte replacements according to lab values, ideally like potassium , magnesium, and phosphorus more normalized. Continue to monitor H/H Consider EGD with possible dilation once electrolytes improve Consider PEG tube for nutrition if unable to stabilize for EGD    LOS: 2 days    09/01/2021, 9:05 AM   Venetia Night, MSN, FNP-BC, AGACNP-BC [facility]

## 2021-08-31 DIAGNOSIS — I480 Paroxysmal atrial fibrillation: Secondary | ICD-10-CM | POA: Diagnosis not present

## 2021-08-31 DIAGNOSIS — R1310 Dysphagia, unspecified: Secondary | ICD-10-CM

## 2021-08-31 DIAGNOSIS — K222 Esophageal obstruction: Secondary | ICD-10-CM | POA: Diagnosis not present

## 2021-08-31 DIAGNOSIS — E43 Unspecified severe protein-calorie malnutrition: Secondary | ICD-10-CM | POA: Insufficient documentation

## 2021-08-31 DIAGNOSIS — Z7189 Other specified counseling: Secondary | ICD-10-CM | POA: Diagnosis not present

## 2021-08-31 DIAGNOSIS — R627 Adult failure to thrive: Secondary | ICD-10-CM | POA: Diagnosis not present

## 2021-08-31 DIAGNOSIS — I4891 Unspecified atrial fibrillation: Secondary | ICD-10-CM | POA: Diagnosis not present

## 2021-08-31 LAB — CBC
HCT: 23.4 % — ABNORMAL LOW (ref 39.0–52.0)
Hemoglobin: 8.4 g/dL — ABNORMAL LOW (ref 13.0–17.0)
MCH: 32.4 pg (ref 26.0–34.0)
MCHC: 35.9 g/dL (ref 30.0–36.0)
MCV: 90.3 fL (ref 80.0–100.0)
Platelets: 121 10*3/uL — ABNORMAL LOW (ref 150–400)
RBC: 2.59 MIL/uL — ABNORMAL LOW (ref 4.22–5.81)
RDW: 12.5 % (ref 11.5–15.5)
WBC: 7.6 10*3/uL (ref 4.0–10.5)
nRBC: 0.3 % — ABNORMAL HIGH (ref 0.0–0.2)

## 2021-08-31 LAB — VITAMIN B12: Vitamin B-12: 766 pg/mL (ref 180–914)

## 2021-08-31 LAB — HEPARIN LEVEL (UNFRACTIONATED)
Heparin Unfractionated: 0.4 IU/mL (ref 0.30–0.70)
Heparin Unfractionated: 0.25 IU/mL — ABNORMAL LOW (ref 0.30–0.70)
Heparin Unfractionated: 0.42 IU/mL (ref 0.30–0.70)

## 2021-08-31 LAB — VITAMIN D 25 HYDROXY (VIT D DEFICIENCY, FRACTURES): Vit D, 25-Hydroxy: 72.06 ng/mL (ref 30–100)

## 2021-08-31 LAB — FOLATE: Folate: 2.6 ng/mL — ABNORMAL LOW (ref >5.9)

## 2021-08-31 MED ORDER — LORAZEPAM 2 MG/ML IJ SOLN: 1.0000 mg | INTRAMUSCULAR | Status: DC | PRN

## 2021-08-31 NOTE — Progress Notes (Signed)
? ?  Request made per Dr Denton Brick for percutaneous gastric tube placement ? ?Pt with esophageal cancer with previous esophagectomy .  Leaks and strictures with refractory dilitations. ? ?Wt loss; dysphagia; malnutrition; smoker ? ?Dr Pascal Lux has reviewed imaging and unfortunately this pt is NOT a candidate for percutaneous gastric tube placement secondary surgeries and anatomy at this point. ? ?Recommendation:  Surgical J tube placement ? ?Dr Denton Brick aware ?

## 2021-08-31 NOTE — Progress Notes (Signed)
Subjective: Denies any abdominal pain today. Denies nausea or vomiting. No BM today, last BM was before admission or maybe the day of. Patient reports he was having no issues with dysphagia of foods or liquids prior to admission, had decreased appetite after a fall 2 weeks ago. Tells me he is hungry and would like to try to eat today.  Objective: Vital signs in last 24 hours: Temp:  [97.5 F (36.4 C)-98.4 F (36.9 C)] 97.8 F (36.6 C) (03/08 0738) Pulse Rate:  [53-90] 66 (03/08 0620) Resp:  [15-36] 16 (03/08 0620) BP: (98-167)/(57-86) 138/62 (03/08 0620) SpO2:  [79 %-100 %] 99 % (03/08 0620) Weight:  [46.1 kg] 46.1 kg (03/08 0500) Last BM Date :  (PTA) General:   Alert and oriented, pleasant. cachectic Head:  Normocephalic and atraumatic. Eyes:  No icterus, sclera clear. Conjuctiva pink.  Mouth:  Without lesions, mucosa pink and moist.  Heart:  S1, S2 present, no murmurs noted.  Lungs: Clear to auscultation bilaterally, without wheezing, rales, or rhonchi.  Abdomen:  Bowel sounds present, soft, non-tender, non-distended. No HSM or hernias noted. No rebound or guarding. No masses appreciated  Msk:  Symmetrical without gross deformities. Normal posture. Pulses:  Normal pulses noted. Extremities:  Without clubbing or edema. Neurologic:  Alert and  oriented x4;  grossly normal neurologically. Skin:  Warm and dry, intact without significant lesions.  Psych:  Alert and cooperative. Normal mood and affect.  Intake/Output from previous day: 03/07 0701 - 03/08 0700 In: 1254.7 [I.V.:1254.7] Out: 300 [Urine:300]   Lab Results: Recent Labs    08/29/21 1002 09/12/2021 0427 08/31/21 0608  WBC 5.2 4.9 7.6  HGB 10.6* 10.4* 8.4*  HCT 29.3* 29.6* 23.4*  PLT 155 160 121*   BMET Recent Labs    09/03/2021 1615 08/29/21 1002 09/06/2021 0427  NA 124* 127* 126*  K 3.0* 3.1* 3.6  CL 84* 93* 91*  CO2 23 21* 22  GLUCOSE 116* 156* 120*  BUN 10 6 <5*  CREATININE 0.64 0.57* 0.66  CALCIUM 9.8  9.1 9.1   LFT Recent Labs    09/07/2021 1615  PROT 7.9  ALBUMIN 3.5  AST 23  ALT 14  ALKPHOS 57  BILITOT 3.0*   Studies/Results: ECHOCARDIOGRAM COMPLETE  Result Date: 08/29/2021    ECHOCARDIOGRAM REPORT   Patient Name:   Moksh T Belarus Date of Exam: 08/29/2021 Medical Rec #:  [id number]     Height:       66.0 in Accession #:    [id number]    Weight:       110.0 lb Date of Birth:  1961/03/21     BSA:          1.551 m Patient Age:    60 years      BP:           153/95 mmHg Patient Gender: M             HR:           100 bpm. [facility] Procedure: 2D Echo, Cardiac Doppler and Color Doppler Indications:    Atrial Fibrillation  History:        Patient has no prior history of Echocardiogram examinations.                 CAD, PAD, Arrythmias:Atrial Fibrillation; Risk                 Factors:Hypertension, Dyslipidemia and Current Smoker. Alcohol  abuse.  Sonographer:    Mikki Harbor Referring Phys: [phone] [facility] [place]  Sonographer Comments: Technically difficult study due to poor echo windows. Image acquisition challenging due to patient body habitus. IMPRESSIONS  1. Patient in NSR during exam . Left ventricular ejection fraction, by estimation, is 60 to 65%. The left ventricle has normal function. The left ventricle has no regional wall motion abnormalities. There is mild left ventricular hypertrophy. Left ventricular diastolic parameters are consistent with Grade I diastolic dysfunction (impaired relaxation).  2. Right ventricular systolic function is normal. The right ventricular size is normal. There is severely elevated pulmonary artery systolic pressure.  3. The mitral valve is abnormal. Trivial mitral valve regurgitation. No evidence of mitral stenosis.  4. The aortic valve is tricuspid. There is mild calcification of the aortic valve. There is mild thickening of the aortic valve. Aortic valve regurgitation is not visualized. Aortic valve sclerosis is present, with no  evidence of aortic valve stenosis.  5. The inferior vena cava is normal in size with greater than 50% respiratory variability, suggesting right atrial pressure of 3 mmHg. FINDINGS  Left Ventricle: Patient in NSR during exam. Left ventricular ejection fraction, by estimation, is 60 to 65%. The left ventricle has normal function. The left ventricle has no regional wall motion abnormalities. The left ventricular internal cavity size was normal in size. There is mild left ventricular hypertrophy. Left ventricular diastolic parameters are consistent with Grade I diastolic dysfunction (impaired relaxation). Right Ventricle: The right ventricular size is normal. No increase in right ventricular wall thickness. Right ventricular systolic function is normal. There is severely elevated pulmonary artery systolic pressure. The tricuspid regurgitant velocity is 4.14 m/s, and with an assumed right atrial pressure of 8 mmHg, the estimated right ventricular systolic pressure is 76.6 mmHg. Left Atrium: Left atrial size was normal in size. Right Atrium: Right atrial size was normal in size. Pericardium: There is no evidence of pericardial effusion. Mitral Valve: The mitral valve is abnormal. There is mild thickening of the mitral valve leaflet(s). There is mild calcification of the mitral valve leaflet(s). Mild mitral annular calcification. Trivial mitral valve regurgitation. No evidence of mitral valve stenosis. MV peak gradient, 3.5 mmHg. The mean mitral valve gradient is 2.0 mmHg. Tricuspid Valve: The tricuspid valve is normal in structure. Tricuspid valve regurgitation is not demonstrated. No evidence of tricuspid stenosis. Aortic Valve: The aortic valve is tricuspid. There is mild calcification of the aortic valve. There is mild thickening of the aortic valve. Aortic valve regurgitation is not visualized. Aortic valve sclerosis is present, with no evidence of aortic valve stenosis. Aortic valve mean gradient measures 1.0 mmHg.  Aortic valve peak gradient measures 3.5 mmHg. Aortic valve area, by VTI measures 2.40 cm. Pulmonic Valve: The pulmonic valve was normal in structure. Pulmonic valve regurgitation is not visualized. No evidence of pulmonic stenosis. Aorta: The aortic root is normal in size and structure. Venous: The inferior vena cava is normal in size with greater than 50% respiratory variability, suggesting right atrial pressure of 3 mmHg. IAS/Shunts: The interatrial septum was not well visualized.  LEFT VENTRICLE PLAX 2D LVIDd:         3.40 cm   Diastology LVIDs:         2.30 cm   LV e' medial:    7.51 cm/s LV PW:         1.20 cm   LV E/e' medial:  8.8 LV IVS:        1.20 cm  LV e' lateral:   7.29 cm/s LVOT diam:     1.90 cm   LV E/e' lateral: 9.1 LV SV:         41 LV SV Index:   27 LVOT Area:     2.84 cm  RIGHT VENTRICLE RV Basal diam:  2.95 cm RV Mid diam:    3.00 cm RV S prime:     14.00 cm/s TAPSE (M-mode): 2.3 cm LEFT ATRIUM         Index       RIGHT ATRIUM           Index LA diam:    2.90 cm 1.87 cm/m  RA Area:     10.50 cm                                 RA Volume:   18.00 ml  11.60 ml/m  AORTIC VALVE                    PULMONIC VALVE AV Area (Vmax):    2.38 cm     PV Vmax:       0.77 m/s AV Area (Vmean):   2.55 cm     PV Peak grad:  2.4 mmHg AV Area (VTI):     2.40 cm AV Vmax:           94.00 cm/s AV Vmean:          53.100 cm/s AV VTI:            0.171 m AV Peak Grad:      3.5 mmHg AV Mean Grad:      1.0 mmHg LVOT Vmax:         79.00 cm/s LVOT Vmean:        47.800 cm/s LVOT VTI:          0.145 m LVOT/AV VTI ratio: 0.85  AORTA Ao Root diam: 3.30 cm MITRAL VALVE               TRICUSPID VALVE MV Area (PHT): 3.97 cm    TR Peak grad:   68.6 mmHg MV Area VTI:   1.83 cm    TR Vmax:        414.00 cm/s MV Peak grad:  3.5 mmHg MV Mean grad:  2.0 mmHg    SHUNTS MV Vmax:       0.93 m/s    Systemic VTI:  0.14 m MV Vmean:      62.7 cm/s   Systemic Diam: 1.90 cm MV Decel Time: 191 msec MV E velocity: 66.00 cm/s MV A velocity:  82.30 cm/s MV E/A ratio:  0.80 Charlton Haws MD Electronically signed by Charlton Haws MD Signature Date/Time: 08/29/2021/12:17:46 PM    Final     Assessment: 61 year old male with a history of esophageal adenocarcinoma status post esophagectomy in 2016 with recurrent strictures status post multiple dilations, CAD, PAD, anxiety, COPD, HTN, HLD, chronic alcohol and tobacco use, who presented to the ED from home due to confusion, weakness, immobility, and poor oral intake since his most recent EGD with dilation on 08/17/2021.  GI was consulted for persistent dysphagia and recent gastric ulcer with need to anticoagulate for A-fib.  Anemia: hgb 10.5 on arrival, down to 8.4 today. Baseline appears to be 13-15 range. MCV 90.3, no overt GI bleeding.  Malnutrition/electrolyte imbalance r/t Dysphagia: subjective coughing and choking with PO  intake since last EGD in Feb 2023, reported by family, though patient reports he was coughing but denies dysphagia. Concern for malnutrition given poor PO intake tolerance, patient and family is amenable to PEG tube if needed. Sodium remains relatively the same at 126 (124), Potassium improved to 3.6 (3), Mag 1.3(1.8), phosphorus 1.5  Esophageal stricture and gastric ulcer: esophagectomy in 2016 and has had multiple recurrent strictures with dilations.  Most recent EGD with dilation was performed on 08/17/2021 by Dr. Levon Hedger, which revealed severe stenosis that was dilated to 11 mm, and injected with Kenalog.  A nonbleeding gastric ulcer was found as well, and was recommended repeat EGD in 6 weeks.  Patient has had poor p.o. intake since this procedure, telling me that he had no issues with dysphagia but had decreased appetite after a fall 2 weeks ago. He tells me today he is hungry and would like to try and eat.   Plan: Conintue PPI IV BID Monitor for overt GI bleeding Trend H&H Agree with PEG tube placement for nutrition Can advance to liquid diet EGD in the next 2-3 weeks  for possible repeat esophageal dilation   LOS: 3 days    08/31/2021, 9:59 AM   Jequan Shahin L. Jeanmarie Hubert, MSN, APRN, AGNP-C Adult-Gerontology Nurse Practitioner [facility]

## 2021-08-31 NOTE — Progress Notes (Signed)
? ?                                                                                                                                                     ?                                                   ?Daily Progress Note  ? ?Patient Name: Jimmy Tucker       Date: 08/31/2021 ?DOB: Aug 25, 1960  Age: 61 y.o. MRN#: [id number] ?Attending Physician: Roxan Hockey, MD ?Primary Care Physician: [name], MD ?Admit Date: 08/27/2021 ? ?Reason for Consultation/Follow-up: Establishing goals of care ? ?Patient Profile/HPI: 61 y.o. male  with past medical history of esophageal cancer s/p esophagectomy complicated by leaks and strictures, CAD, HTN, COPD, alcohol abuse- has had multiple dilatations, admitted on 08/31/2021 with confusion, poor oral intake, weakness, falls.  Work-up reveals A-fib with RVR-converted to sinus rhythm-plan to convert to p.o. meds when possible, failure to thrive-GI has been consulted-he is n.p.o.-electrolyte abnormalities due to poor oral intake.  Palliative medicine consulted for goals of care and advanced directive. ? ?Subjective:  ?Chart reviewed including progress notes and labs.  Noted that GI has deferred another EGD for now-recommend in the next 2 to 3 weeks for possible repeat esophageal dilation. ?Jimmy Tucker is a little more awake and alert today a little more oriented.  He and his wife are in agreement with proceeding with a PEG tube.  His primary goal of care is to obtain nutrition, hope to be stronger, and to be able to support his wife as she starts treatment for some health complications of her own. ?He is thirsty and hungry.  He notes he has not had anything to eat or drink since early Monday. ? ? ?Physical Exam ?Vitals and nursing note reviewed.  ?Constitutional:   ?   Comments: Cachectic  ?Pulmonary:  ?   Effort: Pulmonary effort is normal.  ?Neurological:  ?   Mental Status: He is oriented to person, place, and time.  ?         ? ?Vital Signs: BP (!) 112/51   Pulse (!) 57   Temp (!)  97.5 ?F (36.4 ?C) (Oral)   Resp 20   Ht '5\' 6"'$  (1.676 m)   Wt 46.1 kg   SpO2 100%   BMI 16.40 kg/m?  ?SpO2: SpO2: 100 % ?O2 Device: O2 Device: Room Air ?O2 Flow Rate:   ? ?Intake/output summary:  ?Intake/Output Summary (Last 24 hours) at 08/31/2021 1614 ?Last data filed at 08/31/2021 0600 ?Gross per 24 hour  ?Intake 1020.4 ml  ?Output 300 ml  ?Net 720.4 ml  ? ?LBM: Last BM Date :  (  PTA) ?Baseline Weight: Weight: 49.9 kg (per family) ?Most recent weight: Weight: 46.1 kg ? ?     ?Palliative Assessment/Data: PPS: 20% ? ? ? ? ? ?Patient Active Problem List  ? Diagnosis Date Noted  ? Protein-calorie malnutrition, severe 08/31/2021  ? Atrial fibrillation with rapid ventricular response ([place]) 09/01/2021  ? Failure to thrive in adult 08/26/2021  ? Electrolyte abnormality 09/07/2021  ? Fall at home, initial encounter 08/25/2021  ? Hypokalemia 03/06/2021  ? Hyperglycemia 03/06/2021  ? Elevated MCV 03/06/2021  ? Elevated AST (SGOT) 03/06/2021  ? Dehydration 03/06/2021  ? Esophageal stricture 03/05/2021  ? Chronic alcohol use 09/01/2020  ? Pressure injury of skin 08/03/2018  ? History of esophageal cancer 08/03/2018  ? Osteomyelitis of vertebra of lumbar region [facility]) 08/02/2018  ? Unsteady gait 08/02/2018  ? Osteomyelitis of lumbar spine ([place]) 08/02/2018  ? Coronary artery disease 01/25/2018  ? Unspecified atrial fibrillation ([place]) 12/24/2017  ? Hypothyroidism 12/24/2017  ? Thrombocytopenia ([place]) 12/24/2017  ? Alcohol abuse 12/24/2017  ? Hyponatremia 12/23/2017  ? Abnormal nuclear stress test 02/09/2015  ? PAD (peripheral artery disease) ([place]) 11/11/2013  ? Critical lower limb ischemia - poorly healing ulcer on left foot with occluded iliac, femoral artery on left 10/30/2013  ? Hypertension   ? Heavy cigarette smoker (20-39 per day)   ? Hyperlipidemia LDL goal <70   ? Screening for colon cancer 07/07/2011  ? ? ?Palliative Care Assessment & Plan  ? ? ?Assessment/Recommendations/Plan ? ?Failure to thrive in the setting of  esophageal strictures resulting from treatment for esophageal cancer-plan for PEG tube ?Goal of care is life-prolonging care so that he can be present and support his wife in her journey-we discussed he may need some rehab in order to help recover from his deconditioning so that he could be present for his spouse ?Secure chat sent to care team to request diet-he was previously n.p.o. for possible EGD but this has been postponed does not look like he is going to have any surgical procedures today ? ? ?Code Status: ?DNR ? ?Prognosis: ? Unable to determine ? ?Discharge Planning: ?To Be Determined ? ?Care plan was discussed with patient and care team ? ?Thank you for allowing the Palliative Medicine Team to assist in the care of this patient. ? ?   ?Greater than 50%  of this time was spent counseling and coordinating care related to the above assessment and plan. ? ?Mariana Kaufman, AGNP-C ?Palliative Medicine ? ? ?Please contact Palliative Medicine Team phone at [phone] for questions and concerns.  ? ? ? ? ? ? ?

## 2021-08-31 NOTE — Progress Notes (Signed)
ANTICOAGULATION CONSULT NOTE  ? ?Pharmacy Consult for Heparin ?Indication: atrial fibrillation ? ?Allergies  ?Allergen Reactions  ? Drug [Tape]   ?  PLEASE USE COBAN ,,,, ADHESIVE TAPE TEARS SKIN ?  ? Glycopyrrolate Other (See Comments)  ?  Intolerance--unknown reaction  ? Warfarin And Related Other (See Comments)  ?  Excessive bleeding  ? ? ?Patient Measurements: ?Height: '5\' 6"'$  (167.6 cm) ?Weight: 46.1 kg (101 lb 10.1 oz) ?IBW/kg (Calculated) : 63.8 ?HEPARIN DW (KG): 46.6  ? ?Vital Signs: ?Temp: 97.8 ?F (36.6 ?C) (03/08 2426) ?Temp Source: Axillary (03/08 8341) ?BP: 138/62 (03/08 9622) ?Pulse Rate: 66 (03/08 0620) ? ?Labs: ?Recent Labs  ?  08/29/2021 ?1615 09/08/2021 ?1652 09/18/2021 ?1804 08/29/21 ?0213 08/29/21 ?1002 08/29/21 ?1832 09/01/2021 ?0427 08/29/2021 ?1252 09/03/2021 ?2014 08/31/21 ?2979  ?HGB  --    < >  --    < > 10.6*  --  10.4*  --   --  8.4*  ?HCT  --    < >  --    < > 29.3*  --  29.6*  --   --  23.4*  ?PLT  --    < >  --    < > 155  --  160  --   --  121*  ?HEPARINUNFRC  --   --   --    < > 0.28*   < > 0.25* 0.18* 1.02* 0.40  ?CREATININE 0.64  --   --   --  0.57*  --  0.66  --   --   --   ?TROPONINIHS 7  --  11  --   --   --   --   --   --   --   ? < > = values in this interval not displayed.  ? ? ? ?Estimated Creatinine Clearance: 64 mL/min (by C-G formula based on SCr of 0.66 mg/dL). ? ? ?Medical History: ?Past Medical History:  ?Diagnosis Date  ? Abnormal nuclear stress test   ? Anxiety   ? Cancer [facility])   ? Carotid artery disease ([place])   ? Chronic back pain   ? Chronic lower back pain   ? "L4-5"  ? COPD (chronic obstructive pulmonary disease) ([place])   ? Esophageal cancer ([place])   ? Heavy cigarette smoker (20-39 per day)   ? Unmotivated  ? Hyperlipidemia LDL goal <70   ? Intolerant to statins  ? Hypertension   ? Lumbar disc disease   ? PAD (peripheral artery disease) ([place]) 10/30/2012  ? a) 2011 LEA Dopplers: Left ATA occlusion; b) 10/2013: LEA Dopplers - L Common Iliac & CFA occlusion - w/ short reconstitution at  the branch point of the external and internal iliac, the external iliac is occluded through the common femoral and proximal SFA; reconstitutes in the proximal SFA. Two-vessel runoff beyond.; c) s/p LCIA Stent & CFA EA w/ patch angioplasty --> f/u doppler 12/01/2013 patent  ? Pollen allergies   ? Presence of stent in artery   ? ? ?Assessment: ?Patient presents for altered mental status. Patient with new onset afib. Not on oral anticoagulants. Pharmacy asked to start heparin. Note that recent EGD showed non-bleeding gastric ulcer. ? ?HL 0.40- therapeutic ?Hgb 8.4 - monitor ? ?Goal of Therapy:  ?Heparin level 0.3-0.7 units/ml ?Monitor platelets by anticoagulation protocol: Yes ?  ?Plan:  ?Continue heparin infusion at 1000 units/hr ?Heparin level in 6 hours and daily. ?Continue to monitor H&H and platelets. ? ?Margot Ables, PharmD ?Clinical Pharmacist ?08/31/2021  7:52 AM ? ? ? ?

## 2021-08-31 NOTE — Progress Notes (Signed)
Initial Nutrition Assessment ? ?DOCUMENTATION CODES:  ? ?Severe malnutrition in context of chronic illness ? ?INTERVENTION:  ? ?If/when patient has tube placed: ? ?Recommend- Osmolite 1.2 @ 25 ml/hr via per tube and increase by 10 ml every 8 hours to goal rate of 60 ml/hr.  ? ?Tube feeding regimen provides 1728 kcal (100% of needs), 80 grams of protein, and 1181 ml of H2O.   ? ?Risk for refeeding due to inability to sustain nutrition requirements orally. Monitor magnesium, potassium, and phosphorus BID for at least 3 days, MD to replete as needed. ? ?Check B-12, vitamin C and vitamin D ? ?NUTRITION DIAGNOSIS:  ? ?Severe Malnutrition related to chronic illness (esophageal stricture, esophageal adenocarcinoma s/p esophagectomy in 2016. Recurrent strictures, dysphagia.) as evidenced by per patient/family report, severe muscle depletion, severe fat depletion, energy intake < 75% for > or equal to 1 month (BMI-16.4). ? ? ?GOAL:  ?Patient will meet greater than or equal to 90% of their needs ? ? ?MONITOR:  ?TF tolerance, Labs, Weight trends, I & O's ? ?REASON FOR ASSESSMENT:  ? ?Consult ?Assessment of nutrition requirement/status ? ?ASSESSMENT: Patient hx of esophageal stricture, esophageal adenocarcinoma s/p esophagectomy in 2016. Recurrent strictures, dysphagia, COPD, HTN, gastric ulcer and chronic ETOH and tobacco use.  ? ?Presented to ED on 3/5 with shortness of breath, altered mental status and poor oral intake.  ? ?Family bedside and patient reports little po intake x 2 weeks. Poor dentition. Unable to maintain adequate nutrition orally and surgery consulted to discuss PEG tube placement. Discussed patient with nursing. Tube feeding recommendations noted above.  ? ?Significant weight loss-4.7 kg/ 9% < 1 month. Current wt 46.1 kg. ? ?EGD and balloon dilation 08/17/21.  ? ?Palliative GOC note reviewed.  ? ?Medications: Thiamine 100 mg daily, Protonix 40 mg BID ? ?IVF-D5%/ 0.9%NaCl 1000 ml with KCL @ 50  ml/hr ? ? ?Intake/Output Summary (Last 24 hours) at 08/31/2021 1211 ?Last data filed at 08/31/2021 0600 ?Gross per 24 hour  ?Intake 1254.67 ml  ?Output 300 ml  ?Net 954.67 ml  ? + 2.3 liters since admission ? ?Labs: Sodium 126 (L), Phosphorus 1.5 (L), Magnesium (1.3 (L), HgB- 8.4 (L).  ?Folate 2.6 (L)-recommend 400 mcg/day to replenish. ? ?NUTRITION - FOCUSED PHYSICAL EXAM: ? ?Flowsheet Row Most Recent Value  ?Orbital Region Moderate depletion  ?Upper Arm Region Severe depletion  ?Thoracic and Lumbar Region Moderate depletion  ?Temple Region Moderate depletion  ?Clavicle Bone Region Severe depletion  ?Clavicle and Acromion Bone Region Severe depletion  ?Scapular Bone Region Severe depletion  ?Dorsal Hand Moderate depletion  ?Patellar Region Severe depletion  ?Anterior Thigh Region Severe depletion  ?Posterior Calf Region Severe depletion  ?Edema (RD Assessment) None  ?Hair Reviewed  ?Eyes Reviewed  ?Mouth Reviewed  Lennox Pippins poor dentition]  ?Skin Reviewed  ?Nails Reviewed  ? ?  ? ?Diet Order:   ?Diet Order   ? ?       ?  Diet NPO time specified  Diet effective now       ?  ? ?  ?  ? ?  ? ? ?EDUCATION NEEDS:  ?Education needs have been addressed ? ?Skin:  Skin Assessment: Reviewed RN Assessment ? ?Last BM:  unknown ? ?Height:  ? ?Ht Readings from Last 1 Encounters:  ?08/29/21 '5\' 6"'$  (1.676 m)  ? ? ?Weight:  ? ?Wt Readings from Last 1 Encounters:  ?08/31/21 46.1 kg  ? ? ?Ideal Body Weight:   65 kg ? ?BMI:  Body mass  index is 16.4 kg/m?. ? ?Estimated Nutritional Needs:  ? ?Kcal:  1600-1750 ? ?Protein:  82-88 gr ? ?Fluid:  >1200 ml daily ? ?Colman Cater MS,RD,CSG,LDN ?Contact: [facility]ION ?

## 2021-08-31 NOTE — Progress Notes (Signed)
?Progress Note ? ? ?Patient: Jimmy Tucker ZOX:[id number] DOB: 25-Dec-1960 DOA: 09/03/2021     3 ?DOS: the patient was seen and examined on 08/31/2021 ?  ?Brief hospital admission narrative course ?As per H&P written by Dr. Denton Brick on 09/22/2021 ?Jimmy Tucker is a 61 y.o. male with medical history significant for esophageal cancer, hypertension, coronary artery disease, atrial fibrillation, COPD, alcohol abuse. ?Patient was brought to the ED from home with reports of multiple complaints.  Family-spouse and daughter are present at bedside and assist with the history.  They report confusion today.  But over the past several days, since patient had his esophageal dilatation 2/22, he reports poor oral intake, has barely eaten, neurolysed weakness, patient unable to stand over the past few days, cannot walk.  He reports feeling dizzy a week ago, resulting in a fall twice that day.  Reports a feeling of panic attack today with transient difficulty breathing.  But denies specific palpitations.  He denies chest pain. ?  ?ED Course: Tachycardic, heart rate up to 165 on EKG.  Temperature 97.6.  Respiratory rate 17-32.  Blood pressure systolic initially 811/91.  Sodium 124.  Potassium 3.  Magnesium 1.8.  TSH 4.699.  Troponin 7 > 11 EKG shows atrial fibrillation with RVR.  He was given Cardizem with subsequent drop in blood pressure to 74/64.  1 L bolus was given.  Cardizem discontinued.  ED provider talked to cardiology, okay with amiodarone and heparin drip.  And cardiology team to see in the morning. ? ?Assessment and Plan: ?PAFib-- ?-He has a history of paroxysmal A-fib for which he was on anticoagulation with Xarelto per notes from 2020.   ?-Normal TSH ?-2D echo demonstrating grade 1 diastolic dysfunction; no wall motion abnormalities and preserved ejection fraction. ?-Developed hypotension on IV Cardizem ?-Following cardiology recommendations amiodarone drip and Lopressor has been used; patient has now cardiovert to sinus  rhythm. ?-Short-term and anticoagulation with heparin drip; patient is not a candidate for long-term anticoagulation.   ?--CHADsVASC score 2 ?- unable to tolerate oral intake so continue IV amiodarone ? ?Fall at home, initial encounter ?-B12, ammonia level and TSH within normal limits. ?-Family reporting difficulty sleeping and chronic use of pain medications and benzodiazepines as an outpatient. ?-More coherent, ?- PT eval once medically more stable ? ?Electrolyte abnormalities/-Patient with hyponatremia, hypokalemia, hypomagnesemia and hypophosphatemia ?-Continue to follow electrolytes trend and further replete them as needed ? ?Failure to thrive in adult-in the setting of esophageal stricture ?-Patient with significant quality decline and multiple falls at home in the setting of failure to thrive, deconditioning and poor balance. ?-Chest CTA negative for PE, suggest nonspecific infectious or inflammatory bronchitis.  He is afebrile and without leukocytosis. ?-CT head without acute intracranial abnormalities. ?-As per Dr. Domenica Fail was from IR not a candidate for IR placement of PEG tube ?-Awaiting decision from general surgeon on possible open J-tube/G-tube placement. ? ?Esophageal stricture ?-With failure to thrive.  Recurrent issue for patient,, has had over 50 EGDs and dilatation related to esophageal cancer for which he had surgery in the past.  ?--GI input appreciated ? ?Hypertension ?-Soft blood pressure at time of presentation in the setting of arrhythmia and Cardizem drip ?-Now stable. ?-Continue scheduled IV Lopressor as recommended by cardiology service and as needed IV hydralazine. ? ?Stage II pressure injury:  ?-Appreciated in left buttocks present at time of admission ?-Continue constant repositioning and barrier measures. ? ?Metabolic encephalopathy ?-In the setting of hospital-acquired delirium and most likely mild withdrawal from  chronic pain medications and benzos that he normally uses as an  outpatient. ?-Restarting as needed IV Dilaudid for chronic pain management ?-Patient with adverse reactions to the use of lorazepam ?-Planning to resume home Xanax when able to take by mouth. ? ?Subjective:  ?wife at bedside, ?-Patient is coherent and cooperative ?-Wondering when he can eat ? ?Physical Exam: ?Vitals:  ? 08/31/21 1200 08/31/21 1300 08/31/21 1400 08/31/21 1603  ?BP: (!) 103/48 (!) 108/47 (!) 112/51   ?Pulse: 62 64 (!) 57   ?Resp: 17 (!) 27 20   ?Temp:    (!) 97.5 ?F (36.4 ?C)  ?TempSrc:    Oral  ?SpO2: 100% 100% 100%   ?Weight:      ?Height:      ? ? ?Physical Exam ? ?Gen:- Awake Alert, in no acute distress, Frail and emaciated appearing ?HEENT:- [place].AT, No sclera icterus ?Neck-Supple Neck,No JVD,.  ?Lungs-  CTAB , fair air movement bilaterally  ?CV- S1, S2 normal, RRR ?Abd-  +ve B.Sounds, Abd Soft, No tenderness,    ?Extremity/Skin:- No  edema,   good pedal pulses  ?Psych-affect is appropriate, oriented x3 ?Neuro-generalized weakness no new focal deficits, no tremors ? ? ?Family Communication: Wife daughter at bedside. ? ?Disposition: Awaiting PEG/J tube placement ?Status is: Inpatient ? ?Remains inpatient appropriate because: Requiring IV amiodarone and IV heparin drip, pending PEG/J-tube placement and tolerance of nutrition ?-Unable to tolerate oral intake ? ? Planned Discharge Destination: Anticipate discharge possibly home with home --in 2 to 3 days ? ? ?Author: ?Roxan Hockey, MD ?08/31/2021 4:10 PM ? ?For on call review [email or web address].  ?

## 2021-08-31 NOTE — Progress Notes (Signed)
Progress Note  Patient Name: Jimmy Tucker Date of Encounter: 08/31/2021  Primary Cardiologist: [name], MD  Subjective   Patient more alert and responsive today, conversant.  No reported chest pain or palpitations.  Inpatient Medications    Scheduled Meds:  chlorhexidine  15 mL Mouth Rinse BID   Chlorhexidine Gluconate Cloth  6 each Topical Q0600   mouth rinse  15 mL Mouth Rinse q12n4p   metoprolol tartrate  2.5 mg Intravenous Q8H   pantoprazole (PROTONIX) IV  40 mg Intravenous Q12H   thiamine injection  100 mg Intravenous Daily   Continuous Infusions:  amiodarone 30 mg/hr (09/01/2021 2041)   dextrose 5 %-0.9% nacl with kcl 50 mL/hr at 08/29/2021 1749   heparin 1,000 Units/hr (09/01/2021 2221)   PRN Meds: acetaminophen **OR** acetaminophen, hydrALAZINE, HYDROmorphone (DILAUDID) injection, ipratropium-albuterol, ondansetron **OR** ondansetron (ZOFRAN) IV, polyethylene glycol   Vital Signs    Vitals:   08/31/21 0500 08/31/21 0600 08/31/21 0620 08/31/21 0738  BP: (!) 111/59 (!) 155/62 138/62   Pulse: 62  66   Resp: (!) 25 (!) 27 16   Temp:    97.8 F (36.6 C)  TempSrc:    Axillary  SpO2: 93%  99%   Weight: 46.1 kg     Height:        Intake/Output Summary (Last 24 hours) at 08/31/2021 0828 Last data filed at 08/31/2021 0600 Gross per 24 hour  Intake 1254.67 ml  Output 300 ml  Net 954.67 ml   Filed Weights   08/29/21 1229 09/17/2021 0500 08/31/21 0500  Weight: 46.6 kg 46.4 kg 46.1 kg    Telemetry    Sinus rhythm. Personally reviewed.  ECG    An ECG dated 09/10/2021 was personally reviewed today and demonstrated:  Sinus tachycardia, diffuse ST-T wave abnormality.  Physical Exam   GEN: No acute distress.   Neck: No JVD. Cardiac: RRR, no murmur, rub, or gallop.  Respiratory: Nonlabored. Clear to auscultation bilaterally. GI: Soft, nontender, bowel sounds present. MS: No edema; cachectic.  Labs    Chemistry Recent Labs  Lab 09/14/2021 1615 08/29/21 1002  09/13/2021 0427  NA 124* 127* 126*  K 3.0* 3.1* 3.6  CL 84* 93* 91*  CO2 23 21* 22  GLUCOSE 116* 156* 120*  BUN 10 6 <5*  CREATININE 0.64 0.57* 0.66  CALCIUM 9.8 9.1 9.1  PROT 7.9  --   --   ALBUMIN 3.5  --   --   AST 23  --   --   ALT 14  --   --   ALKPHOS 57  --   --   BILITOT 3.0*  --   --   GFRNONAA >60 >60 >60  ANIONGAP 17* 13 13     Hematology Recent Labs  Lab 08/29/21 1002 09/06/2021 0427 08/31/21 0608  WBC 5.2 4.9 7.6  RBC 3.09* 3.27* 2.59*  HGB 10.6* 10.4* 8.4*  HCT 29.3* 29.6* 23.4*  MCV 94.8 90.5 90.3  MCH 34.3* 31.8 32.4  MCHC 36.2* 35.1 35.9  RDW 12.6 12.4 12.5  PLT 155 160 121*    Cardiac Enzymes Recent Labs  Lab 09/01/2021 1615 09/08/2021 1804  TROPONINIHS 7 11    BNP Recent Labs  Lab 09/21/2021 1625  BNP 105.0*     DDimer Recent Labs  Lab 08/24/2021 1615  DDIMER 1.27*     Radiology    ECHOCARDIOGRAM COMPLETE  Result Date: 08/29/2021    ECHOCARDIOGRAM REPORT   Patient Name:   Jimmy NAWABI  Tucker Date of Exam: 08/29/2021 Medical Rec #:  [id number]     Height:       66.0 in Accession #:    [id number]    Weight:       110.0 lb Date of Birth:  1961/02/09     BSA:          1.551 m Patient Age:    61 years      BP:           153/95 mmHg Patient Gender: M             HR:           100 bpm. Exam Location:  [name] Procedure: 2D Echo, Cardiac Doppler and Color Doppler Indications:    Atrial Fibrillation  History:        Patient has no prior history of Echocardiogram examinations.                 CAD, PAD, Arrythmias:Atrial Fibrillation; Risk                 Factors:Hypertension, Dyslipidemia and Current Smoker. Alcohol                 abuse.  Sonographer:    Wenda Low Referring Phys: [phone] Leanne Chang [facility]  Sonographer Comments: Technically difficult study due to poor echo windows. Image acquisition challenging due to patient body habitus. IMPRESSIONS  1. Patient in NSR during exam . Left ventricular ejection fraction, by estimation, is 60 to 65%. The left  ventricle has normal function. The left ventricle has no regional wall motion abnormalities. There is mild left ventricular hypertrophy. Left ventricular diastolic parameters are consistent with Grade I diastolic dysfunction (impaired relaxation).  2. Right ventricular systolic function is normal. The right ventricular size is normal. There is severely elevated pulmonary artery systolic pressure.  3. The mitral valve is abnormal. Trivial mitral valve regurgitation. No evidence of mitral stenosis.  4. The aortic valve is tricuspid. There is mild calcification of the aortic valve. There is mild thickening of the aortic valve. Aortic valve regurgitation is not visualized. Aortic valve sclerosis is present, with no evidence of aortic valve stenosis.  5. The inferior vena cava is normal in size with greater than 50% respiratory variability, suggesting right atrial pressure of 3 mmHg. FINDINGS  Left Ventricle: Patient in NSR during exam. Left ventricular ejection fraction, by estimation, is 60 to 65%. The left ventricle has normal function. The left ventricle has no regional wall motion abnormalities. The left ventricular internal cavity size was normal in size. There is mild left ventricular hypertrophy. Left ventricular diastolic parameters are consistent with Grade I diastolic dysfunction (impaired relaxation). Right Ventricle: The right ventricular size is normal. No increase in right ventricular wall thickness. Right ventricular systolic function is normal. There is severely elevated pulmonary artery systolic pressure. The tricuspid regurgitant velocity is 4.14 m/s, and with an assumed right atrial pressure of 8 mmHg, the estimated right ventricular systolic pressure is 41.6 mmHg. Left Atrium: Left atrial size was normal in size. Right Atrium: Right atrial size was normal in size. Pericardium: There is no evidence of pericardial effusion. Mitral Valve: The mitral valve is abnormal. There is mild thickening of the  mitral valve leaflet(s). There is mild calcification of the mitral valve leaflet(s). Mild mitral annular calcification. Trivial mitral valve regurgitation. No evidence of mitral valve stenosis. MV peak gradient, 3.5 mmHg. The mean mitral valve gradient is 2.0 mmHg. Tricuspid Valve: The tricuspid  valve is normal in structure. Tricuspid valve regurgitation is not demonstrated. No evidence of tricuspid stenosis. Aortic Valve: The aortic valve is tricuspid. There is mild calcification of the aortic valve. There is mild thickening of the aortic valve. Aortic valve regurgitation is not visualized. Aortic valve sclerosis is present, with no evidence of aortic valve stenosis. Aortic valve mean gradient measures 1.0 mmHg. Aortic valve peak gradient measures 3.5 mmHg. Aortic valve area, by VTI measures 2.40 cm. Pulmonic Valve: The pulmonic valve was normal in structure. Pulmonic valve regurgitation is not visualized. No evidence of pulmonic stenosis. Aorta: The aortic root is normal in size and structure. Venous: The inferior vena cava is normal in size with greater than 50% respiratory variability, suggesting right atrial pressure of 3 mmHg. IAS/Shunts: The interatrial septum was not well visualized.  LEFT VENTRICLE PLAX 2D LVIDd:         3.40 cm   Diastology LVIDs:         2.30 cm   LV e' medial:    7.51 cm/s LV PW:         1.20 cm   LV E/e' medial:  8.8 LV IVS:        1.20 cm   LV e' lateral:   7.29 cm/s LVOT diam:     1.90 cm   LV E/e' lateral: 9.1 LV SV:         41 LV SV Index:   27 LVOT Area:     2.84 cm  RIGHT VENTRICLE RV Basal diam:  2.95 cm RV Mid diam:    3.00 cm RV S prime:     14.00 cm/s TAPSE (M-mode): 2.3 cm LEFT ATRIUM         Index       RIGHT ATRIUM           Index LA diam:    2.90 cm 1.87 cm/m  RA Area:     10.50 cm                                 RA Volume:   18.00 ml  11.60 ml/m  AORTIC VALVE                    PULMONIC VALVE AV Area (Vmax):    2.38 cm     PV Vmax:       0.77 m/s AV Area (Vmean):    2.55 cm     PV Peak grad:  2.4 mmHg AV Area (VTI):     2.40 cm AV Vmax:           94.00 cm/s AV Vmean:          53.100 cm/s AV VTI:            0.171 m AV Peak Grad:      3.5 mmHg AV Mean Grad:      1.0 mmHg LVOT Vmax:         79.00 cm/s LVOT Vmean:        47.800 cm/s LVOT VTI:          0.145 m LVOT/AV VTI ratio: 0.85  AORTA Ao Root diam: 3.30 cm MITRAL VALVE               TRICUSPID VALVE MV Area (PHT): 3.97 cm    TR Peak grad:   68.6 mmHg MV Area VTI:   1.83 cm  TR Vmax:        414.00 cm/s MV Peak grad:  3.5 mmHg MV Mean grad:  2.0 mmHg    SHUNTS MV Vmax:       0.93 m/s    Systemic VTI:  0.14 m MV Vmean:      62.7 cm/s   Systemic Diam: 1.90 cm MV Decel Time: 191 msec MV E velocity: 66.00 cm/s MV A velocity: 82.30 cm/s MV E/A ratio:  0.80 Jenkins Rouge MD Electronically signed by Jenkins Rouge MD Signature Date/Time: 08/29/2021/12:17:46 PM    Final      Assessment & Plan    1.  Paroxysmal atrial fibrillation, maintaining sinus rhythm on IV amiodarone and IV heparin at this point.  CHA2DS2-VASc score 1.  He remains NPO.  2.  History of esophageal adenocarcinoma status post esophagectomy in 2016 with subsequent recurrent strictures and multiple dilatations, persistent dysphagia.  PEG tube is being considered at this point given malnutrition and as a route for medication.  3.  History of moderate nonobstructive CAD and PAD.  I reviewed the chart.  Patient awaits consultation regarding PEG tube placement, was also seen by palliative care yesterday currently has a DNR status.  He remains on IV medications at this time.  No change in current regimen including IV amiodarone, IV heparin, and divided dose of Lopressor.  We can assist with conversion to pill regimen once PEG tube is in place and functioning. Given concerns about bleeding risk and falls, I would likely use aspirin rather than starting DOAC going forward, particularly with CHA2DS2-VASc score of 1.  We will continue to follow.  Discussed with Dr.  Denton Brick.  Signed, Rozann Lesches, MD  08/31/2021, 8:28 AM

## 2021-08-31 NOTE — Consult Note (Addendum)
Chart reviewed.  Asked to see patient for possible PEG placement.  Discussed with Dr. Jenetta Downer of GI who just recently performed esophageal dilatation due to a severe esophageal stricture.  As he was only able to be dilated to 11 mm, I will be unable to do a PEG.  This was discussed with Dr. Denton Brick.  Would suggest IR placement of gastrostomy tube.  The only thing I can offer is an open gastrostomy tube placement. ?

## 2021-08-31 NOTE — Progress Notes (Signed)
ANTICOAGULATION CONSULT NOTE  ? ?Pharmacy Consult for Heparin ?Indication: atrial fibrillation ? ?Allergies  ?Allergen Reactions  ? Drug [Tape]   ?  PLEASE USE COBAN ,,,, ADHESIVE TAPE TEARS SKIN ?  ? Glycopyrrolate Other (See Comments)  ?  Intolerance--unknown reaction  ? Warfarin And Related Other (See Comments)  ?  Excessive bleeding  ? ? ?Patient Measurements: ?Height: '5\' 6"'$  (167.6 cm) ?Weight: 46.1 kg (101 lb 10.1 oz) ?IBW/kg (Calculated) : 63.8 ?HEPARIN DW (KG): 46.6  ? ?Vital Signs: ?Temp: 97.4 ?F (36.3 ?C) (03/08 2000) ?Temp Source: Oral (03/08 2000) ?BP: 117/67 (03/08 1800) ?Pulse Rate: 57 (03/08 1800) ? ?Labs: ?Recent Labs  ?  08/29/21 ?1002 08/29/21 ?1832 09/04/2021 ?6553 09/10/2021 ?1252 08/31/21 ?7482 08/31/21 ?1205 08/31/21 ?2124  ?HGB 10.6*  --  10.4*  --  8.4*  --   --   ?HCT 29.3*  --  29.6*  --  23.4*  --   --   ?PLT 155  --  160  --  121*  --   --   ?HEPARINUNFRC 0.28*   < > 0.25*   < > 0.40 0.25* 0.42  ?CREATININE 0.57*  --  0.66  --   --   --   --   ? < > = values in this interval not displayed.  ? ? ? ?Estimated Creatinine Clearance: 64 mL/min (by C-G formula based on SCr of 0.66 mg/dL). ? ? ?Medical History: ?Past Medical History:  ?Diagnosis Date  ? Abnormal nuclear stress test   ? Anxiety   ? Cancer [facility])   ? Carotid artery disease ([place])   ? Chronic back pain   ? Chronic lower back pain   ? "L4-5"  ? COPD (chronic obstructive pulmonary disease) ([place])   ? Esophageal cancer ([place])   ? Heavy cigarette smoker (20-39 per day)   ? Unmotivated  ? Hyperlipidemia LDL goal <70   ? Intolerant to statins  ? Hypertension   ? Lumbar disc disease   ? PAD (peripheral artery disease) ([place]) 10/30/2012  ? a) 2011 LEA Dopplers: Left ATA occlusion; b) 10/2013: LEA Dopplers - L Common Iliac & CFA occlusion - w/ short reconstitution at the branch point of the external and internal iliac, the external iliac is occluded through the common femoral and proximal SFA; reconstitutes in the proximal SFA. Two-vessel runoff  beyond.; c) s/p LCIA Stent & CFA EA w/ patch angioplasty --> f/u doppler 12/01/2013 patent  ? Pollen allergies   ? Presence of stent in artery   ? ? ?Assessment: ?Patient presents for altered mental status. Patient with new onset afib. Not on oral anticoagulants. Pharmacy asked to start heparin. Note that recent EGD showed non-bleeding gastric ulcer. ? ?HL 0.42- therapeutic ?Hgb 8.4 - monitor ? ?Goal of Therapy:  ?Heparin level 0.3-0.7 units/ml ?Monitor platelets by anticoagulation protocol: Yes ?  ?Plan:  ?Continue heparin infusion at 1100 units/hr ?Heparin level in 6 hours and daily. ?Continue to monitor H&H and platelets. ? ? ? ? ? ?

## 2021-08-31 NOTE — Progress Notes (Signed)
ANTICOAGULATION CONSULT NOTE  ? ?Pharmacy Consult for Heparin ?Indication: atrial fibrillation ? ?Allergies  ?Allergen Reactions  ? Drug [Tape]   ?  PLEASE USE COBAN ,,,, ADHESIVE TAPE TEARS SKIN ?  ? Glycopyrrolate Other (See Comments)  ?  Intolerance--unknown reaction  ? Warfarin And Related Other (See Comments)  ?  Excessive bleeding  ? ? ?Patient Measurements: ?Height: '5\' 6"'$  (167.6 cm) ?Weight: 46.1 kg (101 lb 10.1 oz) ?IBW/kg (Calculated) : 63.8 ?HEPARIN DW (KG): 46.6  ? ?Vital Signs: ?Temp: 97.8 ?F (36.6 ?C) (03/08 1103) ?Temp Source: Oral (03/08 1103) ?BP: 108/47 (03/08 1300) ?Pulse Rate: 64 (03/08 1300) ? ?Labs: ?Recent Labs  ?  09/04/2021 ?1615 09/01/2021 ?1652 09/09/2021 ?1804 08/29/21 ?0213 08/29/21 ?1002 08/29/21 ?1832 09/18/2021 ?0427 09/09/2021 ?1252 09/09/2021 ?2014 08/31/21 ?1694 08/31/21 ?1205  ?HGB  --    < >  --    < > 10.6*  --  10.4*  --   --  8.4*  --   ?HCT  --    < >  --    < > 29.3*  --  29.6*  --   --  23.4*  --   ?PLT  --    < >  --    < > 155  --  160  --   --  121*  --   ?HEPARINUNFRC  --   --   --    < > 0.28*   < > 0.25*   < > 1.02* 0.40 0.25*  ?CREATININE 0.64  --   --   --  0.57*  --  0.66  --   --   --   --   ?TROPONINIHS 7  --  11  --   --   --   --   --   --   --   --   ? < > = values in this interval not displayed.  ? ? ? ?Estimated Creatinine Clearance: 64 mL/min (by C-G formula based on SCr of 0.66 mg/dL). ? ? ?Medical History: ?Past Medical History:  ?Diagnosis Date  ? Abnormal nuclear stress test   ? Anxiety   ? Cancer [facility])   ? Carotid artery disease ([place])   ? Chronic back pain   ? Chronic lower back pain   ? "L4-5"  ? COPD (chronic obstructive pulmonary disease) ([place])   ? Esophageal cancer ([place])   ? Heavy cigarette smoker (20-39 per day)   ? Unmotivated  ? Hyperlipidemia LDL goal <70   ? Intolerant to statins  ? Hypertension   ? Lumbar disc disease   ? PAD (peripheral artery disease) ([place]) 10/30/2012  ? a) 2011 LEA Dopplers: Left ATA occlusion; b) 10/2013: LEA Dopplers - L Common Iliac &  CFA occlusion - w/ short reconstitution at the branch point of the external and internal iliac, the external iliac is occluded through the common femoral and proximal SFA; reconstitutes in the proximal SFA. Two-vessel runoff beyond.; c) s/p LCIA Stent & CFA EA w/ patch angioplasty --> f/u doppler 12/01/2013 patent  ? Pollen allergies   ? Presence of stent in artery   ? ? ?Assessment: ?Patient presents for altered mental status. Patient with new onset afib. Not on oral anticoagulants. Pharmacy asked to start heparin. Note that recent EGD showed non-bleeding gastric ulcer. ? ?HL 0.25- subtherapeutic ?Hgb 8.4 - monitor ? ?Goal of Therapy:  ?Heparin level 0.3-0.7 units/ml ?Monitor platelets by anticoagulation protocol: Yes ?  ?Plan:  ?Increase heparin infusion to 1100  units/hr ?Heparin level in 6 hours and daily. ?Continue to monitor H&H and platelets. ? ?Margot Ables, PharmD ?Clinical Pharmacist ?08/31/2021 2:16 PM ? ? ? ?

## 2021-08-31 NOTE — Plan of Care (Signed)

## 2021-09-01 ENCOUNTER — Other Ambulatory Visit (INDEPENDENT_AMBULATORY_CARE_PROVIDER_SITE_OTHER): Payer: Self-pay

## 2021-09-01 ENCOUNTER — Encounter (HOSPITAL_COMMUNITY): Payer: Self-pay | Admitting: Internal Medicine

## 2021-09-01 DIAGNOSIS — I4891 Unspecified atrial fibrillation: Secondary | ICD-10-CM | POA: Diagnosis not present

## 2021-09-01 DIAGNOSIS — R531 Weakness: Secondary | ICD-10-CM

## 2021-09-01 DIAGNOSIS — K222 Esophageal obstruction: Secondary | ICD-10-CM

## 2021-09-01 LAB — RENAL FUNCTION PANEL
Albumin: 2.4 g/dL — ABNORMAL LOW (ref 3.5–5.0)
Anion gap: 6 (ref 5–15)
BUN: 8 mg/dL (ref 6–20)
CO2: 24 mmol/L (ref 22–32)
Calcium: 8.4 mg/dL — ABNORMAL LOW (ref 8.9–10.3)
Chloride: 98 mmol/L (ref 98–111)
Creatinine, Ser: 0.67 mg/dL (ref 0.61–1.24)
GFR, Estimated: >60 mL/min (ref >60)
Glucose, Bld: 103 mg/dL — ABNORMAL HIGH (ref 70–99)
Phosphorus: 2.4 mg/dL — ABNORMAL LOW (ref 2.5–4.6)
Potassium: 3.5 mmol/L (ref 3.5–5.1)
Sodium: 128 mmol/L — ABNORMAL LOW (ref 135–145)

## 2021-09-01 LAB — CBC
HCT: 22.9 % — ABNORMAL LOW (ref 39.0–52.0)
Hemoglobin: 8.1 g/dL — ABNORMAL LOW (ref 13.0–17.0)
MCH: 33.8 pg (ref 26.0–34.0)
MCHC: 35.4 g/dL (ref 30.0–36.0)
MCV: 95.4 fL (ref 80.0–100.0)
Platelets: 114 10*3/uL — ABNORMAL LOW (ref 150–400)
RBC: 2.4 MIL/uL — ABNORMAL LOW (ref 4.22–5.81)
RDW: 12.8 % (ref 11.5–15.5)
WBC: 5.4 10*3/uL (ref 4.0–10.5)
nRBC: 0 % (ref 0.0–0.2)

## 2021-09-01 LAB — HEPARIN LEVEL (UNFRACTIONATED)
Heparin Unfractionated: 0.47 IU/mL (ref 0.30–0.70)
Heparin Unfractionated: 0.47 IU/mL (ref 0.30–0.70)

## 2021-09-01 MED ORDER — CEFAZOLIN SODIUM-DEXTROSE 2-4 GM/100ML-% IV SOLN
2.0000 g | INTRAVENOUS | Status: AC
  Administered 2021-09-02: 2 g via INTRAVENOUS

## 2021-09-01 MED ORDER — HEPARIN (PORCINE) 25000 UT/250ML-% IV SOLN: 1100.0000 [IU]/h | INTRAVENOUS | Status: AC

## 2021-09-01 MED ORDER — ALPRAZOLAM 0.5 MG PO TABS
0.5000 mg | ORAL_TABLET | Freq: Two times a day (BID) | ORAL | Status: DC | PRN
  Administered 2021-09-01 – 2021-09-03 (×5): 0.5 mg via ORAL
  Filled 2021-09-01 (×5): qty 1

## 2021-09-01 MED ORDER — HEPARIN (PORCINE) 25000 UT/250ML-% IV SOLN: 1100.0000 [IU]/h | INTRAVENOUS | Status: DC

## 2021-09-01 MED ORDER — CHLORHEXIDINE GLUCONATE CLOTH 2 % EX PADS
6.0000 | MEDICATED_PAD | Freq: Once | CUTANEOUS | Status: AC
  Administered 2021-09-01: 23:00:00 6 via TOPICAL

## 2021-09-01 MED ORDER — CHLORHEXIDINE GLUCONATE CLOTH 2 % EX PADS: 6.0000 | MEDICATED_PAD | Freq: Once | CUTANEOUS | Status: AC

## 2021-09-01 MED ORDER — BOOST / RESOURCE BREEZE PO LIQD CUSTOM
1.0000 | Freq: Three times a day (TID) | ORAL | Status: DC
  Administered 2021-09-01 – 2021-09-03 (×2): 1 via ORAL

## 2021-09-01 NOTE — Progress Notes (Signed)
ANTICOAGULATION CONSULT NOTE  ? ?Pharmacy Consult for Heparin ?Indication: atrial fibrillation ? ?Allergies  ?Allergen Reactions  ? Drug [Tape]   ?  PLEASE USE COBAN ,,,, ADHESIVE TAPE TEARS SKIN ?  ? Glycopyrrolate Other (See Comments)  ?  Intolerance--unknown reaction  ? Warfarin And Related Other (See Comments)  ?  Excessive bleeding  ? ? ?Patient Measurements: ?Height: '5\' 6"'$  (167.6 cm) ?Weight: 48.9 kg (107 lb 12.9 oz) ?IBW/kg (Calculated) : 63.8 ?HEPARIN DW (KG): 46.6  ? ?Vital Signs: ?Temp: 97.8 ?F (36.6 ?C) (03/09 0300) ?Temp Source: Oral (03/09 0300) ?BP: 107/64 (03/09 0600) ?Pulse Rate: 57 (03/09 0600) ? ?Labs: ?Recent Labs  ?  08/29/21 ?1002 08/29/21 ?0174 09/08/2021 ?9449 09/18/2021 ?1252 08/31/21 ?6759 08/31/21 ?1205 08/31/21 ?2124 09/01/21 ?1638  ?HGB 10.6*  --  10.4*  --  8.4*  --   --  8.1*  ?HCT 29.3*  --  29.6*  --  23.4*  --   --  22.9*  ?PLT 155  --  160  --  121*  --   --  114*  ?HEPARINUNFRC 0.28*   < > 0.25*   < > 0.40 0.25* 0.42 0.47  ?CREATININE 0.57*  --  0.66  --   --   --   --  0.67  ? < > = values in this interval not displayed.  ? ? ? ?Estimated Creatinine Clearance: 67.9 mL/min (by C-G formula based on SCr of 0.67 mg/dL). ? ? ?Medical History: ?Past Medical History:  ?Diagnosis Date  ? Abnormal nuclear stress test   ? Anxiety   ? Cancer [facility])   ? Carotid artery disease ([place])   ? Chronic back pain   ? Chronic lower back pain   ? "L4-5"  ? COPD (chronic obstructive pulmonary disease) ([place])   ? Esophageal cancer ([place])   ? Heavy cigarette smoker (20-39 per day)   ? Unmotivated  ? Hyperlipidemia LDL goal <70   ? Intolerant to statins  ? Hypertension   ? Lumbar disc disease   ? PAD (peripheral artery disease) ([place]) 10/30/2012  ? a) 2011 LEA Dopplers: Left ATA occlusion; b) 10/2013: LEA Dopplers - L Common Iliac & CFA occlusion - w/ short reconstitution at the branch point of the external and internal iliac, the external iliac is occluded through the common femoral and proximal SFA; reconstitutes in  the proximal SFA. Two-vessel runoff beyond.; c) s/p LCIA Stent & CFA EA w/ patch angioplasty --> f/u doppler 12/01/2013 patent  ? Pollen allergies   ? Presence of stent in artery   ? ? ?Assessment: ?Patient presents for altered mental status. Patient with new onset afib. Not on oral anticoagulants. Pharmacy asked to start heparin. Note that recent EGD showed non-bleeding gastric ulcer. ? ?HL 0.47- therapeutic ?Hgb 8.1 - monitor ? ?Goal of Therapy:  ?Heparin level 0.3-0.7 units/ml ?Monitor platelets by anticoagulation protocol: Yes ?  ?Plan:  ?Continue heparin infusion at 1100 units/hr ?Heparin level daily. ?Continue to monitor H&H and platelets. ? ? ? ? ? ?

## 2021-09-01 NOTE — Progress Notes (Signed)
? ?                                                                                                                                                     ?                                                   ?Daily Progress Note  ? ?Patient Name: Jimmy Tucker       Date: 09/01/2021 ?DOB: [birth year]  Age: 61 y.o. MRN#: [id number] ?Attending Physician: Jimmy Hockey, MD ?Primary Care Physician: Jimmy School, MD ?Admit Date: 09/18/2021 ? ?Reason for Consultation/Follow-up: Establishing goals of care ? ?Patient Profile/HPI: 61 y.o. male  with past medical history of esophageal cancer s/p esophagectomy complicated by leaks and strictures, CAD, HTN, COPD, alcohol abuse- has had multiple dilatations, admitted on 08/26/2021 with confusion, poor oral intake, weakness, falls.  Work-up reveals A-fib with RVR-converted to sinus rhythm-plan to convert to p.o. meds when possible, failure to thrive-GI has been consulted-he is n.p.o.-electrolyte abnormalities due to poor oral intake.  Palliative medicine consulted for goals of care and advanced directive. ? ?Subjective: ?Labs and progress notes reviewed. Noted he is scheduled to have open surgical j-tube placement tomorrow.  ?[name] is awake, alert and oriented today. His spouse and daughters are at bedside.  ?We discussed his code status at his request. He would like to change his code status back to full code.  ?We discussed the meaning of CPR and likely outcomes given his state of health.  ?Encouraged him to consider remaining DNR status understanding evidenced based poor outcomes in similar hospitalized patients, as the cause of the arrest is likely associated with chronic/terminal disease rather than a reversible acute cardio-pulmonary event.  ?He expressed that he would not want to be prolonged on a ventilator as he was in the past (he was on a vent for six weeks in the past, with trach, but was able to be weaned and decannulated), however, he would still want to receive CPR  in the event that his heart stopped and he stopped breathing.  ?He and family had questions about his diet. He was brought jello for his clear liquid tray- but they were under the impression he could not have anything red. We discussed that since his procedure is surgical, and not endoscopy tomorrow- it is ok for him to have the red jello and cranberry juice cocktail on his tray.  ? ?Review of Systems  ?Constitutional:  Positive for malaise/fatigue.  ?Respiratory:  Negative for shortness of breath.   ? ? ?Physical Exam ?Vitals and nursing note reviewed.  ?Constitutional:   ?   Comments: Frail, cachetic  ?Neurological:  ?  Mental Status: He is oriented to person, place, and time.  ?         ? ?Vital Signs: BP (!) 147/75   Pulse 63   Temp 97.8 ?F (36.6 ?C) (Oral)   Resp 17   Ht '5\' 6"'$  (1.676 m)   Wt 48.9 kg   SpO2 100%   BMI 17.40 kg/m?  ?SpO2: SpO2: 100 % ?O2 Device: O2 Device: Room Air ?O2 Flow Rate: O2 Flow Rate (L/min): 2 L/min ? ?Intake/output summary:  ?Intake/Output Summary (Last 24 hours) at 09/01/2021 1421 ?Last data filed at 09/01/2021 5859 ?Gross per 24 hour  ?Intake 1950.44 ml  ?Output 800 ml  ?Net 1150.44 ml  ? ?LBM: Last BM Date :  (PTA) ?Baseline Weight: Weight: 49.9 kg (per family) ?Most recent weight: Weight: 48.9 kg ? ?     ?Palliative Assessment/Data: PPS: 20% ? ? ? ? ? ?Patient Active Problem List  ? Diagnosis Date Noted  ? Protein-calorie malnutrition, severe 08/31/2021  ? Atrial fibrillation with rapid ventricular response ([place]) 09/13/2021  ? Failure to thrive in adult 08/25/2021  ? Electrolyte abnormality 09/11/2021  ? Fall at home, initial encounter 08/24/2021  ? Hypokalemia 03/06/2021  ? Hyperglycemia 03/06/2021  ? Elevated MCV 03/06/2021  ? Elevated AST (SGOT) 03/06/2021  ? Dehydration 03/06/2021  ? Esophageal stricture 03/05/2021  ? Dysphagia 09/01/2020  ? Chronic alcohol use 09/01/2020  ? Pressure injury of skin 08/03/2018  ? History of esophageal cancer 08/03/2018  ? Osteomyelitis of  vertebra of lumbar region [facility]) 08/02/2018  ? Unsteady gait 08/02/2018  ? Osteomyelitis of lumbar spine ([place]) 08/02/2018  ? Coronary artery disease 01/25/2018  ? Unspecified atrial fibrillation ([place]) 12/24/2017  ? Hypothyroidism 12/24/2017  ? Thrombocytopenia ([place]) 12/24/2017  ? Alcohol abuse 12/24/2017  ? Hyponatremia 12/23/2017  ? Abnormal nuclear stress test 02/09/2015  ? PAD (peripheral artery disease) ([place]) 11/11/2013  ? Critical lower limb ischemia - poorly healing ulcer on left foot with occluded iliac, femoral artery on left 10/30/2013  ? Hypertension   ? Heavy cigarette smoker (20-39 per day)   ? Hyperlipidemia LDL goal <70   ? Screening for colon cancer 07/07/2011  ? ? ?Palliative Care Assessment & Plan  ? ? ?Assessment/Recommendations/Plan ? ?Code status changed back to full code ?Continue full scope treatment ? ? ?Code Status: ?Full code ? ?Prognosis: ? Unable to determine ? ?Discharge Planning: ?To Be Determined ? ?Care plan was discussed with patient and family,. ? ?Thank you for allowing the Palliative Medicine Team to assist in the care of this patient. ? ?Jimmy Tucker, AGNP-C ?Palliative Medicine ? ? ?Please contact Palliative Medicine Team phone at [phone] for questions and concerns.  ? ? ? ? ? ? ?

## 2021-09-01 NOTE — Progress Notes (Signed)
Nutrition Follow-up ? ?DOCUMENTATION CODES:  ? ?Severe malnutrition in context of chronic illness ? ?INTERVENTION:  ?- Boost Breeze po TID, each supplement provides 250 kcal and 9 grams of protein ? ?- Once PEJ tube placed recommend: ? ?Osmolite 1.2 @ 25 ml/hr via per tube and increase by 10 ml every 8 hours to goal rate of 60 ml/hr.  ?  ?Tube feeding regimen provides 1728 kcal (100% of needs), 80 grams of protein, and 1181 ml of H2O.   ?  ?Risk for refeeding due to inability to sustain nutrition requirements orally. Monitor magnesium, potassium, and phosphorus BID for at least 3 days, MD to replete as needed. ? ?NUTRITION DIAGNOSIS:  ? ?Severe Malnutrition related to chronic illness (esophageal stricture, esophageal adenocarcinoma s/p esophagectomy in 2016. Recurrent strictures, dysphagia.) as evidenced by per patient/family report, severe muscle depletion, severe fat depletion, energy intake < 75% for > or equal to 1 month (BMI-16.4). ? ?Ongoing ? ?GOAL:  ? ?Patient will meet greater than or equal to 90% of their needs ? ?Not being met- addressing needs with placement of PEJ-tube ? ?MONITOR:  ? ?TF tolerance, Labs, Weight trends, I & O's ? ?REASON FOR ASSESSMENT:  ? ?Consult ?Assessment of nutrition requirement/status ? ?ASSESSMENT:  ? ?Patient hx of esophageal stricture, esophageal adenocarcinoma s/p esophagectomy in 2016. Recurrent strictures, dysphagia, COPD, HTN, gastric ulcer and chronic ETOH and tobacco use. ? ?Per Surgery, Patient is not a candidate for gastrostomy tube placement due to the esophageal stricture and the intrathoracic placement of the stomach. May need to consider TPN if intra-abdominal adhesions present during placement of PEJ tube. Plans to place tube tomorrow. ? ?Pt now on clear liquid diet. He is agreeable to receive [employer] today with plans for initiation of tube feeding tomorrow. ? ?Spoke with RN and let her know we will place order for tube feeds to begin once tube is  placed. ? ?Medications: protonix, thiamine] ? ?Labs: sodium 128, phos 2.4, Mg 1.3 (replaced) ?Vitamin B12: 766 (WNL) ?Vitamin D: 72.06 (WNL) ?Vitamin C: pending ? ?Diet Order:   ?Diet Order   ? ?       ?  Diet NPO time specified  Diet effective midnight       ?  ?  Diet clear liquid Room service appropriate? Yes with Assist; Fluid consistency: Thin  Diet effective now       ?  ? ?  ?  ? ?  ? ? ?EDUCATION NEEDS:  ? ?Education needs have been addressed ? ?Skin:  Skin Assessment: Reviewed RN Assessment ? ?Last BM:  unknown ? ?Height:  ? ?Ht Readings from Last 1 Encounters:  ?08/29/21 _0  (1.676 m)  ? ? ?Weight:  ? ?Wt Readings from Last 1 Encounters:  ?09/01/21 48.9 kg  ? ? ?BMI:  Body mass index is 17.4 kg/m?. ? ?Estimated Nutritional Needs:  ? ?Kcal:  1600-1750 ? ?Protein:  82-88 gr ? ?Fluid:  >1200 ml daily ? ?Clayborne Dana, RDN, LDN ?Clinical Nutrition ?

## 2021-09-01 NOTE — Progress Notes (Signed)
? ?Progress Note ? ?Patient Name: Jimmy Tucker ?Date of Encounter: 09/01/2021 ? ?[facility] Cardiologist: [name], MD  ? ?Subjective  ? ?He denies any chest pain or palpitations. Respiratory status stable. On a clear liquid diet and being considered for J-tube placement as not a candidate for IR placement of PEG tube.  ? ?Inpatient Medications  ?  ?Scheduled Meds: ? chlorhexidine  15 mL Mouth Rinse BID  ? Chlorhexidine Gluconate Cloth  6 each Topical Q0600  ? mouth rinse  15 mL Mouth Rinse q12n4p  ? metoprolol tartrate  2.5 mg Intravenous Q8H  ? pantoprazole (PROTONIX) IV  40 mg Intravenous Q12H  ? thiamine injection  100 mg Intravenous Daily  ? ?Continuous Infusions: ? amiodarone 30 mg/hr (08/31/21 2131)  ? dextrose 5 %-0.9% nacl with kcl 50 mL/hr at 08/31/21 1501  ? heparin 1,100 Units/hr (08/31/21 2352)  ? ?PRN Meds: ?acetaminophen **OR** acetaminophen, hydrALAZINE, HYDROmorphone (DILAUDID) injection, ipratropium-albuterol, ondansetron **OR** ondansetron (ZOFRAN) IV, polyethylene glycol  ? ?Vital Signs  ?  ?Vitals:  ? 09/01/21 0400 09/01/21 0500 09/01/21 0600 09/01/21 0745  ?BP: (!) 108/55 126/61 107/64   ?Pulse: (!) 58 (!) 58 (!) 57   ?Resp: '13 13 16   '$ ?Temp:    97.6 ?F (36.4 ?C)  ?TempSrc:    Oral  ?SpO2: 98% 98% 98%   ?Weight:      ?Height:      ? ? ?Intake/Output Summary (Last 24 hours) at 09/01/2021 0902 ?Last data filed at 09/01/2021 2952 ?Gross per 24 hour  ?Intake 1950.44 ml  ?Output 800 ml  ?Net 1150.44 ml  ? ?Last 3 Weights 09/01/2021 08/31/2021 08/27/2021  ?Weight (lbs) 107 lb 12.9 oz 101 lb 10.1 oz 102 lb 4.7 oz  ?Weight (kg) 48.9 kg 46.1 kg 46.4 kg  ?   ? ?Telemetry  ?  ?Sinus bradycardia, HR in 50's to 60's.  - Personally Reviewed ? ?ECG  ?  ?No new tracings.  ? ?Physical Exam  ? ?GEN: Frail male appearing in no acute distress.   ?Neck: No JVD ?Cardiac: RRR, no murmurs, rubs, or gallops.  ?Respiratory: Clear to auscultation bilaterally without wheezing or rales. ?GI: Soft, nontender, non-distended   ?MS: No pitting edema; No deformity. ?Neuro:  Nonfocal  ?Psych: Normal affect  ? ?Labs  ?  ?High Sensitivity Troponin:   ?Recent Labs  ?Lab 09/22/2021 ?1615 09/11/2021 ?8413  ?TROPONINIHS 7 11  ?   ?Chemistry ?Recent Labs  ?Lab 09/19/2021 ?1615 08/29/21 ?1002 09/21/2021 ?0427 09/01/21 ?0314  ?NA 124* 127* 126* 128*  ?K 3.0* 3.1* 3.6 3.5  ?CL 84* 93* 91* 98  ?CO2 23 21* 22 24  ?GLUCOSE 116* 156* 120* 103*  ?BUN 10 6 <5* 8  ?CREATININE 0.64 0.57* 0.66 0.67  ?CALCIUM 9.8 9.1 9.1 8.4*  ?MG 1.8  --  1.3*  --   ?PROT 7.9  --   --   --   ?ALBUMIN 3.5  --   --  2.4*  ?AST 23  --   --   --   ?ALT 14  --   --   --   ?ALKPHOS 57  --   --   --   ?BILITOT 3.0*  --   --   --   ?GFRNONAA >60 >60 >60 >60  ?ANIONGAP 17* '13 13 6  '$ ?  ?Lipids No results for input(s): CHOL, TRIG, HDL, LABVLDL, LDLCALC, CHOLHDL in the last 168 hours.  ?Hematology ?Recent Labs  ?Lab 09/12/2021 ?2440 08/31/21 ?1027  09/01/21 ?0314  ?WBC 4.9 7.6 5.4  ?RBC 3.27* 2.59* 2.40*  ?HGB 10.4* 8.4* 8.1*  ?HCT 29.6* 23.4* 22.9*  ?MCV 90.5 90.3 95.4  ?MCH 31.8 32.4 33.8  ?MCHC 35.1 35.9 35.4  ?RDW 12.4 12.5 12.8  ?PLT 160 121* 114*  ? ?Thyroid  ?Recent Labs  ?Lab 09/09/2021 ?1803  ?TSH 4.699*  ?  ?BNP ?Recent Labs  ?Lab 08/26/2021 ?1625  ?BNP 105.0*  ?  ?DDimer  ?Recent Labs  ?Lab 08/27/2021 ?1615  ?DDIMER 1.27*  ?  ? ?Radiology  ?  ?No results found. ? ?Cardiac Studies  ? ?Echocardiogram: 08/29/2021 ?IMPRESSIONS  ? ? ? 1. Patient in NSR during exam . Left ventricular ejection fraction, by  ?estimation, is 60 to 65%. The left ventricle has normal function. The left  ?ventricle has no regional wall motion abnormalities. There is mild left  ?ventricular hypertrophy. Left  ?ventricular diastolic parameters are consistent with Grade I diastolic  ?dysfunction (impaired relaxation).  ? 2. Right ventricular systolic function is normal. The right ventricular  ?size is normal. There is severely elevated pulmonary artery systolic  ?pressure.  ? 3. The mitral valve is abnormal. Trivial mitral  valve regurgitation. No  ?evidence of mitral stenosis.  ? 4. The aortic valve is tricuspid. There is mild calcification of the  ?aortic valve. There is mild thickening of the aortic valve. Aortic valve  ?regurgitation is not visualized. Aortic valve sclerosis is present, with  ?no evidence of aortic valve stenosis.  ? 5. The inferior vena cava is normal in size with greater than 50%  ?respiratory variability, suggesting right atrial pressure of 3 mmHg.  ? ?Patient Profile  ?   ?61 y.o. male w/ PMH of moderate nonobstructive CAD by cath in 01/2015, paroxysmal atrial fibrillation (not on anticoagulation due to frequent falls), COPD, PAD (s/p left femoral endarterectomy), esophageal cancer with esophageal strictures (s/p esophagectomy in 2016) who Cardiology is following for atrial fibrillation with RVR.   ? ?Assessment & Plan  ?  ?1. Atrial Fibrillation with RVR ?- He did convert to NSR with IV Amiodarone this admission and remains on IV Amiodarone at this time as he is unable to take PO medications. Also remains on IV Lopressor 2.'5mg'$  Q8H and will plan to switch both to PO once access has been established.  ?- He remains on IV Heparin for anticoagulation at this time. His CHA2DS2-VASc Score is currently 1. As previously discussed, he is not an ideal candidate for anticoagulation given his frequent falls and multiple medical issues. At this time, the plan is to switch to ASA once taking PO medications.  ? ?2. CAD ?- He had nonobstructive disease by prior catheterization in 2016. Hs Troponin values have been negative at 7 and 11 this admission. Echo shows a preserved EF of 60-65% with no regional WMA. No plans for further cardiac testing this admission. Would anticipate restarting ASA once taking PO medications. He has been intolerant to statins.  ? ?3. Failure to Thrive ?- Not felt to be a candidate for IR gastric tube placement with J-tube placement recommended. Palliative Care following as well for goals for care  discussion.  ? ?4. Hyponatremia ?- Na+ was at 124 on admission, improving to 128 today. Receiving IV fluids.  ? ?For questions or updates, please contact [place] ?Please consult [email or web address] for contact info under  ? ?  ?   ?Signed, ?Erma Heritage, PA-C  ?09/01/2021, 9:02 AM   ? ?

## 2021-09-01 NOTE — Progress Notes (Addendum)
?Progress Note ? ? ?Patient: Jimmy Tucker KAJ:[id number] DOB: 09-07-1960 DOA: 09/04/2021     4 ?DOS: the patient was seen and examined on 09/01/2021 ?  ?Brief hospital admission narrative course ?As per H&P written by Dr. Denton Brick on 09/01/2021 ?Jimmy Tucker is a 61 y.o. male with medical history significant for esophageal cancer, hypertension, coronary artery disease, atrial fibrillation, COPD, alcohol abuse. ?Patient was brought to the ED from home with reports of multiple complaints.  Family-spouse and daughter are present at bedside and assist with the history.  They report confusion today.  But over the past several days, since patient had his esophageal dilatation 2/22, he reports poor oral intake, has barely eaten, neurolysed weakness, patient unable to stand over the past few days, cannot walk.  He reports feeling dizzy a week ago, resulting in a fall twice that day.  Reports a feeling of panic attack today with transient difficulty breathing.  But denies specific palpitations.  He denies chest pain. ?  ?ED Course: Tachycardic, heart rate up to 165 on EKG.  Temperature 97.6.  Respiratory rate 17-32.  Blood pressure systolic initially 035/59.  Sodium 124.  Potassium 3.  Magnesium 1.8.  TSH 4.699.  Troponin 7 > 11 EKG shows atrial fibrillation with RVR.  He was given Cardizem with subsequent drop in blood pressure to 74/64.  1 L bolus was given.  Cardizem discontinued.  ED provider talked to cardiology, okay with amiodarone and heparin drip.  And cardiology team to see in the morning. ? ?Assessment and Plan: ?PAFib-- ?-He has a history of paroxysmal A-fib for which he was on anticoagulation with Xarelto per notes from 2020.   ?-Normal TSH ?-2D echo demonstrating grade 1 diastolic dysfunction; no wall motion abnormalities and preserved ejection fraction. ?-Developed hypotension on IV Cardizem ?-Following cardiology recommendations amiodarone drip and Lopressor has  been used; patient has now cardioverted to sinus rhythm. ?-c/n iv heparin drip thru 09/03/21 ?- patient is not a candidate for long-term anticoagulation.   ?--CHADsVASC score 2 ?- unable to tolerate oral intake so continue IV amiodarone ? ?Fall at home, initial encounter ?-B12, ammonia level and TSH within normal limits. ?-Family reporting difficulty sleeping and chronic use of pain medications and benzodiazepines as an outpatient. ?-More coherent, ?- PT eval once medically more stable ? ?Electrolyte abnormalities/-Patient with hyponatremia, hypokalemia, hypomagnesemia and hypophosphatemia ?-Continue to follow electrolytes trend and further replete them as needed ? ?Failure to thrive in adult-in the setting of esophageal stricture ?-Patient with significant quality decline and multiple falls at home in the setting of failure to thrive, deconditioning and poor balance. ?-Chest CTA negative for PE, suggest nonspecific infectious or inflammatory bronchitis.  He is afebrile and without leukocytosis. ?-CT head without acute intracranial abnormalities. ?- Poor p.o. intake secondary to severe esophageal stricture.  Patient is not a candidate for gastrostomy tube placement due to the esophageal stricture and the intrathoracic placement of the stomach ?-As per Dr. Owens Shark from IR not a candidate for IR placement of PEG tube ?-As per general surgeon possible open J-tube placement on 09/16/2021 ? ?Esophageal stricture ?-With failure to thrive.  Recurrent issue for patient,, has had over 50 EGDs and dilatation related to esophageal cancer for which he had surgery in the past.  ?--GI input appreciated ? ?Hypertension ?-Soft blood pressure at time of presentation in the setting of arrhythmia and Cardizem drip ?-Now stable. ?-Continue scheduled IV Lopressor as recommended by cardiology service and as needed IV hydralazine. ? ?Stage II pressure  injury:  ?-Appreciated in left buttocks present at time of admission ?-Continue constant  repositioning and barrier measures. ? ?Metabolic encephalopathy ?-In the setting of hospital-acquired delirium and most likely mild withdrawal from chronic pain medications and benzos that he normally uses as an outpatient. ?-Restarting as needed IV Dilaudid for chronic pain management ?-Patient with adverse reactions to the use of lorazepam ?-Planning to resume home Xanax when able to take by mouth. ? ?Subjective:  ?wife and sister in law are at bedside, ?-tolerating some liquids ? ?Physical Exam: ?Vitals:  ? 09/01/21 1200 09/01/21 1300 09/01/21 1400 09/01/21 1500  ?BP: 123/79 (!) 125/59 (!) 147/75 (!) 160/68  ?Pulse: 69 68 63 62  ?Resp: (!) '24 17 17 20  '$ ?Temp:      ?TempSrc:      ?SpO2: 100% 98% 100% 100%  ?Weight:      ?Height:      ? ? ?Physical Exam ? ?Gen:- Awake Alert, in no acute distress, Frail and emaciated appearing ?HEENT:- [place].AT, No sclera icterus ?Neck-Supple Neck,No JVD,.  ?Lungs-  CTAB , fair air movement bilaterally  ?CV- S1, S2 normal, RRR ?Abd-  +ve B.Sounds, Abd Soft, No tenderness,    ?Extremity/Skin:- No  edema,   good pedal pulses  ?Psych-affect is appropriate, oriented x3 ?Neuro-generalized weakness no new focal deficits, no tremors ? ? ?Family Communication: Wife and sister in law at bedside. ? ?  Code Status: Full Code ? ? ?Disposition: Awaiting open J tube placement and tolerance of Tube feeding ?Status is: Inpatient ? ?Remains inpatient appropriate because: Requiring IV amiodarone and IV heparin drip, pending  J-tube placement and tolerance of nutrition ?- ? Planned Discharge Destination: Anticipate discharge possibly home with home --in 2 to 3 days ? ? ?Author: ?Roxan Hockey, MD ?09/01/2021 4:01 PM ? ?For on call review [email or web address].  ?

## 2021-09-01 NOTE — H&P (View-Only) (Signed)
Reason for Consult: Assess for jejunostomy tube placement Referring Physician: Dr. Emokpae  Jimmy Tucker is an 61 y.o. male.  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  He last underwent dilatation 2 weeks ago.  He could only be dilated to approximately 11 mm.  He has had continuing poor p.o. intake and palliative care has seen the patient about possible feeding tube placement.  He did have a complicated course after his esophagectomy and had a jejunostomy tube at that time.  He is only able to maintain a liquid diet at the present time.  History is variable due to patient factors.  Past Medical History:  Diagnosis Date   Abnormal nuclear stress test    Anxiety    Cancer (HCC)    Carotid artery disease (HCC)    Chronic back pain    Chronic lower back pain    "L4-5"   COPD (chronic obstructive pulmonary disease) (HCC)    Esophageal cancer (HCC)    Heavy cigarette smoker (20-39 per day)    Unmotivated   Hyperlipidemia LDL goal <70    Intolerant to statins   Lumbar disc disease    PAD (peripheral artery disease) ([place]) 10/30/2012   a) 2011 LEA Dopplers: Left ATA occlusion; b) 10/2013: LEA Dopplers - L Common Iliac & CFA occlusion - w/ short reconstitution at the branch point of the external and internal iliac, the external iliac is occluded through the common femoral and proximal SFA; reconstitutes in the proximal SFA. Two-vessel runoff beyond.; c) s/p LCIA Stent & CFA EA w/ patch angioplasty --> f/u doppler 12/01/2013 patent   Pollen allergies    Presence of stent in artery     Past Surgical History:  Procedure Laterality Date   BACK SURGERY  02/2008   BALLOON DILATION  08/17/2021   Procedure: BALLOON DILATION;  Surgeon: Harvel Quale, MD;  [facility];  Service:  Gastroenterology;;   BIOPSY  04/12/2021   Procedure: BIOPSY;  Surgeon: Harvel Quale, MD;  [facility];  Service: Gastroenterology;;   CARDIAC CATHETERIZATION N/A 02/15/2015   Procedure: Left Heart Cath and Coronary Angiography;  Surgeon: Lorretta Harp, MD;  Location: [facility];  Service: Cardiovascular;  Laterality: N/A;   COLONOSCOPY  07/24/2011   Procedure: COLONOSCOPY;  Surgeon: Daneil Dolin, MD;  [facility];  Service: Endoscopy;  Laterality: N/A;  8:15   CORONARY STENT PLACEMENT     ENDARTERECTOMY FEMORAL Left 11/11/2013   Procedure: Left Femoral Endarterectomy with Patch Angioplasty ;  Surgeon: Angelia Mould, MD;  Location: [facility];  Service: Vascular;  Laterality: Left;   ESOPHAGEAL DILATION N/A 08/29/2019   Procedure: ESOPHAGEAL DILATION;  Surgeon: Rogene Houston, MD;  [facility];  Service: Endoscopy;  Laterality: N/A;   ESOPHAGEAL DILATION N/A 03/10/2021   Procedure: ESOPHAGEAL DILATION;  Surgeon: Rogene Houston, MD;  [facility];  Service: Endoscopy;  Laterality: N/A;   ESOPHAGEAL DILATION  06/14/2021   Procedure: ESOPHAGEAL DILATION;  Surgeon: Harvel Quale, MD;  [facility];  Service: Gastroenterology;;   ESOPHAGOGASTRODUODENOSCOPY N/A 10/09/2014   Procedure: ESOPHAGOGASTRODUODENOSCOPY (EGD);  Surgeon: Rogene Houston, MD;  [facility];  Service: Endoscopy;  Laterality: N/A;  230 - moved to 9:55 - Ann notified pt to arrive  at 8:55   ESOPHAGOGASTRODUODENOSCOPY (EGD) WITH PROPOFOL N/A 07/21/2019   Procedure: ESOPHAGOGASTRODUODENOSCOPY (EGD) WITH PROPOFOL;  Surgeon: Rogene Houston, MD;  [facility];  Service: Endoscopy;  Laterality: N/A;  11:55   ESOPHAGOGASTRODUODENOSCOPY (EGD) WITH PROPOFOL N/A 08/29/2019   Procedure: ESOPHAGOGASTRODUODENOSCOPY (EGD) WITH PROPOFOL;  Surgeon: Rogene Houston, MD;  [facility];  Service: Endoscopy;   Laterality: N/A;  730   ESOPHAGOGASTRODUODENOSCOPY (EGD) WITH PROPOFOL N/A 03/10/2021   Procedure: ESOPHAGOGASTRODUODENOSCOPY (EGD) WITH PROPOFOL;  Surgeon: Rogene Houston, MD;  [facility];  Service: Endoscopy;  Laterality: N/A;  1:20 / Following Dr Gala Romney in Room 3 at the end Per Dr Laural Golden - per office ok'd by Dr. Gala Romney   ESOPHAGOGASTRODUODENOSCOPY (EGD) WITH PROPOFOL N/A 04/12/2021   Procedure: ESOPHAGOGASTRODUODENOSCOPY (EGD) WITH PROPOFOL;  Surgeon: Harvel Quale, MD;  [facility];  Service: Gastroenterology;  Laterality: N/A;  8:30   ESOPHAGOGASTRODUODENOSCOPY (EGD) WITH PROPOFOL N/A 06/14/2021   Procedure: ESOPHAGOGASTRODUODENOSCOPY (EGD) WITH PROPOFOL;  Surgeon: Harvel Quale, MD;  [facility];  Service: Gastroenterology;  Laterality: N/A;  7:30   ESOPHAGOGASTRODUODENOSCOPY (EGD) WITH PROPOFOL N/A 08/17/2021   1 severe intrinsic stenosis 25 cm from incisors, dilated with ultraslim pediatric, injected with 42m of kenalog, 1 non-bleeding gastric ulcer with clean base (Forrest Classs III), normal duodenum   ILIAC ARTERY STENT Left 11/03/2013   Dr. BJudithann Sauger  [facility]  08/03/2018   KENALOG INJECTION  08/17/2021   Procedure: KENALOG INJECTION;  Surgeon: CHarvel Quale MD;  [facility];  Service: Gastroenterology;;   LOWER EXTREMITY ANGIOGRAM N/A 11/03/2013   Procedure: LOWER EXTREMITY ANGIOGRAM;  Surgeon: JLorretta Harp MD;  [facility];  Service: Cardiovascular;  Laterality: N/A;   LUMBAR MICRODISCECTOMY Left 02/2008   L4-5   NM MYOCAR PERF WALL MOTION  07/2009   persantine - normal perfusion   SURGERY SCROTAL / TESTICULAR Left    cyst excision   THORACIC ESOPHAGUS REPLACEMENT      Family History  Problem Relation Age of Onset   Heart attack Father 554      Cardiac arrest   Sudden death Father    Stroke Brother 550  Stroke Mother    Colon cancer  Neg Hx     Social History:  reports that he has been smoking cigarettes. He has a 20.00 pack-year smoking history. He has never used smokeless tobacco. He reports current alcohol use of about 21.0 standard drinks per week. He reports that he does not use drugs.  Allergies:  Allergies  Allergen Reactions   Drug [Tape]     PLEASE USE COBAN ,,,, ADHESIVE TAPE TEARS SKIN    Glycopyrrolate Other (See Comments)    Intolerance--unknown reaction   Warfarin And Related Other (See Comments)    Excessive bleeding    Medications: I have reviewed the patient's current medications.  Results for orders placed or performed during the hospital encounter of 08/29/2021 (from the past 48 hour(s))  Heparin level (unfractionated)     Status: Abnormal   Collection Time: 09/21/2021  8:14 PM  Result Value Ref Range   Heparin Unfractionated 1.02 (H) 0.30 - 0.70 IU/mL    Comment: (NOTE) The clinical reportable range upper limit is being lowered to >1.10 to align with the FDA approved guidance for the current laboratory assay.  If heparin results are below expected values, and patient dosage has  been confirmed,  suggest follow up testing of antithrombin III levels. Performed at [facility], [address] [place], [place] [address]   CBC     Status: Abnormal   Collection Time: 08/31/21  6:08 AM  Result Value Ref Range   WBC 7.6 4.0 - 10.5 K/uL   RBC 2.59 (L) 4.22 - 5.81 MIL/uL   Hemoglobin 8.4 (L) 13.0 - 17.0 g/dL   HCT 23.4 (L) 39.0 - 52.0 %   MCV 90.3 80.0 - 100.0 fL   MCH 32.4 26.0 - 34.0 pg   MCHC 35.9 30.0 - 36.0 g/dL   RDW 12.5 11.5 - 15.5 %   Platelets 121 (L) 150 - 400 K/uL    Comment: SPECIMEN CHECKED FOR CLOTS Immature Platelet Fraction may be clinically indicated, consider ordering this additional test [id number]    nRBC 0.3 (H) 0.0 - 0.2 %    Comment: Performed at [facility], [address], [place], [place] [address]  Folate, serum, performed at [facility] lab     Status: Abnormal    Collection Time: 08/31/21  6:08 AM  Result Value Ref Range   Folate 2.6 (L) >5.9 ng/mL    Comment: Performed at [facility], [address], [place], [place] [address]  Heparin level (unfractionated)     Status: None   Collection Time: 08/31/21  6:08 AM  Result Value Ref Range   Heparin Unfractionated 0.40 0.30 - 0.70 IU/mL    Comment: (NOTE) The clinical reportable range upper limit is being lowered to >1.10 to align with the FDA approved guidance for the current laboratory assay.  If heparin results are below expected values, and patient dosage has  been confirmed, suggest follow up testing of antithrombin III levels. Performed at [facility], [address] [place], [place] [address]   Heparin level (unfractionated)     Status: Abnormal   Collection Time: 08/31/21 12:05 PM  Result Value Ref Range   Heparin Unfractionated 0.25 (L) 0.30 - 0.70 IU/mL    Comment: (NOTE) The clinical reportable range upper limit is being lowered to >1.10 to align with the FDA approved guidance for the current laboratory assay.  If heparin results are below expected values, and patient dosage has  been confirmed, suggest follow up testing of antithrombin III levels. Performed at [facility], [address] [place], [place] [address]   Vitamin B12     Status: None   Collection Time: 08/31/21 12:30 PM  Result Value Ref Range   Vitamin B-12 766 180 - 914 pg/mL    Comment: (NOTE) This assay is not validated for testing neonatal or myeloproliferative syndrome specimens for Vitamin B12 levels. Performed at [facility], [address] [place], [place] [address]   VITAMIN D 25 Hydroxy (Vit-D Deficiency, Fractures)     Status: None   Collection Time: 08/31/21 12:49 PM  Result Value Ref Range   Vit D, 25-Hydroxy 72.06 30 - 100 ng/mL    Comment: (NOTE) Vitamin D deficiency has been defined by the [place] practice guideline as a level of serum 25-OH   vitamin D less than 20 ng/mL (1,2). The Endocrine Society went on to  further define vitamin D insufficiency as a level between 21 and 29  ng/mL (2).  1. IOM (Institute of Medicine). 2010. Dietary reference intakes for  calcium and D. [place]: The [employer]. 2. Holick MF, Binkley Sattley, Bischoff-Ferrari HA, et al. Evaluation,  treatment, and prevention of vitamin D  deficiency: an Endocrine  Society clinical practice guideline, JCEM. 2011 Jul; 96(7): 1911-30.  Performed at [facility], [place] [address], [place], [place] [address]   Heparin level (unfractionated)     Status: None   Collection Time: 08/31/21  9:24 PM  Result Value Ref Range   Heparin Unfractionated 0.42 0.30 - 0.70 IU/mL    Comment: (NOTE) The clinical reportable range upper limit is being lowered to >1.10 to align with the FDA approved guidance for the current laboratory assay.  If heparin results are below expected values, and patient dosage has  been confirmed, suggest follow up testing of antithrombin III levels. Performed at [facility], [address], [place], [place] [address]   CBC     Status: Abnormal   Collection Time: 09/01/21  3:14 AM  Result Value Ref Range   WBC 5.4 4.0 - 10.5 K/uL   RBC 2.40 (L) 4.22 - 5.81 MIL/uL   Hemoglobin 8.1 (L) 13.0 - 17.0 g/dL   HCT 22.9 (L) 39.0 - 52.0 %   MCV 95.4 80.0 - 100.0 fL   MCH 33.8 26.0 - 34.0 pg   MCHC 35.4 30.0 - 36.0 g/dL   RDW 12.8 11.5 - 15.5 %   Platelets 114 (L) 150 - 400 K/uL    Comment: SPECIMEN CHECKED FOR CLOTS Immature Platelet Fraction may be clinically indicated, consider ordering this additional test [id number] PLATELET COUNT CONFIRMED BY SMEAR    nRBC 0.0 0.0 - 0.2 %    Comment: Performed at [facility], [address], [place], [place] [address]  Renal function panel     Status: Abnormal   Collection Time: 09/01/21  3:14 AM  Result Value Ref Range   Sodium 128 (L) 135 - 145 mmol/L   Potassium 3.5 3.5 - 5.1  mmol/L   Chloride 98 98 - 111 mmol/L   CO2 24 22 - 32 mmol/L   Glucose, Bld 103 (H) 70 - 99 mg/dL    Comment: Glucose reference range applies only to samples taken after fasting for at least 8 hours.   BUN 8 6 - 20 mg/dL   Creatinine, Ser 0.67 0.61 - 1.24 mg/dL   Calcium 8.4 (L) 8.9 - 10.3 mg/dL   Phosphorus 2.4 (L) 2.5 - 4.6 mg/dL   Albumin 2.4 (L) 3.5 - 5.0 g/dL   GFR, Estimated >60 >60 mL/min    Comment: (NOTE) Calculated using the CKD-EPI Creatinine Equation (2021)    Anion gap 6 5 - 15    Comment: Performed at [facility], [address], [place], [place] [address]  Heparin level (unfractionated)     Status: None   Collection Time: 09/01/21  3:14 AM  Result Value Ref Range   Heparin Unfractionated 0.47 0.30 - 0.70 IU/mL    Comment: (NOTE) The clinical reportable range upper limit is being lowered to >1.10 to align with the FDA approved guidance for the current laboratory assay.  If heparin results are below expected values, and patient dosage has  been confirmed, suggest follow up testing of antithrombin III levels. Performed at [facility], [address], [place],  [address]   Heparin level (unfractionated)     Status: None   Collection Time: 09/01/21 11:08 AM  Result Value Ref Range   Heparin Unfractionated 0.47 0.30 - 0.70 IU/mL    Comment: (NOTE) The clinical reportable range upper limit is being lowered to >1.10 to align with the FDA approved guidance for the current laboratory assay.  If heparin  results are below expected values, and patient dosage has  been confirmed, suggest follow up testing of antithrombin III levels. Performed at [facility], [address], [place], [place] [address]     No results found.  ROS:  Pertinent items are noted in HPI.  Blood pressure (!) 147/75, pulse 63, temperature 97.8 F (36.6 C), temperature source Oral, resp. rate 17, height '5\' 6"'$  (1.676 m), weight 48.9 kg, SpO2 100 %. Physical Exam: Cachectic white male no  acute distress Lungs clear to auscultation with equal breath sounds bilaterally Heart examination reveals regular rate rhythm without S3, S4, murmurs Abdomen is soft and flat.  An upper midline surgical scar is present.  CT scan of chest images personally reviewed.  Previous operative notes from [facility] and GI reviewed  Assessment/Plan: Impression: Poor p.o. intake secondary to severe esophageal stricture.  Patient is not a candidate for gastrostomy tube placement due to the esophageal stricture and the intrathoracic placement of the stomach.  I reviewed these findings with the patient and family present.  I can try to place an open jejunostomy tube.  He has had multiple abdominal surgeries due to complications after his esophagectomy.  I told the patient that should there be many intra-abdominal adhesions, I would not be able to place the jejunostomy tube and then we would have to switch to a PICC line/TPN.  The risks and benefits of the procedure including bleeding, infection, cardiopulmonary difficulties, the probable need for blood transfusion, and bowel injury were fully explained to the patient, who gave informed consent.  Jejunostomy tube placement is scheduled for tomorrow.  Aviva Signs 09/01/2021, 2:22 PM

## 2021-09-01 NOTE — Consult Note (Signed)
Reason for Consult: Assess for jejunostomy tube placement Referring Physician: Dr. Emokpae  Jimmy Tucker is an 61 y.o. male.  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  He last underwent dilatation 2 weeks ago.  He could only be dilated to approximately 11 mm.  He has had continuing poor p.o. intake and palliative care has seen the patient about possible feeding tube placement.  He did have a complicated course after his esophagectomy and had a jejunostomy tube at that time.  He is only able to maintain a liquid diet at the present time.  History is variable due to patient factors.  Past Medical History:  Diagnosis Date   Abnormal nuclear stress test    Anxiety    Cancer (HCC)    Carotid artery disease (HCC)    Chronic back pain    Chronic lower back pain    "L4-5"   COPD (chronic obstructive pulmonary disease) (HCC)    Esophageal cancer (HCC)    Heavy cigarette smoker (20-39 per day)    Unmotivated   Hyperlipidemia LDL goal <70    Intolerant to statins   Lumbar disc disease    PAD (peripheral artery disease) ([place]) 10/30/2012   a) 2011 LEA Dopplers: Left ATA occlusion; b) 10/2013: LEA Dopplers - L Common Iliac & CFA occlusion - w/ short reconstitution at the branch point of the external and internal iliac, the external iliac is occluded through the common femoral and proximal SFA; reconstitutes in the proximal SFA. Two-vessel runoff beyond.; c) s/p LCIA Stent & CFA EA w/ patch angioplasty --> f/u doppler 12/01/2013 patent   Pollen allergies    Presence of stent in artery     Past Surgical History:  Procedure Laterality Date   BACK SURGERY  02/2008   BALLOON DILATION  08/17/2021   Procedure: BALLOON DILATION;  Surgeon: Harvel Quale, MD;  [facility];  Service:  Gastroenterology;;   BIOPSY  04/12/2021   Procedure: BIOPSY;  Surgeon: Harvel Quale, MD;  [facility];  Service: Gastroenterology;;   CARDIAC CATHETERIZATION N/A 02/15/2015   Procedure: Left Heart Cath and Coronary Angiography;  Surgeon: Lorretta Harp, MD;  Location: [facility];  Service: Cardiovascular;  Laterality: N/A;   COLONOSCOPY  07/24/2011   Procedure: COLONOSCOPY;  Surgeon: Daneil Dolin, MD;  [facility];  Service: Endoscopy;  Laterality: N/A;  8:15   CORONARY STENT PLACEMENT     ENDARTERECTOMY FEMORAL Left 11/11/2013   Procedure: Left Femoral Endarterectomy with Patch Angioplasty ;  Surgeon: Angelia Mould, MD;  Location: [facility];  Service: Vascular;  Laterality: Left;   ESOPHAGEAL DILATION N/A 08/29/2019   Procedure: ESOPHAGEAL DILATION;  Surgeon: Rogene Houston, MD;  [facility];  Service: Endoscopy;  Laterality: N/A;   ESOPHAGEAL DILATION N/A 03/10/2021   Procedure: ESOPHAGEAL DILATION;  Surgeon: Rogene Houston, MD;  [facility];  Service: Endoscopy;  Laterality: N/A;   ESOPHAGEAL DILATION  06/14/2021   Procedure: ESOPHAGEAL DILATION;  Surgeon: Harvel Quale, MD;  [facility];  Service: Gastroenterology;;   ESOPHAGOGASTRODUODENOSCOPY N/A 10/09/2014   Procedure: ESOPHAGOGASTRODUODENOSCOPY (EGD);  Surgeon: Rogene Houston, MD;  [facility];  Service: Endoscopy;  Laterality: N/A;  230 - moved to 9:55 - Ann notified pt to arrive  at 8:55   ESOPHAGOGASTRODUODENOSCOPY (EGD) WITH PROPOFOL N/A 07/21/2019   Procedure: ESOPHAGOGASTRODUODENOSCOPY (EGD) WITH PROPOFOL;  Surgeon: Rogene Houston, MD;  [facility];  Service: Endoscopy;  Laterality: N/A;  11:55   ESOPHAGOGASTRODUODENOSCOPY (EGD) WITH PROPOFOL N/A 08/29/2019   Procedure: ESOPHAGOGASTRODUODENOSCOPY (EGD) WITH PROPOFOL;  Surgeon: Rogene Houston, MD;  [facility];  Service: Endoscopy;   Laterality: N/A;  730   ESOPHAGOGASTRODUODENOSCOPY (EGD) WITH PROPOFOL N/A 03/10/2021   Procedure: ESOPHAGOGASTRODUODENOSCOPY (EGD) WITH PROPOFOL;  Surgeon: Rogene Houston, MD;  [facility];  Service: Endoscopy;  Laterality: N/A;  1:20 / Following Dr Gala Romney in Room 3 at the end Per Dr Laural Golden - per office ok'd by Dr. Gala Romney   ESOPHAGOGASTRODUODENOSCOPY (EGD) WITH PROPOFOL N/A 04/12/2021   Procedure: ESOPHAGOGASTRODUODENOSCOPY (EGD) WITH PROPOFOL;  Surgeon: Harvel Quale, MD;  [facility];  Service: Gastroenterology;  Laterality: N/A;  8:30   ESOPHAGOGASTRODUODENOSCOPY (EGD) WITH PROPOFOL N/A 06/14/2021   Procedure: ESOPHAGOGASTRODUODENOSCOPY (EGD) WITH PROPOFOL;  Surgeon: Harvel Quale, MD;  [facility];  Service: Gastroenterology;  Laterality: N/A;  7:30   ESOPHAGOGASTRODUODENOSCOPY (EGD) WITH PROPOFOL N/A 08/17/2021   1 severe intrinsic stenosis 25 cm from incisors, dilated with ultraslim pediatric, injected with 13m of kenalog, 1 non-bleeding gastric ulcer with clean base (Forrest Classs III), normal duodenum   ILIAC ARTERY STENT Left 11/03/2013   Dr. BJudithann Sauger  [facility]  08/03/2018   KENALOG INJECTION  08/17/2021   Procedure: KENALOG INJECTION;  Surgeon: CHarvel Quale MD;  [facility];  Service: Gastroenterology;;   LOWER EXTREMITY ANGIOGRAM N/A 11/03/2013   Procedure: LOWER EXTREMITY ANGIOGRAM;  Surgeon: JLorretta Harp MD;  [facility];  Service: Cardiovascular;  Laterality: N/A;   LUMBAR MICRODISCECTOMY Left 02/2008   L4-5   NM MYOCAR PERF WALL MOTION  07/2009   persantine - normal perfusion   SURGERY SCROTAL / TESTICULAR Left    cyst excision   THORACIC ESOPHAGUS REPLACEMENT      Family History  Problem Relation Age of Onset   Heart attack Father 517      Cardiac arrest   Sudden death Father    Stroke Brother 516  Stroke Mother    Colon cancer  Neg Hx     Social History:  reports that he has been smoking cigarettes. He has a 20.00 pack-year smoking history. He has never used smokeless tobacco. He reports current alcohol use of about 21.0 standard drinks per week. He reports that he does not use drugs.  Allergies:  Allergies  Allergen Reactions   Drug [Tape]     PLEASE USE COBAN ,,,, ADHESIVE TAPE TEARS SKIN    Glycopyrrolate Other (See Comments)    Intolerance--unknown reaction   Warfarin And Related Other (See Comments)    Excessive bleeding    Medications: I have reviewed the patient's current medications.  Results for orders placed or performed during the hospital encounter of 09/09/2021 (from the past 48 hour(s))  Heparin level (unfractionated)     Status: Abnormal   Collection Time: 08/27/2021  8:14 PM  Result Value Ref Range   Heparin Unfractionated 1.02 (H) 0.30 - 0.70 IU/mL    Comment: (NOTE) The clinical reportable range upper limit is being lowered to >1.10 to align with the FDA approved guidance for the current laboratory assay.  If heparin results are below expected values, and patient dosage has  been confirmed,  suggest follow up testing of antithrombin III levels. Performed at [facility], [address], [place], [place] [address]   CBC     Status: Abnormal   Collection Time: 08/31/21  6:08 AM  Result Value Ref Range   WBC 7.6 4.0 - 10.5 K/uL   RBC 2.59 (L) 4.22 - 5.81 MIL/uL   Hemoglobin 8.4 (L) 13.0 - 17.0 g/dL   HCT 23.4 (L) 39.0 - 52.0 %   MCV 90.3 80.0 - 100.0 fL   MCH 32.4 26.0 - 34.0 pg   MCHC 35.9 30.0 - 36.0 g/dL   RDW 12.5 11.5 - 15.5 %   Platelets 121 (L) 150 - 400 K/uL    Comment: SPECIMEN CHECKED FOR CLOTS Immature Platelet Fraction may be clinically indicated, consider ordering this additional test [id number]    nRBC 0.3 (H) 0.0 - 0.2 %    Comment: Performed at [facility], [address] [place], [place] [address]  Folate, serum, performed at [facility] lab     Status: Abnormal    Collection Time: 08/31/21  6:08 AM  Result Value Ref Range   Folate 2.6 (L) >5.9 ng/mL    Comment: Performed at [facility], [address], [place], [place] [address]  Heparin level (unfractionated)     Status: None   Collection Time: 08/31/21  6:08 AM  Result Value Ref Range   Heparin Unfractionated 0.40 0.30 - 0.70 IU/mL    Comment: (NOTE) The clinical reportable range upper limit is being lowered to >1.10 to align with the FDA approved guidance for the current laboratory assay.  If heparin results are below expected values, and patient dosage has  been confirmed, suggest follow up testing of antithrombin III levels. Performed at [facility], [address], [place], [place] [address]   Heparin level (unfractionated)     Status: Abnormal   Collection Time: 08/31/21 12:05 PM  Result Value Ref Range   Heparin Unfractionated 0.25 (L) 0.30 - 0.70 IU/mL    Comment: (NOTE) The clinical reportable range upper limit is being lowered to >1.10 to align with the FDA approved guidance for the current laboratory assay.  If heparin results are below expected values, and patient dosage has  been confirmed, suggest follow up testing of antithrombin III levels. Performed at [facility], [address], [place], [place] [address]   Vitamin B12     Status: None   Collection Time: 08/31/21 12:30 PM  Result Value Ref Range   Vitamin B-12 766 180 - 914 pg/mL    Comment: (NOTE) This assay is not validated for testing neonatal or myeloproliferative syndrome specimens for Vitamin B12 levels. Performed at [facility], [address] [place], [place] [address]   VITAMIN D 25 Hydroxy (Vit-D Deficiency, Fractures)     Status: None   Collection Time: 08/31/21 12:49 PM  Result Value Ref Range   Vit D, 25-Hydroxy 72.06 30 - 100 ng/mL    Comment: (NOTE) Vitamin D deficiency has been defined by the [place] practice guideline as a level of serum 25-OH   vitamin D less than 20 ng/mL (1,2). The Endocrine Society went on to  further define vitamin D insufficiency as a level between 21 and 29  ng/mL (2).  1. IOM (Institute of Medicine). 2010. Dietary reference intakes for  calcium and D. [place]: The [employer]. 2. Holick MF, Binkley Cobden, Bischoff-Ferrari HA, et al. Evaluation,  treatment, and prevention of vitamin D  deficiency: an Endocrine  Society clinical practice guideline, JCEM. 2011 Jul; 96(7): 1911-30.  Performed at [facility], [place] [address], [place], [place] [address]   Heparin level (unfractionated)     Status: None   Collection Time: 08/31/21  9:24 PM  Result Value Ref Range   Heparin Unfractionated 0.42 0.30 - 0.70 IU/mL    Comment: (NOTE) The clinical reportable range upper limit is being lowered to >1.10 to align with the FDA approved guidance for the current laboratory assay.  If heparin results are below expected values, and patient dosage has  been confirmed, suggest follow up testing of antithrombin III levels. Performed at [facility], [address] [place], [place] [address]   CBC     Status: Abnormal   Collection Time: 09/01/21  3:14 AM  Result Value Ref Range   WBC 5.4 4.0 - 10.5 K/uL   RBC 2.40 (L) 4.22 - 5.81 MIL/uL   Hemoglobin 8.1 (L) 13.0 - 17.0 g/dL   HCT 22.9 (L) 39.0 - 52.0 %   MCV 95.4 80.0 - 100.0 fL   MCH 33.8 26.0 - 34.0 pg   MCHC 35.4 30.0 - 36.0 g/dL   RDW 12.8 11.5 - 15.5 %   Platelets 114 (L) 150 - 400 K/uL    Comment: SPECIMEN CHECKED FOR CLOTS Immature Platelet Fraction may be clinically indicated, consider ordering this additional test [id number] PLATELET COUNT CONFIRMED BY SMEAR    nRBC 0.0 0.0 - 0.2 %    Comment: Performed at [facility], [address], [place], [place] [address]  Renal function panel     Status: Abnormal   Collection Time: 09/01/21  3:14 AM  Result Value Ref Range   Sodium 128 (L) 135 - 145 mmol/L   Potassium 3.5 3.5 - 5.1  mmol/L   Chloride 98 98 - 111 mmol/L   CO2 24 22 - 32 mmol/L   Glucose, Bld 103 (H) 70 - 99 mg/dL    Comment: Glucose reference range applies only to samples taken after fasting for at least 8 hours.   BUN 8 6 - 20 mg/dL   Creatinine, Ser 0.67 0.61 - 1.24 mg/dL   Calcium 8.4 (L) 8.9 - 10.3 mg/dL   Phosphorus 2.4 (L) 2.5 - 4.6 mg/dL   Albumin 2.4 (L) 3.5 - 5.0 g/dL   GFR, Estimated >60 >60 mL/min    Comment: (NOTE) Calculated using the CKD-EPI Creatinine Equation (2021)    Anion gap 6 5 - 15    Comment: Performed at [facility], [address], [place], [place] [address]  Heparin level (unfractionated)     Status: None   Collection Time: 09/01/21  3:14 AM  Result Value Ref Range   Heparin Unfractionated 0.47 0.30 - 0.70 IU/mL    Comment: (NOTE) The clinical reportable range upper limit is being lowered to >1.10 to align with the FDA approved guidance for the current laboratory assay.  If heparin results are below expected values, and patient dosage has  been confirmed, suggest follow up testing of antithrombin III levels. Performed at [facility], [address] [place], [place] [address]   Heparin level (unfractionated)     Status: None   Collection Time: 09/01/21 11:08 AM  Result Value Ref Range   Heparin Unfractionated 0.47 0.30 - 0.70 IU/mL    Comment: (NOTE) The clinical reportable range upper limit is being lowered to >1.10 to align with the FDA approved guidance for the current laboratory assay.  If heparin  results are below expected values, and patient dosage has  been confirmed, suggest follow up testing of antithrombin III levels. Performed at [facility], [address], [place], [place] [address]     No results found.  ROS:  Pertinent items are noted in HPI.  Blood pressure (!) 147/75, pulse 63, temperature 97.8 F (36.6 C), temperature source Oral, resp. rate 17, height '5\' 6"'$  (1.676 m), weight 48.9 kg, SpO2 100 %. Physical Exam: Cachectic white male no  acute distress Lungs clear to auscultation with equal breath sounds bilaterally Heart examination reveals regular rate rhythm without S3, S4, murmurs Abdomen is soft and flat.  An upper midline surgical scar is present.  CT scan of chest images personally reviewed.  Previous operative notes from [facility] and GI reviewed  Assessment/Plan: Impression: Poor p.o. intake secondary to severe esophageal stricture.  Patient is not a candidate for gastrostomy tube placement due to the esophageal stricture and the intrathoracic placement of the stomach.  I reviewed these findings with the patient and family present.  I can try to place an open jejunostomy tube.  He has had multiple abdominal surgeries due to complications after his esophagectomy.  I told the patient that should there be many intra-abdominal adhesions, I would not be able to place the jejunostomy tube and then we would have to switch to a PICC line/TPN.  The risks and benefits of the procedure including bleeding, infection, cardiopulmonary difficulties, the probable need for blood transfusion, and bowel injury were fully explained to the patient, who gave informed consent.  Jejunostomy tube placement is scheduled for tomorrow.  Aviva Signs 09/01/2021, 2:22 PM

## 2021-09-01 NOTE — Plan of Care (Signed)

## 2021-09-02 ENCOUNTER — Encounter (HOSPITAL_COMMUNITY): Payer: Self-pay | Admitting: Internal Medicine

## 2021-09-02 ENCOUNTER — Inpatient Hospital Stay (HOSPITAL_COMMUNITY): Payer: Medicare Other | Admitting: Anesthesiology

## 2021-09-02 ENCOUNTER — Encounter (HOSPITAL_COMMUNITY): Admission: EM | Disposition: E | Payer: Self-pay | Source: Home / Self Care | Attending: Family Medicine

## 2021-09-02 ENCOUNTER — Other Ambulatory Visit: Payer: Self-pay

## 2021-09-02 DIAGNOSIS — E43 Unspecified severe protein-calorie malnutrition: Secondary | ICD-10-CM

## 2021-09-02 DIAGNOSIS — K222 Esophageal obstruction: Secondary | ICD-10-CM

## 2021-09-02 DIAGNOSIS — E039 Hypothyroidism, unspecified: Secondary | ICD-10-CM

## 2021-09-02 DIAGNOSIS — I480 Paroxysmal atrial fibrillation: Secondary | ICD-10-CM | POA: Diagnosis not present

## 2021-09-02 DIAGNOSIS — I10 Essential (primary) hypertension: Secondary | ICD-10-CM

## 2021-09-02 HISTORY — PX: JEJUNOSTOMY: SHX313

## 2021-09-02 LAB — COMPREHENSIVE METABOLIC PANEL
ALT: 10 U/L (ref 0–44)
AST: 20 U/L (ref 15–41)
Albumin: 2.2 g/dL — ABNORMAL LOW (ref 3.5–5.0)
Alkaline Phosphatase: 36 U/L — ABNORMAL LOW (ref 38–126)
Anion gap: 6 (ref 5–15)
BUN: 7 mg/dL (ref 6–20)
CO2: 22 mmol/L (ref 22–32)
Calcium: 8.2 mg/dL — ABNORMAL LOW (ref 8.9–10.3)
Chloride: 98 mmol/L (ref 98–111)
Creatinine, Ser: 0.69 mg/dL (ref 0.61–1.24)
GFR, Estimated: >60 mL/min (ref >60)
Glucose, Bld: 86 mg/dL (ref 70–99)
Potassium: 3.2 mmol/L — ABNORMAL LOW (ref 3.5–5.1)
Sodium: 126 mmol/L — ABNORMAL LOW (ref 135–145)
Total Bilirubin: 0.5 mg/dL (ref 0.3–1.2)
Total Protein: 4.8 g/dL — ABNORMAL LOW (ref 6.5–8.1)

## 2021-09-02 LAB — CBC
HCT: 22.3 % — ABNORMAL LOW (ref 39.0–52.0)
HCT: 20.8 % — ABNORMAL LOW (ref 39.0–52.0)
Hemoglobin: 7.6 g/dL — ABNORMAL LOW (ref 13.0–17.0)
Hemoglobin: 7.2 g/dL — ABNORMAL LOW (ref 13.0–17.0)
MCH: 33 pg (ref 26.0–34.0)
MCH: 32.9 pg (ref 26.0–34.0)
MCHC: 34.1 g/dL (ref 30.0–36.0)
MCHC: 34.6 g/dL (ref 30.0–36.0)
MCV: 97 fL (ref 80.0–100.0)
MCV: 95 fL (ref 80.0–100.0)
Platelets: 115 10*3/uL — ABNORMAL LOW (ref 150–400)
Platelets: 120 10*3/uL — ABNORMAL LOW (ref 150–400)
RBC: 2.3 MIL/uL — ABNORMAL LOW (ref 4.22–5.81)
RBC: 2.19 MIL/uL — ABNORMAL LOW (ref 4.22–5.81)
RDW: 13 % (ref 11.5–15.5)
RDW: 13.1 % (ref 11.5–15.5)
WBC: 4.7 10*3/uL (ref 4.0–10.5)
WBC: 3.7 10*3/uL — ABNORMAL LOW (ref 4.0–10.5)
nRBC: 0.4 % — ABNORMAL HIGH (ref 0.0–0.2)
nRBC: 0 % (ref 0.0–0.2)

## 2021-09-02 LAB — PREPARE RBC (CROSSMATCH)

## 2021-09-02 LAB — VITAMIN C: Vitamin C: <0.1 mg/dL — ABNORMAL LOW (ref 0.4–2.0)

## 2021-09-02 SURGERY — CREATION, JEJUNOSTOMY
Anesthesia: General | Site: Abdomen

## 2021-09-02 MED ORDER — MIDAZOLAM HCL 2 MG/2ML IJ SOLN
INTRAMUSCULAR | Status: AC
  Filled 2021-09-02: qty 2

## 2021-09-02 MED ORDER — EPHEDRINE 5 MG/ML INJ
INTRAVENOUS | Status: AC
  Filled 2021-09-02: qty 5

## 2021-09-02 MED ORDER — ONDANSETRON HCL 4 MG/2ML IJ SOLN
INTRAMUSCULAR | Status: DC | PRN
  Administered 2021-09-02: 4 mg via INTRAVENOUS

## 2021-09-02 MED ORDER — PROPOFOL 10 MG/ML IV BOLUS
INTRAVENOUS | Status: AC
  Filled 2021-09-02: qty 20

## 2021-09-02 MED ORDER — HYDROMORPHONE HCL 1 MG/ML IJ SOLN
1.0000 mg | INTRAMUSCULAR | Status: DC | PRN
  Administered 2021-09-02 – 2021-09-04 (×6): 1 mg via INTRAVENOUS
  Filled 2021-09-02 (×6): qty 1

## 2021-09-02 MED ORDER — SODIUM CHLORIDE 0.9% IV SOLUTION: Freq: Once | INTRAVENOUS | Status: DC

## 2021-09-02 MED ORDER — FENTANYL CITRATE PF 50 MCG/ML IJ SOSY
50.0000 ug | PREFILLED_SYRINGE | INTRAMUSCULAR | Status: DC | PRN
  Administered 2021-09-02 (×3): 50 ug via INTRAVENOUS
  Filled 2021-09-02 (×2): qty 1

## 2021-09-02 MED ORDER — ROCURONIUM BROMIDE 10 MG/ML (PF) SYRINGE
PREFILLED_SYRINGE | INTRAVENOUS | Status: AC
  Filled 2021-09-02: qty 10

## 2021-09-02 MED ORDER — FENTANYL CITRATE (PF) 100 MCG/2ML IJ SOLN
INTRAMUSCULAR | Status: DC | PRN
  Administered 2021-09-02: 100 ug via INTRAVENOUS
  Administered 2021-09-02: 50 ug via INTRAVENOUS

## 2021-09-02 MED ORDER — FUROSEMIDE 10 MG/ML IJ SOLN
20.0000 mg | Freq: Once | INTRAMUSCULAR | Status: AC
  Administered 2021-09-02: 20 mg via INTRAVENOUS
  Filled 2021-09-02: qty 2

## 2021-09-02 MED ORDER — FENTANYL CITRATE PF 50 MCG/ML IJ SOSY
PREFILLED_SYRINGE | INTRAMUSCULAR | Status: AC
  Filled 2021-09-02: qty 1

## 2021-09-02 MED ORDER — PROSOURCE TF PO LIQD
90.0000 mL | Freq: Two times a day (BID) | ORAL | Status: DC
  Administered 2021-09-02 – 2021-09-05 (×6): 90 mL
  Filled 2021-09-02 (×6): qty 90

## 2021-09-02 MED ORDER — POTASSIUM CHLORIDE 10 MEQ/100ML IV SOLN
10.0000 meq | INTRAVENOUS | Status: AC
  Administered 2021-09-02 (×4): 10 meq via INTRAVENOUS
  Filled 2021-09-02: qty 100

## 2021-09-02 MED ORDER — BUPIVACAINE LIPOSOME 1.3 % IJ SUSP
INTRAMUSCULAR | Status: DC | PRN
  Administered 2021-09-02: 16 mL

## 2021-09-02 MED ORDER — EPHEDRINE SULFATE (PRESSORS) 50 MG/ML IJ SOLN
INTRAMUSCULAR | Status: DC | PRN
  Administered 2021-09-02: 10 mg via INTRAVENOUS
  Administered 2021-09-02: 5 mg via INTRAVENOUS

## 2021-09-02 MED ORDER — METOPROLOL TARTRATE 25 MG PO TABS
25.0000 mg | ORAL_TABLET | Freq: Two times a day (BID) | ORAL | Status: DC
  Administered 2021-09-02 – 2021-09-03 (×3): 25 mg via ORAL
  Filled 2021-09-02 (×3): qty 1

## 2021-09-02 MED ORDER — KETOROLAC TROMETHAMINE 30 MG/ML IJ SOLN
30.0000 mg | Freq: Once | INTRAMUSCULAR | Status: AC
  Administered 2021-09-02: 30 mg via INTRAVENOUS
  Filled 2021-09-02: qty 1

## 2021-09-02 MED ORDER — ROCURONIUM 10MG/ML (10ML) SYRINGE FOR MEDFUSION PUMP - OPTIME
INTRAVENOUS | Status: DC | PRN
Start: 2021-09-02 — End: 2021-09-02
  Administered 2021-09-02: 40 mg via INTRAVENOUS

## 2021-09-02 MED ORDER — LIDOCAINE HCL (CARDIAC) PF 50 MG/5ML IV SOSY
PREFILLED_SYRINGE | INTRAVENOUS | Status: DC | PRN
Start: 2021-09-02 — End: 2021-09-02
  Administered 2021-09-02: 80 mg via INTRAVENOUS

## 2021-09-02 MED ORDER — SODIUM CHLORIDE 0.9 % IR SOLN
Status: DC | PRN
  Administered 2021-09-02: 1000 mL

## 2021-09-02 MED ORDER — PROPOFOL 10 MG/ML IV BOLUS
INTRAVENOUS | Status: DC | PRN
  Administered 2021-09-02: 200 mg via INTRAVENOUS

## 2021-09-02 MED ORDER — OSMOLITE 1.2 CAL PO LIQD
1000.0000 mL | ORAL | Status: DC
  Filled 2021-09-02: qty 1000

## 2021-09-02 MED ORDER — LACTATED RINGERS IV SOLN: INTRAVENOUS | Status: DC

## 2021-09-02 MED ORDER — FENTANYL CITRATE (PF) 250 MCG/5ML IJ SOLN
INTRAMUSCULAR | Status: AC
  Filled 2021-09-02: qty 5

## 2021-09-02 MED ORDER — POTASSIUM CHLORIDE 10 MEQ/100ML IV SOLN: 10.0000 meq | INTRAVENOUS | Status: DC

## 2021-09-02 MED ORDER — WATER FOR IRRIGATION, STERILE IR SOLN
Status: DC | PRN
  Administered 2021-09-02: 500 mL via SURGICAL_CAVITY

## 2021-09-02 MED ORDER — SUGAMMADEX SODIUM 200 MG/2ML IV SOLN
INTRAVENOUS | Status: DC | PRN
Start: 2021-09-02 — End: 2021-09-02
  Administered 2021-09-02: 200 mg via INTRAVENOUS

## 2021-09-02 MED ORDER — MIDAZOLAM HCL 5 MG/5ML IJ SOLN
INTRAMUSCULAR | Status: DC | PRN
Start: 2021-09-02 — End: 2021-09-02
  Administered 2021-09-02: 2 mg via INTRAVENOUS

## 2021-09-02 MED ORDER — AMIODARONE HCL 200 MG PO TABS
200.0000 mg | ORAL_TABLET | Freq: Every day | ORAL | Status: DC
  Administered 2021-09-02 – 2021-09-03 (×2): 200 mg via ORAL
  Filled 2021-09-02 (×2): qty 1

## 2021-09-02 MED ORDER — CEFAZOLIN SODIUM-DEXTROSE 2-4 GM/100ML-% IV SOLN
INTRAVENOUS | Status: AC
  Filled 2021-09-02: qty 100

## 2021-09-02 MED ORDER — POVIDONE-IODINE 10 % EX OINT
TOPICAL_OINTMENT | CUTANEOUS | Status: DC | PRN
  Administered 2021-09-02: 1 via TOPICAL

## 2021-09-02 MED ORDER — OSMOLITE 1.2 CAL PO LIQD
1000.0000 mL | ORAL | Status: DC
  Administered 2021-09-02: 1000 mL
  Filled 2021-09-02: qty 1000

## 2021-09-02 MED ORDER — LACTATED RINGERS IV SOLN: INTRAVENOUS | Status: DC | PRN

## 2021-09-02 MED ORDER — LIDOCAINE HCL (PF) 2 % IJ SOLN
INTRAMUSCULAR | Status: AC
Start: 2021-09-02 — End: ?
  Filled 2021-09-02: qty 5

## 2021-09-02 MED ORDER — POVIDONE-IODINE 10 % EX OINT
TOPICAL_OINTMENT | CUTANEOUS | Status: AC
  Filled 2021-09-02: qty 1

## 2021-09-02 SURGICAL SUPPLY — 38 items
BLADE SURG SZ11 CARB STEEL (BLADE) ×2 IMPLANT
CHLORAPREP W/TINT 26 (MISCELLANEOUS) ×2 IMPLANT
CLOTH BEACON ORANGE TIMEOUT ST (SAFETY) ×2 IMPLANT
COVER LIGHT HANDLE STERIS (MISCELLANEOUS) ×4 IMPLANT
DRSG OPSITE POSTOP 4X8 (GAUZE/BANDAGES/DRESSINGS) ×2 IMPLANT
ELECT REM PT RETURN 9FT ADLT (ELECTROSURGICAL) ×2
ELECTRODE REM PT RTRN 9FT ADLT (ELECTROSURGICAL) IMPLANT
GLOVE SURG LTX SZ6.5 (GLOVE) ×2 IMPLANT
GLOVE SURG LTX SZ8 (GLOVE) ×1 IMPLANT
GLOVE SURG POLYISO LF SZ7 (GLOVE) ×1 IMPLANT
GLOVE SURG POLYISO LF SZ7.5 (GLOVE) ×2 IMPLANT
GLOVE SURG UNDER POLY LF SZ6.5 (GLOVE) ×1 IMPLANT
GLOVE SURG UNDER POLY LF SZ7 (GLOVE) ×7 IMPLANT
GLOVE SURG UNDER POLY LF SZ8.5 (GLOVE) ×1 IMPLANT
GOWN STRL REUS W/TWL LRG LVL3 (GOWN DISPOSABLE) ×7 IMPLANT
GOWN STRL REUS W/TWL XL LVL3 (GOWN DISPOSABLE) ×1 IMPLANT
INST SET MAJOR GENERAL (KITS) ×2 IMPLANT
KIT TURNOVER KIT A (KITS) ×2 IMPLANT
MANIFOLD NEPTUNE II (INSTRUMENTS) ×2 IMPLANT
NDL HYPO 18GX1.5 BLUNT FILL (NEEDLE) ×1 IMPLANT
NDL HYPO 21X1.5 SAFETY (NEEDLE) ×1 IMPLANT
NEEDLE HYPO 18GX1.5 BLUNT FILL (NEEDLE) ×2 IMPLANT
NEEDLE HYPO 21X1.5 SAFETY (NEEDLE) ×2 IMPLANT
NS IRRIG 1000ML POUR BTL (IV SOLUTION) ×3 IMPLANT
PACK MAJOR ABDOMINAL (CUSTOM PROCEDURE TRAY) ×2 IMPLANT
PAD ARMBOARD 7.5X6 YLW CONV (MISCELLANEOUS) ×2 IMPLANT
SET BASIN LINEN APH (SET/KITS/TRAYS/PACK) ×2 IMPLANT
SPONGE DRAIN TRACH 4X4 STRL 2S (GAUZE/BANDAGES/DRESSINGS) ×2 IMPLANT
SPONGE T-LAP 18X18 ~~LOC~~+RFID (SPONGE) ×1 IMPLANT
STAPLER VISISTAT (STAPLE) ×2 IMPLANT
SUT CHROMIC 3 0 SH 27 (SUTURE) ×1 IMPLANT
SUT PDS AB CT VIOLET #0 27IN (SUTURE) ×2 IMPLANT
SUT PROLENE 2 0 FS (SUTURE) ×2 IMPLANT
SUT SILK 3 0 SH CR/8 (SUTURE) ×1 IMPLANT
SYR 10ML LL (SYRINGE) ×2 IMPLANT
SYR 20ML LL LF (SYRINGE) ×4 IMPLANT
TUBE J 18FR (TUBING) ×2 IMPLANT
WATER STERILE IRR 500ML POUR (IV SOLUTION) ×2 IMPLANT

## 2021-09-02 NOTE — Progress Notes (Addendum)
?Progress Note ? ? ?Patient: Jimmy Tucker HBZ:[id number] DOB: 05/15/1961 DOA: 09/14/2021     5 ?DOS: the patient was seen and examined on 09/22/2021 ?  ?Brief hospital admission narrative course ?As per H&P written by Dr. Denton Brick on 09/10/2021 ?Jimmy Tucker is a 61 y.o. male with medical history significant for esophageal cancer, hypertension, coronary artery disease, atrial fibrillation, COPD, alcohol abuse. ?Patient was brought to the ED from home with reports of multiple complaints.  Family-spouse and daughter are present at bedside and assist with the history.  They report confusion today.  But over the past several days, since patient had his esophageal dilatation 2/22, he reports poor oral intake, has barely eaten, neurolysed weakness, patient unable to stand over the past few days, cannot walk.  He reports feeling dizzy a week ago, resulting in a fall twice that day.  Reports a feeling of panic attack today with transient difficulty breathing.  But denies specific palpitations.  He denies chest pain. ?  ?ED Course: Tachycardic, heart rate up to 165 on EKG.  Temperature 97.6.  Respiratory rate 17-32.  Blood pressure systolic initially 101/75.  Sodium 124.  Potassium 3.  Magnesium 1.8.  TSH 4.699.  Troponin 7 > 11 EKG shows atrial fibrillation with RVR.  He was given Cardizem with subsequent drop in blood pressure to 74/64.  1 L bolus was given.  Cardizem discontinued.  ED provider talked to cardiology, okay with amiodarone and heparin drip.  And cardiology team to see in the morning. ? ?Assessment and Plan: ?PAFib-- ?-He has a history of paroxysmal A-fib for which he was on anticoagulation with Xarelto per notes from 2020.   ?-Normal TSH ?-2D echo demonstrating grade 1 diastolic dysfunction; no wall motion abnormalities and preserved ejection fraction. ?-Developed hypotension on IV Cardizem ?Converted to sinus rhythm on IV amiodarone okay to switch to oral  amiodarone 200 mg daily on 09/15/2021 ?-Restart  iv heparin drip on 09/03/21 am for 48 hrs hours and then STOP ?- patient is not a candidate for long-term anticoagulation.   ?--CHADsVASC score 2 ? ?Fall at home, initial encounter ?-B12, ammonia level and TSH within normal limits. ?-Family reporting difficulty sleeping and chronic use of pain medications and benzodiazepines as an outpatient. ?-More coherent, ?- PT eval once medically more stable ? ?Electrolyte abnormalities/-Patient with hyponatremia, hypokalemia, hypomagnesemia and hypophosphatemia ?-Continue to follow electrolytes trend and further replete them as needed ? ?Failure to thrive in adult-in the setting of esophageal stricture ?-Patient with significant quality decline and multiple falls at home in the setting of failure to thrive, deconditioning and poor balance. ?-Chest CTA negative for PE, suggest nonspecific infectious or inflammatory bronchitis.  He is afebrile and without leukocytosis. ?-CT head without acute intracranial abnormalities. ?- Poor p.o. intake secondary to severe esophageal stricture.  Patient is not a candidate for gastrostomy tube placement due to the esophageal stricture and the intrathoracic placement of the stomach ?-As per Dr. Owens Shark from IR not a candidate for IR placement of PEG tube ?-s/p open J-tube placement on 09/01/2021 by Dr Arnoldo Morale--- ?-Start Osmolite 1.2 at 20 ml/hr for now ? ?Esophageal stricture ?-With failure to thrive.  Recurrent issue for patient,, has had over 50 EGDs and dilatation related to esophageal cancer for which he had surgery in the past.  ?--GI input appreciated ?S/p Open J tube on 09/12/2021 as above ? ?Hypertension ?-Soft blood pressure at time of presentation in the setting of arrhythmia and Cardizem drip ?-Now stable. ?-Switch to oral  metoprolol from IV  ?--as needed IV hydralazine. ? ?Stage II pressure injury:  ?-Appreciated in left buttocks present at time of admission ?-Continue constant repositioning  and barrier measures. ? ?Metabolic encephalopathy ?-In the setting of hospital-acquired delirium and most likely mild withdrawal from chronic pain medications and benzos that he normally uses as an outpatient. ?-Mentation is back to baseline, doing okay on Xanax ? ?Acute on chronic anemia--- partly nutritional ?-Hgb is down to 7.2 transfuse 1 unit of PRBC ?-No obvious bleeding noted ? ?Subjective:  ? ?-A bit sleepy after open J-tube procedure ?-No emesis no fevers ? ?Physical Exam: ?Vitals:  ? 09/01/2021 1100 08/29/2021 1305 09/22/2021 1315 08/27/2021 1330  ?BP: (!) 145/75 (!) 163/90 (!) 162/78 (!) 173/75  ?Pulse: 67 71 69 66  ?Resp: 20 (!) '27 19 13  '$ ?Temp: 98.1 ?F (36.7 ?C) 97.7 ?F (36.5 ?C)    ?TempSrc: Oral     ?SpO2:  100% 99% 98%  ?Weight:      ?Height:      ? ? ?Physical Exam ? ?Gen:- Awake Alert, in no acute distress, Frail and emaciated appearing ?HEENT:- [place].AT, No sclera icterus ?Neck-Supple Neck,No JVD,.  ?Lungs-  CTAB , fair air movement bilaterally  ?CV- S1, S2 normal, RRR ?Abd-  +ve B.Sounds, Abd Soft, J tube in situ ?Extremity/Skin:- No  edema,   good pedal pulses  ?Psych-affect is appropriate, oriented x3 ?Neuro-generalized weakness no new focal deficits, no tremors ? ? ?Family Communication: Wife  at bedside. ? ?  Code Status: Full Code ? ? ?Disposition: Awaiting open J tube placement and tolerance of Tube feeding ?Status is: Inpatient ? ?Remains inpatient appropriate because: Requiring IV amiodarone and IV heparin drip, pending  J-tube placement and tolerance of nutrition ?- ? Planned Discharge Destination: Anticipate discharge possibly home with home --in 2 to 3 days ? ? ?Author: ?Roxan Hockey, MD ?09/21/2021 1:44 PM ? ?For on call review [email or web address].  ?

## 2021-09-02 NOTE — OR Nursing (Signed)
Skin assessment,  multiple brusing and small skintears noted to wrist area. And arms.  Opsite applied to skin tear to cover open area.  ?

## 2021-09-02 NOTE — Progress Notes (Addendum)
Nutrition Follow up ? ?DOCUMENTATION CODES:  ? ?Severe malnutrition in context of chronic illness ? ?INTERVENTION:  ?Osmolite 1.2 @ 20 ml/hr via PEJ  ? ?Recommend add-ProSource TF- 90 ml BID per tube ? ?Provides: 736 kcal, 71 gr protein, 394 ml water.  ? ?Risk for refeeding due to inability to sustain nutrition requirements orally. Monitor magnesium, potassium, and phosphorus BID for at least 3 days, MD to replete as needed. ? ?NUTRITION DIAGNOSIS:  ? ?Severe Malnutrition related to chronic illness (esophageal stricture, esophageal adenocarcinoma s/p esophagectomy in 2016. Recurrent strictures, dysphagia.) as evidenced by per patient/family report, severe muscle depletion, severe fat depletion, energy intake < 75% for > or equal to 1 month (BMI-16.4). ? ? ?GOAL:  ?Patient will meet greater than or equal to 90% of their needs ? ? ?MONITOR:  ?TF tolerance, Labs, Weight trends, I & O's ? ?REASON FOR ASSESSMENT:  ?Enteral feeding initiation/management ? ? ?ASSESSMENT: Patient hx of esophageal stricture, esophageal adenocarcinoma s/p esophagectomy in 2016. Recurrent strictures, dysphagia, COPD, HTN, gastric ulcer and chronic ETOH and tobacco use. EGD and balloon dilation 08/17/21.  ? ?Presented to ED on 3/5 with shortness of breath, altered mental status and poor oral intake.  ? ?Family bedside and patient reports little po intake x 2 weeks. Poor dentition. Unable to maintain adequate nutrition orally and surgery consulted to discuss PEG/PEJ tube placement. Discussed patient with nursing. Tube feeding recommendations noted above.  ? ?Significant weight loss-4.7 kg/ 9% < 1 month. Current wt 46.1 kg. ? ?Palliative GOC note reviewed.  ? ?3/10- Patient  is s/p PEJ -18 Fr  placed today. Gradually advance tube feeding due to refeeding risk. Start Osmolite 1.2 @ 20 ml/hr with protein modular 90 ml BID.  ? ?Medications: Thiamine 100 mg daily, Protonix 40 mg BID ? ?IVF-D5%/ 0.9%NaCl 100 ml with KCL @ 50 ml/hr ? ? ?Intake/Output  Summary (Last 24 hours) at 09/15/2021 0838 ?Last data filed at 09/15/2021 0600 ?Gross per 24 hour  ?Intake 1877.97 ml  ?Output 1450 ml  ?Net 427.97 ml  ? ? + 2.3 liters since admission ? ?Labs: Sodium 126 (L), Phosphorus 1.5 (L), Magnesium (1.3 (L), HgB- 8.4 (L).  ?Folate 2.6 (L)-recommend 400 mcg/day to replenish. ? ?BMP Latest Ref Rng & Units 09/19/2021 09/01/2021 09/10/2021  ?Glucose 70 - 99 mg/dL 86 103(H) 120(H)  ?BUN 6 - 20 mg/dL 7 8 <5(L)  ?Creatinine 0.61 - 1.24 mg/dL 0.69 0.67 0.66  ?Sodium 135 - 145 mmol/L 126(L) 128(L) 126(L)  ?Potassium 3.5 - 5.1 mmol/L 3.2(L) 3.5 3.6  ?Chloride 98 - 111 mmol/L 98 98 91(L)  ?CO2 22 - 32 mmol/L '22 24 22  '$ ?Calcium 8.9 - 10.3 mg/dL 8.2(L) 8.4(L) 9.1  ?  ? ?  ? ?Diet Order:   ?Diet Order   ? ?       ?  Diet NPO time specified  Diet effective midnight       ?  ? ?  ?  ? ?  ? ? ?EDUCATION NEEDS:  ?Education needs have been addressed ? ?Skin:  Skin Assessment: Reviewed RN Assessment ? ?Last BM:  unknown ? ?Height:  ? ?Ht Readings from Last 1 Encounters:  ?08/29/21 '5\' 6"'$  (1.676 m)  ? ? ?Weight:  ? ?Wt Readings from Last 1 Encounters:  ?09/19/2021 48.2 kg  ? ? ?Ideal Body Weight:   65 kg ? ?BMI:  Body mass index is 17.15 kg/m?. ? ?Estimated Nutritional Needs:  ? ?Kcal:  1600-1750 ? ?Protein:  82-88 gr ? ?  Fluid:  >1200 ml daily ? ?Colman Cater MS,RD,CSG,LDN ?Contact: AMION ?

## 2021-09-02 NOTE — Op Note (Signed)
Patient:  Jimmy Tucker ? ?DOB:  [birth year] ? ?MRN:  [id number] ? ? ?Preop Diagnosis: History of esophageal cancer, esophageal stricture, poor p.o. intake ? ?Postop Diagnosis: Same ? ?Procedure: Open jejunostomy tube placement ? ?Surgeon: Aviva Signs, MD ? ?Anes: General endotracheal ? ?Indications: Patient is a 61 year old white male who has a longstanding history of an gastroesophageal stricture due to an esophagectomy in the remote past for esophageal cancer.  Patient was not a candidate for either a PEG or IR placement of a PEG.  This procedure is being done for palliation purposes.  The risks and benefits of the procedure including bleeding, infection, and the possibility of bowel injury were fully explained to the patient, who gave informed consent. ? ?Procedure note: The patient was placed in the supine position.  After induction of general endotracheal anesthesia, the abdomen was prepped and draped using the usual sterile technique with ChloraPrep.  Surgical site confirmation was performed. ? ?Midline incision was made from just above the umbilicus to below the umbilicus.  The abdominal cavity was entered into without difficulty.  The small bowel was then run from the ligament of Treitz up to the proximal small bowel.  There were some adhesions of small bowel just above my incision along the midline and in the right upper quadrant.  None of these appeared critical.  I did take down some of the adhesions along the previous surgical incision site.  An incision was made along the left side of the abdominal wall and a jejunostomy tube was then placed.  Just below the area of the jejunal adhesions, an enterotomy was made and a jejunostomy tube was then fed down distally into the small bowel.  A 3-0 Chromic Gut pursestring suture was placed around the entrance of the jejunostomy tube.  The bowel was then used to imbricate the jejunostomy tube in a Witzel like fashion for approximately 5 cm.  The small bowel was  then attached at its exit site of the tube with a 3-0 silk suture x2 along the abdominal wall.  The bolster was then secured at the skin level using a 2-0 Prolene suture x2.  The balloon was not placed into the lumen of the small bowel as the small bowel was not significantly dilated and I was concerned that this would cause an obstruction if it was inflated.  The bowel was returned into the abdominal cavity in orderly fashion.  The fascia was reapproximated using a 0 PDS running suture.  Exparel was instilled into the surrounding wound.  The subcutaneous tissue was irrigated with normal saline.  The skin was closed using staples.  Betadine ointment and dry sterile dressing were applied. ? ?All tape and needle counts were correct at the end of the procedure.  The patient was extubated in the operating room and transferred to PACU in stable condition. ? ?Complications: None ? ?EBL: Minimal ? ?Specimen: None ? ? ?  ?

## 2021-09-02 NOTE — Progress Notes (Signed)
? ?Progress Note ? ?Patient Name: Jimmy Tucker ?Date of Encounter: 08/26/2021 ? ?Primary Cardiologist: [name], MD ? ?Subjective  ? ?No chest pain, palpitations, or shortness of breath this morning. ? ?Inpatient Medications  ?  ?Scheduled Meds: ? sodium chloride   Intravenous Once  ? chlorhexidine  15 mL Mouth Rinse BID  ? Chlorhexidine Gluconate Cloth  6 each Topical Q0600  ? feeding supplement  1 Container Oral TID BM  ? furosemide  20 mg Intravenous Once  ? mouth rinse  15 mL Mouth Rinse q12n4p  ? metoprolol tartrate  2.5 mg Intravenous Q8H  ? pantoprazole (PROTONIX) IV  40 mg Intravenous Q12H  ? thiamine injection  100 mg Intravenous Daily  ? ?Continuous Infusions: ? amiodarone 30 mg/hr (09/01/21 2202)  ?  ceFAZolin (ANCEF) IV    ? dextrose 5 %-0.9% nacl with kcl 50 mL/hr at 09/13/2021 0845  ? potassium chloride    ? ?PRN Meds: ?acetaminophen **OR** acetaminophen, ALPRAZolam, hydrALAZINE, HYDROmorphone (DILAUDID) injection, ipratropium-albuterol, ondansetron **OR** ondansetron (ZOFRAN) IV, polyethylene glycol  ? ?Vital Signs  ?  ?Vitals:  ? 08/31/2021 0400 09/21/2021 0500 09/21/2021 0600 09/06/2021 0700  ?BP: (!) 101/54 120/66 (!) 141/58   ?Pulse: (!) 51 (!) 56 67   ?Resp: '12 13 10   '$ ?Temp:    97.8 ?F (36.6 ?C)  ?TempSrc:    Oral  ?SpO2: 100% 100% 100%   ?Weight:  48.2 kg    ?Height:      ? ? ?Intake/Output Summary (Last 24 hours) at 08/25/2021 0910 ?Last data filed at 08/27/2021 0600 ?Gross per 24 hour  ?Intake 1877.97 ml  ?Output 1450 ml  ?Net 427.97 ml  ? ?Filed Weights  ? 08/31/21 0500 09/01/21 0300 09/04/2021 0500  ?Weight: 46.1 kg 48.9 kg 48.2 kg  ? ? ?Telemetry  ?  ?Sinus rhythm. Personally reviewed. ? ?ECG  ?  ?An ECG dated 09/10/2021 was personally reviewed today and demonstrated:  Sinus tachycardia, diffuse ST-T wave abnormality. ? ?Physical Exam  ? ?GEN: No acute distress.   ?Neck: No JVD. ?Cardiac: RRR, no murmur or gallop.  ?Respiratory: Nonlabored.  Clear to auscultation anteriorly. ?GI: Soft, nontender,  bowel sounds present. ?MS: No pitting edema, cachectic. ? ?Labs  ?  ?Chemistry ?Recent Labs  ?Lab 08/29/2021 ?1615 08/29/21 ?1002 09/10/2021 ?0427 09/01/21 ?6568 09/17/2021 ?1275  ?NA 124*   < > 126* 128* 126*  ?K 3.0*   < > 3.6 3.5 3.2*  ?CL 84*   < > 91* 98 98  ?CO2 23   < > '22 24 22  '$ ?GLUCOSE 116*   < > 120* 103* 86  ?BUN 10   < > <5* 8 7  ?CREATININE 0.64   < > 0.66 0.67 0.69  ?CALCIUM 9.8   < > 9.1 8.4* 8.2*  ?PROT 7.9  --   --   --  4.8*  ?ALBUMIN 3.5  --   --  2.4* 2.2*  ?AST 23  --   --   --  20  ?ALT 14  --   --   --  10  ?ALKPHOS 57  --   --   --  36*  ?BILITOT 3.0*  --   --   --  0.5  ?GFRNONAA >60   < > >60 >60 >60  ?ANIONGAP 17*   < > '13 6 6  '$ ? < > = values in this interval not displayed.  ?  ? ?Hematology ?Recent Labs  ?Lab 09/01/21 ?0314 09/01/21 ?2315  09/21/2021 ?1117  ?WBC 5.4 4.7 3.7*  ?RBC 2.40* 2.30* 2.19*  ?HGB 8.1* 7.6* 7.2*  ?HCT 22.9* 22.3* 20.8*  ?MCV 95.4 97.0 95.0  ?MCH 33.8 33.0 32.9  ?MCHC 35.4 34.1 34.6  ?RDW 12.8 13.0 13.1  ?PLT 114* 115* 120*  ? ? ?Cardiac Enzymes ?Recent Labs  ?Lab 09/23/2021 ?1615 09/15/2021 ?3567  ?TROPONINIHS 7 11  ? ? ?BNP ?Recent Labs  ?Lab 09/10/2021 ?1625  ?BNP 105.0*  ?  ? ?DDimer ?Recent Labs  ?Lab 09/16/2021 ?1615  ?DDIMER 1.27*  ?  ? ?Radiology  ?  ?No results found. ? ? ?Assessment & Plan  ?  ?1.  Paroxysmal atrial fibrillation, maintaining sinus rhythm on IV amiodarone and IV heparin at this point.  CHA2DS2-VASc score 1.  He remains NPO. ? ?2.  History of esophageal adenocarcinoma status post esophagectomy in 2016 with subsequent recurrent strictures and multiple dilatations, persistent dysphagia.  Felt to be suboptimal for PEG tube placement, seen by Dr. Arnoldo Morale with plan for J-tube placement today. ? ?3.  History of moderate nonobstructive CAD and PAD. ? ?Continue current IV cardiac medications including Lasix, Lopressor, amiodarone, and Heparin (off for procedure). Can eventually convert to oral regimen. Do not plan on long term anticoagulation. ? ?Signed, ?Rozann Lesches, MD  ?09/15/2021, 9:10 AM    ?

## 2021-09-02 NOTE — Anesthesia Procedure Notes (Signed)
Procedure Name: Intubation ?Date/Time: 09/22/2021 11:53 AM ?Performed by: Ollen Bowl, CRNA ?Pre-anesthesia Checklist: Patient identified, Patient being monitored, Timeout performed, Emergency Drugs available and Suction available ?Patient Re-evaluated:Patient Re-evaluated prior to induction ?Oxygen Delivery Method: Circle system utilized ?Preoxygenation: Pre-oxygenation with 100% oxygen ?Induction Type: IV induction ?Ventilation: Mask ventilation without difficulty ?Laryngoscope Size: Mac and 3 ?Grade View: Grade I ?Tube type: Oral ?Tube size: 7.0 mm ?Number of attempts: 1 ?Airway Equipment and Method: Stylet ?Placement Confirmation: ETT inserted through vocal cords under direct vision, positive ETCO2 and breath sounds checked- equal and bilateral ?Secured at: 21 cm ?Tube secured with: Tape ?Dental Injury: Teeth and Oropharynx as per pre-operative assessment  ? ? ? ? ?

## 2021-09-02 NOTE — Interval H&P Note (Signed)
History and Physical Interval Note: ? ?09/01/2021 ?10:22 AM ? ?Jimmy Tucker  has presented today for surgery, with the diagnosis of malnutrition, esophageal stricture.  The various methods of treatment have been discussed with the patient and family. After consideration of risks, benefits and other options for treatment, the patient has consented to  Procedure(s): ?JEJUNOSTOMY (N/A) as a surgical intervention.  The patient's history has been reviewed, patient examined, no change in status, stable for surgery.  I have reviewed the patient's chart and labs.  Questions were answered to the patient's satisfaction.   ? ? ?Aviva Signs ? ? ?

## 2021-09-02 NOTE — Anesthesia Preprocedure Evaluation (Signed)
Anesthesia Evaluation  ?Patient identified by MRN, date of birth, ID band ?Patient awake ? ? ? ?Reviewed: ?Allergy & Precautions, H&P , NPO status , Patient's Chart, lab work & pertinent test results, reviewed documented beta blocker date and time  ? ?Airway ?Mallampati: II ? ?TM Distance: >3 FB ?Neck ROM: full ? ? ? Dental ?no notable dental hx. ? ?  ?Pulmonary ?COPD, Current Smoker and Patient abstained from smoking.,  ?  ?Pulmonary exam normal ?breath sounds clear to auscultation ? ? ? ? ? ? Cardiovascular ?Exercise Tolerance: Good ?hypertension, + Peripheral Vascular Disease  ? ?Rhythm:regular Rate:Normal ? ? ?  ?Neuro/Psych ?Anxiety negative neurological ROS ? negative psych ROS  ? GI/Hepatic ?negative GI ROS, Neg liver ROS,   ?Endo/Other  ?Hypothyroidism  ? Renal/GU ?negative Renal ROS  ?negative genitourinary ?  ?Musculoskeletal ? ? Abdominal ?  ?Peds ? Hematology ? ?(+) Blood dyscrasia, anemia ,   ?Anesthesia Other Findings ?Severe esophageal stricture, unable to dilate ?Anemia - start PRBCs prior to induction ?Severe malnutrition ? Reproductive/Obstetrics ?negative OB ROS ? ?  ? ? ? ? ? ? ? ? ? ? ? ? ? ?  ?  ? ? ? ? ? ? ? ? ?Anesthesia Physical ?Anesthesia Plan ? ?ASA: 4 and emergent ? ?Anesthesia Plan: General and General ETT  ? ?Post-op Pain Management:   ? ?Induction:  ? ?PONV Risk Score and Plan: Ondansetron ? ?Airway Management Planned:  ? ?Additional Equipment:  ? ?Intra-op Plan:  ? ?Post-operative Plan:  ? ?Informed Consent: I have reviewed the patients History and Physical, chart, labs and discussed the procedure including the risks, benefits and alternatives for the proposed anesthesia with the patient or authorized representative who has indicated his/her understanding and acceptance.  ? ? ? ?Dental Advisory Given ? ?Plan Discussed with: CRNA ? ?Anesthesia Plan Comments:   ? ? ? ? ? ? ?Anesthesia Quick Evaluation ? ?

## 2021-09-02 NOTE — Transfer of Care (Signed)
Immediate Anesthesia Transfer of Care Note ? ?Patient: Jimmy Tucker ? ?Procedure(s) Performed: JEJUNOSTOMY (Abdomen) ? ?Patient Location: PACU ? ?Anesthesia Type:General ? ?Level of Consciousness: awake ? ?Airway & Oxygen Therapy: Patient Spontanous Breathing ? ?Post-op Assessment: Report given to RN ? ?Post vital signs: Reviewed and stable ? ?Last Vitals:  ?Vitals Value Taken Time  ?BP 163/90 09/04/2021 1307  ?Temp    ?Pulse 71 09/19/2021 1309  ?Resp 27 09/07/2021 1309  ?SpO2 100 % 08/26/2021 1309  ?Vitals shown include unvalidated device data. ? ?Last Pain:  ?Vitals:  ? 09/06/2021 1100  ?TempSrc: Oral  ?PainSc:   ?   ? ?Patients Stated Pain Goal: 7 (09/06/2021 1010) ? ?Complications: No notable events documented. ?

## 2021-09-03 ENCOUNTER — Inpatient Hospital Stay: Payer: Self-pay

## 2021-09-03 ENCOUNTER — Inpatient Hospital Stay (HOSPITAL_COMMUNITY): Payer: Medicare Other

## 2021-09-03 LAB — RENAL FUNCTION PANEL
Albumin: 2.1 g/dL — ABNORMAL LOW (ref 3.5–5.0)
Anion gap: 7 (ref 5–15)
BUN: 10 mg/dL (ref 6–20)
CO2: 24 mmol/L (ref 22–32)
Calcium: 8.4 mg/dL — ABNORMAL LOW (ref 8.9–10.3)
Chloride: 98 mmol/L (ref 98–111)
Creatinine, Ser: 0.79 mg/dL (ref 0.61–1.24)
GFR, Estimated: >60 mL/min (ref >60)
Glucose, Bld: 109 mg/dL — ABNORMAL HIGH (ref 70–99)
Phosphorus: 3.3 mg/dL (ref 2.5–4.6)
Potassium: 3.6 mmol/L (ref 3.5–5.1)
Sodium: 129 mmol/L — ABNORMAL LOW (ref 135–145)

## 2021-09-03 LAB — CBC
HCT: 27.3 % — ABNORMAL LOW (ref 39.0–52.0)
Hemoglobin: 9.7 g/dL — ABNORMAL LOW (ref 13.0–17.0)
MCH: 33.8 pg (ref 26.0–34.0)
MCHC: 35.5 g/dL (ref 30.0–36.0)
MCV: 95.1 fL (ref 80.0–100.0)
Platelets: 111 10*3/uL — ABNORMAL LOW (ref 150–400)
RBC: 2.87 MIL/uL — ABNORMAL LOW (ref 4.22–5.81)
RDW: 13.2 % (ref 11.5–15.5)
WBC: 7 10*3/uL (ref 4.0–10.5)
nRBC: 0 % (ref 0.0–0.2)

## 2021-09-03 LAB — BLOOD GAS, ARTERIAL
Acid-base deficit: 3.6 mmol/L — ABNORMAL HIGH (ref 0.0–2.0)
Bicarbonate: 20.6 mmol/L (ref 20.0–28.0)
Drawn by: 22179
FIO2: 50 %
O2 Saturation: 100 %
Patient temperature: 36.6
pCO2 arterial: 33 mmHg (ref 32–48)
pH, Arterial: 7.4 (ref 7.35–7.45)
pO2, Arterial: 101 mmHg (ref 83–108)

## 2021-09-03 LAB — MAGNESIUM: Magnesium: 1.4 mg/dL — ABNORMAL LOW (ref 1.7–2.4)

## 2021-09-03 LAB — HEPARIN LEVEL (UNFRACTIONATED)
Heparin Unfractionated: <0.1 IU/mL — ABNORMAL LOW (ref 0.30–0.70)
Heparin Unfractionated: <0.1 IU/mL — ABNORMAL LOW (ref 0.30–0.70)

## 2021-09-03 LAB — GLUCOSE, CAPILLARY
Glucose-Capillary: 113 mg/dL — ABNORMAL HIGH (ref 70–99)
Glucose-Capillary: 163 mg/dL — ABNORMAL HIGH (ref 70–99)

## 2021-09-03 MED ORDER — SUCCINYLCHOLINE CHLORIDE 200 MG/10ML IV SOSY
PREFILLED_SYRINGE | INTRAVENOUS | Status: AC
  Filled 2021-09-03: qty 10

## 2021-09-03 MED ORDER — ETOMIDATE 2 MG/ML IV SOLN
INTRAVENOUS | Status: AC
  Filled 2021-09-03: qty 20

## 2021-09-03 MED ORDER — MIDAZOLAM HCL 2 MG/2ML IJ SOLN: 2.0000 mg | INTRAMUSCULAR | Status: DC | PRN

## 2021-09-03 MED ORDER — FENTANYL CITRATE PF 50 MCG/ML IJ SOSY
50.0000 ug | PREFILLED_SYRINGE | INTRAMUSCULAR | Status: DC | PRN
  Administered 2021-09-04: 200 ug via INTRAVENOUS
  Administered 2021-09-04: 100 ug via INTRAVENOUS
  Filled 2021-09-03: qty 4
  Filled 2021-09-03: qty 2

## 2021-09-03 MED ORDER — PROPOFOL 1000 MG/100ML IV EMUL
INTRAVENOUS | Status: AC
  Filled 2021-09-03: qty 100

## 2021-09-03 MED ORDER — DOCUSATE SODIUM 50 MG/5ML PO LIQD
100.0000 mg | Freq: Two times a day (BID) | ORAL | Status: DC
  Administered 2021-09-03 – 2021-09-05 (×4): 100 mg
  Filled 2021-09-03 (×4): qty 10

## 2021-09-03 MED ORDER — ORAL CARE MOUTH RINSE
15.0000 mL | OROMUCOSAL | Status: DC
  Administered 2021-09-03 – 2021-09-05 (×18): 15 mL via OROMUCOSAL

## 2021-09-03 MED ORDER — ASCORBIC ACID 500 MG PO TABS
500.0000 mg | ORAL_TABLET | Freq: Every day | ORAL | Status: DC
  Administered 2021-09-03: 500 mg via ORAL
  Filled 2021-09-03: qty 1

## 2021-09-03 MED ORDER — ONDANSETRON HCL 4 MG/2ML IJ SOLN
4.0000 mg | Freq: Four times a day (QID) | INTRAMUSCULAR | Status: DC | PRN
Start: 2021-09-03 — End: 2021-09-05

## 2021-09-03 MED ORDER — METOPROLOL TARTRATE 25 MG PO TABS
25.0000 mg | ORAL_TABLET | Freq: Two times a day (BID) | ORAL | Status: DC
  Administered 2021-09-03 – 2021-09-04 (×2): 25 mg
  Filled 2021-09-03 (×2): qty 1

## 2021-09-03 MED ORDER — KETAMINE HCL 50 MG/5ML IJ SOSY
PREFILLED_SYRINGE | INTRAMUSCULAR | Status: AC
  Filled 2021-09-03: qty 5

## 2021-09-03 MED ORDER — HEPARIN (PORCINE) 25000 UT/250ML-% IV SOLN
1300.0000 [IU]/h | INTRAVENOUS | Status: DC
  Administered 2021-09-03: 1000 [IU]/h via INTRAVENOUS
  Administered 2021-09-04: 1300 [IU]/h via INTRAVENOUS
  Filled 2021-09-03 (×2): qty 250

## 2021-09-03 MED ORDER — FENTANYL CITRATE PF 50 MCG/ML IJ SOSY
PREFILLED_SYRINGE | INTRAMUSCULAR | Status: AC
  Filled 2021-09-03: qty 2

## 2021-09-03 MED ORDER — ALPRAZOLAM 0.5 MG PO TABS: 0.5000 mg | ORAL_TABLET | Freq: Two times a day (BID) | ORAL | Status: DC | PRN

## 2021-09-03 MED ORDER — CHLORHEXIDINE GLUCONATE 0.12% ORAL RINSE (MEDLINE KIT)
15.0000 mL | Freq: Two times a day (BID) | OROMUCOSAL | Status: DC
  Administered 2021-09-03 – 2021-09-05 (×4): 15 mL via OROMUCOSAL

## 2021-09-03 MED ORDER — ASCORBIC ACID 500 MG PO TABS
500.0000 mg | ORAL_TABLET | Freq: Every day | ORAL | Status: DC
  Administered 2021-09-04 – 2021-09-05 (×2): 500 mg
  Filled 2021-09-03 (×2): qty 1

## 2021-09-03 MED ORDER — SODIUM CHLORIDE 0.9 % IV SOLN
250.0000 mL | INTRAVENOUS | Status: DC
  Administered 2021-09-03: 250 mL via INTRAVENOUS

## 2021-09-03 MED ORDER — MIDAZOLAM HCL 2 MG/2ML IJ SOLN
INTRAMUSCULAR | Status: AC
  Filled 2021-09-03: qty 2

## 2021-09-03 MED ORDER — MAGNESIUM SULFATE 2 GM/50ML IV SOLN
2.0000 g | Freq: Once | INTRAVENOUS | Status: AC
  Administered 2021-09-03: 2 g via INTRAVENOUS
  Filled 2021-09-03: qty 50

## 2021-09-03 MED ORDER — OSMOLITE 1.2 CAL PO LIQD
1000.0000 mL | ORAL | Status: DC
  Filled 2021-09-03: qty 1000

## 2021-09-03 MED ORDER — SODIUM CHLORIDE 0.9 % IV BOLUS
500.0000 mL | Freq: Once | INTRAVENOUS | Status: AC
  Administered 2021-09-03: 500 mL via INTRAVENOUS

## 2021-09-03 MED ORDER — NOREPINEPHRINE 4 MG/250ML-% IV SOLN
2.0000 ug/min | INTRAVENOUS | Status: DC
  Administered 2021-09-03: 10 ug/min via INTRAVENOUS
  Administered 2021-09-04: 40 ug/min via INTRAVENOUS
  Administered 2021-09-04: 15 ug/min via INTRAVENOUS
  Administered 2021-09-04: 28 ug/min via INTRAVENOUS
  Administered 2021-09-05: 25 ug/min via INTRAVENOUS
  Administered 2021-09-05 (×2): 30 ug/min via INTRAVENOUS
  Filled 2021-09-03 (×3): qty 250
  Filled 2021-09-03: qty 1000
  Filled 2021-09-03: qty 500
  Filled 2021-09-03 (×2): qty 250

## 2021-09-03 MED ORDER — ONDANSETRON HCL 4 MG PO TABS: 4.0000 mg | ORAL_TABLET | Freq: Four times a day (QID) | ORAL | Status: DC | PRN

## 2021-09-03 MED ORDER — ACETAMINOPHEN 650 MG RE SUPP
650.0000 mg | Freq: Four times a day (QID) | RECTAL | Status: DC | PRN
  Administered 2021-09-04: 650 mg via RECTAL
  Filled 2021-09-03: qty 1

## 2021-09-03 MED ORDER — MIDAZOLAM-SODIUM CHLORIDE 100-0.9 MG/100ML-% IV SOLN
INTRAVENOUS | Status: AC
  Administered 2021-09-03: 2 mg/h
  Filled 2021-09-03: qty 100

## 2021-09-03 MED ORDER — FENTANYL CITRATE (PF) 100 MCG/2ML IJ SOLN
INTRAMUSCULAR | Status: AC
  Filled 2021-09-03: qty 2

## 2021-09-03 MED ORDER — POLYETHYLENE GLYCOL 3350 17 G PO PACK: 17.0000 g | PACK | Freq: Every day | ORAL | Status: DC | PRN

## 2021-09-03 MED ORDER — ROCURONIUM BROMIDE 10 MG/ML (PF) SYRINGE
PREFILLED_SYRINGE | INTRAVENOUS | Status: AC
  Filled 2021-09-03: qty 10

## 2021-09-03 MED ORDER — ACETAMINOPHEN 325 MG PO TABS: 650.0000 mg | ORAL_TABLET | Freq: Four times a day (QID) | ORAL | Status: DC | PRN

## 2021-09-03 MED ORDER — AMIODARONE HCL 200 MG PO TABS
200.0000 mg | ORAL_TABLET | Freq: Every day | ORAL | Status: DC
  Administered 2021-09-04 – 2021-09-05 (×2): 200 mg
  Filled 2021-09-03 (×2): qty 1

## 2021-09-03 MED ORDER — FENTANYL CITRATE PF 50 MCG/ML IJ SOSY
50.0000 ug | PREFILLED_SYRINGE | INTRAMUSCULAR | Status: AC | PRN
  Administered 2021-09-04 (×3): 50 ug via INTRAVENOUS
  Filled 2021-09-03 (×3): qty 1

## 2021-09-03 MED ORDER — IPRATROPIUM-ALBUTEROL 0.5-2.5 (3) MG/3ML IN SOLN
3.0000 mL | Freq: Four times a day (QID) | RESPIRATORY_TRACT | Status: DC
Start: 2021-09-04 — End: 2021-09-05
  Administered 2021-09-04 – 2021-09-05 (×5): 3 mL via RESPIRATORY_TRACT
  Filled 2021-09-03 (×5): qty 3

## 2021-09-03 MED ORDER — POLYETHYLENE GLYCOL 3350 17 G PO PACK
17.0000 g | PACK | Freq: Every day | ORAL | Status: DC
  Administered 2021-09-03 – 2021-09-05 (×3): 17 g
  Filled 2021-09-03 (×3): qty 1

## 2021-09-03 MED ORDER — MIDAZOLAM-SODIUM CHLORIDE 100-0.9 MG/100ML-% IV SOLN
0.5000 mg/h | INTRAVENOUS | Status: DC
  Administered 2021-09-03: 8 mg/h via INTRAVENOUS
  Administered 2021-09-04: 10 mg/h via INTRAVENOUS
  Filled 2021-09-03 (×2): qty 100

## 2021-09-03 MED ORDER — SODIUM CHLORIDE 0.9 % IV SOLN
3.0000 g | Freq: Four times a day (QID) | INTRAVENOUS | Status: DC
  Administered 2021-09-03 – 2021-09-05 (×7): 3 g via INTRAVENOUS
  Filled 2021-09-03 (×15): qty 8

## 2021-09-03 MED ORDER — IPRATROPIUM-ALBUTEROL 20-100 MCG/ACT IN AERS: 1.0000 | INHALATION_SPRAY | Freq: Four times a day (QID) | RESPIRATORY_TRACT | Status: DC

## 2021-09-03 NOTE — Progress Notes (Signed)
Pharmacy Antibiotic Note ? ?Jimmy Tucker is a 61 y.o. male admitted on 09/04/2021 with  aspiration pneumonia .  Pharmacy has been consulted for Unasyn dosing. ? ?Plan: ?Unasyn 3000 mg IV every 6 hours. ?Monitor labs, c/s, and patient improvement. ? ?Height: '5\' 6"'$  (167.6 cm) ?Weight: 49.6 kg (109 lb 5.6 oz) ?IBW/kg (Calculated) : 63.8 ? ?Temp (24hrs), Avg:97.4 ?F (36.3 ?C), Min:96.5 ?F (35.8 ?C), Max:97.9 ?F (36.6 ?C) ? ?Recent Labs  ?Lab 08/29/21 ?1002 08/31/2021 ?0427 08/31/21 ?2694 09/01/21 ?8546 09/01/21 ?2315 08/31/2021 ?2703 09/03/21 ?5009  ?WBC 5.2 4.9 7.6 5.4 4.7 3.7* 7.0  ?CREATININE 0.57* 0.66  --  0.67  --  0.69 0.79  ?  ?Estimated Creatinine Clearance: 68.9 mL/min (by C-G formula based on SCr of 0.79 mg/dL).   ? ?Allergies  ?Allergen Reactions  ? Drug [Tape]   ?  PLEASE USE COBAN ,,,, ADHESIVE TAPE TEARS SKIN ?  ? Glycopyrrolate Other (See Comments)  ?  Intolerance--unknown reaction  ? Warfarin And Related Other (See Comments)  ?  Excessive bleeding  ? ? ?Antimicrobials this admission: ?Unasyn 3/11 >>\ ? ?Microbiology results: ?MRSA PCR: neg ? ?Thank you for allowing pharmacy to be a part of this patient?s care. ? ?Ramond Craver ?09/03/2021 4:50 PM ? ?

## 2021-09-03 NOTE — Progress Notes (Signed)
1 Day Post-Op  ?Subjective: ?Patient has minimal incisional pain. ? ?Objective: ?Vital signs in last 24 hours: ?Temp:  [96 ?F (35.6 ?C)-98.1 ?F (36.7 ?C)] 96.5 ?F (35.8 ?C) (03/10 1931) ?Pulse Rate:  [57-75] 74 (03/11 0600) ?Resp:  [12-27] 18 (03/11 0600) ?BP: (80-173)/(43-90) 132/63 (03/11 0600) ?SpO2:  [90 %-100 %] 95 % (03/11 0600) ?Weight:  [49.6 kg-49.9 kg] 49.6 kg (03/11 0600) ?Last BM Date :  (PTA) ? ?Intake/Output from previous day: ?03/10 0701 - 03/11 0700 ?In: 2641.1 [P.O.:150; I.V.:534.6; Blood:315; NG/GT:241.3; IV Piggyback:1280.3] ?Out: 1220 [Urine:1200; Blood:20] ?Intake/Output this shift: ?No intake/output data recorded. ? ?General appearance: alert, cooperative, and no distress ?GI: Soft, flat.  Incision healing well.  Bowel sounds present.  Jejunostomy tube in appropriate position. ? ?Lab Results:  ?Recent Labs  ?  08/29/2021 ?0412 09/03/21 ?0412  ?WBC 3.7* 7.0  ?HGB 7.2* 9.7*  ?HCT 20.8* 27.3*  ?PLT 120* 111*  ? ?BMET ?Recent Labs  ?  09/10/2021 ?0412 09/03/21 ?0412  ?NA 126* 129*  ?K 3.2* 3.6  ?CL 98 98  ?CO2 22 24  ?GLUCOSE 86 109*  ?BUN 7 10  ?CREATININE 0.69 0.79  ?CALCIUM 8.2* 8.4*  ? ?PT/INR ?No results for input(s): LABPROT, INR in the last 72 hours. ? ?Studies/Results: ?No results found. ? ?Anti-infectives: ?Anti-infectives (From admission, onward)  ? ? Start     Dose/Rate Route Frequency Ordered Stop  ? 08/31/2021 1002  ceFAZolin (ANCEF) 2-4 GM/100ML-% IVPB       ?Note to Pharmacy: Abbie Sons S: cabinet override  ?    08/26/2021 1002 08/29/2021 1204  ? 09/10/2021 0600  ceFAZolin (ANCEF) IVPB 2g/100 mL premix       ? 2 g ?200 mL/hr over 30 Minutes Intravenous On call to O.R. 09/01/21 2225 09/11/2021 1200  ? ?  ? ? ?Assessment/Plan: ?s/p Procedure(s): ?JEJUNOSTOMY ?Impression: Stable on postoperative day 1.  May advance jejunostomy tube feedings as per nutrition recommendations.  Currently is on 20 cc an hour.  May restart heparin. ? LOS: 6 days  ? ? ?Aviva Signs ?09/03/2021  ?

## 2021-09-03 NOTE — Progress Notes (Addendum)
Nutrition Follow up ? ?DOCUMENTATION CODES:  ? ?Severe malnutrition in context of chronic illness ? ?INTERVENTION:  ?Advance Osmolite 1.2 @ 30 ml/hr via PEJ  ? ?Continue ProSource TF- 90 ml BID per tube ? ?Provides: 1024 kcal, 84 gr protein, 590 ml water.  ? ?Vitamin C - 500 mg daily ? ?Risk for refeeding due to inability to sustain nutrition requirements orally. Monitor magnesium, potassium, and phosphorus BID for at least 3 days, MD to replete as needed. ? ?Continue Boost Breeze TID  ? ?NUTRITION DIAGNOSIS:  ? ?Severe Malnutrition related to chronic illness (esophageal stricture, esophageal adenocarcinoma s/p esophagectomy in 2016. Recurrent strictures, dysphagia.) as evidenced by per patient/family report, severe muscle depletion, severe fat depletion, energy intake < 75% for > or equal to 1 month (BMI-16.4). ? ?-progressing with placement of PEJ and initiation of enteral feeding ? ?GOAL:  ?Patient will meet greater than or equal to 90% of their needs ? ?-Advancing gradually to goal due to patient refeeding risk ? ?MONITOR:  ?TF tolerance, Labs, Weight trends, I & O's ? ?REASON FOR ASSESSMENT:  ?Enteral feeding initiation/management ? ? ?ASSESSMENT: Patient hx of esophageal stricture, esophageal adenocarcinoma s/p esophagectomy in 2016. Recurrent strictures, dysphagia, COPD, HTN, gastric ulcer and chronic ETOH and tobacco use. EGD and balloon dilation 08/17/21.  ? ?Presented to ED on 3/5 with shortness of breath, altered mental status and poor oral intake.  ? ?Family bedside and patient reports little po intake x 2 weeks. Poor dentition. Unable to maintain adequate nutrition orally and surgery consulted to discuss PEG/PEJ tube placement. Discussed patient with nursing. Tube feeding recommendations noted above.  ? ?Significant weight loss-4.7 kg/ 9% < 1 month. Current wt 46.1 kg. ? ?Palliative GOC note reviewed.  ? ?3/10- Patient  is s/p PEJ -18 Fr  placed today. Gradually advance tube feeding due to refeeding  risk. Start Osmolite 1.2 @ 20 ml/hr with protein modular 90 ml BID.  ? ?3/11 Talked with nursing. Pt tolerating tube feeding overnight. MAP-50 this morning nursing to reassess- Tube feeding rate advanced per MD. Osm 1.2 @ 30 ml/hr and protein modular continued at current rate. Magnesium 1.4 (L) - to be replenished  per MD.   ?Vitamin C-<0.1 (L). Add 500 mg daily ascorbic acid.  ?Clear liquid diet for comfort. Last BM unknown- recommend consider bowel supplement.  ? ?Medications: Thiamine 100 mg daily, Protonix 40 mg BID ? ?IVF-D5%/ 0.9%NaCl 1000 ml with KCL @ 30 ml/hr ? ? ?Intake/Output Summary (Last 24 hours) at 09/03/2021 0815 ?Last data filed at 09/03/2021 0300 ?Gross per 24 hour  ?Intake 2641.14 ml  ?Output 1220 ml  ?Net 1421.14 ml  ? ?  ?Labs: Sodium 129 (L), Phosphorus 3.3-wnl, Magnesium 1.4 (L), HgB- 8.4 (L).  ?Folate 2.6 (L)-recommend 400 mcg/day to replenish.  ?Vitamin C <0.1 (L). Vit D 72.06 (wnl), B-12-766 wnl. ? ?BMP Latest Ref Rng & Units 09/03/2021 09/10/2021 09/01/2021  ?Glucose 70 - 99 mg/dL 109(H) 86 103(H)  ?BUN 6 - 20 mg/dL '10 7 8  '$ ?Creatinine 0.61 - 1.24 mg/dL 0.79 0.69 0.67  ?Sodium 135 - 145 mmol/L 129(L) 126(L) 128(L)  ?Potassium 3.5 - 5.1 mmol/L 3.6 3.2(L) 3.5  ?Chloride 98 - 111 mmol/L 98 98 98  ?CO2 22 - 32 mmol/L '24 22 24  '$ ?Calcium 8.9 - 10.3 mg/dL 8.4(L) 8.2(L) 8.4(L)  ?  ? ?  ? ?Diet Order:   ?Diet Order   ? ?       ?  Diet clear liquid Room service appropriate?  Yes; Fluid consistency: Thin  Diet effective now       ?  ? ?  ?  ? ?  ? ? ?EDUCATION NEEDS:  ?Education needs have been addressed ? ?Skin:  Skin Assessment: Reviewed RN Assessment ? ?Last BM:  unknown ? ?Height:  ? ?Ht Readings from Last 1 Encounters:  ?09/03/2021 '5\' 6"'$  (1.676 m)  ? ? ?Weight:  ? ?Wt Readings from Last 1 Encounters:  ?09/03/21 49.6 kg  ? ? ?Ideal Body Weight:   65 kg ? ?BMI:  Body mass index is 17.65 kg/m?. ? ?Estimated Nutritional Needs:  ? ?Kcal:  1600-1750 ? ?Protein:  82-88 gr ? ?Fluid:  >1200 ml daily ? ?Colman Cater MS,RD,CSG,LDN ?Contact: AMION ?

## 2021-09-03 NOTE — Progress Notes (Signed)
Patient respiratory distress. Dr. Arnoldo Morale, Dr. Joesph Fillers at bedside. Patient intubated at 1315 by Dr. Arnoldo Morale. Positive color change, bilateral breath sounds positive. 7 ETT, 24 cm lip. Cxray confirmed placement.  ?

## 2021-09-03 NOTE — Progress Notes (Signed)
?Progress Note ? ? ?Patient: Jimmy Tucker MOQ:[id number] DOB: [birth year] DOA: 08/31/2021     6 ?DOS: the patient was seen and examined on 09/03/2021 ?  ?Brief hospital admission narrative course ?As per H&P written by Dr. Denton Brick on 09/21/2021 ?Toren Tucholski Tucker is a 61 y.o. male with medical history significant for esophageal cancer, hypertension, coronary artery disease, atrial fibrillation, COPD, alcohol abuse. ?Patient was brought to the ED from home with reports of multiple complaints.  Family-spouse and daughter are present at bedside and assist with the history.  They report confusion today.  But over the past several days, since patient had his esophageal dilatation 2/22, he reports poor oral intake, has barely eaten, neurolysed weakness, patient unable to stand over the past few days, cannot walk.  He reports feeling dizzy a week ago, resulting in a fall twice that day.  Reports a feeling of panic attack today with transient difficulty breathing.  But denies specific palpitations.  He denies chest pain. ?  ?ED Course: Tachycardic, heart rate up to 165 on EKG.  Temperature 97.6.  Respiratory rate 17-32.  Blood pressure systolic initially 354/65.  Sodium 124.  Potassium 3.  Magnesium 1.8.  TSH 4.699.  Troponin 7 > 11 EKG shows atrial fibrillation with RVR.  He was given Cardizem with subsequent drop in blood pressure to 74/64.  1 L bolus was given.  Cardizem discontinued.  ED provider talked to cardiology, okay with amiodarone and heparin drip.  And cardiology team to see in the morning. ? ?Assessment and Plan: ?PAFib-- ?-He has a history of paroxysmal A-fib for which he was on anticoagulation with Xarelto per notes from 2020.   ?-Normal TSH ?-2D echo demonstrating grade 1 diastolic dysfunction; no wall motion abnormalities and preserved ejection fraction. ?-Developed hypotension on IV Cardizem ?Converted to sinus rhythm on IV amiodarone okay to switch to oral  amiodarone 200 mg daily on 09/13/2021 ?-Restart  iv heparin drip on 09/03/21 am for 48 hrs hours and then STOP ?- patient is not a candidate for long-term anticoagulation.   ?--CHADsVASC score 2 ? ?Aspiration pneumonia --on 09/03/2021 patient had episode of aspiration--with severe respiratory distress and unresponsiveness ?-Emergently intubated by Dr. Arnoldo Morale ?-Postintubation chest x-ray shows new infiltrates are seen in the lower lung fields, more so on the right side suggesting pneumonia or aspiration. ?-Start IV Unasyn ?-Continue bronchodilators ? ?Fall at home, initial encounter ?-B12, ammonia level and TSH within normal limits. ?-Family reporting difficulty sleeping and chronic use of pain medications and benzodiazepines as an outpatient. ?-More coherent, ?- PT eval once medically more stable ? ?Electrolyte abnormalities/-Patient with hyponatremia, hypokalemia, hypomagnesemia and hypophosphatemia ?-Continue to follow electrolytes trend and further replete them as needed ? ?Failure to thrive in adult-in the setting of esophageal stricture ?-Patient with significant quality decline and multiple falls at home in the setting of failure to thrive, deconditioning and poor balance. ?-Chest CTA negative for PE, suggest nonspecific infectious or inflammatory bronchitis.  He is afebrile and without leukocytosis. ?-CT head without acute intracranial abnormalities. ?- Poor p.o. intake secondary to severe esophageal stricture.  Patient is not a candidate for gastrostomy tube placement due to the esophageal stricture and the intrathoracic placement of the stomach ?-As per Dr. Owens Shark from IR not a candidate for IR placement of PEG tube ?-s/p open J-tube placement on 09/06/2021 by Dr Arnoldo Morale--- ?-c/n Osmolite 1.2 at 30 ml/hr for now ? ?Esophageal stricture ?-With failure to thrive.  Recurrent issue for patient,, has had over 50 EGDs  and dilatation related to esophageal cancer for which he had surgery in the past.  ?--GI input  appreciated ?S/p Open J tube on 09/16/2021 as above ? ?Hypertension ?-Soft blood pressure at time of presentation in the setting of arrhythmia and Cardizem drip ?-Now stable. ?-Switch to oral metoprolol from IV  ?--as needed IV hydralazine. ? ?Stage II pressure injury:  ?-Appreciated in left buttocks present at time of admission ?-Continue constant repositioning and barrier measures. ? ?Metabolic encephalopathy ?-In the setting of hospital-acquired delirium and most likely mild withdrawal from chronic pain medications and benzos that he normally uses as an outpatient. ?-Mentation is back to baseline, doing okay on Xanax ? ?Acute on chronic anemia--- partly nutritional ?-Hgb is down to 7.2 transfused 1 unit of PRBC ?-No obvious bleeding noted ? ?Propofol induced hypotension--- patient required IV Levophed for pressure support ? ?CRITICAL CARE ?Performed by: Roxan Hockey ? ? ?Total critical care time: 43 minutes ? ?Critical care time was exclusive of separately billable procedures and treating other patients. ? ?Critical care was necessary to treat or prevent imminent or life-threatening deterioration. ?-Episode of aspiration with acute hypoxic respiratory distress/respiratory failure requiring intubation and ventilation, currently on Levophed for pressure support ? ?Critical care was time spent personally by me on the following activities: development of treatment plan with patient and/or surrogate as well as nursing, discussions with consultants, evaluation of patient's response to treatment, examination of patient, obtaining history from patient or surrogate, ordering and performing treatments and interventions, ordering and review of laboratory studies, ordering and review of radiographic studies, pulse oximetry and re-evaluation of patient's condition. ? ? ?Subjective:  ?- ?Episode of aspiration--with severe respiratory distress and unresponsiveness ?-Emergently intubated by Dr. Arnoldo Morale ?-Postintubation chest  x-ray shows new infiltrates are seen in the lower lung fields, more so on the ?right side suggesting pneumonia or aspiration. ? ?Physical Exam: ?Vitals:  ? 09/03/21 1745 09/03/21 1800 09/03/21 1815 09/03/21 1830  ?BP: (!) 136/91 (!) 73/38 120/79 (!) 87/46  ?Pulse: 75 73 75 72  ?Resp: '20 20 20 19  '$ ?Temp:      ?TempSrc:      ?SpO2: 100% 100% 100% 99%  ?Weight:      ?Height:      ? ? ?Physical Exam ? ?Gen:-Intubated and sedated ?HEENT:-ET tube  ?Lungs-diminished breath sounds with scattered rhonchi bilaterally ?CV- S1, S2 normal, irregular  ?abd-  +ve B.Sounds, Abd Soft, J tube in situ ?Extremity/Skin:- No  edema,   good pedal pulses  ?Neuro-Psych--intubated and sedated ?MSK-PICC line site ? ?Family Communication: Wife and multiple fam members  at bedside. ? ?  Code Status: Full Code ? ? ?Disposition: -Intubated and sedated ?Status is: Inpatient ? ?Remains inpatient appropriate because: Currently intubated and sedated- ? Planned Discharge Destination: May need rehab ? ? ?Author: ?Roxan Hockey, MD ?09/03/2021 6:41 PM ? ?For on call review [email or web address].  ?

## 2021-09-03 NOTE — Progress Notes (Signed)
ANTICOAGULATION CONSULT NOTE  ? ?Pharmacy Consult for Heparin ?Indication: atrial fibrillation ? ?Allergies  ?Allergen Reactions  ? Drug [Tape]   ?  PLEASE USE COBAN ,,,, ADHESIVE TAPE TEARS SKIN ?  ? Glycopyrrolate Other (See Comments)  ?  Intolerance--unknown reaction  ? Warfarin And Related Other (See Comments)  ?  Excessive bleeding  ? ? ?Patient Measurements: ?Height: '5\' 6"'$  (167.6 cm) ?Weight: 49.6 kg (109 lb 5.6 oz) ?IBW/kg (Calculated) : 63.8 ?HEPARIN DW (KG): 49.9  ? ?Vital Signs: ?BP: 132/63 (03/11 0600) ?Pulse Rate: 74 (03/11 0600) ? ?Labs: ?Recent Labs  ?  09/01/21 ?0314 09/01/21 ?1108 09/01/21 ?2315 08/27/2021 ?8264 09/03/21 ?1583  ?HGB 8.1*  --  7.6* 7.2* 9.7*  ?HCT 22.9*  --  22.3* 20.8* 27.3*  ?PLT 114*  --  115* 120* 111*  ?HEPARINUNFRC 0.47 0.47  --   --  <0.10*  ?CREATININE 0.67  --   --  0.69 0.79  ? ? ? ?Estimated Creatinine Clearance: 68.9 mL/min (by C-G formula based on SCr of 0.79 mg/dL). ? ? ?Medical History: ?Past Medical History:  ?Diagnosis Date  ? Abnormal nuclear stress test   ? Anxiety   ? Cancer [facility])   ? Carotid artery disease ([place])   ? Chronic back pain   ? Chronic lower back pain   ? "L4-5"  ? COPD (chronic obstructive pulmonary disease) ([place])   ? Esophageal cancer ([place])   ? Heavy cigarette smoker (20-39 per day)   ? Unmotivated  ? Hyperlipidemia LDL goal <70   ? Intolerant to statins  ? Lumbar disc disease   ? PAD (peripheral artery disease) ([place]) 10/30/2012  ? a) 2011 LEA Dopplers: Left ATA occlusion; b) 10/2013: LEA Dopplers - L Common Iliac & CFA occlusion - w/ short reconstitution at the branch point of the external and internal iliac, the external iliac is occluded through the common femoral and proximal SFA; reconstitutes in the proximal SFA. Two-vessel runoff beyond.; c) s/p LCIA Stent & CFA EA w/ patch angioplasty --> f/u doppler 12/01/2013 patent  ? Pollen allergies   ? Presence of stent in artery   ? ? ?Assessment: ?Patient presents for altered mental status. Patient with new  onset afib. Not on oral anticoagulants. Pharmacy asked to start heparin. ? ?Restarting heparin today post procedure ? ?Goal of Therapy:  ?Heparin level 0.3-0.7 units/ml ?Monitor platelets by anticoagulation protocol: Yes ?  ?Plan:  ?No bolus ?Restart heparin infusion at 1000 units/hr ?Heparin level in 6-8 hours and daily ?Continue to monitor H&H and platelets. ? ? ? ? ? ?

## 2021-09-03 NOTE — Progress Notes (Signed)
Patient started on propofol after intubation. Blood pressures with MAP lower than 60. Propofol changed from versed. Verbal order from Dr. Denton Brick. Also 1L of NS bolus given. Levophed started. Order for PICC to be placed. [facility] on unit to place PICC. Dr. Denton Brick at bedside speaking with family.  ?

## 2021-09-03 NOTE — Progress Notes (Signed)
ANTICOAGULATION CONSULT NOTE  ? ?Pharmacy Consult for Heparin ?Indication: atrial fibrillation ? ?Allergies  ?Allergen Reactions  ? Drug [Tape]   ?  PLEASE USE COBAN ,,,, ADHESIVE TAPE TEARS SKIN ?  ? Glycopyrrolate Other (See Comments)  ?  Intolerance--unknown reaction  ? Warfarin And Related Other (See Comments)  ?  Excessive bleeding  ? ? ?Patient Measurements: ?Height: '5\' 6"'$  (167.6 cm) ?Weight: 49.6 kg (109 lb 5.6 oz) ?IBW/kg (Calculated) : 63.8 ?HEPARIN DW (KG): 49.9  ? ?Vital Signs: ?Temp: 97.6 ?F (36.4 ?C) (03/11 1608) ?Temp Source: Axillary (03/11 1608) ?BP: 109/67 (03/11 1845) ?Pulse Rate: 72 (03/11 1845) ? ?Labs: ?Recent Labs  ?  09/01/21 ?0314 09/01/21 ?1108 09/01/21 ?2315 09/12/2021 ?5465 09/03/21 ?0354 09/03/21 ?1500  ?HGB 8.1*  --  7.6* 7.2* 9.7*  --   ?HCT 22.9*  --  22.3* 20.8* 27.3*  --   ?PLT 114*  --  115* 120* 111*  --   ?HEPARINUNFRC 0.47 0.47  --   --  <0.10* <0.10*  ?CREATININE 0.67  --   --  0.69 0.79  --   ? ? ? ?Estimated Creatinine Clearance: 68.9 mL/min (by C-G formula based on SCr of 0.79 mg/dL). ? ? ?Medical History: ?Past Medical History:  ?Diagnosis Date  ? Abnormal nuclear stress test   ? Anxiety   ? Cancer [facility])   ? Carotid artery disease ([place])   ? Chronic back pain   ? Chronic lower back pain   ? "L4-5"  ? COPD (chronic obstructive pulmonary disease) ([place])   ? Esophageal cancer ([place])   ? Heavy cigarette smoker (20-39 per day)   ? Unmotivated  ? Hyperlipidemia LDL goal <70   ? Intolerant to statins  ? Lumbar disc disease   ? PAD (peripheral artery disease) ([place]) 10/30/2012  ? a) 2011 LEA Dopplers: Left ATA occlusion; b) 10/2013: LEA Dopplers - L Common Iliac & CFA occlusion - w/ short reconstitution at the branch point of the external and internal iliac, the external iliac is occluded through the common femoral and proximal SFA; reconstitutes in the proximal SFA. Two-vessel runoff beyond.; c) s/p LCIA Stent & CFA EA w/ patch angioplasty --> f/u doppler 12/01/2013 patent  ? Pollen  allergies   ? Presence of stent in artery   ? ? ?Assessment: ?Patient presents for altered mental status. Patient with new onset afib. Not on oral anticoagulants. Pharmacy asked to start heparin. ? ?Restarting heparin today post procedure ?HL <0.10- no issues with heparin per RN ? ?Goal of Therapy:  ?Heparin level 0.3-0.7 units/ml ?Monitor platelets by anticoagulation protocol: Yes ?  ?Plan:  ?No bolus per MD ?Increase heparin infusion to 1150 units/hr ?Heparin level in 6-8 hours and daily ?Continue to monitor H&H and platelets. ? ? ? ? ? ?

## 2021-09-03 NOTE — Progress Notes (Signed)
Spoke with Purcell Nails RN states [facility] is on unit to place the PICC. ?

## 2021-09-03 NOTE — Progress Notes (Signed)
Patient complained of shortness of breath, and difficulty catching his breath. Patient placed on 3L [place] to assist with breathing. Respiratory notified for PRN breathing treatment. Patient noted to cough up what looked similar to prosource medication that was given per G tube earlier this morning. Dr. Joesph Fillers and Dr. Arnoldo Morale notified. Respiratory to bedside to give PRN breathing treatment, patient couldn't tolerate. Patient removed oxygen and sats decreased into the 50s. Sister at bedside calling wife. Patient noted to be pale. Oxygen through nasal cannula increased to 6L [place].  ?

## 2021-09-04 ENCOUNTER — Inpatient Hospital Stay (HOSPITAL_COMMUNITY): Payer: Medicare Other

## 2021-09-04 DIAGNOSIS — K922 Gastrointestinal hemorrhage, unspecified: Secondary | ICD-10-CM

## 2021-09-04 DIAGNOSIS — J9601 Acute respiratory failure with hypoxia: Secondary | ICD-10-CM | POA: Diagnosis not present

## 2021-09-04 LAB — CBC
HCT: 24.7 % — ABNORMAL LOW (ref 39.0–52.0)
Hemoglobin: 8.7 g/dL — ABNORMAL LOW (ref 13.0–17.0)
MCH: 33.5 pg (ref 26.0–34.0)
MCHC: 35.2 g/dL (ref 30.0–36.0)
MCV: 95 fL (ref 80.0–100.0)
Platelets: 113 10*3/uL — ABNORMAL LOW (ref 150–400)
RBC: 2.6 MIL/uL — ABNORMAL LOW (ref 4.22–5.81)
RDW: 13.3 % (ref 11.5–15.5)
WBC: 6.6 10*3/uL (ref 4.0–10.5)
nRBC: 0 % (ref 0.0–0.2)

## 2021-09-04 LAB — COMPREHENSIVE METABOLIC PANEL
ALT: 7 U/L (ref 0–44)
AST: 11 U/L — ABNORMAL LOW (ref 15–41)
Albumin: 1.9 g/dL — ABNORMAL LOW (ref 3.5–5.0)
Alkaline Phosphatase: 36 U/L — ABNORMAL LOW (ref 38–126)
Anion gap: 6 (ref 5–15)
BUN: 14 mg/dL (ref 6–20)
CO2: 22 mmol/L (ref 22–32)
Calcium: 7.7 mg/dL — ABNORMAL LOW (ref 8.9–10.3)
Chloride: 100 mmol/L (ref 98–111)
Creatinine, Ser: 0.62 mg/dL (ref 0.61–1.24)
GFR, Estimated: >60 mL/min (ref >60)
Glucose, Bld: 154 mg/dL — ABNORMAL HIGH (ref 70–99)
Potassium: 3.2 mmol/L — ABNORMAL LOW (ref 3.5–5.1)
Sodium: 128 mmol/L — ABNORMAL LOW (ref 135–145)
Total Bilirubin: 0.9 mg/dL (ref 0.3–1.2)
Total Protein: 4.5 g/dL — ABNORMAL LOW (ref 6.5–8.1)

## 2021-09-04 LAB — TRIGLYCERIDES: Triglycerides: 41 mg/dL (ref <150)

## 2021-09-04 LAB — URINALYSIS, ROUTINE W REFLEX MICROSCOPIC
Bilirubin Urine: NEGATIVE
Glucose, UA: 50 mg/dL — AB
Hgb urine dipstick: NEGATIVE
Ketones, ur: 5 mg/dL — AB
Leukocytes,Ua: NEGATIVE
Nitrite: NEGATIVE
Protein, ur: 30 mg/dL — AB
Specific Gravity, Urine: 1.024 (ref 1.005–1.030)
pH: 5 (ref 5.0–8.0)

## 2021-09-04 LAB — OCCULT BLOOD GASTRIC / DUODENUM (SPECIMEN CUP)
Occult Blood, Gastric: POSITIVE — AB
pH, Gastric: 7

## 2021-09-04 LAB — GLUCOSE, CAPILLARY
Glucose-Capillary: 143 mg/dL — ABNORMAL HIGH (ref 70–99)
Glucose-Capillary: 128 mg/dL — ABNORMAL HIGH (ref 70–99)
Glucose-Capillary: 142 mg/dL — ABNORMAL HIGH (ref 70–99)
Glucose-Capillary: 136 mg/dL — ABNORMAL HIGH (ref 70–99)

## 2021-09-04 LAB — HEMOGLOBIN AND HEMATOCRIT, BLOOD
HCT: 27 % — ABNORMAL LOW (ref 39.0–52.0)
Hemoglobin: 9.4 g/dL — ABNORMAL LOW (ref 13.0–17.0)

## 2021-09-04 LAB — HEPARIN LEVEL (UNFRACTIONATED): Heparin Unfractionated: 0.19 IU/mL — ABNORMAL LOW (ref 0.30–0.70)

## 2021-09-04 LAB — MAGNESIUM: Magnesium: 1.8 mg/dL (ref 1.7–2.4)

## 2021-09-04 MED ORDER — POTASSIUM CHLORIDE 10 MEQ/100ML IV SOLN
10.0000 meq | INTRAVENOUS | Status: AC
  Administered 2021-09-04 (×4): 10 meq via INTRAVENOUS
  Filled 2021-09-04 (×4): qty 100

## 2021-09-04 MED ORDER — VASOPRESSIN 20 UNITS/100 ML INFUSION FOR SHOCK
INTRAVENOUS | Status: AC
  Filled 2021-09-04: qty 100

## 2021-09-04 MED ORDER — OSMOLITE 1.2 CAL PO LIQD
1000.0000 mL | ORAL | Status: DC
  Filled 2021-09-04: qty 1000

## 2021-09-04 MED ORDER — VASOPRESSIN 20 UNITS/100 ML INFUSION FOR SHOCK
0.0000 [IU]/min | INTRAVENOUS | Status: DC
  Administered 2021-09-04: 0.03 [IU]/min via INTRAVENOUS
  Filled 2021-09-04 (×2): qty 100

## 2021-09-04 MED ORDER — PHENYLEPHRINE HCL-NACL 20-0.9 MG/250ML-% IV SOLN
0.0000 ug/min | INTRAVENOUS | Status: DC
  Filled 2021-09-04: qty 250

## 2021-09-04 MED ORDER — FENTANYL 2500MCG IN NS 250ML (10MCG/ML) PREMIX INFUSION
0.0000 ug/h | INTRAVENOUS | Status: DC
  Administered 2021-09-04: 50 ug/h via INTRAVENOUS
  Filled 2021-09-04: qty 250

## 2021-09-04 MED ORDER — SODIUM CHLORIDE 0.9 % IV BOLUS
1000.0000 mL | Freq: Once | INTRAVENOUS | Status: AC
  Administered 2021-09-04: 1000 mL via INTRAVENOUS

## 2021-09-04 MED ORDER — BISACODYL 10 MG RE SUPP
10.0000 mg | Freq: Once | RECTAL | Status: AC
  Administered 2021-09-04: 10 mg via RECTAL
  Filled 2021-09-04: qty 1

## 2021-09-04 MED ORDER — METOCLOPRAMIDE HCL 5 MG/ML IJ SOLN
5.0000 mg | Freq: Four times a day (QID) | INTRAMUSCULAR | Status: DC
  Administered 2021-09-04 – 2021-09-05 (×2): 5 mg via INTRAVENOUS
  Filled 2021-09-04 (×2): qty 2

## 2021-09-04 NOTE — Progress Notes (Signed)
RN called due to patient having a suspected bloody vomitus.  Patient is on heparin drip, this will be temporarily held, Gastric occult will be checked.  Hemoglobin this morning was 8.7, this was 9.7 yesterday, this may have been due to hemodilution effect considering the patient is on IV D5 NS. ?Patient is already on IV Protonix 40 mg twice daily ?At bedside, patient was intubated and sedated.  He was in no acute distress, vital signs were stable, bedside cardiac monitor showed normal sinus rhythm ?BP (!) 156/59   Pulse 83   Temp 98.7 ?F (37.1 ?C) (Oral)   Resp (!) 26   Ht '5\' 6"'$  (1.676 m)   Wt 49.6 kg   SpO2 98%   BMI 17.65 kg/m?  ? ?Continue to monitor patient, further management will be based on gastric occult findings. ? ? ?Total time:  15 minutes ?This includes time reviewing the chart including progress notes, labs, EKGs, taking medical decisions, ordering labs and documenting findings. ? ?

## 2021-09-04 NOTE — Progress Notes (Signed)
Gastric occult positive. Dr. Denton Brick notified. ?

## 2021-09-04 NOTE — Progress Notes (Signed)
ANTICOAGULATION CONSULT NOTE  ?Pharmacy Consult for Heparin ?Indication: atrial fibrillation ?Brief A/P: Heparin level subtherapeutic Increase Heparin rate ? ?Allergies  ?Allergen Reactions  ? Drug [Tape]   ?  PLEASE USE COBAN ,,,, ADHESIVE TAPE TEARS SKIN ?  ? Glycopyrrolate Other (See Comments)  ?  Intolerance--unknown reaction  ? Warfarin And Related Other (See Comments)  ?  Excessive bleeding  ? ? ?Patient Measurements: ?Height: '5\' 6"'$  (167.6 cm) ?Weight: 49.6 kg (109 lb 5.6 oz) ?IBW/kg (Calculated) : 63.8 ?HEPARIN DW (KG): 49.9  ? ?Vital Signs: ?Temp: 98.7 ?F (37.1 ?C) (03/12 0000) ?Temp Source: Oral (03/12 0000) ?BP: 111/54 (03/12 0115) ?Pulse Rate: 72 (03/12 0115) ? ?Labs: ?Recent Labs  ?  09/01/21 ?0314 09/01/21 ?1108 09/01/21 ?2315 09/18/2021 ?2703 09/03/21 ?5009 09/03/21 ?1500 09/04/21 ?0036  ?HGB 8.1*  --  7.6* 7.2* 9.7*  --   --   ?HCT 22.9*  --  22.3* 20.8* 27.3*  --   --   ?PLT 114*  --  115* 120* 111*  --   --   ?HEPARINUNFRC 0.47   < >  --   --  <0.10* <0.10* 0.19*  ?CREATININE 0.67  --   --  0.69 0.79  --   --   ? < > = values in this interval not displayed.  ? ? ? ?Estimated Creatinine Clearance: 68.9 mL/min (by C-G formula based on SCr of 0.79 mg/dL). ? ?Assessment: ?61 y.o. male with Afib for heparin ? ?Goal of Therapy:  ?Heparin level 0.3-0.7 units/ml ?Monitor platelets by anticoagulation protocol: Yes ?  ?Plan:  ?Increase Heparin 1300 units/hr ?Check heparin level in 8 hours. ? ?Phillis Knack, PharmD, BCPS ?  ? ? ? ? ? ?

## 2021-09-04 NOTE — Progress Notes (Signed)
Patient's blood pressures continuously getting lower. Versed and fentanyl drip stops. Dr. Denton Brick notified. 1L NS bolus ordered. Levophed drip at highest rate of 74mg. New orders for vasopressin and Neosynephrine.  ?

## 2021-09-04 NOTE — Progress Notes (Signed)
2 Days Post-Op  ?Subjective: ?Patient remains intubated.  Due to acute respiratory failure from aspiration, I intubated the patient without medications with a #7 ET tube.  CO2 monitor was placed after intubation to confirm placement.  Postintubation chest x-ray revealed adequate positioning of the tube at the 24 cm Alok Minshall. ?The patient has been started on Levophed for pressure support.  Chest x-ray did reveal right lower lobe infiltrate which was new, consistent with aspiration pneumonia. ? ?Objective: ?Vital signs in last 24 hours: ?Temp:  [97.6 ?F (36.4 ?C)-98.8 ?F (37.1 ?C)] 98.7 ?F (37.1 ?C) (03/12 0000) ?Pulse Rate:  [57-95] 95 (03/12 0830) ?Resp:  [14-35] 35 (03/12 0900) ?BP: (47-178)/(26-128) 112/64 (03/12 0900) ?SpO2:  [80 %-100 %] 100 % (03/12 0918) ?FiO2 (%):  [40 %-50 %] 40 % (03/12 0918) ?Last BM Date : 09/03/21 ? ?Intake/Output from previous day: ?03/11 0701 - 03/12 0700 ?In: 1548.5 [I.V.:644.4; NG/GT:140; IV Piggyback:474.1] ?Out: 600 [Urine:600] ?Intake/Output this shift: ?Total I/O ?In: 665.4 [I.V.:465.4; IV Piggyback:200] ?Out: -  ? ?GI: Soft, incision healing well.  J-tube in place. ? ?Lab Results:  ?Recent Labs  ?  09/03/21 ?0412 09/04/21 ?0358  ?WBC 7.0 6.6  ?HGB 9.7* 8.7*  ?HCT 27.3* 24.7*  ?PLT 111* 113*  ? ?BMET ?Recent Labs  ?  09/03/21 ?0412 09/04/21 ?0358  ?NA 129* 128*  ?K 3.6 3.2*  ?CL 98 100  ?CO2 24 22  ?GLUCOSE 109* 154*  ?BUN 10 14  ?CREATININE 0.79 0.62  ?CALCIUM 8.4* 7.7*  ? ?PT/INR ?No results for input(s): LABPROT, INR in the last 72 hours. ? ?Studies/Results: ?DG CHEST PORT 1 VIEW ? ?Result Date: 09/03/2021 ?CLINICAL DATA:  Difficulty breathing EXAM: PORTABLE CHEST 1 VIEW COMPARISON:  09/17/2021 FINDINGS: There is new infiltrate in the right lower lung fields. There are small linear densities in the left lower lung fields. There is gas in the posterior mediastinum, most likely in the hiatal hernia or distended esophagus. There is blunting of right lateral CP angle suggesting small  effusion. There is no pneumothorax. Tip of endotracheal tube is 3.6 cm above the carina. IMPRESSION: New infiltrates are seen in the lower lung fields, more so on the right side suggesting pneumonia or aspiration. Electronically Signed   By: Ernie Avena M.D.   On: 09/03/2021 14:29  ? ?DG ABD ACUTE 2+V W 1V CHEST ? ?Result Date: 09/03/2021 ?CLINICAL DATA:  Dyspnea and respiratory abnormalities. EXAM: DG ABDOMEN ACUTE WITH 1 VIEW CHEST COMPARISON:  Chest radiograph on 09/17/2021 FINDINGS: Heart size remains normal. Aortic atherosclerotic calcification noted. Increased small right pleural effusion is seen since prior study. Mild atelectasis or infiltrate seen in the right lung base. Left lung remains clear. Midline abdominal skin staples are seen. Surgical drain is seen in the left abdomen. Gas-filled small bowel is seen, however there is no evidence of dilated bowel loops. IMPRESSION: Increased small right pleural effusion. Mild right basilar atelectasis versus infiltrate. Nonobstructive bowel gas pattern, with surgical drain in left abdomen. Electronically Signed   By: Danae Orleans M.D.   On: 09/03/2021 13:19  ? ?[place] EKG SITE RITE ? ?Result Date: 09/03/2021 ?If [employer] not attached, placement could not be confirmed due to current cardiac rhythm.  ? ?Anti-infectives: ?Anti-infectives (From admission, onward)  ? ? Start     Dose/Rate Route Frequency Ordered Stop  ? 09/03/21 1730  Ampicillin-Sulbactam (UNASYN) 3 g in sodium chloride 0.9 % 100 mL IVPB       ? 3 g ?200 mL/hr over  30 Minutes Intravenous Every 6 hours 09/03/21 1650    ? 09/13/2021 1002  ceFAZolin (ANCEF) 2-4 GM/100ML-% IVPB       ?Note to Pharmacy: Waynard Edwards S: cabinet override  ?    09/18/2021 1002 08/24/2021 1204  ? 08/25/2021 0600  ceFAZolin (ANCEF) IVPB 2g/100 mL premix       ? 2 g ?200 mL/hr over 30 Minutes Intravenous On call to O.R. 09/01/21 2225 09/11/2021 1200  ? ?  ? ? ?Assessment/Plan: ?s/p Procedure(s): ?JEJUNOSTOMY ?Impression: Patient  still intubated secondary to aspiration pneumonia, on 7.5 mcg of Levophed.  Heparin drip has been stopped due to oropharyngeal blood.  Prognosis is guarded.  We will follow peripherally with you. ? LOS: 7 days  ? ? ?Franky Macho ?09/04/2021  ?

## 2021-09-04 NOTE — Progress Notes (Signed)
?Progress Note ? ? ?Patient: Jimmy Tucker [place] WCB:[id number] DOB: 01/12/1961 DOA: 09/09/2021     7 ?DOS: the patient was seen and examined on 09/04/2021 ?  ?Brief hospital admission narrative course ?As per H&P written by Dr. Denton Brick on 09/13/2021 ?[name] is a 61 y.o. male with medical history significant for esophageal cancer, hypertension, coronary artery disease, atrial fibrillation, COPD, alcohol abuse. ?Patient was brought to the ED from home with reports of multiple complaints.  Family-spouse and daughter are present at bedside and assist with the history.  They report confusion today.  But over the past several days, since patient had his esophageal dilatation 2/22, he reports poor oral intake, has barely eaten, neurolysed weakness, patient unable to stand over the past few days, cannot walk.  He reports feeling dizzy a week ago, resulting in a fall twice that day.  Reports a feeling of panic attack today with transient difficulty breathing.  But denies specific palpitations.  He denies chest pain. ?  ?ED Course: Tachycardic, heart rate up to 165 on EKG.  Temperature 97.6.  Respiratory rate 17-32.  Blood pressure systolic initially 616/07.  Sodium 124.  Potassium 3.  Magnesium 1.8.  TSH 4.699.  Troponin 7 > 11 EKG shows atrial fibrillation with RVR.  He was given Cardizem with subsequent drop in blood pressure to 74/64.  1 L bolus was given.  Cardizem discontinued.  ED provider talked to cardiology, okay with amiodarone and heparin drip.  And cardiology team to see in the morning. ? ?Assessment and Plan: ? ?PAFib-- ?-He has a history of paroxysmal A-fib for which he was on anticoagulation with Xarelto per notes from 2020.   ?-Normal TSH ?-2D echo demonstrating grade 1 diastolic dysfunction; no wall motion abnormalities and preserved ejection fraction. ?-Developed hypotension on IV Cardizem ?Converted to sinus rhythm on IV amiodarone okay to switch to  amiodarone 200 mg daily via J tube on 09/14/2021 ?-Discontinued IV heparin on 09/04/2021 due to concerns about oropharyngeal bleeding postintubation ?- patient is not a candidate for long-term anticoagulation.   ?--CHADsVASC score 2 ? ?Sepsis secondary to aspiration pneumonia --on 09/03/2021 patient had episode of aspiration--with severe respiratory distress and unresponsiveness ?-Emergently intubated by Dr. Arnoldo Morale ?-Postintubation chest x-ray shows new infiltrates are seen in the lower lung fields, more so on the right side suggesting pneumonia or aspiration. ?-Continue IV Unasyn ?-Continue bronchodilators ?-On 09/04/2021 patient developed fevers and tachypnea while on vent and tachycardia--he meets sepsis criteria ?-IV fluid boluses given ? ?Fall at home, initial encounter ?-B12, ammonia level and TSH within normal limits. ?-Family reporting difficulty sleeping and chronic use of pain medications and benzodiazepines as an outpatient. ?-More coherent, ?- PT eval once medically more stable ? ?Electrolyte abnormalities/-Patient with hyponatremia, hypokalemia, hypomagnesemia and hypophosphatemia ?-Continue to follow electrolytes trend and further replete them as needed ? ?Failure to thrive in adult-in the setting of esophageal stricture ?-Patient with significant quality decline and multiple falls at home in the setting of failure to thrive, deconditioning and poor balance. ?-Chest CTA negative for PE, suggest nonspecific infectious or inflammatory bronchitis.  He is afebrile and without leukocytosis. ?-CT head without acute intracranial abnormalities. ?- Poor p.o. intake secondary to severe esophageal stricture.  Patient is not a candidate for gastrostomy tube placement due to the esophageal stricture and the intrathoracic placement of the stomach ?-As per Dr. Owens Shark from IR not a candidate for IR placement of PEG tube ?-s/p open J-tube placement on 09/23/2021 by Dr Arnoldo Morale--- ?-c/n Osmolite 1.2  at 30 ml/hr for  now ? ?Esophageal stricture ?-With failure to thrive.  Recurrent issue for patient,, has had over 50 EGDs and dilatation related to esophageal cancer for which he had surgery in the past.  ?--GI input appreciated ?S/p Open J tube on 09/04/2021 as above ? ?Hypertension ?-Soft blood pressure at time of presentation in the setting of arrhythmia and Cardizem drip ?-Now stable. ?-Hold oral metoprolol due to BP concerns ?--as needed IV hydralazine. ? ?Stage II pressure injury:  ?-Appreciated in left buttocks present at time of admission ?-Continue constant repositioning and barrier measures. ? ?Metabolic encephalopathy ?-In the setting of hospital-acquired delirium and most likely mild withdrawal from chronic pain medications and benzos that he normally uses as an outpatient. ?-Mentation is back to baseline, doing okay on Xanax ? ?Acute on chronic anemia--- partly nutritional ?-Hgb stable after transfusion on 08/27/2021 ?-Episode of bleeding from oropharynx overnight, IV heparin drip discontinued ?-Repeat Hgb remains stable ? ? ?CRITICAL CARE ?Performed by: Roxan Hockey ? ? ?Total critical care time: 46 minutes ? ?Critical care time was exclusive of separately billable procedures and treating other patients. ? ?Critical care was necessary to treat or prevent imminent or life-threatening deterioration. ?--acute hypoxic respiratory distress/respiratory failure requiring intubation and ventilation, currently on Levophed for pressure support ?Vent Settings- 40%/5/16/400 ?-Continue Versed and fentanyl for sedation ? ?Critical care was time spent personally by me on the following activities: development of treatment plan with patient and/or surrogate as well as nursing, discussions with consultants, evaluation of patient's response to treatment, examination of patient, obtaining history from patient or surrogate, ordering and performing treatments and interventions, ordering and review of laboratory studies, ordering and review of  radiographic studies, pulse oximetry and re-evaluation of patient's condition. ? ? ?Subjective:  ?- ?Fevers, tachypnea ?-Multiple family members at bedside ?-Urine output is adequate ?-Tolerating tube feeding okay ? ?Physical Exam: ?Vitals:  ? 09/04/21 1545 09/04/21 1558 09/04/21 1600 09/04/21 1615  ?BP: 117/62  (!) 98/56 90/61  ?Pulse: 99     ?Resp: 19  (!) 21 (!) 21  ?Temp:  98.3 ?F (36.8 ?C)    ?TempSrc:  Axillary    ?SpO2: 99%     ?Weight:      ?Height:      ? ? ?Physical Exam ? ?Gen:-Intubated and sedated ?HEENT:-ET tube  ?Lungs-diminished breath sounds with scattered rhonchi bilaterally ?CV- S1, S2 normal, irregular  ?abd-  +ve B.Sounds, Abd Soft, J tube in situ ?Extremity/Skin:- No  edema,   good pedal pulses  ?Neuro-Psych--intubated and sedated ?MSK-right arm PICC line site ?GU-Foley in situ ? ?Family Communication: Wife and multiple fam members  at bedside. ? ?  Code Status: Full Code ? ? ?Disposition: -Intubated and sedated---- prognosis is guarded ?Status is: Inpatient ? ?Remains inpatient appropriate because: Currently intubated and sedated- ? Planned Discharge Destination: May need rehab ? ? ?Author: ?Roxan Hockey, MD ?09/04/2021 4:32 PM ? ?For on call review [email or web address].  ? ? ?

## 2021-09-04 NOTE — Progress Notes (Signed)
Conference at bedside with wife, daughter and pt's siblings--- ? ?Pt is intubated, Family/Wife Request No CPR, ok for Pressors ? ?Partial Code-- ? ?Roxan Hockey, MD ? ?

## 2021-09-05 ENCOUNTER — Encounter (INDEPENDENT_AMBULATORY_CARE_PROVIDER_SITE_OTHER): Payer: Self-pay

## 2021-09-05 DIAGNOSIS — J690 Pneumonitis due to inhalation of food and vomit: Secondary | ICD-10-CM | POA: Diagnosis not present

## 2021-09-05 DIAGNOSIS — A419 Sepsis, unspecified organism: Secondary | ICD-10-CM | POA: Diagnosis not present

## 2021-09-05 DIAGNOSIS — J189 Pneumonia, unspecified organism: Secondary | ICD-10-CM

## 2021-09-05 LAB — TYPE AND SCREEN
ABO/RH(D): O POS
Antibody Screen: NEGATIVE
Unit division: 0
Unit division: 0

## 2021-09-05 LAB — RENAL FUNCTION PANEL
Albumin: <1.5 g/dL — ABNORMAL LOW (ref 3.5–5.0)
Anion gap: 8 (ref 5–15)
BUN: 21 mg/dL — ABNORMAL HIGH (ref 6–20)
CO2: 12 mmol/L — ABNORMAL LOW (ref 22–32)
Calcium: 7.2 mg/dL — ABNORMAL LOW (ref 8.9–10.3)
Chloride: 97 mmol/L — ABNORMAL LOW (ref 98–111)
Creatinine, Ser: 1.35 mg/dL — ABNORMAL HIGH (ref 0.61–1.24)
GFR, Estimated: >60 mL/min (ref >60)
Glucose, Bld: 343 mg/dL — ABNORMAL HIGH (ref 70–99)
Phosphorus: 7 mg/dL — ABNORMAL HIGH (ref 2.5–4.6)
Potassium: 5.6 mmol/L — ABNORMAL HIGH (ref 3.5–5.1)
Sodium: 117 mmol/L — CL (ref 135–145)

## 2021-09-05 LAB — GLUCOSE, CAPILLARY
Glucose-Capillary: 119 mg/dL — ABNORMAL HIGH (ref 70–99)
Glucose-Capillary: 183 mg/dL — ABNORMAL HIGH (ref 70–99)
Glucose-Capillary: 167 mg/dL — ABNORMAL HIGH (ref 70–99)
Glucose-Capillary: 17 mg/dL — CL (ref 70–99)
Glucose-Capillary: 21 mg/dL — CL (ref 70–99)
Glucose-Capillary: 531 mg/dL (ref 70–99)

## 2021-09-05 LAB — BASIC METABOLIC PANEL
Anion gap: 15 (ref 5–15)
BUN: 22 mg/dL — ABNORMAL HIGH (ref 6–20)
CO2: 8 mmol/L — ABNORMAL LOW (ref 22–32)
Calcium: 7.7 mg/dL — ABNORMAL LOW (ref 8.9–10.3)
Chloride: 101 mmol/L (ref 98–111)
Creatinine, Ser: 1.34 mg/dL — ABNORMAL HIGH (ref 0.61–1.24)
GFR, Estimated: >60 mL/min (ref >60)
Glucose, Bld: 107 mg/dL — ABNORMAL HIGH (ref 70–99)
Potassium: 6.2 mmol/L — ABNORMAL HIGH (ref 3.5–5.1)
Sodium: 124 mmol/L — ABNORMAL LOW (ref 135–145)

## 2021-09-05 LAB — CBC
HCT: 23.9 % — ABNORMAL LOW (ref 39.0–52.0)
Hemoglobin: 7.6 g/dL — ABNORMAL LOW (ref 13.0–17.0)
MCH: 32.5 pg (ref 26.0–34.0)
MCHC: 31.8 g/dL (ref 30.0–36.0)
MCV: 102.1 fL — ABNORMAL HIGH (ref 80.0–100.0)
Platelets: 134 10*3/uL — ABNORMAL LOW (ref 150–400)
RBC: 2.34 MIL/uL — ABNORMAL LOW (ref 4.22–5.81)
RDW: 13.7 % (ref 11.5–15.5)
WBC: 14.3 10*3/uL — ABNORMAL HIGH (ref 4.0–10.5)
nRBC: 0.3 % — ABNORMAL HIGH (ref 0.0–0.2)

## 2021-09-05 LAB — BPAM RBC
Blood Product Expiration Date: 202304112359
Blood Product Expiration Date: 202304112359
ISSUE DATE / TIME: 202303100945
Unit Type and Rh: 5100
Unit Type and Rh: 5100

## 2021-09-05 LAB — HEPARIN LEVEL (UNFRACTIONATED): Heparin Unfractionated: <0.1 IU/mL — ABNORMAL LOW (ref 0.30–0.70)

## 2021-09-05 MED ORDER — NOREPINEPHRINE 4 MG/250ML-% IV SOLN
0.0000 ug/min | INTRAVENOUS | Status: DC
  Administered 2021-09-05: 30 ug/min via INTRAVENOUS

## 2021-09-05 MED ORDER — DEXTROSE 50 % IV SOLN
INTRAVENOUS | Status: AC
  Filled 2021-09-05: qty 50

## 2021-09-05 MED ORDER — SODIUM POLYSTYRENE SULFONATE 15 GM/60ML PO SUSP
45.0000 g | Freq: Once | ORAL | Status: DC
Start: 2021-09-05 — End: 2021-09-05

## 2021-09-05 MED ORDER — CALCIUM GLUCONATE-NACL 1-0.675 GM/50ML-% IV SOLN: 1.0000 g | Freq: Once | INTRAVENOUS | Status: DC

## 2021-09-05 MED ORDER — DEXTROSE-NACL 5-0.9 % IV SOLN: INTRAVENOUS | Status: DC

## 2021-09-05 MED ORDER — SODIUM BICARBONATE 8.4 % IV SOLN
100.0000 meq | Freq: Once | INTRAVENOUS | Status: AC
  Administered 2021-09-05: 100 meq via INTRAVENOUS

## 2021-09-05 MED ORDER — SODIUM BICARBONATE 8.4 % IV SOLN
INTRAVENOUS | Status: DC
  Filled 2021-09-05 (×4): qty 1000

## 2021-09-05 MED ORDER — SODIUM CHLORIDE 0.9 % IV SOLN: Freq: Once | INTRAVENOUS | Status: AC

## 2021-09-05 MED ORDER — SODIUM BICARBONATE 8.4 % IV SOLN
INTRAVENOUS | Status: AC
  Filled 2021-09-05: qty 50

## 2021-09-06 ENCOUNTER — Encounter (HOSPITAL_COMMUNITY): Payer: Self-pay | Admitting: General Surgery

## 2021-09-06 LAB — BLOOD CULTURE ID PANEL (REFLEXED) - BCID2

## 2021-09-08 LAB — CULTURE, BLOOD (ROUTINE X 2): Special Requests: ADEQUATE

## 2021-09-08 MED FILL — Medication: Qty: 1 | Status: AC

## 2021-09-09 LAB — CULTURE, BLOOD (ROUTINE X 2): Culture: NO GROWTH

## 2021-09-22 ENCOUNTER — Other Ambulatory Visit (HOSPITAL_COMMUNITY): Payer: Medicare Other

## 2021-09-24 NOTE — Progress Notes (Addendum)
Critical Care Note::-- ?- ?Conference at bedside with patient's wife, patient's daughter and other family members ? ?-- Patient had another episode of CVA hypotension required increasing IV Levophed and vasopressin ?- ?Lab work shows increasing metabolic acidosis and worsening hyperkalemia and worsening renal function with near anuria ? ?-Interventions for severe metabolic acidosis and hyperkalemia initially ordered ?- ? ?- ?-Family request that we do not escalate care any further ?- ?They request that we discontinue pressors, they also request that we do not extubate patient--- they do not want to see patient have agonal breathing ?- ?They want patient to be as comfortable as possible she will request I will continue Versed and fentanyl drips ?- ?Patient remains intubated and ventilated with severe hypoxic respiratory failure secondary to aspiration pneumonia and persistent hemodynamic instability due to septic shock from aspiration pneumonia requiring pressors ? ?- ?They request transition to comfort care with modifications as above ? ?- ?RN Ms. Foley present at bedside ? ?-Total critical care time throughout the day was more than 48 minutes ? ?- ? ?Roxan Hockey, MD ? ? ? ?

## 2021-09-24 NOTE — Progress Notes (Signed)
Long discussion with family (daughter and spouse) at bedside this morning along with Dr. Joesph Fillers at bedside. Family is not wanting patient to suffer any longer. Plan is to cut off all pressors and fluids, yet leave the Versed and Fentanyl going to keep patient comfortable. Pt to remain on vent, as MD expects an in-hospital death while possibly still on the vent. ? ?Will continue to monitor. Pt surrounded by multiple family members at bedside.  ?

## 2021-09-24 NOTE — Death Summary Note (Signed)
DEATH SUMMARY   Patient Details  Name: Jimmy Tucker MRN: [id number] DOB: 11-Jul-1960 TML:YYTKP, Purcell Nails, MD Admission/Discharge Information   Admit Date:  09-20-2021  Date of Death: Date of Death: 09/28/21  Time of Death: Time of Death: 31-Aug-1307  Length of Stay: 8   Principle Cause of death: Sepsis with septic shock due to aspiration pneumonia with Acute Hypoxic Resp failure  Hospital Diagnoses: Principal Problem:   Sepsis with Septic Shock due to Aspiration pneumonia [facility]) Active Problems:   Dysphagia   Esophageal stricture   Atrial fibrillation with rapid ventricular response ([place])   Acute respiratory failure with hypoxia requiring intubation due to aspiration pneumonia   Aspiration pneumonia with Hypoxic Resp failure   Failure to thrive in adult   Hypertension   Electrolyte abnormality   Fall at home, initial encounter   Protein-calorie malnutrition, severe   Hospital Course: No notes on file  Assessment and Plan: Problem  Sepsis with Septic Shock due to Aspiration pneumonia ([place])  Aspiration pneumonia with Hypoxic Resp failure  Acute respiratory failure with hypoxia requiring intubation due to aspiration pneumonia  Atrial Fibrillation With Rapid Ventricular Response ([facility])  Esophageal Stricture  Dysphagia  Failure to Thrive in Adult    Severe sepsis with septic shock secondary to aspiration pneumonia resulting in acute hypoxic respiratory failure--on 09/03/2021 patient had episode of aspiration--with severe respiratory distress and unresponsiveness -Emergently intubated by Dr. Arnoldo Morale -Postintubation chest x-ray shows new infiltrates are seen in the lower lung fields, more so on the right side suggesting pneumonia or aspiration. Treated with IV Unasyn -On 09/04/2021 patient developed fevers and tachypnea while on vent and tachycardia--he met sepsis criteria -IV fluid boluses given, was started on Levophed and vasopressin for pressure support due to persistent  hypotension -On 09-28-2021--- family requested transition to comfort care please see separate note/documentation -Patient expired rather quickly while still on the ventilator after Levophed and vasopressin were discontinued at family's request  -Versed and fentanyl drips were continued for comfort until patient expired - Time of Death: 2021/09/28 at 1309 PM  PAFib-- -He has a history of paroxysmal A-fib for which he was on anticoagulation with Xarelto per notes from Aug 30, 2018.   -Normal TSH -2D echo demonstrating grade 1 diastolic dysfunction; no wall motion abnormalities and preserved ejection fraction. -Developed hypotension on IV Cardizem Converted to sinus rhythm on IV amiodarone okay to switch to amiodarone 200 mg daily via J tube on 08/24/2021 -Discontinued IV heparin on 09/04/2021 due to concerns about oropharyngeal bleeding postintubation - patient was Not a candidate for long-term anticoagulation.   --CHADsVASC score 2    Fall at home, initial encounter -B12, ammonia level and TSH within normal limits. -Family reporting difficulty sleeping and chronic use of pain medications and benzodiazepines as an outpatient.   AKI with severe metabolic acidosis and hyperkalemia-- -Patient became anuric -Interventions for hyperkalemia and metabolic acidosis were ordered -Family requested transition to comfort care on 28-Sep-2021 -   Failure to thrive in adult-in the setting of esophageal stricture -Patient with significant quality decline and multiple falls at home in the setting of failure to thrive, deconditioning and poor balance. -Chest CTA negative for PE, suggest nonspecific infectious or inflammatory bronchitis.  He is afebrile and without leukocytosis. -CT head without acute intracranial abnormalities. - Poor p.o. intake secondary to severe esophageal stricture.  Patient was not a candidate for gastrostomy tube placement due to the esophageal stricture and the intrathoracic placement of the  stomach -As per Dr. Owens Shark from IR  not a candidate for IR placement of PEG tube -s/p open J-tube placement on 08/26/2021 by Dr Arnoldo Morale---   Esophageal stricture -With failure to thrive.  Recurrent issue for patient,, has had over 67 EGDs and dilatation related to esophageal cancer for which he had surgery in the past.  --GI input appreciated S/p Open J tube on 09/07/2021 as above  Acute on chronic anemia--- partly nutritional -Hgb stable after transfusion on 09/17/2021 -Episode of bleeding from oropharynx overnight, IV heparin drip discontinued -Repeat Hgb trending down  Procedures: Intubation/PICC Line/Open J tube  Consultations: PCCM/Cardiology  The results of significant diagnostics from this hospitalization (including imaging, microbiology, ancillary and laboratory) are listed below for reference.   Significant Diagnostic Studies: CT Head Wo Contrast  Result Date: 09/10/2021 CLINICAL DATA:  Altered mental status. EXAM: CT HEAD WITHOUT CONTRAST TECHNIQUE: Contiguous axial images were obtained from the base of the skull through the vertex without intravenous contrast. RADIATION DOSE REDUCTION: This exam was performed according to the departmental dose-optimization program which includes automated exposure control, adjustment of the mA and/or kV according to patient size and/or use of iterative reconstruction technique. COMPARISON:  February 11, 2007 FINDINGS: Brain: There is mild to moderate severity cerebral atrophy with widening of the extra-axial spaces and ventricular dilatation. There are areas of decreased attenuation within the white matter tracts of the supratentorial brain, consistent with microvascular disease changes. Vascular: No hyperdense vessel or unexpected calcification. Skull: Negative for an acute fracture. A chronic fracture of the left zygomatic arch is seen. Sinuses/Orbits: No acute finding. Other: None. IMPRESSION: Generalized cerebral atrophy without evidence of an acute  intracranial abnormality. Electronically Signed   By: Virgina Norfolk M.D.   On: 09/17/2021 20:29   CT Angio Chest PE W and/or Wo Contrast  Result Date: 09/01/2021 CLINICAL DATA:  PE suspected, shortness of breath, confusion, history of esophageal cancer EXAM: CT ANGIOGRAPHY CHEST WITH CONTRAST TECHNIQUE: Multidetector CT imaging of the chest was performed using the standard protocol during bolus administration of intravenous contrast. Multiplanar CT image reconstructions and MIPs were obtained to evaluate the vascular anatomy. RADIATION DOSE REDUCTION: This exam was performed according to the departmental dose-optimization program which includes automated exposure control, adjustment of the mA and/or kV according to patient size and/or use of iterative reconstruction technique. CONTRAST:  29m OMNIPAQUE IOHEXOL 350 MG/ML SOLN COMPARISON:  10/20/2014 FINDINGS: Cardiovascular: Satisfactory opacification of the pulmonary arteries to the segmental level. No evidence of pulmonary embolism. Normal heart size. Extensive three-vessel coronary artery calcifications and/or stents. No pericardial effusion. Aortic atherosclerosis Mediastinum/Nodes: No enlarged mediastinal, hilar, or axillary lymph nodes. Status post pull-through esophagectomy. Thyroid gland and trachea demonstrate no significant findings. Lungs/Pleura: Mild, diffuse bilateral bronchial wall thickening. Bandlike scarring of the bilateral lung bases. No pleural effusion or pneumothorax. Upper Abdomen: No acute abnormality. Musculoskeletal: No chest wall abnormality. No acute osseous findings. Review of the MIP images confirms the above findings. IMPRESSION: 1. Negative examination for pulmonary embolism. 2. Mild, diffuse bilateral bronchial wall thickening, consistent with nonspecific infectious or inflammatory bronchitis. 3. Status post pull-through esophagectomy. No evidence of malignant recurrence in the chest. 4. Coronary artery disease. Aortic  Atherosclerosis (ICD10-I70.0). Electronically Signed   By: ADelanna AhmadiM.D.   On: 09/18/2021 20:34   DG Chest Port 1 View  Result Date: 09/04/2021 CLINICAL DATA:  Dyspnea, respiratory abnormalities EXAM: PORTABLE CHEST 1 VIEW COMPARISON:  Portable exam 1806 hours compared to 09/03/2021 FINDINGS: Tip of endotracheal tube projects 1.9 cm above carina. RIGHT arm PICC line tip  projects over SVC. Stable heart size and mediastinal contours. Increased consolidation involving RIGHT middle and RIGHT lower lobes Atelectasis and small trait at medial LEFT lung base again seen. Suspect underlying emphysematous changes. No definite pleural effusion or pneumothorax. Bones demineralized. Atherosclerotic calcification aorta. IMPRESSION: Increased consolidation of RIGHT middle and RIGHT lower lobes as well as medial LEFT lower lobe. Aortic Atherosclerosis (ICD10-I70.0) and Emphysema (ICD10-J43.9). Electronically Signed   By: Lavonia Dana M.D.   On: 09/04/2021 19:16   DG CHEST PORT 1 VIEW  Result Date: 09/03/2021 CLINICAL DATA:  Difficulty breathing EXAM: PORTABLE CHEST 1 VIEW COMPARISON:  08/31/2021 FINDINGS: There is new infiltrate in the right lower lung fields. There are small linear densities in the left lower lung fields. There is gas in the posterior mediastinum, most likely in the hiatal hernia or distended esophagus. There is blunting of right lateral CP angle suggesting small effusion. There is no pneumothorax. Tip of endotracheal tube is 3.6 cm above the carina. IMPRESSION: New infiltrates are seen in the lower lung fields, more so on the right side suggesting pneumonia or aspiration. Electronically Signed   By: Elmer Picker M.D.   On: 09/03/2021 14:29   DG Chest Port 1 View  Result Date: 09/21/2021 CLINICAL DATA:  shortness of breath EXAM: PORTABLE CHEST 1 VIEW COMPARISON:  March 05, 2021 FINDINGS: The cardiomediastinal silhouette is unchanged in contour.Atherosclerotic calcifications of the aorta.  Unchanged blunting of the RIGHT costophrenic angle. No large pleural effusion. No pneumothorax. No acute pleuroparenchymal abnormality. Visualized abdomen is unremarkable. IMPRESSION: No acute cardiopulmonary abnormality. Electronically Signed   By: Valentino Saxon M.D.   On: 09/14/2021 16:25   DG ABD ACUTE 2+V W 1V CHEST  Result Date: 09/03/2021 CLINICAL DATA:  Dyspnea and respiratory abnormalities. EXAM: DG ABDOMEN ACUTE WITH 1 VIEW CHEST COMPARISON:  Chest radiograph on 09/04/2021 FINDINGS: Heart size remains normal. Aortic atherosclerotic calcification noted. Increased small right pleural effusion is seen since prior study. Mild atelectasis or infiltrate seen in the right lung base. Left lung remains clear. Midline abdominal skin staples are seen. Surgical drain is seen in the left abdomen. Gas-filled small bowel is seen, however there is no evidence of dilated bowel loops. IMPRESSION: Increased small right pleural effusion. Mild right basilar atelectasis versus infiltrate. Nonobstructive bowel gas pattern, with surgical drain in left abdomen. Electronically Signed   By: Marlaine Hind M.D.   On: 09/03/2021 13:19   ECHOCARDIOGRAM COMPLETE  Result Date: 08/29/2021    ECHOCARDIOGRAM REPORT   Patient Name:   Jimmy Tucker Date of Exam: 08/29/2021 Medical Rec #:  [id number]     Height:       66.0 in Accession #:    [id number]    Weight:       110.0 lb Date of Birth:  June 29, 1960     BSA:          1.551 m Patient Age:    30 years      BP:           153/95 mmHg Patient Gender: M             HR:           100 bpm. Exam Location:  [name] Procedure: 2D Echo, Cardiac Doppler and Color Doppler Indications:    Atrial Fibrillation  History:        Patient has no prior history of Echocardiogram examinations.  CAD, PAD, Arrythmias:Atrial Fibrillation; Risk                 Factors:Hypertension, Dyslipidemia and Current Smoker. Alcohol                 abuse.  Sonographer:    Wenda Low Referring  Phys: [phone] Leanne Chang [facility]  Sonographer Comments: Technically difficult study due to poor echo windows. Image acquisition challenging due to patient body habitus. IMPRESSIONS  1. Patient in NSR during exam . Left ventricular ejection fraction, by estimation, is 60 to 65%. The left ventricle has normal function. The left ventricle has no regional wall motion abnormalities. There is mild left ventricular hypertrophy. Left ventricular diastolic parameters are consistent with Grade I diastolic dysfunction (impaired relaxation).  2. Right ventricular systolic function is normal. The right ventricular size is normal. There is severely elevated pulmonary artery systolic pressure.  3. The mitral valve is abnormal. Trivial mitral valve regurgitation. No evidence of mitral stenosis.  4. The aortic valve is tricuspid. There is mild calcification of the aortic valve. There is mild thickening of the aortic valve. Aortic valve regurgitation is not visualized. Aortic valve sclerosis is present, with no evidence of aortic valve stenosis.  5. The inferior vena cava is normal in size with greater than 50% respiratory variability, suggesting right atrial pressure of 3 mmHg. FINDINGS  Left Ventricle: Patient in NSR during exam. Left ventricular ejection fraction, by estimation, is 60 to 65%. The left ventricle has normal function. The left ventricle has no regional wall motion abnormalities. The left ventricular internal cavity size was normal in size. There is mild left ventricular hypertrophy. Left ventricular diastolic parameters are consistent with Grade I diastolic dysfunction (impaired relaxation). Right Ventricle: The right ventricular size is normal. No increase in right ventricular wall thickness. Right ventricular systolic function is normal. There is severely elevated pulmonary artery systolic pressure. The tricuspid regurgitant velocity is 4.14 m/s, and with an assumed right atrial pressure of 8 mmHg, the estimated  right ventricular systolic pressure is 18.2 mmHg. Left Atrium: Left atrial size was normal in size. Right Atrium: Right atrial size was normal in size. Pericardium: There is no evidence of pericardial effusion. Mitral Valve: The mitral valve is abnormal. There is mild thickening of the mitral valve leaflet(s). There is mild calcification of the mitral valve leaflet(s). Mild mitral annular calcification. Trivial mitral valve regurgitation. No evidence of mitral valve stenosis. MV peak gradient, 3.5 mmHg. The mean mitral valve gradient is 2.0 mmHg. Tricuspid Valve: The tricuspid valve is normal in structure. Tricuspid valve regurgitation is not demonstrated. No evidence of tricuspid stenosis. Aortic Valve: The aortic valve is tricuspid. There is mild calcification of the aortic valve. There is mild thickening of the aortic valve. Aortic valve regurgitation is not visualized. Aortic valve sclerosis is present, with no evidence of aortic valve stenosis. Aortic valve mean gradient measures 1.0 mmHg. Aortic valve peak gradient measures 3.5 mmHg. Aortic valve area, by VTI measures 2.40 cm. Pulmonic Valve: The pulmonic valve was normal in structure. Pulmonic valve regurgitation is not visualized. No evidence of pulmonic stenosis. Aorta: The aortic root is normal in size and structure. Venous: The inferior vena cava is normal in size with greater than 50% respiratory variability, suggesting right atrial pressure of 3 mmHg. IAS/Shunts: The interatrial septum was not well visualized.  LEFT VENTRICLE PLAX 2D LVIDd:         3.40 cm   Diastology LVIDs:  2.30 cm   LV e' medial:    7.51 cm/s LV PW:         1.20 cm   LV E/e' medial:  8.8 LV IVS:        1.20 cm   LV e' lateral:   7.29 cm/s LVOT diam:     1.90 cm   LV E/e' lateral: 9.1 LV SV:         41 LV SV Index:   27 LVOT Area:     2.84 cm  RIGHT VENTRICLE RV Basal diam:  2.95 cm RV Mid diam:    3.00 cm RV S prime:     14.00 cm/s TAPSE (M-mode): 2.3 cm LEFT ATRIUM          Index       RIGHT ATRIUM           Index LA diam:    2.90 cm 1.87 cm/m  RA Area:     10.50 cm                                 RA Volume:   18.00 ml  11.60 ml/m  AORTIC VALVE                    PULMONIC VALVE AV Area (Vmax):    2.38 cm     PV Vmax:       0.77 m/s AV Area (Vmean):   2.55 cm     PV Peak grad:  2.4 mmHg AV Area (VTI):     2.40 cm AV Vmax:           94.00 cm/s AV Vmean:          53.100 cm/s AV VTI:            0.171 m AV Peak Grad:      3.5 mmHg AV Mean Grad:      1.0 mmHg LVOT Vmax:         79.00 cm/s LVOT Vmean:        47.800 cm/s LVOT VTI:          0.145 m LVOT/AV VTI ratio: 0.85  AORTA Ao Root diam: 3.30 cm MITRAL VALVE               TRICUSPID VALVE MV Area (PHT): 3.97 cm    TR Peak grad:   68.6 mmHg MV Area VTI:   1.83 cm    TR Vmax:        414.00 cm/s MV Peak grad:  3.5 mmHg MV Mean grad:  2.0 mmHg    SHUNTS MV Vmax:       0.93 m/s    Systemic VTI:  0.14 m MV Vmean:      62.7 cm/s   Systemic Diam: 1.90 cm MV Decel Time: 191 msec MV E velocity: 66.00 cm/s MV A velocity: 82.30 cm/s MV E/A ratio:  0.80 Jenkins Rouge MD Electronically signed by Jenkins Rouge MD Signature Date/Time: 08/29/2021/12:17:46 PM    Final    [place] EKG SITE RITE  Result Date: 09/03/2021 If Site Rite image not attached, placement could not be confirmed due to current cardiac rhythm.   Microbiology: Recent Results (from the past 240 hour(s))  Resp Panel by RT-PCR (Flu A&B, Covid) Nasopharyngeal Swab     Status: None   Collection Time: 09/10/2021  3:47 PM   Specimen: Nasopharyngeal Swab; Nasopharyngeal(NP) swabs in  vial transport medium  Result Value Ref Range Status   SARS Coronavirus 2 by RT PCR NEGATIVE NEGATIVE Final    Comment: (NOTE) SARS-CoV-2 target nucleic acids are NOT DETECTED.  The SARS-CoV-2 RNA is generally detectable in upper respiratory specimens during the acute phase of infection. The lowest concentration of SARS-CoV-2 viral copies this assay can detect is 138 copies/mL. A negative result does not  preclude SARS-Cov-2 infection and should not be used as the sole basis for treatment or other patient management decisions. A negative result may occur with  improper specimen collection/handling, submission of specimen other than nasopharyngeal swab, presence of viral mutation(s) within the areas targeted by this assay, and inadequate number of viral copies(<138 copies/mL). A negative result must be combined with clinical observations, patient history, and epidemiological information. The expected result is Negative.  Fact Sheet for Patients:  EntrepreneurPulse.com.au  Fact Sheet for Healthcare Providers:  IncredibleEmployment.be  This test is no t yet approved or cleared by the [place] FDA and  has been authorized for detection and/or diagnosis of SARS-CoV-2 by FDA under an Emergency Use Authorization (EUA). This EUA will remain  in effect (meaning this test can be used) for the duration of the COVID-19 declaration under Section 564(b)(1) of the Act, 21 U.S.C.section 360bbb-3(b)(1), unless the authorization is terminated  or revoked sooner.       Influenza A by PCR NEGATIVE NEGATIVE Final   Influenza B by PCR NEGATIVE NEGATIVE Final    Comment: (NOTE) The Xpert Xpress SARS-CoV-2/FLU/RSV plus assay is intended as an aid in the diagnosis of influenza from Nasopharyngeal swab specimens and should not be used as a sole basis for treatment. Nasal washings and aspirates are unacceptable for Xpert Xpress SARS-CoV-2/FLU/RSV testing.  Fact Sheet for Patients: EntrepreneurPulse.com.au  Fact Sheet for Healthcare Providers: IncredibleEmployment.be  This test is not yet approved or cleared by the [place] FDA and has been authorized for detection and/or diagnosis of SARS-CoV-2 by FDA under an Emergency Use Authorization (EUA). This EUA will remain in effect (meaning this test can be used) for the  duration of the COVID-19 declaration under Section 564(b)(1) of the Act, 21 U.S.C. section 360bbb-3(b)(1), unless the authorization is terminated or revoked.  Performed at [facility], [address], [place], [place] [address]   MRSA Next Gen by PCR, Nasal     Status: None   Collection Time: 08/29/21 10:40 AM   Specimen: Nasal Mucosa; Nasal Swab  Result Value Ref Range Status   MRSA by PCR Next Gen NOT DETECTED NOT DETECTED Final    Comment: (NOTE) The GeneXpert MRSA Assay (FDA approved for NASAL specimens only), is one component of a comprehensive MRSA colonization surveillance program. It is not intended to diagnose MRSA infection nor to guide or monitor treatment for MRSA infections. Test performance is not FDA approved in patients less than 23 years old. Performed at [facility], [address] [place], [place] [address]   Culture, blood (Routine X 2) w Reflex to ID Panel     Status: None (Preliminary result)   Collection Time: 09/04/21  5:07 PM   Specimen: BLOOD RIGHT ARM  Result Value Ref Range Status   Specimen Description BLOOD RIGHT ARM BOTTLES DRAWN AEROBIC ONLY  Final   Special Requests   Final    Blood Culture results may not be optimal due to an inadequate volume of blood received in culture bottles   Culture   Final    NO GROWTH < 24 HOURS Performed  at [facility], [address] [place], [place] [address]    Report Status PENDING  Incomplete  Culture, blood (Routine X 2) w Reflex to ID Panel     Status: None (Preliminary result)   Collection Time: 09/04/21  5:45 PM   Specimen: BLOOD RIGHT ARM  Result Value Ref Range Status   Specimen Description   Final    BLOOD RIGHT ARM BOTTLES DRAWN AEROBIC AND ANAEROBIC   Special Requests Blood Culture adequate volume  Final   Culture   Final    NO GROWTH < 24 HOURS Performed at [facility], [address] [place], [place] [address]    Report Status PENDING  Incomplete   Time spent: 37 minutes  Signed: Roxan Hockey, MD 01-Oct-2021

## 2021-09-24 NOTE — Consult Note (Signed)
? ?  NAME:  Jimmy Tucker, MRN:  [id number], DOB:  14-Sep-1960, LOS: 8 ?ADMISSION DATE:  09/06/2021, CONSULTATION DATE:  27-Sep-2021 ?REFERRING MD:  Denton Brick , CHIEF COMPLAINT:  vent dep post op   ? ?History of Present Illness:  ? 53  yowm active smoker with medical history significant for esophageal cancer, hypertension, coronary artery disease, atrial fibrillation, COPD, alcohol abuse. ?Patient was brought to the ED from home with reports of multiple complaints x several days PTA = confusion/ weakness since patient had his esophageal dilatation 2/22,  poor oral intake, has barely eaten,  patient unable to stand over the past few days PAT >  cannot walk.    ? ?W/u c.w  Afib with RVR, severe es dysfunction >  jejunostomy feeding tube placed  09/15/2021 ? ?  ? ?Significant Hospital Events: ?Including procedures, antibiotic start and stop dates in addition to other pertinent events   ?Jejunostomy 3/10 and ET remained with ? asp ?NO CPR  3/12 ?Sepsis criteria 3/12 ?Full NCB  3/13  ? ? ?Scheduled Meds: ? amiodarone  200 mg Per Tube Daily  ? vitamin C  500 mg Per Tube Daily  ? chlorhexidine gluconate (MEDLINE KIT)  15 mL Mouth Rinse BID  ? Chlorhexidine Gluconate Cloth  6 each Topical Q0600  ? dextrose      ? dextrose      ? ipratropium-albuterol  3 mL Nebulization QID  ? mouth rinse  15 mL Mouth Rinse 10 times per day  ? pantoprazole (PROTONIX) IV  40 mg Intravenous Q12H  ? polyethylene glycol  17 g Per Tube Daily  ? sodium polystyrene  45 g Per Tube Once  ? thiamine injection  100 mg Intravenous Daily  ? ?Continuous Infusions: ? sodium chloride Stopped (09/04/21 1545)  ? ampicillin-sulbactam (UNASYN) IV Stopped (09-27-21 0709)  ? calcium gluconate    ? fentaNYL infusion INTRAVENOUS Stopped (27-Sep-2021 0931)  ? midazolam Stopped (09-27-2021 0930)  ? norepinephrine (LEVOPHED) Adult infusion 30 mcg/min (2021/09/27 1011)  ? sodium bicarbonate 150 mEq in D5W infusion    ? vasopressin 0.03 Units/min (Sep 27, 2021 0941)  ? ?PRN Meds:.acetaminophen  **OR** acetaminophen, fentaNYL (SUBLIMAZE) injection, hydrALAZINE, HYDROmorphone (DILAUDID) injection, ipratropium-albuterol, midazolam, midazolam, ondansetron **OR** ondansetron (ZOFRAN) IV, polyethylene glycol  ?Interim History / Subjective:  ?All pressors d/c'd, no longer able to detect bp ? ?Objective   ?Blood pressure (!) 65/49, pulse (!) 30, temperature 97.9 ?F (36.6 ?C), temperature source Axillary, resp. rate (!) 21, height _0  (1.676 m), weight 49.6 kg, SpO2 91 %. ?   ?Vent Mode: PRVC ?FiO2 (%):  [40 %-100 %] 60 % ?Set Rate:  [16 bmp] 16 bmp ?Vt Set:  [400 mL] 400 mL ?PEEP:  [5 cmH20] 5 cmH20 ?Plateau Pressure:  [24 cmH20-26 cmH20] 26 cmH20  ? ?Intake/Output Summary (Last 24 hours) at 09-27-21 1033 ?Last data filed at 27-Sep-2021 [phone] ?Gross per 24 hour  ?Intake 6307.2 ml  ?Output 850 ml  ?Net 5457.2 ml  ? ?Filed Weights  ? 09/10/2021 0933 09/16/2021 1010 09/03/21 0600  ?Weight: 49.9 kg 49.9 kg 49.6 kg  ? ? ?Examination: ?Comfort measures in place  ? ?Imp vent dep / asp complicated by septic shock in pt with es ca/stricture ? ?Rec:  agree with comfort measures, death imminent so no formal pccm input needed.  ? ? ?Christinia Gully, MD ?Pulmonary and Critical Care Medicine ?[place] ?Cell [phone]  ? ?After 7:00 pm call Elink  [phone]  ?   ?  ?

## 2021-09-24 NOTE — Progress Notes (Signed)
3 Days Post-Op  ?Subjective: ?Intubated.  Events of the past 24 hours reviewed ? ?Objective: ?Vital signs in last 24 hours: ?Temp:  [97.7 ?F (36.5 ?C)-102.8 ?F (39.3 ?C)] 97.9 ?F (36.6 ?C) (03/13 2774) ?Pulse Rate:  [25-106] 26 (03/13 1015) ?Resp:  [17-38] 19 (03/13 1045) ?BP: (55-191)/(31-98) 121/78 (03/13 1045) ?SpO2:  [89 %-100 %] 92 % (03/13 1015) ?FiO2 (%):  [40 %-100 %] 60 % (03/13 0926) ?Last BM Date : 2021-10-04 ? ?Intake/Output from previous day: ?03/12 0701 - 03/13 0700 ?In: 6131.6 [I.V.:3097.9; IV Piggyback:2833.7] ?Out: 50 [Urine:50] ?Intake/Output this shift: ?Total I/O ?In: 1287 [I.V.:771; Other:170; IV Piggyback:100] ?Out: 850 [Urine:150; Other:700] ? ?GI: Incision healing well.  Patient is distended.  Minimal bowel sounds appreciated. ? ?Lab Results:  ?Recent Labs  ?  09/04/21 ?0358 09/04/21 ?1400 04-Oct-2021 ?0432  ?WBC 6.6  --  14.3*  ?HGB 8.7* 9.4* 7.6*  ?HCT 24.7* 27.0* 23.9*  ?PLT 113*  --  134*  ? ?BMET ?Recent Labs  ?  10-04-2021 ?0432 2021-10-04 ?0912  ?NA 117* 124*  ?K 5.6* 6.2*  ?CL 97* 101  ?CO2 12* 8*  ?GLUCOSE 343* 107*  ?BUN 21* 22*  ?CREATININE 1.35* 1.34*  ?CALCIUM 7.2* 7.7*  ? ?PT/INR ?No results for input(s): LABPROT, INR in the last 72 hours. ? ?Studies/Results: ?DG Chest Port 1 View ? ?Result Date: 09/04/2021 ?CLINICAL DATA:  Dyspnea, respiratory abnormalities EXAM: PORTABLE CHEST 1 VIEW COMPARISON:  Portable exam 1806 hours compared to 09/03/2021 FINDINGS: Tip of endotracheal tube projects 1.9 cm above carina. RIGHT arm PICC line tip projects over SVC. Stable heart size and mediastinal contours. Increased consolidation involving RIGHT middle and RIGHT lower lobes Atelectasis and small trait at medial LEFT lung base again seen. Suspect underlying emphysematous changes. No definite pleural effusion or pneumothorax. Bones demineralized. Atherosclerotic calcification aorta. IMPRESSION: Increased consolidation of RIGHT middle and RIGHT lower lobes as well as medial LEFT lower lobe. Aortic  Atherosclerosis (ICD10-I70.0) and Emphysema (ICD10-J43.9). Electronically Signed   By: Lavonia Dana M.D.   On: 09/04/2021 19:16  ? ?DG CHEST PORT 1 VIEW ? ?Result Date: 09/03/2021 ?CLINICAL DATA:  Difficulty breathing EXAM: PORTABLE CHEST 1 VIEW COMPARISON:  09/08/2021 FINDINGS: There is new infiltrate in the right lower lung fields. There are small linear densities in the left lower lung fields. There is gas in the posterior mediastinum, most likely in the hiatal hernia or distended esophagus. There is blunting of right lateral CP angle suggesting small effusion. There is no pneumothorax. Tip of endotracheal tube is 3.6 cm above the carina. IMPRESSION: New infiltrates are seen in the lower lung fields, more so on the right side suggesting pneumonia or aspiration. Electronically Signed   By: Elmer Picker M.D.   On: 09/03/2021 14:29  ? ?DG ABD ACUTE 2+V W 1V CHEST ? ?Result Date: 09/03/2021 ?CLINICAL DATA:  Dyspnea and respiratory abnormalities. EXAM: DG ABDOMEN ACUTE WITH 1 VIEW CHEST COMPARISON:  Chest radiograph on 09/06/2021 FINDINGS: Heart size remains normal. Aortic atherosclerotic calcification noted. Increased small right pleural effusion is seen since prior study. Mild atelectasis or infiltrate seen in the right lung base. Left lung remains clear. Midline abdominal skin staples are seen. Surgical drain is seen in the left abdomen. Gas-filled small bowel is seen, however there is no evidence of dilated bowel loops. IMPRESSION: Increased small right pleural effusion. Mild right basilar atelectasis versus infiltrate. Nonobstructive bowel gas pattern, with surgical drain in left abdomen. Electronically Signed   By: Myles Rosenthal.D.  On: 09/03/2021 13:19  ? ?[place] EKG SITE RITE ? ?Result Date: 09/03/2021 ?If [employer] not attached, placement could not be confirmed due to current cardiac rhythm.  ? ?Anti-infectives: ?Anti-infectives (From admission, onward)  ? ? Start     Dose/Rate Route Frequency Ordered  Stop  ? 09/03/21 1730  Ampicillin-Sulbactam (UNASYN) 3 g in sodium chloride 0.9 % 100 mL IVPB       ? 3 g ?200 mL/hr over 30 Minutes Intravenous Every 6 hours 09/03/21 1650    ? 09/09/2021 1002  ceFAZolin (ANCEF) 2-4 GM/100ML-% IVPB       ?Note to Pharmacy: Abbie Sons S: cabinet override  ?    09/03/2021 1002 09/18/2021 1204  ? 08/29/2021 0600  ceFAZolin (ANCEF) IVPB 2g/100 mL premix       ? 2 g ?200 mL/hr over 30 Minutes Intravenous On call to O.R. 09/01/21 2225 08/27/2021 1200  ? ?  ? ? ?Assessment/Plan: ?s/p Procedure(s): ?JEJUNOSTOMY ?Impression: Worsening multiple organ failure secondary to aspiration pneumonia.  Patient appears to becoming more acidotic with renal failure.  Maximizing pressors.  This was discussed with the family.  Prognosis is grave. ? LOS: 8 days  ? ? ?Aviva Signs ?Sep 16, 2021  ?

## 2021-09-24 NOTE — Progress Notes (Signed)
Nutrition Brief Note  Chart reviewed. Pt now transitioning to comfort care.  No further nutrition interventions planned at this time.  Please re-consult as needed.   Allie Derrian Poli, RDN, LDN Clinical Nutrition  

## 2021-09-24 NOTE — Progress Notes (Signed)
BMP this morning showed sodium level to be at 117, this was 128 yesterday (3/12).  It was noted that patient was on D5 NS and the blood glucose level was 343.  The IV fluid was elevated.  Patient will be started on IV NS.  Continue to monitor sodium levels with serial BMPs ?

## 2021-09-24 NOTE — Anesthesia Postprocedure Evaluation (Signed)
Anesthesia Post Note ? ?Patient: Jimmy Tucker ? ?Procedure(s) Performed: JEJUNOSTOMY (Abdomen) ? ?Patient location during evaluation: Phase II ?Anesthesia Type: General ?Level of consciousness: awake ?Pain management: pain level controlled ?Vital Signs Assessment: post-procedure vital signs reviewed and stable ?Respiratory status: spontaneous breathing and respiratory function stable ?Cardiovascular status: blood pressure returned to baseline and stable ?Postop Assessment: no headache and no apparent nausea or vomiting ?Anesthetic complications: no ?Comments: Late entry ? ? ?No notable events documented. ? ? ?Last Vitals:  ?Vitals:  ? 2021-09-10 0734 09/10/21 0745  ?BP:  (!) 191/73  ?Pulse: 83 84  ?Resp: 18 19  ?Temp:    ?SpO2: 92% 93%  ?  ?Last Pain:  ?Vitals:  ? 09/04/21 2200  ?TempSrc: Axillary  ?PainSc:   ? ? ?  ?  ?  ?  ?  ?  ? ?Louann Sjogren ? ? ? ? ?

## 2021-09-24 NOTE — Progress Notes (Signed)
Pt passed at 1309. Verified by two RN's. Dr. Joesph Fillers made aware. Pt was surrounded by multiple family members when he passed, as well as the Chaplain.  ?

## 2021-09-24 DEATH — deceased

## 2021-09-27 ENCOUNTER — Ambulatory Visit (HOSPITAL_COMMUNITY): Admit: 2021-09-27 | Payer: Medicare Other | Admitting: Gastroenterology

## 2021-09-27 SURGERY — ESOPHAGOGASTRODUODENOSCOPY (EGD) WITH PROPOFOL
Anesthesia: Monitor Anesthesia Care

## 2021-11-15 IMAGING — DX DG CHEST 1V PORT
1 series · 1 of 1 positions shown · non-contrast
Comparison: 02/12/2015

CLINICAL DATA: Hypertension

EXAM:
PORTABLE CHEST 1 VIEW

[chest ap]
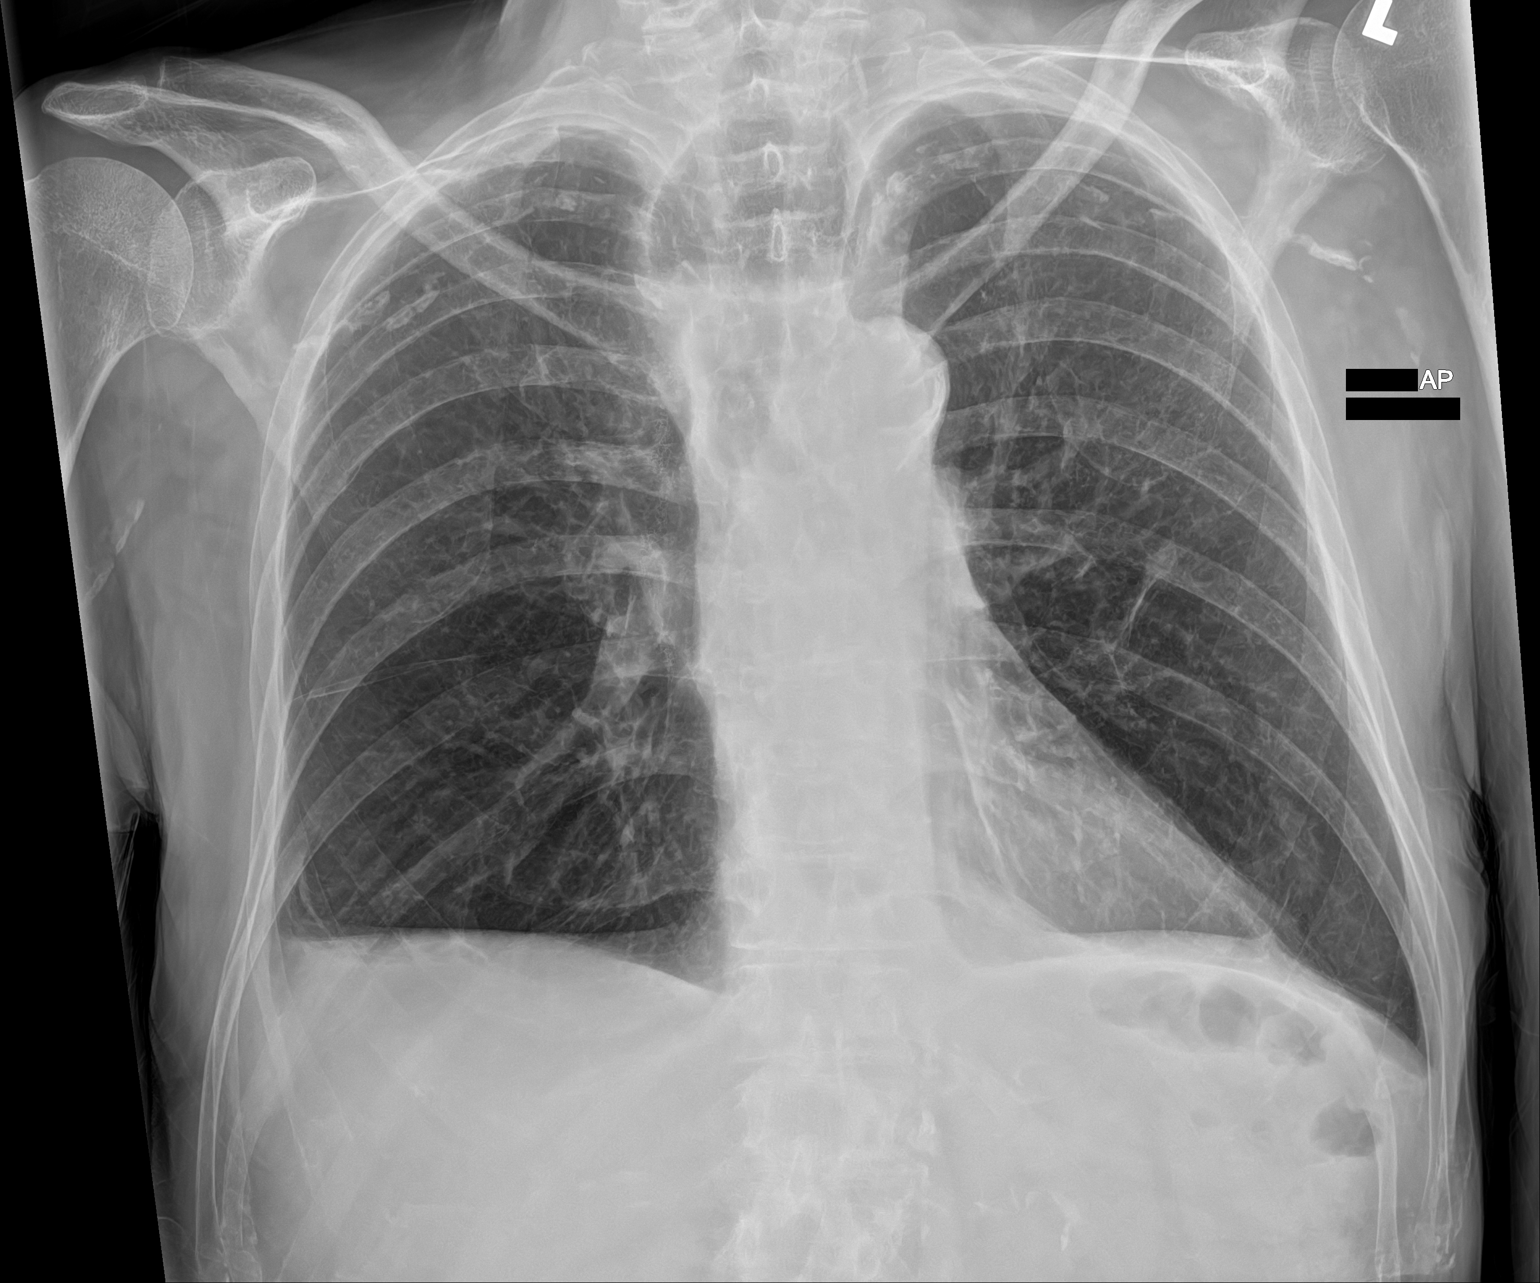

[1 of 1 positions shown; findings below may reference images not displayed]

FINDINGS: Small bilateral pleural effusions. No focal consolidation. Normal
cardiac size. Aortic atherosclerosis. No pneumothorax. Probable air
distension of esophagus with fluid level.
IMPRESSION: 1. Small bilateral effusions.
2. Suspected air distension of esophagus, with possible fluid level

## 2022-05-10 IMAGING — CT CT ANGIO CHEST
2 of 7 series · 18 of 46 positions shown · IV contrast (Omnipaque or Isovue)
Comparison: 10/20/2014

CLINICAL DATA: PE suspected, shortness of breath, confusion,
history of esophageal cancer

EXAM:
CT ANGIOGRAPHY CHEST WITH CONTRAST
TECHNIQUE: Multidetector CT imaging of the chest was performed using the
standard protocol during bolus administration of intravenous
contrast. Multiplanar CT image reconstructions and MIPs were
obtained to evaluate the vascular anatomy.

[Series 5: pe axial thins · axial · 0.77mm/px · z∈[-467,-217]mm · 15 of 353 slices shown]
[im 20/353  lung]
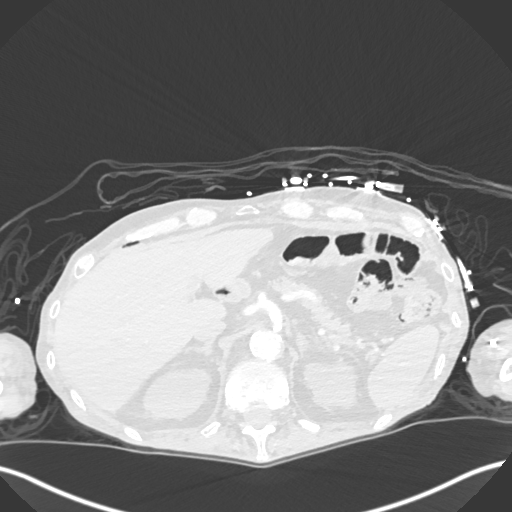
[im 40/353  soft-tissue]
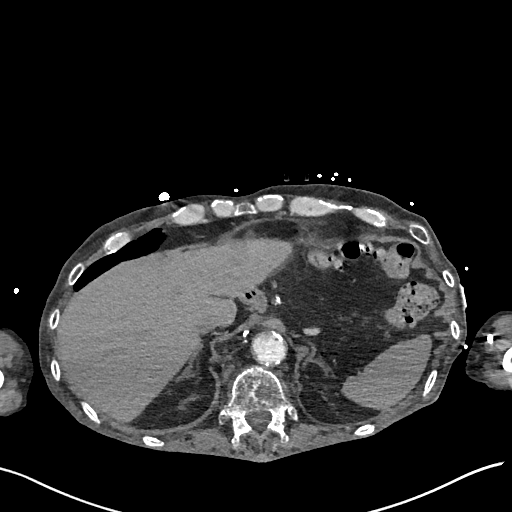
[im 59/353  lung]
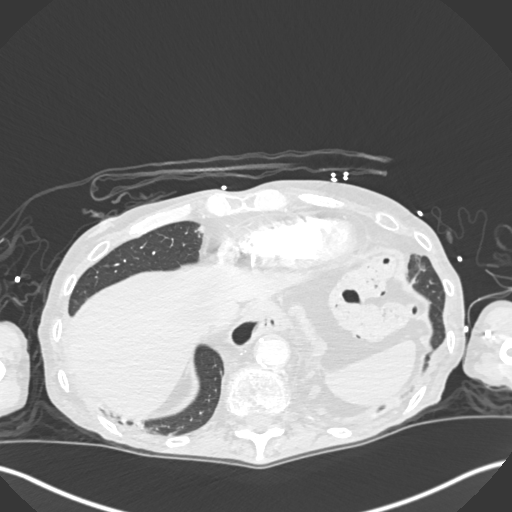
[im 79/353  soft-tissue]
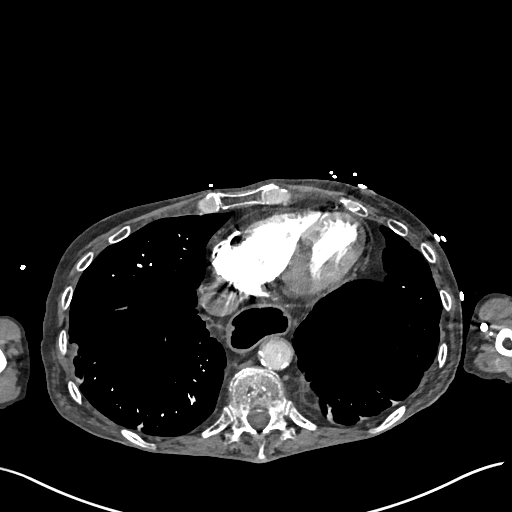
[im 118/353  lung]
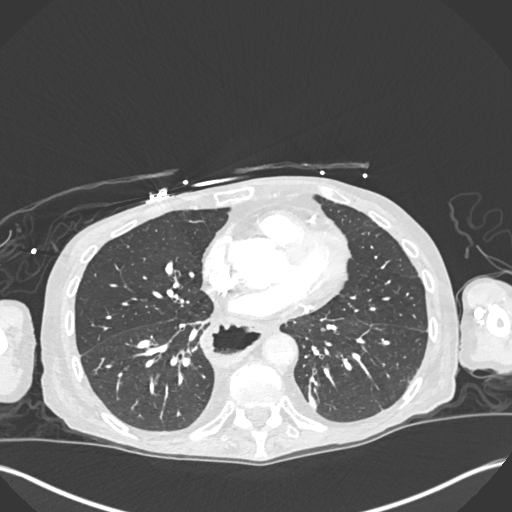
[im 137/353  soft-tissue]
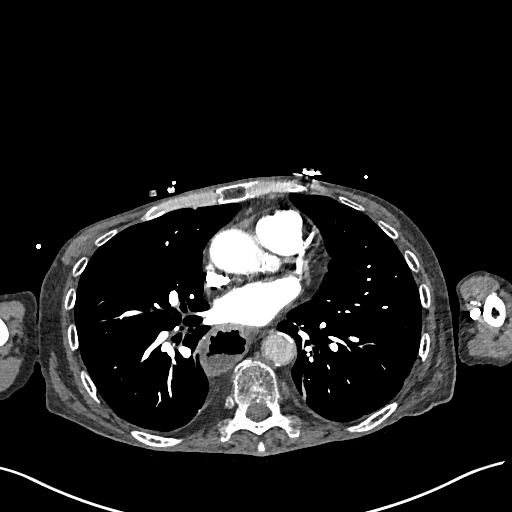
[im 157/353  lung]
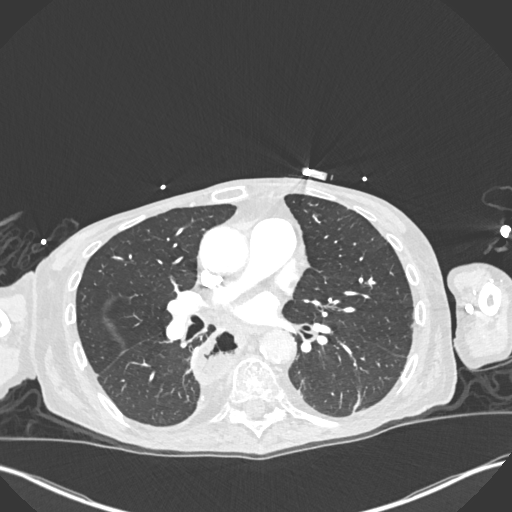
[im 177/353  soft-tissue]
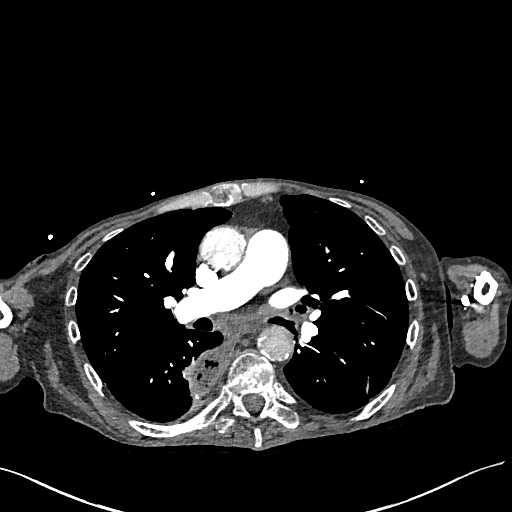
[im 196/353  lung]
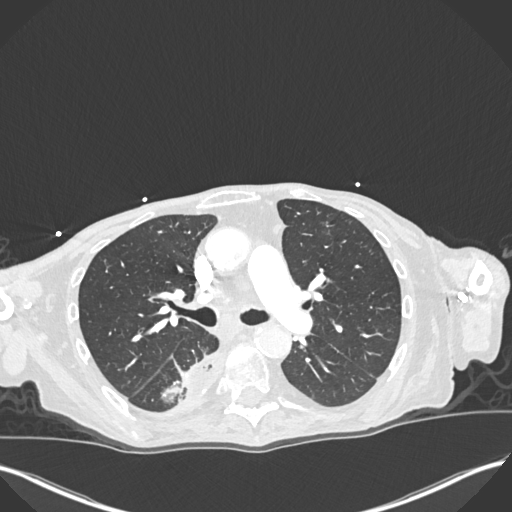
[im 216/353  soft-tissue]
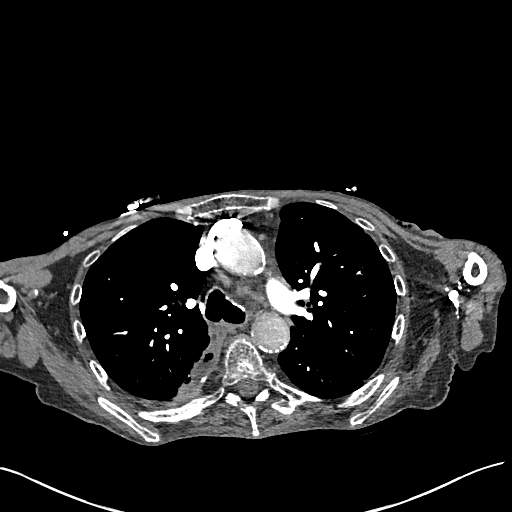
[im 235/353  lung]
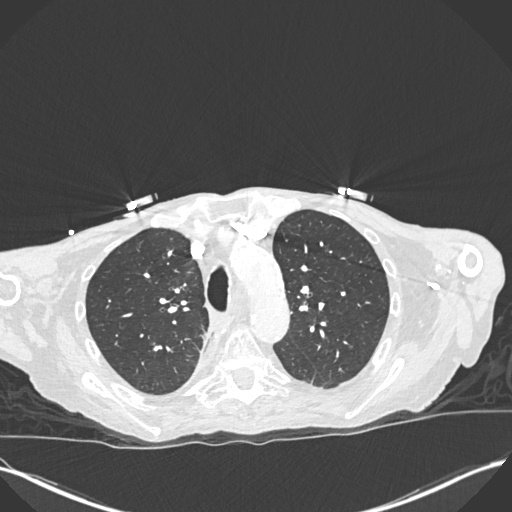
[im 274/353  soft-tissue]
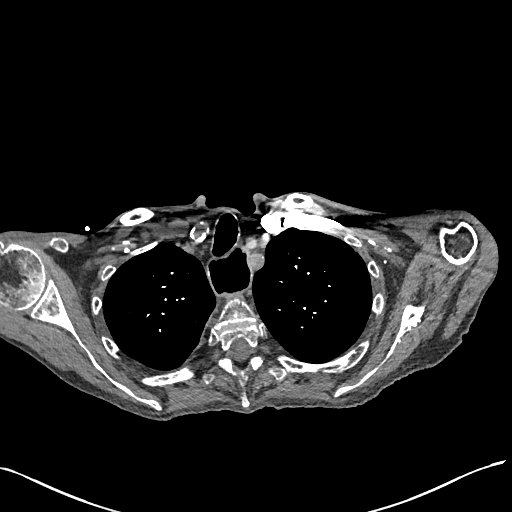
[im 294/353  lung]
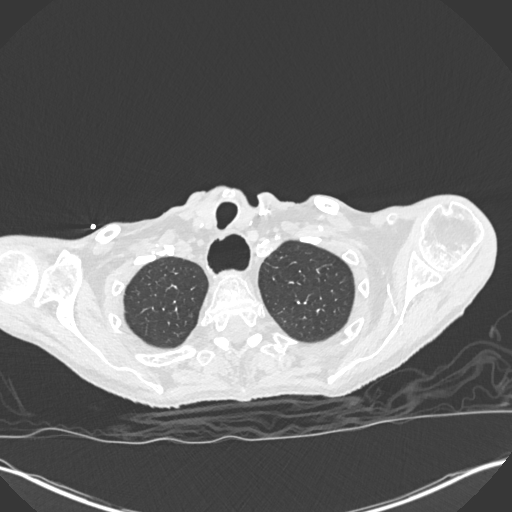
[im 313/353  soft-tissue]
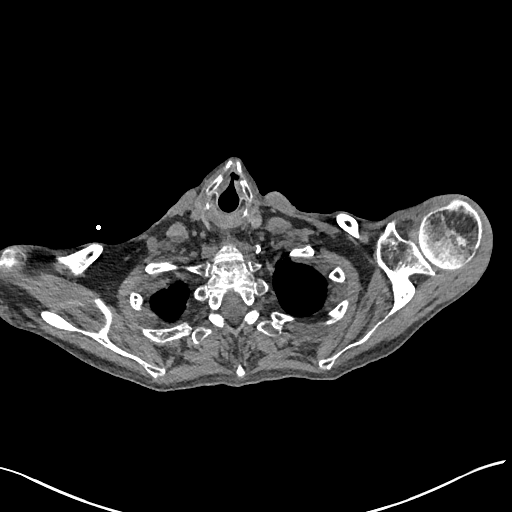
[im 333/353  lung]
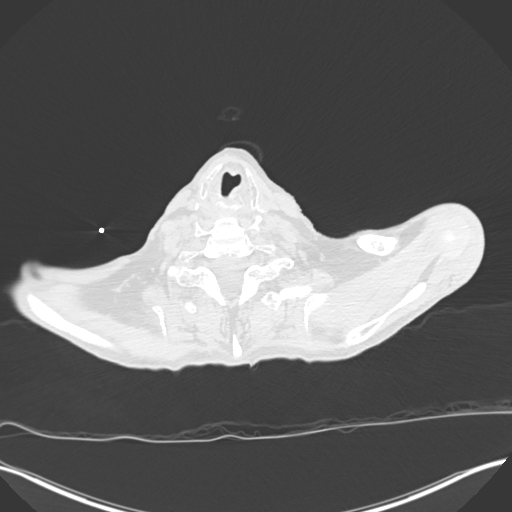

[Series 8: cor soft · coronal · 0.62mm/px · 3 of 111 slices shown]
[im 28/111  soft-tissue]
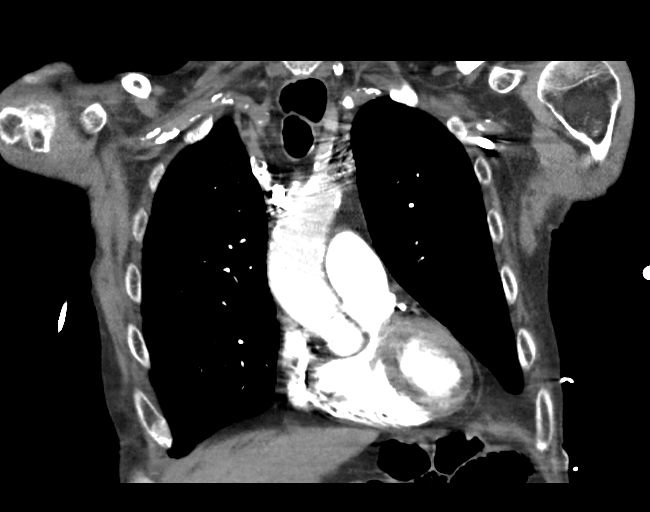
[im 56/111  soft-tissue]
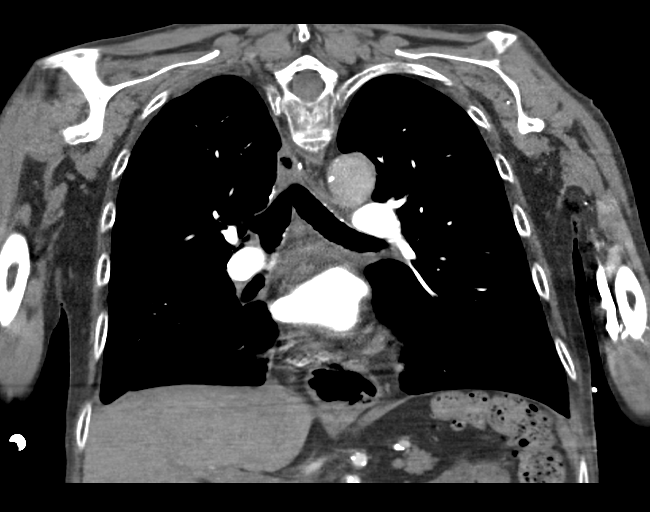
[im 83/111  soft-tissue]
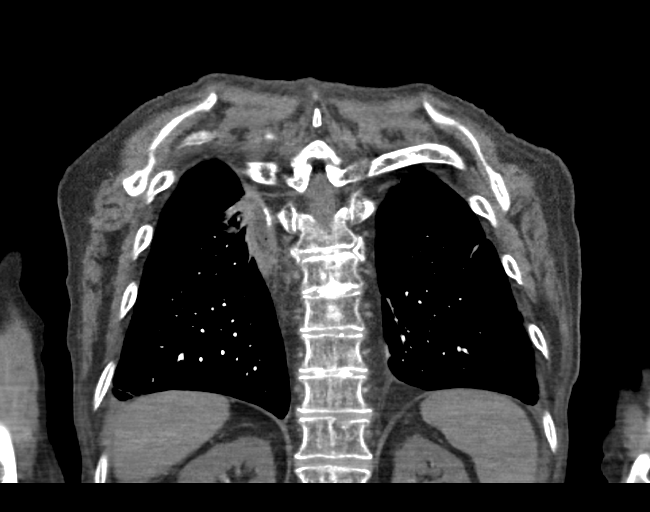

[18 of 46 positions shown; findings below may reference images not displayed]

RADIATION DOSE REDUCTION: This exam was performed according to the
departmental dose-optimization program which includes automated
exposure control, adjustment of the mA and/or kV according to
patient size and/or use of iterative reconstruction technique.

CONTRAST:  75mL OMNIPAQUE IOHEXOL 350 MG/ML SOLN
FINDINGS: Cardiovascular: Satisfactory opacification of the pulmonary arteries
to the segmental level. No evidence of pulmonary embolism. Normal
heart size. Extensive three-vessel coronary artery calcifications
and/or stents. No pericardial effusion. Aortic atherosclerosis

Mediastinum/Nodes: No enlarged mediastinal, hilar, or axillary lymph
nodes. Status post pull-through esophagectomy. Thyroid gland and
trachea demonstrate no significant findings.

Lungs/Pleura: Mild, diffuse bilateral bronchial wall thickening.
Bandlike scarring of the bilateral lung bases. No pleural effusion
or pneumothorax.

Upper Abdomen: No acute abnormality.

Musculoskeletal: No chest wall abnormality. No acute osseous
findings.

Review of the MIP images confirms the above findings.
IMPRESSION: 1. Negative examination for pulmonary embolism.
2. Mild, diffuse bilateral bronchial wall thickening, consistent
with nonspecific infectious or inflammatory bronchitis.
3. Status post pull-through esophagectomy. No evidence of malignant
recurrence in the chest.
4. Coronary artery disease.

Aortic Atherosclerosis ([id number]).

## 2022-05-16 IMAGING — DX DG ABDOMEN ACUTE W/ 1V CHEST
2 series · 2 of 2 positions shown · non-contrast
Comparison: Chest radiograph on 08/28/2021

CLINICAL DATA: Dyspnea and respiratory abnormalities.

EXAM:
DG ABDOMEN ACUTE WITH 1 VIEW CHEST

[abdomen erect ap]
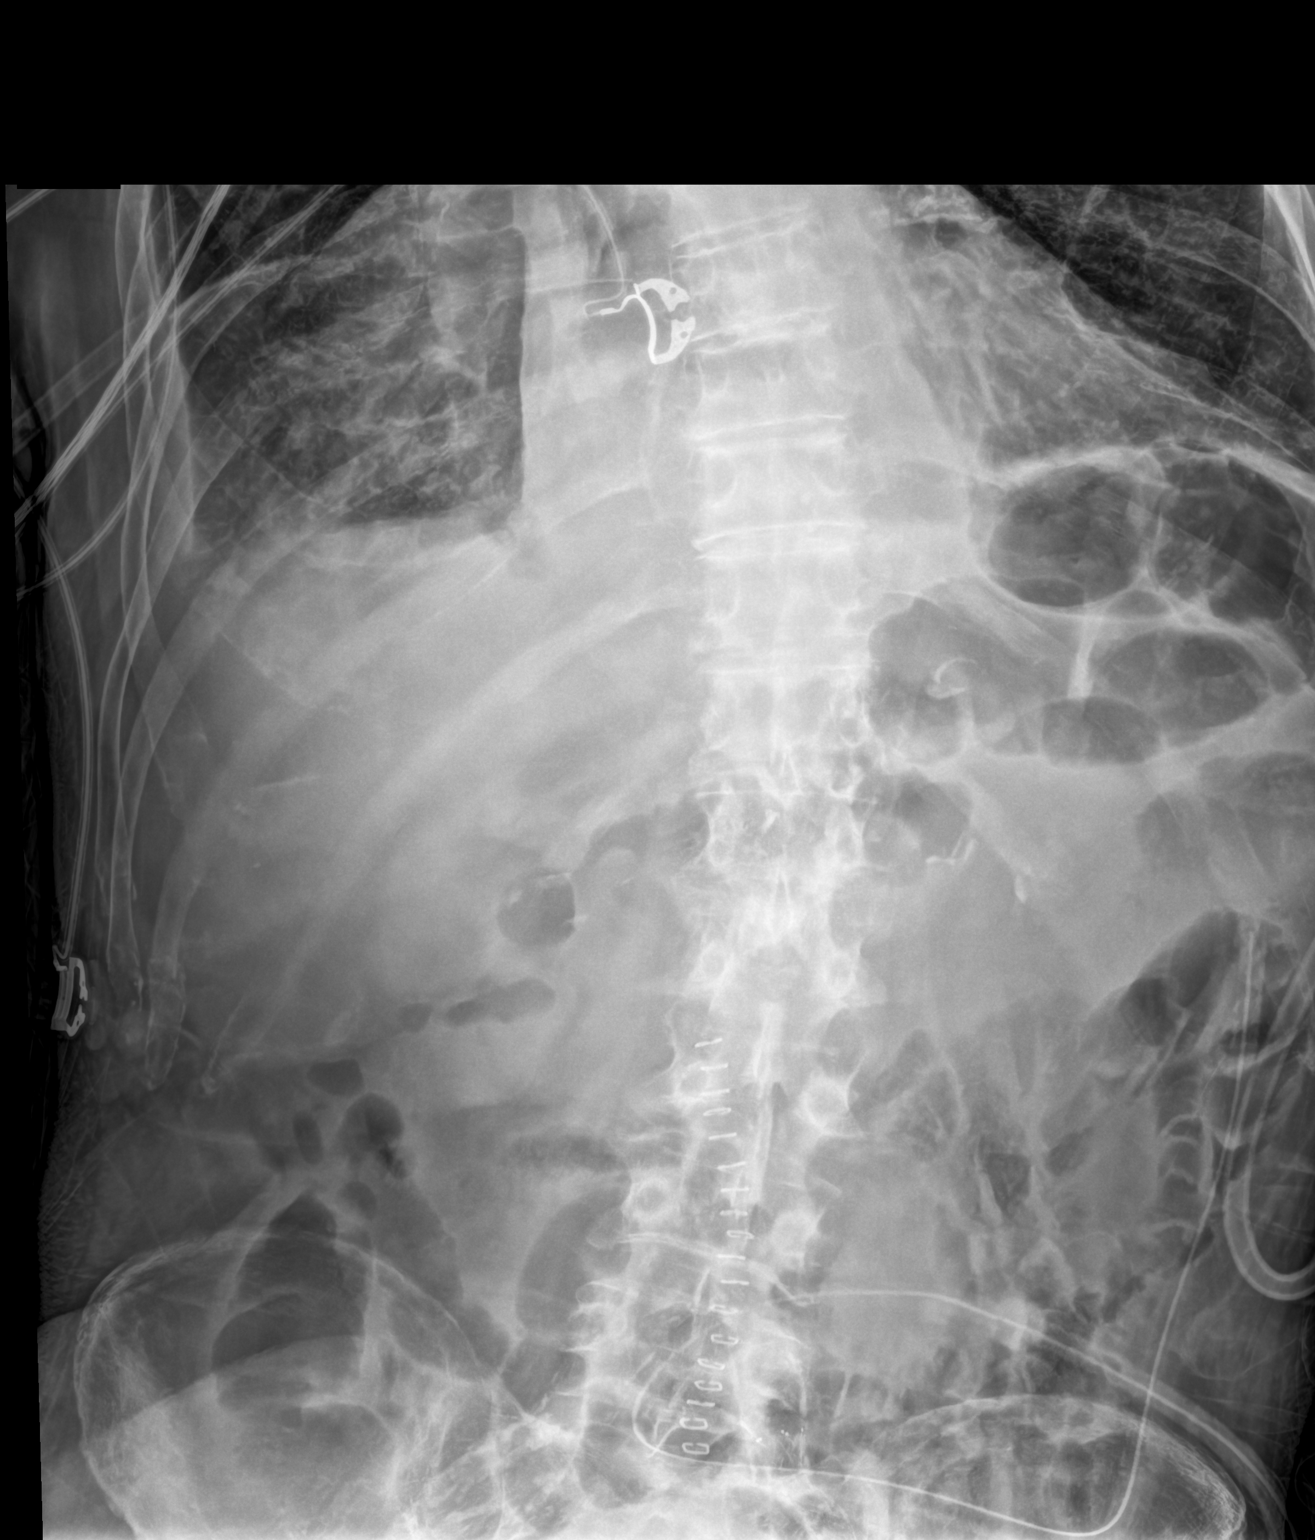

[chest ap grid]
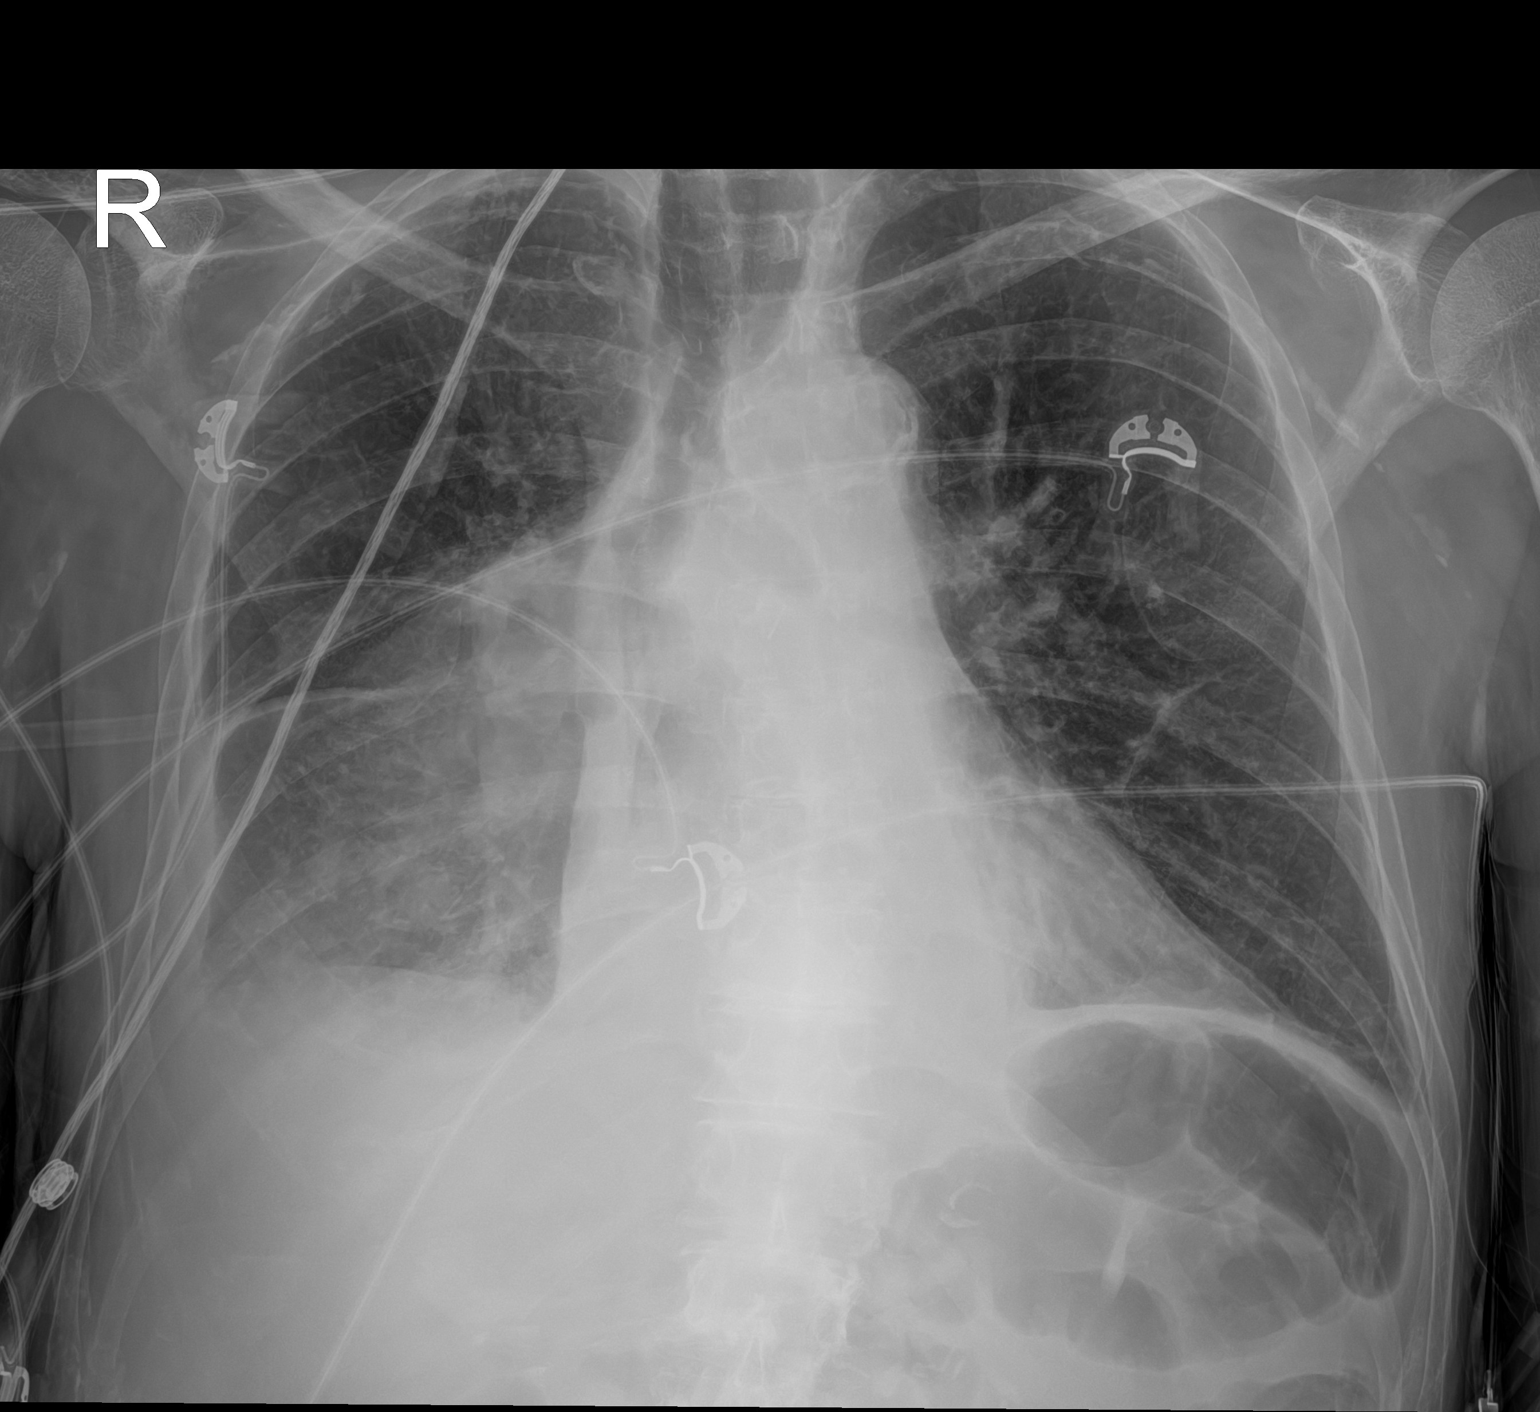

[2 of 2 positions shown; findings below may reference images not displayed]

FINDINGS: Heart size remains normal. Aortic atherosclerotic calcification
noted.

Increased small right pleural effusion is seen since prior study.
Mild atelectasis or infiltrate seen in the right lung base. Left
lung remains clear.

Midline abdominal skin staples are seen. Surgical drain is seen in
the left abdomen. Gas-filled small bowel is seen, however there is
no evidence of dilated bowel loops.
IMPRESSION: Increased small right pleural effusion.

Mild right basilar atelectasis versus infiltrate.

Nonobstructive bowel gas pattern, with surgical drain in left
abdomen.
